# Patient Record
Sex: Male | Born: 1940 | Race: White | Hispanic: No | Smoking: Never smoker
Health system: Southern US, Community
[De-identification: ages and names within clinical notes are randomized; demographics above are authoritative.]

## PROBLEM LIST (undated history)

## (undated) DIAGNOSIS — R64 Cachexia: Secondary | ICD-10-CM

## (undated) DIAGNOSIS — I350 Nonrheumatic aortic (valve) stenosis: Secondary | ICD-10-CM

## (undated) DIAGNOSIS — R339 Retention of urine, unspecified: Secondary | ICD-10-CM

## (undated) DIAGNOSIS — E860 Dehydration: Secondary | ICD-10-CM

---

## 2000-07-12 ENCOUNTER — Emergency Department (HOSPITAL_COMMUNITY): Admission: EM | Admit: 2000-07-12 | Discharge: 2000-07-12 | Payer: Self-pay | Admitting: Emergency Medicine

## 2020-12-23 ENCOUNTER — Emergency Department (HOSPITAL_COMMUNITY)
Admission: EM | Admit: 2020-12-23 | Discharge: 2020-12-23 | Disposition: A | Discharge status: Home / Self Care | Payer: Self-pay | Attending: Emergency Medicine | Admitting: Emergency Medicine

## 2020-12-23 DIAGNOSIS — R404 Transient alteration of awareness: Secondary | ICD-10-CM | POA: Insufficient documentation

## 2020-12-23 DIAGNOSIS — R410 Disorientation, unspecified: Secondary | ICD-10-CM | POA: Insufficient documentation

## 2020-12-23 NOTE — ED Provider Notes (Signed)
[facility] EMERGENCY DEPARTMENT Provider Note   CSN: [id number] Arrival date & time: 12/23/20  1103     History Chief Complaint  Patient presents with   Altered Mental Status    Dominic Smith is a 80 y.o. male.  HPI Patient was brought here by EMS after he was found in the parking lot at a local store, confused, and looking for his car keys.  He states he lives alone, and drove to the [place] today.  He is not sure why he went there.  He denies headache, chest pain, shortness of breath, nausea, vomiting, weakness or dizziness.  There are no other known active modifying factors.    No past medical history on file.  There are no problems to display for this patient.     No family history on file.     Home Medications Prior to Admission medications   Not on File    Allergies    Patient has no allergy information on record.  Review of Systems   Review of Systems  All other systems reviewed and are negative.  Physical Exam Updated Vital Signs BP (!) 157/89   Pulse 89   Temp 98 F (36.7 C) (Oral)   Resp 11   SpO2 100%   Physical Exam Vitals and nursing note reviewed.  Constitutional:      General: He is not in acute distress.    Appearance: He is well-developed. He is not ill-appearing or diaphoretic.  HENT:     Head: Normocephalic and atraumatic.     Right Ear: External ear normal.     Left Ear: External ear normal.  Eyes:     Conjunctiva/sclera: Conjunctivae normal.     Pupils: Pupils are equal, round, and reactive to light.  Neck:     Trachea: Phonation normal.  Cardiovascular:     Rate and Rhythm: Normal rate and regular rhythm.     Heart sounds: Normal heart sounds.  Pulmonary:     Effort: Pulmonary effort is normal.     Breath sounds: Normal breath sounds.  Abdominal:     General: There is no distension.     Palpations: Abdomen is soft.     Tenderness: There is no abdominal tenderness.  Musculoskeletal:        General:  Normal range of motion.     Cervical back: Normal range of motion and neck supple.  Skin:    General: Skin is warm and dry.  Neurological:     Mental Status: He is alert and oriented to person, place, and time.     Cranial Nerves: No cranial nerve deficit.     Sensory: No sensory deficit.     Motor: No abnormal muscle tone.     Coordination: Coordination normal.     Comments: Alert, lucid, no dysarthria or aphasia.  Somewhat poor historian.  He could not recall the name of the store where he was when he was found by EMS.  With prompting he was able to remember the name.  Psychiatric:        Mood and Affect: Mood normal.        Behavior: Behavior normal.    ED Results / Procedures / Treatments   Labs (all labs ordered are listed, but only abnormal results are displayed) Labs Reviewed - No data to display  EKG EKG Interpretation  Date/Time:  Wednesday December 23 2020 11:15:53 EDT Ventricular Rate:  102 PR Interval:  207 QRS Duration: 92 QT  Interval:  350 QTC Calculation: 456 R Axis:   51 Text Interpretation: Sinus tachycardia Borderline prolonged PR interval No previous ECGs available Confirmed by Mancel Bale [phone]) on 12/23/2020 11:20:17 AM  Radiology No results found.  Procedures Procedures   Medications Ordered in ED Medications - No data to display  ED Course  I have reviewed the triage vital signs and the nursing notes.  Pertinent labs & imaging results that were available during my care of the patient were reviewed by me and considered in my medical decision making (see chart for details).    MDM Rules/Calculators/A&P                            Patient Vitals for the past 24 hrs:  BP Temp Temp src Pulse Resp SpO2  12/23/20 1345 (!) 157/89 98 F (36.7 C) Oral 89 11 100 %  12/23/20 1330 (!) 158/86 -- -- 88 13 100 %  12/23/20 1300 (!) 162/98 -- -- 94 19 100 %  12/23/20 1230 (!) 143/60 -- -- 91 16 100 %  12/23/20 1227 138/71 -- -- 93 20 100 %  12/23/20  1132 -- -- -- -- -- 100 %  12/23/20 1112 (!) 153/89 98 F (36.7 C) Oral (!) 101 19 100 %    1:54 PM Reevaluation with update and discussion. After initial assessment and treatment, an updated evaluation reveals status here now states the patient is at baseline.  Son is here now and states the patient is at his baseline.  Apparently, the patient locked his keys in the car which is why he could not find them.  Patient still does not have other concerns.  We discussed the findings and the plan. Mancel Bale   Medical Decision Making:  This patient is presenting for evaluation of a period of confusion, which does require a range of treatment options, and is a complaint that involves a moderate risk of morbidity and mortality. The differential diagnoses include early dementia, nonspecific confusional state, acute. I decided to review old records, and in summary elderly male, who does not have a PCP presents for confusion which is improving and mild in nature.  He did not require intervention by EMS, or on arrival to the ED.  Patient's son arrived and states he previously saw the patient get like this when he was outside in the heat.  Patient does have chronic ongoing medical problems or take medications.  I obtained additional historical information from son at the bedside.    Critical Interventions-clinical evaluation, nutrition offered and he was able to eat.  Discussion with son who arrived to take the patient home  After These Interventions, the Patient was reevaluated and was found stable, with transient confusion that resolved spontaneously.  Possible early dementia versus mild aging related confusion.  No indication for hospitalization at this time.  No evidence for unstable hemodynamic status or acute illness.  CRITICAL CARE-no Performed by: Mancel Bale  Nursing Notes Reviewed/ Care Coordinated Applicable Imaging Reviewed Interpretation of Laboratory Data incorporated into ED  treatment  The patient appears reasonably screened and/or stabilized for discharge and I doubt any other medical condition or other [facility] requiring further screening, evaluation, or treatment in the ED at this time prior to discharge.  Plan: Home Medications-OTC as needed; Home Treatments-regular diet, fluids and activity; return here if the recommended treatment, does not improve the symptoms; Recommended follow up-PCP, PR     Final Clinical Impression(s) /  ED Diagnoses Final diagnoses:  Transient alteration of awareness    Rx / DC Orders ED Discharge Orders     None        Mancel Bale, MD 12/23/20 1357

## 2020-12-23 NOTE — Discharge Instructions (Addendum)
There is not appear to be any sign of serious illness at this time.  Sometimes episodes like this can indicate the beginnings of cognitive decline.  Consider following up with a primary care doctor, for checkup and monitoring.  Otherwise try to eat regularly and drink plenty of fluids, and avoid being outside when it is hot.

## 2020-12-23 NOTE — ED Triage Notes (Signed)
Pt BIB EMS due to AMS. Pt was at [employer]  and was looking under his car for his keys. Bystander called 911. EMS called son and son stated that this is not his baseline.

## 2020-12-23 NOTE — ED Notes (Signed)
Son at bedside and has been updated.

## 2021-05-13 ENCOUNTER — Inpatient Hospital Stay (HOSPITAL_COMMUNITY)
Admission: EM | Admit: 2021-05-13 | Discharge: 2021-05-18 | DRG: 640 | Disposition: A | Discharge status: Home / Self Care | Payer: Medicare Other | Attending: Internal Medicine | Admitting: Internal Medicine

## 2021-05-13 ENCOUNTER — Other Ambulatory Visit: Payer: Self-pay

## 2021-05-13 DIAGNOSIS — Y92481 Parking lot as the place of occurrence of the external cause: Secondary | ICD-10-CM

## 2021-05-13 DIAGNOSIS — N179 Acute kidney failure, unspecified: Secondary | ICD-10-CM | POA: Diagnosis present

## 2021-05-13 DIAGNOSIS — B9620 Unspecified Escherichia coli [E. coli] as the cause of diseases classified elsewhere: Secondary | ICD-10-CM | POA: Diagnosis present

## 2021-05-13 DIAGNOSIS — N390 Urinary tract infection, site not specified: Secondary | ICD-10-CM | POA: Diagnosis present

## 2021-05-13 DIAGNOSIS — N3001 Acute cystitis with hematuria: Principal | ICD-10-CM

## 2021-05-13 DIAGNOSIS — S60512A Abrasion of left hand, initial encounter: Secondary | ICD-10-CM | POA: Diagnosis present

## 2021-05-13 DIAGNOSIS — R011 Cardiac murmur, unspecified: Secondary | ICD-10-CM | POA: Diagnosis present

## 2021-05-13 DIAGNOSIS — I350 Nonrheumatic aortic (valve) stenosis: Secondary | ICD-10-CM

## 2021-05-13 DIAGNOSIS — Z5902 Unsheltered homelessness: Secondary | ICD-10-CM

## 2021-05-13 DIAGNOSIS — N1832 Chronic kidney disease, stage 3b: Secondary | ICD-10-CM | POA: Diagnosis present

## 2021-05-13 DIAGNOSIS — R030 Elevated blood-pressure reading, without diagnosis of hypertension: Secondary | ICD-10-CM | POA: Diagnosis present

## 2021-05-13 DIAGNOSIS — R404 Transient alteration of awareness: Secondary | ICD-10-CM | POA: Diagnosis not present

## 2021-05-13 DIAGNOSIS — S0081XA Abrasion of other part of head, initial encounter: Secondary | ICD-10-CM | POA: Diagnosis present

## 2021-05-13 DIAGNOSIS — W1830XA Fall on same level, unspecified, initial encounter: Secondary | ICD-10-CM | POA: Diagnosis present

## 2021-05-13 DIAGNOSIS — E860 Dehydration: Secondary | ICD-10-CM | POA: Diagnosis present

## 2021-05-13 DIAGNOSIS — R52 Pain, unspecified: Secondary | ICD-10-CM

## 2021-05-13 DIAGNOSIS — G9341 Metabolic encephalopathy: Secondary | ICD-10-CM | POA: Diagnosis present

## 2021-05-13 DIAGNOSIS — Z20822 Contact with and (suspected) exposure to covid-19: Secondary | ICD-10-CM | POA: Diagnosis present

## 2021-05-13 DIAGNOSIS — Z79899 Other long term (current) drug therapy: Secondary | ICD-10-CM

## 2021-05-13 DIAGNOSIS — G9340 Encephalopathy, unspecified: Secondary | ICD-10-CM | POA: Diagnosis present

## 2021-05-13 NOTE — ED Triage Notes (Signed)
Pt BIB EMS c/o AMS. Per EMS pt was found in the floor at [employer] parking lot. Pt a/xo3.    BP 131 80   HR 70 RR 20 O2 99% on RA CBG 98

## 2021-05-13 NOTE — ED Notes (Signed)
Patient refused vital signs 

## 2021-05-13 NOTE — ED Notes (Signed)
Patient stated "I don't want to be here. That Officer and EMS just brought me here without reason, they take away my rights."

## 2021-05-13 NOTE — ED Provider Notes (Signed)
[facility] DEPT Provider Note   CSN: [id number] Arrival date & time: 05/13/21  2155     History  No chief complaint on file.   Dominic Smith is a 81 y.o. male.  HPI Patient is an 81 year old male who presents to the emergency department via EMS due to possible altered mental status.  Patient was found lying down in a [employer] parking lot.  There was concern that he was disoriented so they brought him to the emergency department.  He denies any physical complaints.  Denies any drug or alcohol use.  Denies any chest pain or shortness of breath.  No URI symptoms.  Ambulatory and requesting something to drink.  States that he is unsure why he was brought here and requests to go home.    Home Medications Prior to Admission medications   Not on File      Allergies    Patient has no allergy information on record.    Review of Systems   Review of Systems  All other systems reviewed and are negative. Ten systems reviewed and are negative for acute change, except as noted in the HPI.   Physical Exam Updated Vital Signs BP 138/76 (BP Location: Left Arm)    Pulse 93    Temp 97.6 F (36.4 C) (Oral)    Resp 16    Ht 6' (1.829 m)    Wt 72.6 kg    SpO2 98%    BMI 21.70 kg/m  Physical Exam Vitals and nursing note reviewed.  Constitutional:      General: He is not in acute distress.    Appearance: Normal appearance. He is not ill-appearing, toxic-appearing or diaphoretic.  HENT:     Head: Normocephalic.     Comments: Abrasion to the forehead.    Right Ear: External ear normal.     Left Ear: External ear normal.     Nose: Nose normal.     Mouth/Throat:     Mouth: Mucous membranes are moist.     Pharynx: Oropharynx is clear. No oropharyngeal exudate or posterior oropharyngeal erythema.  Eyes:     General: No scleral icterus.       Right eye: No discharge.        Left eye: No discharge.     Extraocular Movements: Extraocular movements intact.      Conjunctiva/sclera: Conjunctivae normal.  Cardiovascular:     Rate and Rhythm: Normal rate and regular rhythm.     Pulses: Normal pulses.     Heart sounds: Normal heart sounds. No murmur heard.   No friction rub. No gallop.     Comments: RRR without M/R/G. Pulmonary:     Effort: Pulmonary effort is normal. No respiratory distress.     Breath sounds: Normal breath sounds. No stridor. No wheezing, rhonchi or rales.     Comments: LCTAB. Abdominal:     General: Abdomen is flat.     Palpations: Abdomen is soft.     Tenderness: There is no abdominal tenderness.  Musculoskeletal:        General: Normal range of motion.     Cervical back: Normal range of motion and neck supple. No tenderness.  Skin:    General: Skin is warm and dry.  Neurological:     General: No focal deficit present.     Mental Status: He is alert.     Comments: A&O x1.  Unsure of location or the year.  Moving all 4 extremities.  No  gross deficits.  Ambulatory with a steady gait.  Psychiatric:        Mood and Affect: Mood normal.        Behavior: Behavior normal.   ED Results / Procedures / Treatments   Labs (all labs ordered are listed, but only abnormal results are displayed) Labs Reviewed  URINALYSIS, ROUTINE W REFLEX MICROSCOPIC - Abnormal; Notable for the following components:      Result Value   APPearance HAZY (*)    Hgb urine dipstick SMALL (*)    Ketones, ur 5 (*)    Nitrite POSITIVE (*)    Leukocytes,Ua LARGE (*)    WBC, UA >50 (*)    Bacteria, UA MANY (*)    All other components within normal limits  COMPREHENSIVE METABOLIC PANEL - Abnormal; Notable for the following components:   CO2 21 (*)    Glucose, Bld 103 (*)    BUN 51 (*)    Creatinine, Ser 1.47 (*)    AST 59 (*)    Total Bilirubin 1.3 (*)    GFR, Estimated 48 (*)    All other components within normal limits  RESP PANEL BY RT-PCR (FLU A&B, COVID) ARPGX2  URINE CULTURE  CBC WITH DIFFERENTIAL/PLATELET  RAPID URINE DRUG SCREEN, HOSP  PERFORMED   EKG None  Radiology CT HEAD WO CONTRAST (5MM)  Result Date: 05/14/2021 CLINICAL DATA:  Found on the ground at [employer] parking lot. EXAM: CT HEAD WITHOUT CONTRAST TECHNIQUE: Contiguous axial images were obtained from the base of the skull through the vertex without intravenous contrast. RADIATION DOSE REDUCTION: This exam was performed according to the departmental dose-optimization program which includes automated exposure control, adjustment of the mA and/or kV according to patient size and/or use of iterative reconstruction technique. COMPARISON:  None. FINDINGS: Brain: There is mild cerebral atrophy with widening of the extra-axial spaces and ventricular dilatation. There are areas of decreased attenuation within the white matter tracts of the supratentorial brain, consistent with microvascular disease changes. Vascular: No hyperdense vessel or unexpected calcification. Skull: Normal. Negative for fracture or focal lesion. Sinuses/Orbits: Chronic cortical thickening of the anterior, lateral and posterior walls of the left maxillary sinus are seen. Similar appearing changes are noted within the region of the frontal sinus on the left. Other: None. IMPRESSION: 1. No acute intracranial pathology. 2. 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 Electronically Signed   By: Virgina Norfolk M.D.   On: 05/14/2021 02:11   CT Cervical Spine Wo Contrast  Result Date: 05/14/2021 CLINICAL DATA:  Found on the ground at [employer] parking lot. EXAM: CT CERVICAL SPINE WITHOUT CONTRAST TECHNIQUE: Multidetector CT imaging of the cervical spine was performed without intravenous contrast. Multiplanar CT image reconstructions were also generated. RADIATION DOSE REDUCTION: This exam was performed according to the departmental dose-optimization program which includes automated exposure control, adjustment of the mA and/or kV according to patient size and/or use of iterative reconstruction  technique. COMPARISON:  None. FINDINGS: Alignment: There is approximately 1 mm retrolisthesis of the C3 vertebral body on C4. Skull base and vertebrae: No acute fracture. Chronic and degenerative changes are seen along the tip of the dens and dorsal aspect of the adjacent anterior arch of C1. No primary bone lesion or focal pathologic process. Soft tissues and spinal canal: No prevertebral fluid or swelling. No visible canal hematoma. Disc levels: Marked severity endplate sclerosis and mild to moderate severity anterior osteophyte formation are seen at the levels of C3-C4, C5-C6 and C6-C7. There  is marked severity narrowing of the anterior atlantoaxial articulation. Marked severity intervertebral disc space narrowing is seen at the levels of C3-C4, C5-C6 and C6-C7. Mild to moderate severity intervertebral disc space narrowing is noted at C2-C3. Bilateral marked severity multilevel facet joint hypertrophy is noted. Upper chest: Biapical scarring and/or atelectasis is seen, right greater than left. Other: None. IMPRESSION: 1. No acute fracture of the cervical spine. 2. Marked severity multilevel degenerative changes, as described above. Electronically Signed   By: Aram Candela M.D.   On: 05/14/2021 02:14   DG Chest Portable 1 View  Result Date: 05/14/2021 CLINICAL DATA:  Recent fall, initial encounter EXAM: PORTABLE CHEST 1 VIEW COMPARISON:  None. FINDINGS: The heart size and mediastinal contours are within normal limits. Both lungs are clear. The visualized skeletal structures are unremarkable. IMPRESSION: No active disease. Electronically Signed   By: Alcide Clever M.D.   On: 05/14/2021 01:39    Procedures Procedures   Medications Ordered in ED Medications  cefTRIAXone (ROCEPHIN) 1 g in sodium chloride 0.9 % 100 mL IVPB (has no administration in time range)    ED Course/ Medical Decision Making/ A&P Clinical Course as of 05/14/21 0504  Fri May 14, 2021  0123 Patient's son is now at bedside.  He  states that he last spoke to the patient about 2 days ago and he was not behaving this way.  He states that he is having difficulty obtaining a history from him which is very abnormal.  He states that he recently lost his home and has been living out of his [employer].  He states that the patient has his [employer] in the shop and due to this has been sleeping outside where he typically parks his [employer].  He states that the patient has no history of drug use or alcohol use. [LJ]    Clinical Course User Index [LJ] Placido Sou, PA-C                           Medical Decision Making Amount and/or Complexity of Data Reviewed Labs: ordered. Radiology: ordered.    Pt is a 81 y.o. male who presents to the emergency department due to altered mental status.  Patient's son provided most of the history.  Patient is currently A&O x1.  His son states that he recently lost his home and was living in his [employer].  He states that he was behaving normally 2 days ago when he spoke to him.  He did not realize that his father had also lost his [employer]iece and was sleeping in a parking lot where he typically parked it.  He states that his father has not typically this confused at baseline.  Labs: CBC without abnormalities. CMP with a CO2 21, glucose 103, BUN of 51, creatinine 1.47, AST of 59, total bilirubin 1.3, GFR 48. UA with small hemoglobin, 5 ketones, positive nitrites, large leukocytes, greater than 50 white blood cells, 6-10 red blood cells, many bacteria. Urine culture sent. Respiratory panel is negative. UDS is pending.  Imaging: Chest x-ray shows no active disease. CT scan of the cervical spine shows no acute fracture of the cervical spine.  Marked severity multilevel degenerative changes as described above. CT scan of the head without contrast shows no acute intracranial pathology.  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  I, Placido Sou, PA-C, personally reviewed and evaluated these images and lab  results as part of my medical decision-making.  Patient  has a abrasion to the left side of the forehead.  His son is unsure how this occurred.  Likely a fall.  Given his AMS CT scans were obtained of the head and cervical spine which are generally reassuring.  Chest x-ray is negative.  CBC without leukocytosis.  UA appears infectious.  Patient given a dose of IV Rocephin.  Urine culture obtained.  Given patient's age, altered mental status, as well as apparent UTI, feel that admission is warranted for IV antibiotics and further management.  We will discuss with the medicine team at this time.  Note: Portions of this report may have been transcribed using voice recognition software. Every effort was made to ensure accuracy; however, inadvertent computerized transcription errors may be present.   Final Clinical Impression(s) / ED Diagnoses Final diagnoses:  Acute cystitis with hematuria  Transient alteration of awareness    Rx / DC Orders ED Discharge Orders     None         Rayna Sexton, PA-C 05/14/21 0507    Palumbo, April, MD 05/14/21 [id number]    Randal Buba, April, MD 05/14/21 [id number]

## 2021-05-13 NOTE — ED Notes (Signed)
Patient refused VS

## 2021-05-14 ENCOUNTER — Emergency Department (HOSPITAL_COMMUNITY): Payer: Medicare Other

## 2021-05-14 ENCOUNTER — Encounter (HOSPITAL_COMMUNITY): Payer: Self-pay | Admitting: Internal Medicine

## 2021-05-14 ENCOUNTER — Observation Stay (HOSPITAL_COMMUNITY): Payer: Medicare Other

## 2021-05-14 DIAGNOSIS — G9340 Encephalopathy, unspecified: Secondary | ICD-10-CM

## 2021-05-14 DIAGNOSIS — G9341 Metabolic encephalopathy: Secondary | ICD-10-CM | POA: Diagnosis present

## 2021-05-14 LAB — CBC WITH DIFFERENTIAL/PLATELET
Abs Immature Granulocytes: 0.03 10*3/uL (ref 0.00–0.07)
Basophils Absolute: 0 10*3/uL (ref 0.0–0.1)
Basophils Relative: 0 %
Eosinophils Absolute: 0.1 10*3/uL (ref 0.0–0.5)
Eosinophils Relative: 2 %
HCT: 39.3 % (ref 39.0–52.0)
Hemoglobin: 13 g/dL (ref 13.0–17.0)
Immature Granulocytes: 0 %
Lymphocytes Relative: 15 %
Lymphs Abs: 1.4 10*3/uL (ref 0.7–4.0)
MCH: 29.5 pg (ref 26.0–34.0)
MCHC: 33.1 g/dL (ref 30.0–36.0)
MCV: 89.3 fL (ref 80.0–100.0)
Monocytes Absolute: 1 10*3/uL (ref 0.1–1.0)
Monocytes Relative: 11 %
Neutro Abs: 6.6 10*3/uL (ref 1.7–7.7)
Neutrophils Relative %: 72 %
Platelets: 230 10*3/uL (ref 150–400)
RBC: 4.4 MIL/uL (ref 4.22–5.81)
RDW: 13.6 % (ref 11.5–15.5)
WBC: 9.2 10*3/uL (ref 4.0–10.5)
nRBC: 0 % (ref 0.0–0.2)

## 2021-05-14 LAB — COMPREHENSIVE METABOLIC PANEL
ALT: 27 U/L (ref 0–44)
AST: 59 U/L — ABNORMAL HIGH (ref 15–41)
Albumin: 4.1 g/dL (ref 3.5–5.0)
Alkaline Phosphatase: 61 U/L (ref 38–126)
Anion gap: 9 (ref 5–15)
BUN: 51 mg/dL — ABNORMAL HIGH (ref 8–23)
CO2: 21 mmol/L — ABNORMAL LOW (ref 22–32)
Calcium: 9.2 mg/dL (ref 8.9–10.3)
Chloride: 109 mmol/L (ref 98–111)
Creatinine, Ser: 1.47 mg/dL — ABNORMAL HIGH (ref 0.61–1.24)
GFR, Estimated: 48 mL/min — ABNORMAL LOW (ref >60)
Glucose, Bld: 103 mg/dL — ABNORMAL HIGH (ref 70–99)
Potassium: 3.5 mmol/L (ref 3.5–5.1)
Sodium: 139 mmol/L (ref 135–145)
Total Bilirubin: 1.3 mg/dL — ABNORMAL HIGH (ref 0.3–1.2)
Total Protein: 7.1 g/dL (ref 6.5–8.1)

## 2021-05-14 LAB — URINALYSIS, ROUTINE W REFLEX MICROSCOPIC
Bilirubin Urine: NEGATIVE
Glucose, UA: NEGATIVE mg/dL
Ketones, ur: 5 mg/dL — AB
Nitrite: POSITIVE — AB
Protein, ur: NEGATIVE mg/dL
Specific Gravity, Urine: 1.02 (ref 1.005–1.030)
WBC, UA: >50 WBC/hpf — ABNORMAL HIGH (ref 0–5)
pH: 5 (ref 5.0–8.0)

## 2021-05-14 LAB — AMMONIA: Ammonia: 17 umol/L (ref 9–35)

## 2021-05-14 LAB — RAPID URINE DRUG SCREEN, HOSP PERFORMED
Amphetamines: NOT DETECTED
Barbiturates: NOT DETECTED
Benzodiazepines: NOT DETECTED
Cocaine: NOT DETECTED
Opiates: NOT DETECTED
Tetrahydrocannabinol: NOT DETECTED

## 2021-05-14 LAB — RESP PANEL BY RT-PCR (FLU A&B, COVID) ARPGX2
Influenza A by PCR: NEGATIVE
Influenza B by PCR: NEGATIVE
SARS Coronavirus 2 by RT PCR: NEGATIVE

## 2021-05-14 MED ORDER — SODIUM CHLORIDE 0.9 % IV SOLN
1.0000 g | Freq: Once | INTRAVENOUS | Status: AC
  Administered 2021-05-14: 1 g via INTRAVENOUS
  Filled 2021-05-14: qty 10

## 2021-05-14 MED ORDER — ONDANSETRON HCL 4 MG/2ML IJ SOLN
4.0000 mg | Freq: Four times a day (QID) | INTRAMUSCULAR | Status: DC | PRN
Start: 2021-05-14 — End: 2021-05-18

## 2021-05-14 MED ORDER — ADULT MULTIVITAMIN W/MINERALS CH
1.0000 | ORAL_TABLET | Freq: Every day | ORAL | Status: DC
  Administered 2021-05-14 – 2021-05-18 (×5): 1 via ORAL
  Filled 2021-05-14 (×5): qty 1

## 2021-05-14 MED ORDER — ACETAMINOPHEN 650 MG RE SUPP: 650.0000 mg | Freq: Four times a day (QID) | RECTAL | Status: DC | PRN

## 2021-05-14 MED ORDER — HYDROCODONE-ACETAMINOPHEN 5-325 MG PO TABS: 1.0000 | ORAL_TABLET | ORAL | Status: DC | PRN

## 2021-05-14 MED ORDER — SODIUM CHLORIDE 0.9 % IV SOLN
1.0000 g | INTRAVENOUS | Status: DC
  Administered 2021-05-15: 1 g via INTRAVENOUS
  Filled 2021-05-14: qty 10

## 2021-05-14 MED ORDER — ACETAMINOPHEN 325 MG PO TABS
650.0000 mg | ORAL_TABLET | Freq: Four times a day (QID) | ORAL | Status: DC | PRN
  Administered 2021-05-17: 650 mg via ORAL
  Filled 2021-05-14: qty 2

## 2021-05-14 MED ORDER — SODIUM CHLORIDE 0.9 % IV SOLN: Freq: Once | INTRAVENOUS | Status: AC

## 2021-05-14 MED ORDER — ONDANSETRON HCL 4 MG PO TABS: 4.0000 mg | ORAL_TABLET | Freq: Four times a day (QID) | ORAL | Status: DC | PRN

## 2021-05-14 NOTE — ED Notes (Signed)
Dinner tray provided
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
## 2021-05-14 NOTE — ED Notes (Signed)
Lunch provided.

## 2021-05-14 NOTE — ED Notes (Signed)
Pt out of bed using urinal. Pt had pulled off his cardiac monitoring leads and IV removed. Pt reoriented to bed and placed on bed alarm.

## 2021-05-14 NOTE — ED Notes (Signed)
[name] (son) - [phone]

## 2021-05-15 ENCOUNTER — Inpatient Hospital Stay (HOSPITAL_COMMUNITY): Payer: Medicare Other

## 2021-05-15 DIAGNOSIS — N179 Acute kidney failure, unspecified: Secondary | ICD-10-CM | POA: Diagnosis present

## 2021-05-15 DIAGNOSIS — S60512A Abrasion of left hand, initial encounter: Secondary | ICD-10-CM | POA: Diagnosis present

## 2021-05-15 DIAGNOSIS — Y92481 Parking lot as the place of occurrence of the external cause: Secondary | ICD-10-CM | POA: Diagnosis not present

## 2021-05-15 DIAGNOSIS — E860 Dehydration: Secondary | ICD-10-CM | POA: Diagnosis present

## 2021-05-15 DIAGNOSIS — G9341 Metabolic encephalopathy: Secondary | ICD-10-CM

## 2021-05-15 DIAGNOSIS — I350 Nonrheumatic aortic (valve) stenosis: Secondary | ICD-10-CM | POA: Diagnosis present

## 2021-05-15 DIAGNOSIS — N390 Urinary tract infection, site not specified: Secondary | ICD-10-CM | POA: Diagnosis present

## 2021-05-15 DIAGNOSIS — R030 Elevated blood-pressure reading, without diagnosis of hypertension: Secondary | ICD-10-CM | POA: Diagnosis present

## 2021-05-15 DIAGNOSIS — R011 Cardiac murmur, unspecified: Secondary | ICD-10-CM | POA: Diagnosis present

## 2021-05-15 DIAGNOSIS — N3001 Acute cystitis with hematuria: Secondary | ICD-10-CM | POA: Diagnosis present

## 2021-05-15 DIAGNOSIS — Z79899 Other long term (current) drug therapy: Secondary | ICD-10-CM | POA: Diagnosis not present

## 2021-05-15 DIAGNOSIS — R404 Transient alteration of awareness: Secondary | ICD-10-CM | POA: Diagnosis present

## 2021-05-15 DIAGNOSIS — Z20822 Contact with and (suspected) exposure to covid-19: Secondary | ICD-10-CM | POA: Diagnosis present

## 2021-05-15 DIAGNOSIS — B9620 Unspecified Escherichia coli [E. coli] as the cause of diseases classified elsewhere: Secondary | ICD-10-CM | POA: Diagnosis present

## 2021-05-15 DIAGNOSIS — Z5902 Unsheltered homelessness: Secondary | ICD-10-CM | POA: Diagnosis not present

## 2021-05-15 DIAGNOSIS — W1830XA Fall on same level, unspecified, initial encounter: Secondary | ICD-10-CM | POA: Diagnosis present

## 2021-05-15 DIAGNOSIS — S0081XA Abrasion of other part of head, initial encounter: Secondary | ICD-10-CM | POA: Diagnosis present

## 2021-05-15 LAB — ECHOCARDIOGRAM COMPLETE
AR max vel: 1.32 cm2
AV Area VTI: 1.26 cm2
AV Area mean vel: 1.35 cm2
AV Mean grad: 11 mmHg
AV Peak grad: 19.4 mmHg
Ao pk vel: 2.2 m/s
Area-P 1/2: 2.67 cm2
Height: 72 in
S' Lateral: 2.4 cm
Weight: 2560 oz

## 2021-05-15 LAB — COMPREHENSIVE METABOLIC PANEL
ALT: 21 U/L (ref 0–44)
AST: 32 U/L (ref 15–41)
Albumin: 3.3 g/dL — ABNORMAL LOW (ref 3.5–5.0)
Alkaline Phosphatase: 57 U/L (ref 38–126)
Anion gap: 7 (ref 5–15)
BUN: 34 mg/dL — ABNORMAL HIGH (ref 8–23)
CO2: 24 mmol/L (ref 22–32)
Calcium: 8.8 mg/dL — ABNORMAL LOW (ref 8.9–10.3)
Chloride: 112 mmol/L — ABNORMAL HIGH (ref 98–111)
Creatinine, Ser: 1.01 mg/dL (ref 0.61–1.24)
GFR, Estimated: >60 mL/min (ref >60)
Glucose, Bld: 90 mg/dL (ref 70–99)
Potassium: 3.5 mmol/L (ref 3.5–5.1)
Sodium: 143 mmol/L (ref 135–145)
Total Bilirubin: 1.3 mg/dL — ABNORMAL HIGH (ref 0.3–1.2)
Total Protein: 5.8 g/dL — ABNORMAL LOW (ref 6.5–8.1)

## 2021-05-15 LAB — MAGNESIUM: Magnesium: 2.3 mg/dL (ref 1.7–2.4)

## 2021-05-15 LAB — CBC
HCT: 36.5 % — ABNORMAL LOW (ref 39.0–52.0)
Hemoglobin: 12.1 g/dL — ABNORMAL LOW (ref 13.0–17.0)
MCH: 30.3 pg (ref 26.0–34.0)
MCHC: 33.2 g/dL (ref 30.0–36.0)
MCV: 91.5 fL (ref 80.0–100.0)
Platelets: 188 10*3/uL (ref 150–400)
RBC: 3.99 MIL/uL — ABNORMAL LOW (ref 4.22–5.81)
RDW: 13.7 % (ref 11.5–15.5)
WBC: 6.3 10*3/uL (ref 4.0–10.5)
nRBC: 0 % (ref 0.0–0.2)

## 2021-05-15 MED ORDER — SODIUM CHLORIDE 0.9 % IV SOLN: INTRAVENOUS | Status: DC

## 2021-05-15 MED ORDER — SODIUM CHLORIDE 0.45 % IV SOLN: INTRAVENOUS | Status: DC

## 2021-05-15 MED ORDER — CARVEDILOL 3.125 MG PO TABS
3.1250 mg | ORAL_TABLET | Freq: Two times a day (BID) | ORAL | Status: DC
  Administered 2021-05-15 – 2021-05-18 (×6): 3.125 mg via ORAL
  Filled 2021-05-15 (×6): qty 1

## 2021-05-15 MED ORDER — SODIUM CHLORIDE 0.9 % IV SOLN
2.0000 g | INTRAVENOUS | Status: DC
  Administered 2021-05-16: 2 g via INTRAVENOUS
  Filled 2021-05-15: qty 20

## 2021-05-15 MED ORDER — ENOXAPARIN SODIUM 40 MG/0.4ML IJ SOSY
40.0000 mg | PREFILLED_SYRINGE | INTRAMUSCULAR | Status: DC
  Administered 2021-05-15 – 2021-05-17 (×3): 40 mg via SUBCUTANEOUS
  Filled 2021-05-15 (×3): qty 0.4

## 2021-05-15 MED ORDER — ENOXAPARIN SODIUM 30 MG/0.3ML IJ SOSY: 30.0000 mg | PREFILLED_SYRINGE | INTRAMUSCULAR | Status: DC

## 2021-05-15 MED ORDER — POTASSIUM CHLORIDE CRYS ER 10 MEQ PO TBCR
40.0000 meq | EXTENDED_RELEASE_TABLET | Freq: Once | ORAL | Status: AC
Start: 2021-05-15 — End: 2021-05-15
  Administered 2021-05-15: 40 meq via ORAL
  Filled 2021-05-15: qty 4

## 2021-05-15 MED ORDER — SENNOSIDES-DOCUSATE SODIUM 8.6-50 MG PO TABS
1.0000 | ORAL_TABLET | Freq: Two times a day (BID) | ORAL | Status: DC
  Administered 2021-05-15 – 2021-05-17 (×5): 1 via ORAL
  Filled 2021-05-15 (×6): qty 1

## 2021-05-15 NOTE — Plan of Care (Signed)

## 2021-05-15 NOTE — Progress Notes (Signed)
PROGRESS NOTE    Dominic Smith  TKW:[id number] DOB: 06-28-1940 DOA: 05/13/2021 PCP: Pcp, No    No chief complaint on file.   Brief Narrative:  Patient is 81 year old gentleman with no significant past medical history presented with altered mental status.  Per admitting physician patient unaware of events that led him here noted to apparently being found down in the [employer] parking lot where he had stated he was trying to do something with his car and fell down on a gravel hill noted to have abrasions on his hand and left forehead.  Patient seen in the ED noted to be in acute kidney injury, ammonia and UDS were negative.  Urinalysis concerning for UTI.  Patient placed empirically on IV Rocephin and admitted for further evaluation and management.   Assessment & Plan:   Principal Problem:   Acute metabolic encephalopathy Active Problems:   AKI (acute kidney injury) (HCC)   Acute lower UTI   Dehydration   Heart murmur   Mild aortic valve stenosis: Mild to moderate aortic valve stenosis per 2D echo 05/15/2021  #1 acute metabolic encephalopathy -Likely multifactorial secondary to dehydration, UTI. -Slowly improving clinically. -Chest x-ray with no acute abnormalities. -Urine cultures with > 100,000 colonies of E. coli with sensitivities pending. -Continue IV Rocephin, IV fluids, supportive care.  2.  E. coli UTI -Urinalysis consistent with UTI. -Urine cultures with >100,000 colonies of E. coli.  Sensitivities pending. -Continue IV Rocephin.  3.  Dehydration -IV fluids.  4.  Acute kidney injury -Likely secondary to prerenal azotemia in the setting of UTI. -Renal ultrasound negative for hydronephrosis with probable layered dependent debris within the urinary bladder. -Renal function improved with hydration. -IV fluids, supportive care. -Avoid nephrotoxins.  5.  Heart murmur/mild to moderate aortic valve stenosis -Patient noted to have a heart murmur on examination and 2D  echo ordered with normal EF, grade 1 diastolic dysfunction,NWMA, mild to moderate aortic valve stenosis. -Start low-dose beta-blocker. -Will likely need outpatient follow-up with cardiology.    DVT prophylaxis: Lovenox Code Status: Full Family Communication: Updated patient and son at bedside. Disposition:   Status is: Inpatient  Remains inpatient appropriate because: Severity of illness       Consultants:  None  Procedures:  CT head CT C-spine 05/14/2021 Chest x-ray 05/14/2021 Renal ultrasound 05/14/2021 2D echo 05/15/2021    Antimicrobials:  IV Rocephin 05/14/2021 >>>>>   Subjective: Patient sitting up in chair.  Alert to self, knows he is in [place] in [place].  Knows he is in the hospital but unsure of the name.  Knows who the president is, Biden.  Not sure of the year or the month.  Denies any chest pain.  No shortness of breath.  No abdominal pain.  Son at bedside who feels patient's mentation is improving slowly since admission  Objective: Vitals:   05/14/21 2202 05/15/21 0234 05/15/21 0607 05/15/21 1624  BP: (!) 157/90 (!) 144/76 (!) 136/93 (!) 142/78  Pulse: 79 76 74 73  Resp: 17 16 16 20   Temp: (!) 97.5 F (36.4 C) (!) 97.4 F (36.3 C) (!) 97.5 F (36.4 C) 98.7 F (37.1 C)  TempSrc: Oral Oral    SpO2: 100% 100% 100% 100%  Weight:      Height:        Intake/Output Summary (Last 24 hours) at 05/15/2021 1640 Last data filed at 05/14/2021 1707 Gross per 24 hour  Intake 600 ml  Output --  Net 600 ml   05/16/2021  05/13/21 2235  Weight: 72.6 kg    Examination:  General exam: NAD.  Dry mucous membranes. Respiratory system: Clear to auscultation bilaterally, no wheezes, no crackles, no rhonchi.Marland Kitchen Respiratory effort normal. Cardiovascular system: RRR. 3/6 SEM.  No JVD, murmurs, rubs, gallops or clicks. No pedal edema. Gastrointestinal system: Abdomen is nondistended, soft and nontender. No organomegaly or masses felt. Normal bowel sounds  heard. Central nervous system: Alert and oriented. No focal neurological deficits. Extremities: Symmetric 5 x 5 power. Skin: No rashes, lesions or ulcers Psychiatry: Judgement and insight appear fair. Mood & affect appropriate.     Data Reviewed: I have personally reviewed following labs and imaging studies  CBC: Recent Labs  Lab 05/14/21 0133 05/15/21 0517  WBC 9.2 6.3  NEUTROABS 6.6  --   HGB 13.0 12.1*  HCT 39.3 36.5*  MCV 89.3 91.5  PLT 230 188    Basic Metabolic Panel: Recent Labs  Lab 05/14/21 0133 05/15/21 0517  NA 139 143  K 3.5 3.5  CL 109 112*  CO2 21* 24  GLUCOSE 103* 90  BUN 51* 34*  CREATININE 1.47* 1.01  CALCIUM 9.2 8.8*  MG  --  2.3    GFR: Estimated Creatinine Clearance: 58.9 mL/min (by C-G formula based on SCr of 1.01 mg/dL).  Liver Function Tests: Recent Labs  Lab 05/14/21 0133 05/15/21 0517  AST 59* 32  ALT 27 21  ALKPHOS 61 57  BILITOT 1.3* 1.3*  PROT 7.1 5.8*  ALBUMIN 4.1 3.3*    CBG: No results for input(s): GLUCAP in the last 168 hours.   Recent Results (from the past 240 hour(s))  Resp Panel by RT-PCR (Flu A&B, Covid) Nasopharyngeal Swab     Status: None   Collection Time: 05/14/21  1:33 AM   Specimen: Nasopharyngeal Swab; Nasopharyngeal(NP) swabs in vial transport medium  Result Value Ref Range Status   SARS Coronavirus 2 by RT PCR NEGATIVE NEGATIVE Final    Comment: (NOTE) SARS-CoV-2 target nucleic acids are NOT DETECTED.  The SARS-CoV-2 RNA is generally detectable in upper respiratory specimens during the acute phase of infection. The lowest concentration of SARS-CoV-2 viral copies this assay can detect is 138 copies/mL. A negative result does not preclude SARS-Cov-2 infection and should not be used as the sole basis for treatment or other patient management decisions. A negative result may occur with  improper specimen collection/handling, submission of specimen other than nasopharyngeal swab, presence of viral  mutation(s) within the areas targeted by this assay, and inadequate number of viral copies(<138 copies/mL). A negative result must be combined with clinical observations, patient history, and epidemiological information. The expected result is Negative.  Fact Sheet for Patients:  BloggerCourse.com  Fact Sheet for Healthcare Providers:  SeriousBroker.it  This test is no t yet approved or cleared by the [place] FDA and  has been authorized for detection and/or diagnosis of SARS-CoV-2 by FDA under an Emergency Use Authorization (EUA). This EUA will remain  in effect (meaning this test can be used) for the duration of the COVID-19 declaration under Section 564(b)(1) of the Act, 21 U.S.C.section 360bbb-3(b)(1), unless the authorization is terminated  or revoked sooner.       Influenza A by PCR NEGATIVE NEGATIVE Final   Influenza B by PCR NEGATIVE NEGATIVE Final    Comment: (NOTE) The Xpert Xpress SARS-CoV-2/FLU/RSV plus assay is intended as an aid in the diagnosis of influenza from Nasopharyngeal swab specimens and should not be used as a sole basis for treatment. Nasal washings  and aspirates are unacceptable for Xpert Xpress SARS-CoV-2/FLU/RSV testing.  Fact Sheet for Patients: BloggerCourse.com  Fact Sheet for Healthcare Providers: SeriousBroker.it  This test is not yet approved or cleared by the [place] FDA and has been authorized for detection and/or diagnosis of SARS-CoV-2 by FDA under an Emergency Use Authorization (EUA). This EUA will remain in effect (meaning this test can be used) for the duration of the COVID-19 declaration under Section 564(b)(1) of the Act, 21 U.S.C. section 360bbb-3(b)(1), unless the authorization is terminated or revoked.  Performed at [facility], [address], [place], [place] [address]   Urine Culture      Status: Abnormal (Preliminary result)   Collection Time: 05/14/21  4:32 AM   Specimen: Urine, Clean Catch  Result Value Ref Range Status   Specimen Description   Final    URINE, CLEAN CATCH Performed at [facility], [address] [place], [place] [address]    Special Requests   Final    NONE Performed at [facility], [address], [place], [place] [address]    Culture (A)  Final    >=100,000 COLONIES/mL ESCHERICHIA COLI SUSCEPTIBILITIES TO FOLLOW Performed at [facility], [address], [place], [place] [address]    Report Status PENDING  Incomplete         Radiology Studies: CT HEAD WO CONTRAST ( )  Result Date: 05/14/2021 CLINICAL DATA:  Found on the ground at [employer] parking lot. EXAM: CT HEAD WITHOUT CONTRAST TECHNIQUE: Contiguous axial images were obtained from the base of the skull through the vertex without intravenous contrast. RADIATION DOSE REDUCTION: This exam was performed according to the departmental dose-optimization program which includes automated exposure control, adjustment of the mA and/or kV according to patient size and/or use of iterative reconstruction technique. COMPARISON:  None. FINDINGS: Brain: There is mild cerebral atrophy with widening of the extra-axial spaces and ventricular dilatation. There are areas of decreased attenuation within the white matter tracts of the supratentorial brain, consistent with microvascular disease changes. Vascular: No hyperdense vessel or unexpected calcification. Skull: Normal. Negative for fracture or focal lesion. Sinuses/Orbits: Chronic cortical thickening of the anterior, lateral and posterior walls of the left maxillary sinus are seen. Similar appearing changes are noted within the region of the frontal sinus on the left. Other: None. IMPRESSION: 1. No acute intracranial pathology. 2. 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 Electronically Signed   By:  Aram Candela M.D.   On: 05/14/2021 02:11   CT Cervical Spine Wo Contrast  Result Date: 05/14/2021 CLINICAL DATA:  Found on the ground at [employer] parking lot. EXAM: CT CERVICAL SPINE WITHOUT CONTRAST TECHNIQUE: Multidetector CT imaging of the cervical spine was performed without intravenous contrast. Multiplanar CT image reconstructions were also generated. RADIATION DOSE REDUCTION: This exam was performed according to the departmental dose-optimization program which includes automated exposure control, adjustment of the mA and/or kV according to patient size and/or use of iterative reconstruction technique. COMPARISON:  None. FINDINGS: Alignment: There is approximately 1 mm retrolisthesis of the C3 vertebral body on C4. Skull base and vertebrae: No acute fracture. Chronic and degenerative changes are seen along the tip of the dens and dorsal aspect of the adjacent anterior arch of C1. No primary bone lesion or focal pathologic process. Soft tissues and spinal canal: No prevertebral fluid or swelling. No visible canal hematoma. Disc levels: Marked severity endplate sclerosis and mild to moderate severity anterior osteophyte formation are  seen at the levels of C3-C4, C5-C6 and C6-C7. There is marked severity narrowing of the anterior atlantoaxial articulation. Marked severity intervertebral disc space narrowing is seen at the levels of C3-C4, C5-C6 and C6-C7. Mild to moderate severity intervertebral disc space narrowing is noted at C2-C3. Bilateral marked severity multilevel facet joint hypertrophy is noted. Upper chest: Biapical scarring and/or atelectasis is seen, right greater than left. Other: None. IMPRESSION: 1. No acute fracture of the cervical spine. 2. Marked severity multilevel degenerative changes, as described above. Electronically Signed   By: Aram Candelahaddeus  Houston M.D.   On: 05/14/2021 02:14   US RENAL  Result Date: 05/14/2021 CLINICAL DATA:  Acute kidney injury EXAM: RENAL / URINARY TRACT  ULTRASOUND COMPLETE COMPARISON:  None FINDINGS: Right Kidney: Renal measurements: 10.0 x 4.6 x 4.8 cm = volume: 116 mL. Normal cortical thickness with upper normal cortical echogenicity. No mass, hydronephrosis, or shadowing calcification. Left Kidney: Renal measurements: 10.4 x 4.6 x 5.3 cm = volume: 132 mL. Normal cortical thickness and echogenicity. No mass, hydronephrosis, or shadowing calcification. Bladder: Dependent echogenic material within the bladder, likely layered debris. No definite mass or wall thickening. Other: N/A IMPRESSION: Probable layered dependent debris within urinary bladder. Otherwise negative exam. Electronically Signed   By: Ulyses SouthwardMark  Boles M.D.   On: 05/14/2021 09:01   DG Chest Portable 1 View  Result Date: 05/14/2021 CLINICAL DATA:  Recent fall, initial encounter EXAM: PORTABLE CHEST 1 VIEW COMPARISON:  None. FINDINGS: The heart size and mediastinal contours are within normal limits. Both lungs are clear. The visualized skeletal structures are unremarkable. IMPRESSION: No active disease. Electronically Signed   By: Alcide CleverMark  Lukens M.D.   On: 05/14/2021 01:39   ECHOCARDIOGRAM COMPLETE  Result Date: 05/15/2021    ECHOCARDIOGRAM REPORT   Patient Name:   Leonia ReaderRICHARD Schoppe Date of Exam: 05/15/2021 Medical Rec #:  [id number]       Height:       72.0 in Accession #:    [id number]      Weight:       160.0 lb Date of Birth:  10-23-1940       BSA:          1.938 m Patient Age:    80 years        BP:           136/93 mmHg Patient Gender: M               HR:           83 bpm. Exam Location:  Inpatient Procedure: 2D Echo, Cardiac Doppler and Color Doppler Indications:    Murmur R01.1  History:        Patient has no prior history of Echocardiogram examinations.  Sonographer:    Roosvelt Maserachel Lane RDCS Referring Phys: [id number] Rivaan Kendall V Zaina Jenkin IMPRESSIONS  1. Left ventricular ejection fraction, by estimation, is 60 to 65%. The left ventricle has normal function. The left ventricle has no regional wall motion  abnormalities. There is mild left ventricular hypertrophy. Left ventricular diastolic parameters are consistent with Grade I diastolic dysfunction (impaired relaxation).  2. Right ventricular systolic function is normal. The right ventricular size is normal. There is normal pulmonary artery systolic pressure.  3. Left atrial size was moderately dilated.  4. The mitral valve is degenerative. Trivial mitral valve regurgitation. No evidence of mitral stenosis.  5. The aortic valve is calcified. There is moderate calcification of the aortic valve. There is moderate thickening of the aortic valve. Aortic  valve regurgitation is not visualized. Mild to moderate aortic valve stenosis.  6. The inferior vena cava is normal in size with greater than 50% respiratory variability, suggesting right atrial pressure of 3 mmHg. FINDINGS  Left Ventricle: Left ventricular ejection fraction, by estimation, is 60 to 65%. The left ventricle has normal function. The left ventricle has no regional wall motion abnormalities. The left ventricular internal cavity size was normal in size. There is  mild left ventricular hypertrophy. Left ventricular diastolic parameters are consistent with Grade I diastolic dysfunction (impaired relaxation). Right Ventricle: The right ventricular size is normal. No increase in right ventricular wall thickness. Right ventricular systolic function is normal. There is normal pulmonary artery systolic pressure. The tricuspid regurgitant velocity is 2.41 m/s, and  with an assumed right atrial pressure of 3 mmHg, the estimated right ventricular systolic pressure is 26.2 mmHg. Left Atrium: Left atrial size was moderately dilated. Right Atrium: Right atrial size was normal in size. Pericardium: There is no evidence of pericardial effusion. Mitral Valve: The mitral valve is degenerative in appearance. There is mild thickening of the mitral valve leaflet(s). There is mild calcification of the mitral valve leaflet(s).  Mild mitral annular calcification. Trivial mitral valve regurgitation. No evidence of mitral valve stenosis. Tricuspid Valve: The tricuspid valve is normal in structure. Tricuspid valve regurgitation is trivial. No evidence of tricuspid stenosis. Aortic Valve: The aortic valve is calcified. There is moderate calcification of the aortic valve. There is moderate thickening of the aortic valve. Aortic valve regurgitation is not visualized. Mild to moderate aortic stenosis is present. Aortic valve mean gradient measures 11.0 mmHg. Aortic valve peak gradient measures 19.4 mmHg. Aortic valve area, by VTI measures 1.26 cm. Pulmonic Valve: The pulmonic valve was normal in structure. Pulmonic valve regurgitation is mild. No evidence of pulmonic stenosis. Aorta: The aortic root is normal in size and structure. Venous: The inferior vena cava is normal in size with greater than 50% respiratory variability, suggesting right atrial pressure of 3 mmHg. IAS/Shunts: No atrial level shunt detected by color flow Doppler.  LEFT VENTRICLE PLAX 2D LVIDd:         3.60 cm   Diastology LVIDs:         2.40 cm   LV e' medial:    8.59 cm/s LV PW:         1.50 cm   LV E/e' medial:  11.4 LV IVS:        1.20 cm   LV e' lateral:   10.40 cm/s LVOT diam:     2.00 cm   LV E/e' lateral: 9.4 LV SV:         62 LV SV Index:   32 LVOT Area:     3.14 cm  RIGHT VENTRICLE          IVC RV Basal diam:  3.70 cm  IVC diam: 2.20 cm LEFT ATRIUM             Index        RIGHT ATRIUM           Index LA diam:        4.60 cm 2.37 cm/m   RA Area:     18.00 cm LA Vol (A2C):   73.9 ml 38.14 ml/m  RA Volume:   51.50 ml  26.58 ml/m LA Vol (A4C):   58.2 ml 30.03 ml/m LA Biplane Vol: 66.3 ml 34.21 ml/m  AORTIC VALVE AV Area (Vmax):    1.32 cm AV  Area (Vmean):   1.35 cm AV Area (VTI):     1.26 cm AV Vmax:           220.00 cm/s AV Vmean:          154.000 cm/s AV VTI:            0.494 m AV Peak Grad:      19.4 mmHg AV Mean Grad:      11.0 mmHg LVOT Vmax:          92.70 cm/s LVOT Vmean:        66.300 cm/s LVOT VTI:          0.198 m LVOT/AV VTI ratio: 0.40  AORTA Ao Root diam: 3.50 cm MITRAL VALVE                TRICUSPID VALVE MV Area (PHT): 2.67 cm     TR Peak grad:   23.2 mmHg MV Decel Time: 284 msec     TR Vmax:        241.00 cm/s MV E velocity: 98.10 cm/s MV A velocity: 118.00 cm/s  SHUNTS MV E/A ratio:  0.83         Systemic VTI:  0.20 m                             Systemic Diam: 2.00 cm Charlton Haws MD Electronically signed by Charlton Haws MD Signature Date/Time: 05/15/2021/1:49:30 PM    Final         Scheduled Meds:  enoxaparin (LOVENOX) injection  30 mg Subcutaneous Q24H   multivitamin with minerals  1 tablet Oral Daily   senna-docusate  1 tablet Oral BID   Continuous Infusions:  sodium chloride 75 mL/hr at 05/15/21 1050   cefTRIAXone (ROCEPHIN)  IV 1 g (05/15/21 1053)     LOS: 0 days    Time spent: 40 minutes    Ramiro Harvest, MD [facility]   To contact the attending provider between 7A-7P or the covering provider during after hours 7P-7A, please log into the web site [email or web address] and access using universal [place] password for that web site. If you do not have the password, please call the hospital operator.  05/15/2021, 4:40 PM

## 2021-05-15 NOTE — Evaluation (Signed)
Occupational Therapy Evaluation Patient Details Name: Dominic Smith MRN: [id number] DOB: [birth year] Today's Date: 05/15/2021   History of Present Illness Patient is a 81 year old male who presented to the hospital after being found on ground in parkinglot. patient was admitted to the hosptial with UTI. no PMH listed in chart.   Clinical Impression   Patient evaluated by Occupational Therapy with no further acute OT needs identified. All education has been completed and the patient has no further questions. Patient was supervision for ADL tasks in room on this date with patient able to complete bathing tasks in standing with HR increasing to 113 bpm. With patients orientation level would recommend more assist with medication management in next level of care to ensure carryover of hospital recommendations.Patient endorses being at baseline at this time.  See below for any follow-up Occupational Therapy or equipment needs. OT is signing off. Thank you for this referral.       Recommendations for follow up therapy are one component of a multi-disciplinary discharge planning process, led by the attending physician.  Recommendations may be updated based on patient status, additional functional criteria and insurance authorization.   Follow Up Recommendations  No OT follow up    Assistance Recommended at Discharge Intermittent Supervision/Assistance  Patient can return home with the following Assistance with cooking/housework;Direct supervision/assist for financial management;Direct supervision/assist for medications management;Assist for transportation    Functional Status Assessment  Patient has had a recent decline in their functional status and demonstrates the ability to make significant improvements in function in a reasonable and predictable amount of time.  Equipment Recommendations  None recommended by OT    Recommendations for Other Services       Precautions / Restrictions  Precautions Precautions: Fall Restrictions Weight Bearing Restrictions: No      Mobility Bed Mobility               General bed mobility comments: in recliner       Balance Overall balance assessment: No apparent balance deficits (not formally assessed)               ADL either performed or assessed with clinical judgement   ADL Overall ADL's : At baseline      patient was able to complete ADLs standing at sink in bathroom with no LOB. patient was able to complete ADLs with no AD and fucntional mobiltiy in room. patient was noted to have one moment of unsteadiness when moving through doorway with IV pole infront but able to self correct. patient and son present at time of eval. patient endorses that he is at baseline at this time. patient and son were educated on having some SUP at time of d/c to ensure carryover from hospital. patient and son verbalized                Vision Patient Visual Report: No change from baseline       Perception     Praxis      Pertinent Vitals/Pain Pain Assessment Pain Assessment: No/denies pain     Hand Dominance Right   Extremity/Trunk Assessment Upper Extremity Assessment Upper Extremity Assessment: Overall WFL for tasks assessed (patient noted to have eschar on posterior L elbow but no pain noted with full ROM.)   Lower Extremity Assessment Lower Extremity Assessment: Defer to PT evaluation   Cervical / Trunk Assessment Cervical / Trunk Assessment: Normal   Communication Communication Communication: No difficulties   Cognition Arousal/Alertness: Awake/alert Behavior During Therapy:  WFL for tasks assessed/performed Overall Cognitive Status: Within Functional Limits for tasks assessed                                 General Comments: oriented to self and situation. appropriate with other tasks     General Comments       Exercises     Shoulder Instructions      Home Living Family/patient  expects to be discharged to:: Private residence Living Arrangements: Alone                           Home Equipment: None   Additional Comments: patients admission record stated patient was living in car. patients son who was present during evaluation reported patient would be going with him at time of d/c on fishing trip.      Prior Functioning/Environment Prior Level of Function : Independent/Modified Independent                        OT Problem List: Decreased activity tolerance      OT Treatment/Interventions:      OT Goals(Current goals can be found in the care plan section) Acute Rehab OT Goals OT Goal Formulation: All assessment and education complete, DC therapy  OT Frequency:      Co-evaluation              AM-PAC OT "6 Clicks" Daily Activity     Outcome Measure Help from another person eating meals?: None Help from another person taking care of personal grooming?: None Help from another person toileting, which includes using toliet, bedpan, or urinal?: A Little Help from another person bathing (including washing, rinsing, drying)?: A Little (SUP) Help from another person to put on and taking off regular upper body clothing?: A Little Help from another person to put on and taking off regular lower body clothing?: A Little 6 Click Score: 20   End of Session Equipment Utilized During Treatment: Gait belt Nurse Communication: Mobility status  Activity Tolerance: Patient tolerated treatment well Patient left: in chair;with call bell/phone within reach;with family/visitor present  OT Visit Diagnosis: Unsteadiness on feet (R26.81)                Time: 6237-6283 OT Time Calculation (min): 26 min Charges:  OT General Charges $OT Visit: 1 Visit OT Evaluation $OT Eval Low Complexity: 1 Low OT Treatments $Self Care/Home Management : 8-22 mins  Sharyn Blitz OTR/L, MS Acute Rehabilitation Department Office# [phone] Pager#  [phone]   Ardyth Harps 05/15/2021, 12:47 PM

## 2021-05-15 NOTE — Progress Notes (Signed)
°  Echocardiogram 2D Echocardiogram has been performed.  Roosvelt Maser F 05/15/2021, 1:25 PM

## 2021-05-15 NOTE — Progress Notes (Addendum)
Attempted to call son again, unable to reach. Voicemail left stating pt's room # per pt's permission.

## 2021-05-16 LAB — BASIC METABOLIC PANEL
Anion gap: 5 (ref 5–15)
BUN: 19 mg/dL (ref 8–23)
CO2: 24 mmol/L (ref 22–32)
Calcium: 8.7 mg/dL — ABNORMAL LOW (ref 8.9–10.3)
Chloride: 110 mmol/L (ref 98–111)
Creatinine, Ser: 0.84 mg/dL (ref 0.61–1.24)
GFR, Estimated: >60 mL/min (ref >60)
Glucose, Bld: 91 mg/dL (ref 70–99)
Potassium: 3.8 mmol/L (ref 3.5–5.1)
Sodium: 139 mmol/L (ref 135–145)

## 2021-05-16 LAB — URINE CULTURE: Culture: >=100000 — AB

## 2021-05-16 LAB — CBC
HCT: 37.4 % — ABNORMAL LOW (ref 39.0–52.0)
Hemoglobin: 12.4 g/dL — ABNORMAL LOW (ref 13.0–17.0)
MCH: 30.2 pg (ref 26.0–34.0)
MCHC: 33.2 g/dL (ref 30.0–36.0)
MCV: 91 fL (ref 80.0–100.0)
Platelets: 202 10*3/uL (ref 150–400)
RBC: 4.11 MIL/uL — ABNORMAL LOW (ref 4.22–5.81)
RDW: 13.5 % (ref 11.5–15.5)
WBC: 7.5 10*3/uL (ref 4.0–10.5)
nRBC: 0 % (ref 0.0–0.2)

## 2021-05-16 LAB — MAGNESIUM: Magnesium: 2.1 mg/dL (ref 1.7–2.4)

## 2021-05-16 MED ORDER — ENSURE ENLIVE PO LIQD
237.0000 mL | Freq: Two times a day (BID) | ORAL | Status: DC
  Administered 2021-05-16 – 2021-05-18 (×4): 237 mL via ORAL

## 2021-05-16 MED ORDER — CEFDINIR 300 MG PO CAPS
300.0000 mg | ORAL_CAPSULE | Freq: Two times a day (BID) | ORAL | Status: DC
  Administered 2021-05-17: 300 mg via ORAL
  Filled 2021-05-16 (×2): qty 1

## 2021-05-16 NOTE — Progress Notes (Signed)
PROGRESS NOTE    Dominic ReaderRichard Smith  VZD:[id number] DOB: [birth year] DOA: 05/13/2021 PCP: Pcp, No    No chief complaint on file.   Brief Narrative:  Patient is 81 year old gentleman with no significant past medical history presented with altered mental status.  Per admitting physician patient unaware of events that led him here noted to apparently being found down in the [employer] parking lot where he had stated he was trying to do something with his car and fell down on a gravel hill noted to have abrasions on his hand and left forehead.  Patient seen in the ED noted to be in acute kidney injury, ammonia and UDS were negative.  Urinalysis concerning for UTI.  Patient placed empirically on IV Rocephin and admitted for further evaluation and management.   Assessment & Plan:   Principal Problem:   Acute metabolic encephalopathy Active Problems:   AKI (acute kidney injury) (HCC)   Acute lower UTI   Dehydration   Heart murmur   Mild aortic valve stenosis: Mild to moderate aortic valve stenosis per 2D echo 05/15/2021  #1 acute metabolic encephalopathy -Likely multifactorial secondary to dehydration, UTI. -Clinical improvement. -Chest x-ray with no acute abnormalities. -Urine cultures with > 100,000 colonies of E. coli and pansensitive.   -Transition from IV Rocephin to oral antibiotics.   -IV fluids, supportive care.   2.  E. coli UTI -Urinalysis consistent with UTI. -Urine cultures with >100,000 colonies of E. coli.   -Pansensitive.   -Transition from IV Rocephin to oral Omnicef to complete course of antibiotic treatment.  3.  Dehydration -IV fluids for another 24 hours.  4.  Acute kidney injury -Likely secondary to prerenal azotemia in the setting of UTI. -Renal ultrasound negative for hydronephrosis with probable layered dependent debris within the urinary bladder. -Renal function improved with hydration. -IV fluids, supportive care. -Avoid nephrotoxins.  5.  Heart  murmur/mild to moderate aortic valve stenosis -Patient noted to have a heart murmur on examination and 2D echo ordered with normal EF, grade 1 diastolic dysfunction,NWMA, mild to moderate aortic valve stenosis. -Continue low-dose beta-blocker.   -Outpatient follow-up with PCP/cardiology.      DVT prophylaxis: Lovenox Code Status: Full Family Communication: Updated patient and son at bedside. Disposition:   Status is: Inpatient  Remains inpatient appropriate because: Severity of illness/unsafe disposition.       Consultants:  None  Procedures:  CT head CT C-spine 05/14/2021 Chest x-ray 05/14/2021 Renal ultrasound 05/14/2021 2D echo 05/15/2021    Antimicrobials:  IV Rocephin 05/14/2021 >>>>> 05/16/2021 Omnicef 05/17/2021>>>>>   Subjective: Sitting in chair.  No chest pain.  No shortness of breath.  No abdominal pain.  Overall feeling better.  Alert and oriented to self place.  Unsure of the year or month.  Knows who the president is.  Son at bedside who feels patient has improved clinically since admission.    Objective: Vitals:   05/15/21 0607 05/15/21 1624 05/15/21 1952 05/16/21 0412  BP: (!) 136/93 (!) 142/78 (!) 149/79 (!) 148/65  Pulse: 74 73 77 67  Resp: 16 20 18 17   Temp: (!) 97.5 F (36.4 C) 98.7 F (37.1 C) 98.4 F (36.9 C) 98.2 F (36.8 C)  TempSrc:   Oral Oral  SpO2: 100% 100% 98% 98%  Weight:      Height:        Intake/Output Summary (Last 24 hours) at 05/16/2021 1229 Last data filed at 05/16/2021 0300 Gross per 24 hour  Intake 710.78 ml  Output --  Net 710.78 ml    Filed Weights   05/13/21 2235  Weight: 72.6 kg    Examination:  General exam: NAD. Respiratory system: CTA B.  No wheezes, no crackles, no rhonchi.  Normal Rattray effort.  Speaking in full sentences.   Cardiovascular system: RRR 3/6 systolic ejection murmur.  No JVD, no murmurs rubs or gallops.  No lower extremity edema. Gastrointestinal system: Abdomen is soft, nontender,  nondistended, positive bowel sounds.  No rebound.  No guarding.  Central nervous system: Alert and oriented. No focal neurological deficits. Extremities: Symmetric 5 x 5 power. Skin: No rashes, lesions or ulcers Psychiatry: Judgement and insight appear fair. Mood & affect appropriate.     Data Reviewed: I have personally reviewed following labs and imaging studies  CBC: Recent Labs  Lab 05/14/21 0133 05/15/21 0517 05/16/21 0511  WBC 9.2 6.3 7.5  NEUTROABS 6.6  --   --   HGB 13.0 12.1* 12.4*  HCT 39.3 36.5* 37.4*  MCV 89.3 91.5 91.0  PLT 230 188 202     Basic Metabolic Panel: Recent Labs  Lab 05/14/21 0133 05/15/21 0517 05/16/21 0511  NA 139 143 139  K 3.5 3.5 3.8  CL 109 112* 110  CO2 21* 24 24  GLUCOSE 103* 90 91  BUN 51* 34* 19  CREATININE 1.47* 1.01 0.84  CALCIUM 9.2 8.8* 8.7*  MG  --  2.3 2.1     GFR: Estimated Creatinine Clearance: 70.8 mL/min (by C-G formula based on SCr of 0.84 mg/dL).  Liver Function Tests: Recent Labs  Lab 05/14/21 0133 05/15/21 0517  AST 59* 32  ALT 27 21  ALKPHOS 61 57  BILITOT 1.3* 1.3*  PROT 7.1 5.8*  ALBUMIN 4.1 3.3*     CBG: No results for input(s): GLUCAP in the last 168 hours.   Recent Results (from the past 240 hour(s))  Resp Panel by RT-PCR (Flu A&B, Covid) Nasopharyngeal Swab     Status: None   Collection Time: 05/14/21  1:33 AM   Specimen: Nasopharyngeal Swab; Nasopharyngeal(NP) swabs in vial transport medium  Result Value Ref Range Status   SARS Coronavirus 2 by RT PCR NEGATIVE NEGATIVE Final    Comment: (NOTE) SARS-CoV-2 target nucleic acids are NOT DETECTED.  The SARS-CoV-2 RNA is generally detectable in upper respiratory specimens during the acute phase of infection. The lowest concentration of SARS-CoV-2 viral copies this assay can detect is 138 copies/mL. A negative result does not preclude SARS-Cov-2 infection and should not be used as the sole basis for treatment or other patient management  decisions. A negative result may occur with  improper specimen collection/handling, submission of specimen other than nasopharyngeal swab, presence of viral mutation(s) within the areas targeted by this assay, and inadequate number of viral copies(<138 copies/mL). A negative result must be combined with clinical observations, patient history, and epidemiological information. The expected result is Negative.  Fact Sheet for Patients:  BloggerCourse.com  Fact Sheet for Healthcare Providers:  SeriousBroker.it  This test is no t yet approved or cleared by the [place] FDA and  has been authorized for detection and/or diagnosis of SARS-CoV-2 by FDA under an Emergency Use Authorization (EUA). This EUA will remain  in effect (meaning this test can be used) for the duration of the COVID-19 declaration under Section 564(b)(1) of the Act, 21 U.S.C.section 360bbb-3(b)(1), unless the authorization is terminated  or revoked sooner.       Influenza A by PCR NEGATIVE NEGATIVE Final   Influenza B by PCR  NEGATIVE NEGATIVE Final    Comment: (NOTE) The Xpert Xpress SARS-CoV-2/FLU/RSV plus assay is intended as an aid in the diagnosis of influenza from Nasopharyngeal swab specimens and should not be used as a sole basis for treatment. Nasal washings and aspirates are unacceptable for Xpert Xpress SARS-CoV-2/FLU/RSV testing.  Fact Sheet for Patients: BloggerCourse.com  Fact Sheet for Healthcare Providers: SeriousBroker.it  This test is not yet approved or cleared by the [place] FDA and has been authorized for detection and/or diagnosis of SARS-CoV-2 by FDA under an Emergency Use Authorization (EUA). This EUA will remain in effect (meaning this test can be used) for the duration of the COVID-19 declaration under Section 564(b)(1) of the Act, 21 U.S.C. section 360bbb-3(b)(1), unless the  authorization is terminated or revoked.  Performed at [facility], [address] [place], [place] [address]   Urine Culture     Status: Abnormal   Collection Time: 05/14/21  4:32 AM   Specimen: Urine, Clean Catch  Result Value Ref Range Status   Specimen Description   Final    URINE, CLEAN CATCH Performed at [facility], [address], [place], [place] [address]    Special Requests   Final    NONE Performed at [facility], [address] [place], [place] [address]    Culture >=100,000 COLONIES/mL ESCHERICHIA COLI (A)  Final   Report Status 05/16/2021 FINAL  Final   Organism ID, Bacteria ESCHERICHIA COLI (A)  Final      Susceptibility   Escherichia coli - MIC*    AMPICILLIN <=2 SENSITIVE Sensitive     CEFAZOLIN <=4 SENSITIVE Sensitive     CEFEPIME <=0.12 SENSITIVE Sensitive     CEFTRIAXONE <=0.25 SENSITIVE Sensitive     CIPROFLOXACIN <=0.25 SENSITIVE Sensitive     GENTAMICIN <=1 SENSITIVE Sensitive     IMIPENEM <=0.25 SENSITIVE Sensitive     NITROFURANTOIN <=16 SENSITIVE Sensitive     TRIMETH/SULFA <=20 SENSITIVE Sensitive     AMPICILLIN/SULBACTAM <=2 SENSITIVE Sensitive     * >=100,000 COLONIES/mL ESCHERICHIA COLI          Radiology Studies: ECHOCARDIOGRAM COMPLETE  Result Date: 05/15/2021    ECHOCARDIOGRAM REPORT   Patient Name:   Dominic Smith Date of Exam: 05/15/2021 Medical Rec #:  [id number]       Height:       72.0 in Accession #:    [id number]      Weight:       160.0 lb Date of Birth:  [birth year]       BSA:          1.938 m Patient Age:    80 years        BP:           136/93 mmHg Patient Gender: M               HR:           83 bpm. Exam Location:  Inpatient Procedure: 2D Echo, Cardiac Doppler and Color Doppler Indications:    Murmur R01.1  History:        Patient has no prior history of Echocardiogram examinations.  Sonographer:    Roosvelt Maser RDCS Referring Phys: [id number] V Amra Shukla IMPRESSIONS   1. Left ventricular ejection fraction, by estimation, is 60 to 65%. The left ventricle has normal function. The left ventricle has no regional wall motion abnormalities. There is mild left ventricular hypertrophy. Left ventricular diastolic parameters are consistent  with Grade I diastolic dysfunction (impaired relaxation).  2. Right ventricular systolic function is normal. The right ventricular size is normal. There is normal pulmonary artery systolic pressure.  3. Left atrial size was moderately dilated.  4. The mitral valve is degenerative. Trivial mitral valve regurgitation. No evidence of mitral stenosis.  5. The aortic valve is calcified. There is moderate calcification of the aortic valve. There is moderate thickening of the aortic valve. Aortic valve regurgitation is not visualized. Mild to moderate aortic valve stenosis.  6. The inferior vena cava is normal in size with greater than 50% respiratory variability, suggesting right atrial pressure of 3 mmHg. FINDINGS  Left Ventricle: Left ventricular ejection fraction, by estimation, is 60 to 65%. The left ventricle has normal function. The left ventricle has no regional wall motion abnormalities. The left ventricular internal cavity size was normal in size. There is  mild left ventricular hypertrophy. Left ventricular diastolic parameters are consistent with Grade I diastolic dysfunction (impaired relaxation). Right Ventricle: The right ventricular size is normal. No increase in right ventricular wall thickness. Right ventricular systolic function is normal. There is normal pulmonary artery systolic pressure. The tricuspid regurgitant velocity is 2.41 m/s, and  with an assumed right atrial pressure of 3 mmHg, the estimated right ventricular systolic pressure is 26.2 mmHg. Left Atrium: Left atrial size was moderately dilated. Right Atrium: Right atrial size was normal in size. Pericardium: There is no evidence of pericardial effusion. Mitral Valve: The mitral  valve is degenerative in appearance. There is mild thickening of the mitral valve leaflet(s). There is mild calcification of the mitral valve leaflet(s). Mild mitral annular calcification. Trivial mitral valve regurgitation. No evidence of mitral valve stenosis. Tricuspid Valve: The tricuspid valve is normal in structure. Tricuspid valve regurgitation is trivial. No evidence of tricuspid stenosis. Aortic Valve: The aortic valve is calcified. There is moderate calcification of the aortic valve. There is moderate thickening of the aortic valve. Aortic valve regurgitation is not visualized. Mild to moderate aortic stenosis is present. Aortic valve mean gradient measures 11.0 mmHg. Aortic valve peak gradient measures 19.4 mmHg. Aortic valve area, by VTI measures 1.26 cm. Pulmonic Valve: The pulmonic valve was normal in structure. Pulmonic valve regurgitation is mild. No evidence of pulmonic stenosis. Aorta: The aortic root is normal in size and structure. Venous: The inferior vena cava is normal in size with greater than 50% respiratory variability, suggesting right atrial pressure of 3 mmHg. IAS/Shunts: No atrial level shunt detected by color flow Doppler.  LEFT VENTRICLE PLAX 2D LVIDd:         3.60 cm   Diastology LVIDs:         2.40 cm   LV e' medial:    8.59 cm/s LV PW:         1.50 cm   LV E/e' medial:  11.4 LV IVS:        1.20 cm   LV e' lateral:   10.40 cm/s LVOT diam:     2.00 cm   LV E/e' lateral: 9.4 LV SV:         62 LV SV Index:   32 LVOT Area:     3.14 cm  RIGHT VENTRICLE          IVC RV Basal diam:  3.70 cm  IVC diam: 2.20 cm LEFT ATRIUM             Index        RIGHT ATRIUM  Index LA diam:        4.60 cm 2.37 cm/m   RA Area:     18.00 cm LA Vol (A2C):   73.9 ml 38.14 ml/m  RA Volume:   51.50 ml  26.58 ml/m LA Vol (A4C):   58.2 ml 30.03 ml/m LA Biplane Vol: 66.3 ml 34.21 ml/m  AORTIC VALVE AV Area (Vmax):    1.32 cm AV Area (Vmean):   1.35 cm AV Area (VTI):     1.26 cm AV Vmax:            220.00 cm/s AV Vmean:          154.000 cm/s AV VTI:            0.494 m AV Peak Grad:      19.4 mmHg AV Mean Grad:      11.0 mmHg LVOT Vmax:         92.70 cm/s LVOT Vmean:        66.300 cm/s LVOT VTI:          0.198 m LVOT/AV VTI ratio: 0.40  AORTA Ao Root diam: 3.50 cm MITRAL VALVE                TRICUSPID VALVE MV Area (PHT): 2.67 cm     TR Peak grad:   23.2 mmHg MV Decel Time: 284 msec     TR Vmax:        241.00 cm/s MV E velocity: 98.10 cm/s MV A velocity: 118.00 cm/s  SHUNTS MV E/A ratio:  0.83         Systemic VTI:  0.20 m                             Systemic Diam: 2.00 cm Charlton Haws MD Electronically signed by Charlton Haws MD Signature Date/Time: 05/15/2021/1:49:30 PM    Final         Scheduled Meds:  carvedilol  3.125 mg Oral BID WC   enoxaparin (LOVENOX) injection  40 mg Subcutaneous Q24H   feeding supplement  237 mL Oral BID BM   multivitamin with minerals  1 tablet Oral Daily   senna-docusate  1 tablet Oral BID   Continuous Infusions:  sodium chloride 75 mL/hr at 05/15/21 1800   cefTRIAXone (ROCEPHIN)  IV 2 g (05/16/21 1036)     LOS: 1 day    Time spent: 40 minutes    Ramiro Harvest, MD [facility]   To contact the attending provider between 7A-7P or the covering provider during after hours 7P-7A, please log into the web site [email or web address] and access using universal [place] password for that web site. If you do not have the password, please call the hospital operator.  05/16/2021, 12:29 PM
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
## 2021-05-16 NOTE — Progress Notes (Signed)
OT Cancellation Note  Patient Details Name: Dominic Smith MRN: [id number] DOB: [birth year]   Cancelled Treatment:    Reason Eval/Treat Not Completed: OT screened, no needs identified, will sign off Patient was evaluated by OT on 05/15/21. Please see that evaluation for recommendations for d/c. OT signing off at this time.thank you. Sharyn Blitz OTR/L, MS Acute Rehabilitation Department Office# [phone] Pager# [phone]   05/16/2021, 9:38 AM

## 2021-05-16 NOTE — Plan of Care (Signed)
No acute events this shift. °Problem: Education: °Goal: Knowledge of General Education information will improve °Description: Including pain rating scale, medication(s)/side effects and non-pharmacologic comfort measures °Outcome: Progressing °  °Problem: Health Behavior/Discharge Planning: °Goal: Ability to manage health-related needs will improve °Outcome: Progressing °  °Problem: Clinical Measurements: °Goal: Ability to maintain clinical measurements within normal limits will improve °Outcome: Progressing °Goal: Will remain free from infection °Outcome: Progressing °Goal: Diagnostic test results will improve °Outcome: Progressing °Goal: Respiratory complications will improve °Outcome: Progressing °Goal: Cardiovascular complication will be avoided °Outcome: Progressing °  °Problem: Activity: °Goal: Risk for activity intolerance will decrease °Outcome: Progressing °  °Problem: Nutrition: °Goal: Adequate nutrition will be maintained °Outcome: Progressing °  °Problem: Coping: °Goal: Level of anxiety will decrease °Outcome: Progressing °  °Problem: Elimination: °Goal: Will not experience complications related to bowel motility °Outcome: Progressing °Goal: Will not experience complications related to urinary retention °Outcome: Progressing °  °Problem: Pain Managment: °Goal: General experience of comfort will improve °Outcome: Progressing °  °Problem: Safety: °Goal: Ability to remain free from injury will improve °Outcome: Progressing °  °Problem: Skin Integrity: °Goal: Risk for impaired skin integrity will decrease °Outcome: Progressing °  °
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
## 2021-05-17 ENCOUNTER — Inpatient Hospital Stay (HOSPITAL_COMMUNITY): Payer: Medicare Other

## 2021-05-17 DIAGNOSIS — R030 Elevated blood-pressure reading, without diagnosis of hypertension: Secondary | ICD-10-CM

## 2021-05-17 LAB — BASIC METABOLIC PANEL
Anion gap: 7 (ref 5–15)
BUN: 18 mg/dL (ref 8–23)
CO2: 23 mmol/L (ref 22–32)
Calcium: 8.8 mg/dL — ABNORMAL LOW (ref 8.9–10.3)
Chloride: 108 mmol/L (ref 98–111)
Creatinine, Ser: 1.03 mg/dL (ref 0.61–1.24)
GFR, Estimated: >60 mL/min (ref >60)
Glucose, Bld: 103 mg/dL — ABNORMAL HIGH (ref 70–99)
Potassium: 3.7 mmol/L (ref 3.5–5.1)
Sodium: 138 mmol/L (ref 135–145)

## 2021-05-17 MED ORDER — CEFADROXIL 500 MG PO CAPS
1000.0000 mg | ORAL_CAPSULE | Freq: Two times a day (BID) | ORAL | Status: DC
  Administered 2021-05-17 – 2021-05-18 (×2): 1000 mg via ORAL
  Filled 2021-05-17 (×2): qty 2

## 2021-05-17 MED ORDER — IBUPROFEN 200 MG PO TABS
400.0000 mg | ORAL_TABLET | Freq: Three times a day (TID) | ORAL | Status: DC
  Administered 2021-05-17 – 2021-05-18 (×4): 400 mg via ORAL
  Filled 2021-05-17 (×4): qty 2

## 2021-05-17 MED ORDER — PANTOPRAZOLE SODIUM 40 MG PO TBEC
40.0000 mg | DELAYED_RELEASE_TABLET | Freq: Every day | ORAL | Status: DC
  Administered 2021-05-17 – 2021-05-18 (×2): 40 mg via ORAL
  Filled 2021-05-17 (×2): qty 1

## 2021-05-17 NOTE — TOC Initial Note (Addendum)
Transition of Care [facility]) - Initial/Assessment Note    Patient Details  Name: Dominic Smith MRN: [id number] Date of Birth: [birth year]  [facility] CM/SW Contact:    Darleene Cleaver, LCSW Phone Number: 05/17/2021, 4:35 PM  Clinical Narrative:                  Patient is an 81 year old male who lives in a [place].  Patient states he has been living in the [place] for several months.  Patient's ex-wife was in the room as well while CSW was speaking to him.  Per patient he plans to return back to his [place], and uses a car that he gets around in.  Patient states he has social security coming in and lives off of it and does jobs now and then.  Patient's ex-wife was asking if patient can go to a facility, CSW explained to her that patient only has Medicare part A and also does not have any skillable needs which would justify him going to SNF.  CSW also informed her that patient walked 240 feet with therapy, and he does not have enough needs to go to a skilled facility.  CSW asked patient why he does not have Medicare Part B or D.  Patient stated he did not want to pay for it.  CSW explained the benefits of having the Medicare part A, B, and D or having a Medicare Advantage plan.  Per patient he has never taken any medications, and usually does not need any.  CSW recommended that he look into getting Medicare Part B or D, CSW provided contact information for Medicare for him to call and see if he can get enrolled.    Patient's ex-wife asked if there is any assistance available from Medicaid, and CSW informed her that patient would have to apply for Medicaid and it can take up to 45 days for patient to be approved. CSW provided his ex-wife with the contact number for [facility] Department of social services so patient can reach out to them to see what assistance he may qualify for if he is interested.  Patient states he plans to return back to his [place] where he has been living.  CSW explained that CSW  can not assist with housing, and to speak with [facility] Department of Social Services.  CSW suggested patient stay in a hotel or if he can talk his family in let him stay with them.  CSW also stated that a list of homeless shelters can be given to patient before he discharges.  Expected Discharge Plan: Homeless Shelter Barriers to Discharge: Inadequate or no insurance, Continued Medical Work up, Family Issues   Patient Goals and CMS Choice Patient states their goals for this hospitalization and ongoing recovery are:: Patient plans to return to his [place] [facility] where he has been living. CMS Medicare.gov Compare Post Acute Care list provided to:: Patient Choice offered to / list presented to : Patient  Expected Discharge Plan and Services Expected Discharge Plan: Homeless Shelter In-house Referral: Clinical Social Work     Living arrangements for the past 2 months: No permanent address                                      Prior Living Arrangements/Services Living arrangements for the past 2 months: No permanent address Lives with:: Self Patient language and need for interpreter  reviewed:: Yes Do you feel safe going back to the place where you live?: Yes      Need for Family Participation in Patient Care: No (Comment) Care giver support system in place?: No (comment)   Criminal Activity/Legal Involvement Pertinent to Current Situation/Hospitalization: No - Comment as needed  Activities of Daily Living Home Assistive Devices/Equipment: None ADL Screening (condition at time of admission) Patient's cognitive ability adequate to safely complete daily activities?: No Is the patient deaf or have difficulty hearing?: No Does the patient have difficulty seeing, even when wearing glasses/contacts?: No Does the patient have difficulty concentrating, remembering, or making decisions?: Yes Patient able to express need for assistance with ADLs?: Yes Does the patient have difficulty  dressing or bathing?: No Independently performs ADLs?: No Communication: Independent Dressing (OT): Independent Grooming: Independent Feeding: Independent Bathing: Independent Toileting: Needs assistance Is this a change from baseline?: Pre-admission baseline In/Out Bed: Needs assistance Is this a change from baseline?: Pre-admission baseline Walks in Home: Needs assistance Is this a change from baseline?: Pre-admission baseline Does the patient have difficulty walking or climbing stairs?: No Weakness of Legs: None Weakness of Arms/Hands: None  Permission Sought/Granted                  Emotional Assessment Appearance:: Appears younger than stated age Attitude/Demeanor/Rapport: Engaged Affect (typically observed): Accepting, Appropriate, Calm, Hopeful, Stable Orientation: : Oriented to Self, Oriented to Place, Oriented to  Time, Oriented to Situation Alcohol / Substance Use: Not Applicable Psych Involvement: No (comment)  Admission diagnosis:  Transient alteration of awareness [R40.4] Acute cystitis with hematuria [N30.01] Acute encephalopathy [G93.40] AKI (acute kidney injury) ([place]) [N17.9] Patient Active Problem List   Diagnosis Date Noted   AKI (acute kidney injury) ([place]) 05/15/2021   Acute lower UTI 05/15/2021   Dehydration 05/15/2021   Heart murmur 05/15/2021   Mild aortic valve stenosis: Mild to moderate aortic valve stenosis per 2D echo 05/15/2021 [id number]   Acute metabolic encephalopathy [id number]   PCP:  Pcp, No Pharmacy:   [facility] [address] [place] [address] Phone: [phone] Fax: [phone]     Social Determinants of Health (SDOH) Interventions    Readmission Risk Interventions No flowsheet data found.

## 2021-05-17 NOTE — Progress Notes (Addendum)
PROGRESS NOTE    Dominic Smith  MIW:[id number] DOB: Oct 31, 1940 DOA: 05/13/2021 PCP: Pcp, No    No chief complaint on file.   Brief Narrative:  Patient is 81 year old gentleman with no significant past medical history presented with altered mental status.  Per admitting physician patient unaware of events that led him here noted to apparently being found down in the [employer] parking lot where he had stated he was trying to do something with his car and fell down on a gravel hill noted to have abrasions on his hand and left forehead.  Patient seen in the ED noted to be in acute kidney injury, ammonia and UDS were negative.  Urinalysis concerning for UTI.  Patient placed empirically on IV Rocephin and admitted for further evaluation and management.   Assessment & Plan:   Principal Problem:   Acute metabolic encephalopathy Active Problems:   AKI (acute kidney injury) (HCC)   Acute lower UTI   Dehydration   Heart murmur   Mild aortic valve stenosis: Mild to moderate aortic valve stenosis per 2D echo 05/15/2021  #1 acute metabolic encephalopathy -Likely multifactorial secondary to dehydration, UTI. -Improving clinically -Chest x-ray with no acute abnormalities. -Urine cultures with > 100,000 colonies of E. coli and pansensitive.   -Was on IV Rocephin, transition to [facility], and will be transition to cefadroxil today to complete course of antibiotic treatment.   -Saline lock IV fluids.   -Supportive care.    2.  E. coli UTI -Urinalysis consistent with UTI. -Urine cultures with >100,000 colonies of E. coli.   -Pansensitive.   -Was on IV Rocephin and transition to oral Omnicef and antibiotic to be narrowed to cefadroxil to complete course of antibiotic treatment.   3.  Dehydration -Saline lock IV fluids.  4.  Acute kidney injury -Likely secondary to prerenal azotemia in the setting of UTI. -Renal ultrasound negative for hydronephrosis with probable layered dependent debris  within the urinary bladder. -Renal function improved with hydration. -Saline lock IV fluids.  5.  Heart murmur/mild to moderate aortic valve stenosis -Patient noted to have a heart murmur on examination and 2D echo ordered with normal EF, grade 1 diastolic dysfunction,NWMA, mild to moderate aortic valve stenosis. -Continue low-dose beta-blocker.   -Outpatient follow-up with PCP/cardiology.  6.  Left chest wall pain/left shoulder pain -Patient with complaints of left chest wall pain which is tender to palpation, left shoulder pain stating difficulty raising his arm above his head. -Likely secondary to fall as patient noted to a fall in the parking lot at [employer]. -Place on scheduled ibuprofen. -Plain films of the left shoulder.  7.  Elevated blood pressure reading -Patient with elevated blood pressure reading this morning. -Patient also on assessment noted to have some left chest wall pain and left shoulder pain. -Patient with no prior history of hypertension or not on any antihypertensive medications. -Saline lock IV fluids. -Continue Coreg. -Repeat blood pressure manually.    DVT prophylaxis: Lovenox Code Status: Full Family Communication: Updated patient and son at bedside. Disposition:   Status is: Inpatient  Remains inpatient appropriate because: Severity of illness/unsafe disposition.       Consultants:  None  Procedures:  CT head CT C-spine 05/14/2021 Chest x-ray 05/14/2021 Renal ultrasound 05/14/2021 2D echo 05/15/2021 Plain films of left shoulder pending 05/17/2021    Antimicrobials:  IV Rocephin 05/14/2021 >>>>> 05/16/2021 Omnicef 05/17/2021>>>>> 05/17/2021 Cefadroxil 05/17/2021>>>>   Subjective: Patient sitting up in chair.  Complaining of inability to raise his left shoulder  and left chest wall pain.  No shortness of breath.  No abdominal pain.  Tolerating current diet.  Improving clinically.   Objective: Vitals:   05/16/21 1530 05/16/21 2104 05/17/21  0540 05/17/21 0745  BP: (!) 141/86 (!) 150/78 (!) 189/101 (!) 172/90  Pulse: 79 77 98 76  Resp: 19 20 20    Temp: 98.8 F (37.1 C) 97.8 F (36.6 C) 98.3 F (36.8 C)   TempSrc: Oral Oral Oral   SpO2: 99% 97% 97%   Weight:      Height:        Intake/Output Summary (Last 24 hours) at 05/17/2021 1109 Last data filed at 05/17/2021 0900 Gross per 24 hour  Intake 1915.03 ml  Output 1 ml  Net 1914.03 ml    Filed Weights   05/13/21 2235  Weight: 72.6 kg    Examination:  General exam: NAD. Respiratory system: Lungs clear to auscultation bilaterally.  No wheezes, no crackles, no rhonchi.  Normal respiratory effort.  Speaking in full sentences.  Cardiovascular system: RRR with 3/6 SEM.  No JVD, no murmurs rubs or gallops.  No lower extremity edema.  Left chest wall tender to palpation.  Gastrointestinal system: Abdomen is soft, nontender, nondistended, positive bowel sounds.  No rebound.  No guarding.   Central nervous system: Alert and oriented. No focal neurological deficits. Extremities: Symmetric 5 x 5 power.  Left shoulder pain with left arm raise. Skin: No rashes, lesions or ulcers Psychiatry: Judgement and insight appear fair. Mood & affect appropriate.     Data Reviewed: I have personally reviewed following labs and imaging studies  CBC: Recent Labs  Lab 05/14/21 0133 05/15/21 0517 05/16/21 0511  WBC 9.2 6.3 7.5  NEUTROABS 6.6  --   --   HGB 13.0 12.1* 12.4*  HCT 39.3 36.5* 37.4*  MCV 89.3 91.5 91.0  PLT 230 188 202     Basic Metabolic Panel: Recent Labs  Lab 05/14/21 0133 05/15/21 0517 05/16/21 0511 05/17/21 0503  NA 139 143 139 138  K 3.5 3.5 3.8 3.7  CL 109 112* 110 108  CO2 21* 24 24 23   GLUCOSE 103* 90 91 103*  BUN 51* 34* 19 18  CREATININE 1.47* 1.01 0.84 1.03  CALCIUM 9.2 8.8* 8.7* 8.8*  MG  --  2.3 2.1  --      GFR: Estimated Creatinine Clearance: 57.8 mL/min (by C-G formula based on SCr of 1.03 mg/dL).  Liver Function Tests: Recent  Labs  Lab 05/14/21 0133 05/15/21 0517  AST 59* 32  ALT 27 21  ALKPHOS 61 57  BILITOT 1.3* 1.3*  PROT 7.1 5.8*  ALBUMIN 4.1 3.3*     CBG: No results for input(s): GLUCAP in the last 168 hours.   Recent Results (from the past 240 hour(s))  Resp Panel by RT-PCR (Flu A&B, Covid) Nasopharyngeal Swab     Status: None   Collection Time: 05/14/21  1:33 AM   Specimen: Nasopharyngeal Swab; Nasopharyngeal(NP) swabs in vial transport medium  Result Value Ref Range Status   SARS Coronavirus 2 by RT PCR NEGATIVE NEGATIVE Final    Comment: (NOTE) SARS-CoV-2 target nucleic acids are NOT DETECTED.  The SARS-CoV-2 RNA is generally detectable in upper respiratory specimens during the acute phase of infection. The lowest concentration of SARS-CoV-2 viral copies this assay can detect is 138 copies/mL. A negative result does not preclude SARS-Cov-2 infection and should not be used as the sole basis for treatment or other patient management decisions. A negative result  may occur with  improper specimen collection/handling, submission of specimen other than nasopharyngeal swab, presence of viral mutation(s) within the areas targeted by this assay, and inadequate number of viral copies(<138 copies/mL). A negative result must be combined with clinical observations, patient history, and epidemiological information. The expected result is Negative.  Fact Sheet for Patients:  BloggerCourse.comhttps://www.fda.gov/media/152166/download  Fact Sheet for Healthcare Providers:  SeriousBroker.ithttps://www.fda.gov/media/152162/download  This test is no t yet approved or cleared by the [place] FDA and  has been authorized for detection and/or diagnosis of SARS-CoV-2 by FDA under an Emergency Use Authorization (EUA). This EUA will remain  in effect (meaning this test can be used) for the duration of the COVID-19 declaration under Section 564(b)(1) of the Act, 21 U.S.C.section 360bbb-3(b)(1), unless the authorization is terminated   or revoked sooner.       Influenza A by PCR NEGATIVE NEGATIVE Final   Influenza B by PCR NEGATIVE NEGATIVE Final    Comment: (NOTE) The Xpert Xpress SARS-CoV-2/FLU/RSV plus assay is intended as an aid in the diagnosis of influenza from Nasopharyngeal swab specimens and should not be used as a sole basis for treatment. Nasal washings and aspirates are unacceptable for Xpert Xpress SARS-CoV-2/FLU/RSV testing.  Fact Sheet for Patients: BloggerCourse.comhttps://www.fda.gov/media/152166/download  Fact Sheet for Healthcare Providers: SeriousBroker.ithttps://www.fda.gov/media/152162/download  This test is not yet approved or cleared by the [place] FDA and has been authorized for detection and/or diagnosis of SARS-CoV-2 by FDA under an Emergency Use Authorization (EUA). This EUA will remain in effect (meaning this test can be used) for the duration of the COVID-19 declaration under Section 564(b)(1) of the Act, 21 U.S.C. section 360bbb-3(b)(1), unless the authorization is terminated or revoked.  Performed at [facility], [address] [place], [place] [address]   Urine Culture     Status: Abnormal   Collection Time: 05/14/21  4:32 AM   Specimen: Urine, Clean Catch  Result Value Ref Range Status   Specimen Description   Final    URINE, CLEAN CATCH Performed at [facility], [address] [place], [place] [address]    Special Requests   Final    NONE Performed at [facility], [address] [place], [place] [address]    Culture >=100,000 COLONIES/mL ESCHERICHIA COLI (A)  Final   Report Status 05/16/2021 FINAL  Final   Organism ID, Bacteria ESCHERICHIA COLI (A)  Final      Susceptibility   Escherichia coli - MIC*    AMPICILLIN <=2 SENSITIVE Sensitive     CEFAZOLIN <=4 SENSITIVE Sensitive     CEFEPIME <=0.12 SENSITIVE Sensitive     CEFTRIAXONE <=0.25 SENSITIVE Sensitive     CIPROFLOXACIN <=0.25 SENSITIVE Sensitive     GENTAMICIN <=1  SENSITIVE Sensitive     IMIPENEM <=0.25 SENSITIVE Sensitive     NITROFURANTOIN <=16 SENSITIVE Sensitive     TRIMETH/SULFA <=20 SENSITIVE Sensitive     AMPICILLIN/SULBACTAM <=2 SENSITIVE Sensitive     * >=100,000 COLONIES/mL ESCHERICHIA COLI          Radiology Studies: ECHOCARDIOGRAM COMPLETE  Result Date: 05/15/2021    ECHOCARDIOGRAM REPORT   Patient Name:   Leonia ReaderRICHARD Bugbee Date of Exam: 05/15/2021 Medical Rec #:  [id number]       Height:       72.0 in Accession #:    [id number]      Weight:       160.0 lb Date of Birth:  1940-06-14       BSA:  1.938 m Patient Age:    80 years        BP:           136/93 mmHg Patient Gender: M               HR:           83 bpm. Exam Location:  Inpatient Procedure: 2D Echo, Cardiac Doppler and Color Doppler Indications:    Murmur R01.1  History:        Patient has no prior history of Echocardiogram examinations.  Sonographer:    Roosvelt Maser RDCS Referring Phys: [id number] Nikkita Adeyemi V Khushbu Pippen IMPRESSIONS  1. Left ventricular ejection fraction, by estimation, is 60 to 65%. The left ventricle has normal function. The left ventricle has no regional wall motion abnormalities. There is mild left ventricular hypertrophy. Left ventricular diastolic parameters are consistent with Grade I diastolic dysfunction (impaired relaxation).  2. Right ventricular systolic function is normal. The right ventricular size is normal. There is normal pulmonary artery systolic pressure.  3. Left atrial size was moderately dilated.  4. The mitral valve is degenerative. Trivial mitral valve regurgitation. No evidence of mitral stenosis.  5. The aortic valve is calcified. There is moderate calcification of the aortic valve. There is moderate thickening of the aortic valve. Aortic valve regurgitation is not visualized. Mild to moderate aortic valve stenosis.  6. The inferior vena cava is normal in size with greater than 50% respiratory variability, suggesting right atrial pressure of 3 mmHg.  FINDINGS  Left Ventricle: Left ventricular ejection fraction, by estimation, is 60 to 65%. The left ventricle has normal function. The left ventricle has no regional wall motion abnormalities. The left ventricular internal cavity size was normal in size. There is  mild left ventricular hypertrophy. Left ventricular diastolic parameters are consistent with Grade I diastolic dysfunction (impaired relaxation). Right Ventricle: The right ventricular size is normal. No increase in right ventricular wall thickness. Right ventricular systolic function is normal. There is normal pulmonary artery systolic pressure. The tricuspid regurgitant velocity is 2.41 m/s, and  with an assumed right atrial pressure of 3 mmHg, the estimated right ventricular systolic pressure is 26.2 mmHg. Left Atrium: Left atrial size was moderately dilated. Right Atrium: Right atrial size was normal in size. Pericardium: There is no evidence of pericardial effusion. Mitral Valve: The mitral valve is degenerative in appearance. There is mild thickening of the mitral valve leaflet(s). There is mild calcification of the mitral valve leaflet(s). Mild mitral annular calcification. Trivial mitral valve regurgitation. No evidence of mitral valve stenosis. Tricuspid Valve: The tricuspid valve is normal in structure. Tricuspid valve regurgitation is trivial. No evidence of tricuspid stenosis. Aortic Valve: The aortic valve is calcified. There is moderate calcification of the aortic valve. There is moderate thickening of the aortic valve. Aortic valve regurgitation is not visualized. Mild to moderate aortic stenosis is present. Aortic valve mean gradient measures 11.0 mmHg. Aortic valve peak gradient measures 19.4 mmHg. Aortic valve area, by VTI measures 1.26 cm. Pulmonic Valve: The pulmonic valve was normal in structure. Pulmonic valve regurgitation is mild. No evidence of pulmonic stenosis. Aorta: The aortic root is normal in size and structure. Venous: The  inferior vena cava is normal in size with greater than 50% respiratory variability, suggesting right atrial pressure of 3 mmHg. IAS/Shunts: No atrial level shunt detected by color flow Doppler.  LEFT VENTRICLE PLAX 2D LVIDd:         3.60 cm   Diastology LVIDs:  2.40 cm   LV e' medial:    8.59 cm/s LV PW:         1.50 cm   LV E/e' medial:  11.4 LV IVS:        1.20 cm   LV e' lateral:   10.40 cm/s LVOT diam:     2.00 cm   LV E/e' lateral: 9.4 LV SV:         62 LV SV Index:   32 LVOT Area:     3.14 cm  RIGHT VENTRICLE          IVC RV Basal diam:  3.70 cm  IVC diam: 2.20 cm LEFT ATRIUM             Index        RIGHT ATRIUM           Index LA diam:        4.60 cm 2.37 cm/m   RA Area:     18.00 cm LA Vol (A2C):   73.9 ml 38.14 ml/m  RA Volume:   51.50 ml  26.58 ml/m LA Vol (A4C):   58.2 ml 30.03 ml/m LA Biplane Vol: 66.3 ml 34.21 ml/m  AORTIC VALVE AV Area (Vmax):    1.32 cm AV Area (Vmean):   1.35 cm AV Area (VTI):     1.26 cm AV Vmax:           220.00 cm/s AV Vmean:          154.000 cm/s AV VTI:            0.494 m AV Peak Grad:      19.4 mmHg AV Mean Grad:      11.0 mmHg LVOT Vmax:         92.70 cm/s LVOT Vmean:        66.300 cm/s LVOT VTI:          0.198 m LVOT/AV VTI ratio: 0.40  AORTA Ao Root diam: 3.50 cm MITRAL VALVE                TRICUSPID VALVE MV Area (PHT): 2.67 cm     TR Peak grad:   23.2 mmHg MV Decel Time: 284 msec     TR Vmax:        241.00 cm/s MV E velocity: 98.10 cm/s MV A velocity: 118.00 cm/s  SHUNTS MV E/A ratio:  0.83         Systemic VTI:  0.20 m                             Systemic Diam: 2.00 cm Charlton Haws MD Electronically signed by Charlton Haws MD Signature Date/Time: 05/15/2021/1:49:30 PM    Final         Scheduled Meds:  carvedilol  3.125 mg Oral BID WC   cefdinir  300 mg Oral Q12H   enoxaparin (LOVENOX) injection  40 mg Subcutaneous Q24H   feeding supplement  237 mL Oral BID BM   multivitamin with minerals  1 tablet Oral Daily   senna-docusate  1 tablet Oral BID    Continuous Infusions:     LOS: 2 days    Time spent: 40 minutes    Ramiro Harvest, MD [facility]   To contact the attending provider between 7A-7P or the covering provider during after hours 7P-7A, please log into the web site [email or web address] and access using universal [place] password for  that web site. If you do not have the password, please call the hospital operator.  05/17/2021, 11:09 AM

## 2021-05-18 ENCOUNTER — Other Ambulatory Visit (HOSPITAL_COMMUNITY): Payer: Self-pay

## 2021-05-18 DIAGNOSIS — N3001 Acute cystitis with hematuria: Secondary | ICD-10-CM

## 2021-05-18 MED ORDER — CEFADROXIL 500 MG PO CAPS
1000.0000 mg | ORAL_CAPSULE | Freq: Two times a day (BID) | ORAL | 0 refills | Status: AC
  Filled 2021-05-18: qty 10, 3d supply, fill #0

## 2021-05-18 MED ORDER — ACETAMINOPHEN 325 MG PO TABS: 650.0000 mg | ORAL_TABLET | Freq: Four times a day (QID) | ORAL | Status: DC | PRN

## 2021-05-18 MED ORDER — IBUPROFEN 400 MG PO TABS: 400.0000 mg | ORAL_TABLET | Freq: Three times a day (TID) | ORAL | 0 refills | Status: AC

## 2021-05-18 MED ORDER — CARVEDILOL 3.125 MG PO TABS
3.1250 mg | ORAL_TABLET | Freq: Two times a day (BID) | ORAL | 1 refills | Status: DC
  Filled 2021-05-18 (×2): qty 60, 30d supply, fill #0

## 2021-05-18 MED ORDER — ENSURE ENLIVE PO LIQD: 237.0000 mL | Freq: Two times a day (BID) | ORAL | 12 refills | Status: DC

## 2021-05-18 NOTE — Progress Notes (Signed)
Pt discharged. PIV removed. AVS printed and educational teaching completed with teach back method. Pt family member at bedside during educational teaching. Rx meds delivered at bedside to patient and patient family member. Homeless shelter list provided in AVS. Pt has all belongings. No further questions at this time.
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
## 2021-05-18 NOTE — Care Management Important Message (Signed)
Important Message  Patient Details IM Letter placed in Patients room. Name: Dominic Smith MRN: [id number] Date of Birth: 19-Sep-1940   Medicare Important Message Given:  Yes     Kerin Salen 05/18/2021, 1:44 PM

## 2021-05-18 NOTE — TOC Progression Note (Addendum)
Transition of Care [facility]) - Progression Note    Patient Details  Name: Dominic Smith MRN: [id number] Date of Birth: 26-Aug-1940  [facility] CM/SW Contact  Darleene Cleaver, [place] Phone Number: 05/18/2021, 11:41 AM  Clinical Narrative:     CSW spoke to patient, to find out what his plan is after discharge.  Per patient he plans to go back to his [place] and get it fixed.  CSW asked patient how is he going to get there, per patient his son will transport him.  CSW asked how long he has been living in his [place] and he said at least a couple of years.  Patient states he prefers to live in a [place], and he stays at the secure parking lot at night in [place].  CSW asked patient if he has ever thought about getting an apartment or house, and patient states he does not want to and prefers to be able to just drive wherever he wants to.  Patient states he plans to continue travelling.  CSW asked patient if he has a PCP, and patient states no he does not because he does not ever go to the doctor.  CSW asked if it is okay to get him an appointment for the [facility] and [facility] for a hospital follow up patient states that is fine.  CSW called [facility], patient has an appointment for April 17 at 8:50am.  CSW confirmed with patient his plan is to return to his [place] and get it fixed.  Patient has a list of homeless shelters added to his AVS.   Expected Discharge Plan: Homeless Shelter Barriers to Discharge: Inadequate or no insurance, Continued Medical Work up, Family Issues  Expected Discharge Plan and Services Expected Discharge Plan: Homeless Shelter In-house Referral: Clinical Social Work     Living arrangements for the past 2 months: No permanent address                                       Social Determinants of Health (SDOH) Interventions    Readmission Risk Interventions No flowsheet data found.

## 2021-05-18 NOTE — Discharge Instructions (Signed)
Facility Address Phone Ext. Comments  [facility] [address]  [place] [phone]  DV Shelter   [facility] [address]  [place] [phone]  DV Shelter  [facility] [phone] [address] [place] [phone]  Families' w/Children   [facility] [address] [phone]  Men/Women/Families   [facility] [address] [place].  [place] [phone] Men and Women  [facility] [place] [phone]    pregnant/parenting girls and women  [facility]  [place] [phone]  ages [address] [place] [phone]  Youth ages 11-17  [facility] [address] [place] [phone]  Men  [facility] [phone] [address]  [place] [phone]  Women  [facility] [address]  [place] [phone]  Single Women and Women w/Children  [employer] [address]  [place] [phone] Men/Women/Families   [facility] [address]  [place] [phone]  DV Shelter  [facility] [address]  [place] [phone] Men and Women  [facility] [address] [place] [phone] Men  [facility] [address]  [place] [phone] Men  [facility] [address]  [place] [phone]  Single Women and Families  Room at the [facility]. [place] [phone] or  [phone]  Pregnant Women  [facility] [address]  [place]  [phone]  Men/Women and Families

## 2021-08-09 ENCOUNTER — Ambulatory Visit: Payer: Self-pay | Admitting: Family Medicine

## 2022-01-10 ENCOUNTER — Inpatient Hospital Stay (HOSPITAL_COMMUNITY): Payer: Medicare Other

## 2022-01-10 ENCOUNTER — Inpatient Hospital Stay (HOSPITAL_COMMUNITY)
Admission: EM | Admit: 2022-01-10 | Discharge: 2022-01-19 | DRG: 963 | Disposition: A | Discharge status: Rehabilitation Facility | Payer: Medicare Other | Attending: Surgery | Admitting: Surgery

## 2022-01-10 ENCOUNTER — Emergency Department (HOSPITAL_COMMUNITY): Payer: Medicare Other

## 2022-01-10 DIAGNOSIS — D62 Acute posthemorrhagic anemia: Secondary | ICD-10-CM | POA: Diagnosis not present

## 2022-01-10 DIAGNOSIS — R339 Retention of urine, unspecified: Secondary | ICD-10-CM | POA: Diagnosis present

## 2022-01-10 DIAGNOSIS — S15101A Unspecified injury of right vertebral artery, initial encounter: Secondary | ICD-10-CM | POA: Diagnosis present

## 2022-01-10 DIAGNOSIS — D696 Thrombocytopenia, unspecified: Secondary | ICD-10-CM | POA: Diagnosis not present

## 2022-01-10 DIAGNOSIS — Z20822 Contact with and (suspected) exposure to covid-19: Secondary | ICD-10-CM | POA: Diagnosis present

## 2022-01-10 DIAGNOSIS — S0101XA Laceration without foreign body of scalp, initial encounter: Secondary | ICD-10-CM | POA: Diagnosis present

## 2022-01-10 DIAGNOSIS — I471 Supraventricular tachycardia: Secondary | ICD-10-CM | POA: Diagnosis not present

## 2022-01-10 DIAGNOSIS — B965 Pseudomonas (aeruginosa) (mallei) (pseudomallei) as the cause of diseases classified elsewhere: Secondary | ICD-10-CM | POA: Diagnosis not present

## 2022-01-10 DIAGNOSIS — E43 Unspecified severe protein-calorie malnutrition: Secondary | ICD-10-CM | POA: Insufficient documentation

## 2022-01-10 DIAGNOSIS — I350 Nonrheumatic aortic (valve) stenosis: Secondary | ICD-10-CM | POA: Diagnosis present

## 2022-01-10 DIAGNOSIS — S50811A Abrasion of right forearm, initial encounter: Secondary | ICD-10-CM | POA: Diagnosis present

## 2022-01-10 DIAGNOSIS — R7401 Elevation of levels of liver transaminase levels: Secondary | ICD-10-CM | POA: Diagnosis present

## 2022-01-10 DIAGNOSIS — S27892A Contusion of other specified intrathoracic organs, initial encounter: Secondary | ICD-10-CM | POA: Diagnosis present

## 2022-01-10 DIAGNOSIS — S069XAA Unspecified intracranial injury with loss of consciousness status unknown, initial encounter: Secondary | ICD-10-CM

## 2022-01-10 DIAGNOSIS — R5383 Other fatigue: Secondary | ICD-10-CM | POA: Diagnosis present

## 2022-01-10 DIAGNOSIS — S069X9A Unspecified intracranial injury with loss of consciousness of unspecified duration, initial encounter: Secondary | ICD-10-CM | POA: Diagnosis present

## 2022-01-10 DIAGNOSIS — S42301A Unspecified fracture of shaft of humerus, right arm, initial encounter for closed fracture: Secondary | ICD-10-CM | POA: Diagnosis present

## 2022-01-10 DIAGNOSIS — S2231XA Fracture of one rib, right side, initial encounter for closed fracture: Secondary | ICD-10-CM | POA: Diagnosis present

## 2022-01-10 DIAGNOSIS — S270XXA Traumatic pneumothorax, initial encounter: Secondary | ICD-10-CM | POA: Diagnosis present

## 2022-01-10 DIAGNOSIS — S15001A Unspecified injury of right carotid artery, initial encounter: Secondary | ICD-10-CM | POA: Diagnosis present

## 2022-01-10 DIAGNOSIS — I440 Atrioventricular block, first degree: Secondary | ICD-10-CM | POA: Diagnosis not present

## 2022-01-10 DIAGNOSIS — S12400A Unspecified displaced fracture of fifth cervical vertebra, initial encounter for closed fracture: Secondary | ICD-10-CM | POA: Diagnosis present

## 2022-01-10 DIAGNOSIS — S12500A Unspecified displaced fracture of sixth cervical vertebra, initial encounter for closed fracture: Secondary | ICD-10-CM | POA: Diagnosis present

## 2022-01-10 DIAGNOSIS — Y95 Nosocomial condition: Secondary | ICD-10-CM | POA: Diagnosis not present

## 2022-01-10 DIAGNOSIS — S0636AA Traumatic hemorrhage of cerebrum, unspecified, with loss of consciousness status unknown, initial encounter: Secondary | ICD-10-CM | POA: Diagnosis not present

## 2022-01-10 DIAGNOSIS — Z79899 Other long term (current) drug therapy: Secondary | ICD-10-CM

## 2022-01-10 DIAGNOSIS — S129XXA Fracture of neck, unspecified, initial encounter: Secondary | ICD-10-CM

## 2022-01-10 DIAGNOSIS — E8770 Fluid overload, unspecified: Secondary | ICD-10-CM | POA: Diagnosis not present

## 2022-01-10 DIAGNOSIS — J15212 Pneumonia due to Methicillin resistant Staphylococcus aureus: Secondary | ICD-10-CM | POA: Diagnosis not present

## 2022-01-10 DIAGNOSIS — N179 Acute kidney failure, unspecified: Secondary | ICD-10-CM | POA: Diagnosis present

## 2022-01-10 DIAGNOSIS — R58 Hemorrhage, not elsewhere classified: Secondary | ICD-10-CM | POA: Diagnosis present

## 2022-01-10 DIAGNOSIS — S0081XA Abrasion of other part of head, initial encounter: Secondary | ICD-10-CM | POA: Diagnosis present

## 2022-01-10 DIAGNOSIS — Y92410 Unspecified street and highway as the place of occurrence of the external cause: Secondary | ICD-10-CM

## 2022-01-10 DIAGNOSIS — S2239XA Fracture of one rib, unspecified side, initial encounter for closed fracture: Secondary | ICD-10-CM | POA: Diagnosis present

## 2022-01-10 DIAGNOSIS — R011 Cardiac murmur, unspecified: Secondary | ICD-10-CM | POA: Diagnosis present

## 2022-01-10 DIAGNOSIS — Z23 Encounter for immunization: Secondary | ICD-10-CM | POA: Diagnosis not present

## 2022-01-10 DIAGNOSIS — S066XAA Traumatic subarachnoid hemorrhage with loss of consciousness status unknown, initial encounter: Secondary | ICD-10-CM | POA: Diagnosis not present

## 2022-01-10 DIAGNOSIS — R600 Localized edema: Secondary | ICD-10-CM | POA: Diagnosis present

## 2022-01-10 DIAGNOSIS — I10 Essential (primary) hypertension: Secondary | ICD-10-CM | POA: Diagnosis present

## 2022-01-10 DIAGNOSIS — S12600A Unspecified displaced fracture of seventh cervical vertebra, initial encounter for closed fracture: Secondary | ICD-10-CM | POA: Diagnosis present

## 2022-01-10 DIAGNOSIS — S42031A Displaced fracture of lateral end of right clavicle, initial encounter for closed fracture: Secondary | ICD-10-CM | POA: Diagnosis present

## 2022-01-10 DIAGNOSIS — R410 Disorientation, unspecified: Secondary | ICD-10-CM | POA: Diagnosis present

## 2022-01-10 HISTORY — DX: Fracture of neck, unspecified, initial encounter: S12.9XXA

## 2022-01-10 HISTORY — DX: Unspecified intracranial injury with loss of consciousness status unknown, initial encounter: S06.9XAA

## 2022-01-10 LAB — LACTIC ACID, PLASMA: Lactic Acid, Venous: 3.4 mmol/L (ref 0.5–1.9)

## 2022-01-10 LAB — CBC
HCT: 38.9 % — ABNORMAL LOW (ref 39.0–52.0)
Hemoglobin: 12.1 g/dL — ABNORMAL LOW (ref 13.0–17.0)
MCH: 30.2 pg (ref 26.0–34.0)
MCHC: 31.1 g/dL (ref 30.0–36.0)
MCV: 97 fL (ref 80.0–100.0)
Platelets: 212 10*3/uL (ref 150–400)
RBC: 4.01 MIL/uL — ABNORMAL LOW (ref 4.22–5.81)
RDW: 14.5 % (ref 11.5–15.5)
WBC: 7.5 10*3/uL (ref 4.0–10.5)
nRBC: 0 % (ref 0.0–0.2)

## 2022-01-10 LAB — RESP PANEL BY RT-PCR (FLU A&B, COVID) ARPGX2
Influenza A by PCR: NEGATIVE
Influenza B by PCR: NEGATIVE
SARS Coronavirus 2 by RT PCR: NEGATIVE

## 2022-01-10 LAB — I-STAT CHEM 8, ED
BUN: 19 mg/dL (ref 8–23)
Calcium, Ion: 1.1 mmol/L — ABNORMAL LOW (ref 1.15–1.40)
Chloride: 107 mmol/L (ref 98–111)
Creatinine, Ser: 1.7 mg/dL — ABNORMAL HIGH (ref 0.61–1.24)
Glucose, Bld: 102 mg/dL — ABNORMAL HIGH (ref 70–99)
HCT: 36 % — ABNORMAL LOW (ref 39.0–52.0)
Hemoglobin: 12.2 g/dL — ABNORMAL LOW (ref 13.0–17.0)
Potassium: 3.4 mmol/L — ABNORMAL LOW (ref 3.5–5.1)
Sodium: 143 mmol/L (ref 135–145)
TCO2: 25 mmol/L (ref 22–32)

## 2022-01-10 LAB — COMPREHENSIVE METABOLIC PANEL
ALT: 127 U/L — ABNORMAL HIGH (ref 0–44)
AST: 244 U/L — ABNORMAL HIGH (ref 15–41)
Albumin: 3.4 g/dL — ABNORMAL LOW (ref 3.5–5.0)
Alkaline Phosphatase: 75 U/L (ref 38–126)
Anion gap: 13 (ref 5–15)
BUN: 19 mg/dL (ref 8–23)
CO2: 23 mmol/L (ref 22–32)
Calcium: 9 mg/dL (ref 8.9–10.3)
Chloride: 108 mmol/L (ref 98–111)
Creatinine, Ser: 1.8 mg/dL — ABNORMAL HIGH (ref 0.61–1.24)
GFR, Estimated: 37 mL/min — ABNORMAL LOW (ref >60)
Glucose, Bld: 109 mg/dL — ABNORMAL HIGH (ref 70–99)
Potassium: 3.4 mmol/L — ABNORMAL LOW (ref 3.5–5.1)
Sodium: 144 mmol/L (ref 135–145)
Total Bilirubin: 1 mg/dL (ref 0.3–1.2)
Total Protein: 6 g/dL — ABNORMAL LOW (ref 6.5–8.1)

## 2022-01-10 LAB — SAMPLE TO BLOOD BANK

## 2022-01-10 LAB — PROTIME-INR
INR: 1.2 (ref 0.8–1.2)
Prothrombin Time: 15.1 seconds (ref 11.4–15.2)

## 2022-01-10 LAB — ETHANOL: Alcohol, Ethyl (B): <10 mg/dL (ref <10)

## 2022-01-10 MED ORDER — LIDOCAINE HCL (PF) 1 % IJ SOLN
INTRAMUSCULAR | Status: AC
  Filled 2022-01-10: qty 30

## 2022-01-10 MED ORDER — ACETAMINOPHEN 325 MG PO TABS: 650.0000 mg | ORAL_TABLET | Freq: Four times a day (QID) | ORAL | Status: DC

## 2022-01-10 MED ORDER — TETANUS-DIPHTH-ACELL PERTUSSIS 5-2.5-18.5 LF-MCG/0.5 IM SUSY
0.5000 mL | PREFILLED_SYRINGE | Freq: Once | INTRAMUSCULAR | Status: AC
  Administered 2022-01-10: 0.5 mL via INTRAMUSCULAR
  Filled 2022-01-10: qty 0.5

## 2022-01-10 MED ORDER — FENTANYL CITRATE PF 50 MCG/ML IJ SOSY
PREFILLED_SYRINGE | INTRAMUSCULAR | Status: AC
  Administered 2022-01-10: 50 ug via INTRAVENOUS
  Filled 2022-01-10: qty 1

## 2022-01-10 MED ORDER — MORPHINE SULFATE (PF) 2 MG/ML IV SOLN
4.0000 mg | INTRAVENOUS | Status: DC | PRN
  Administered 2022-01-10 – 2022-01-11 (×3): 4 mg via INTRAVENOUS
  Filled 2022-01-10 (×3): qty 2

## 2022-01-10 MED ORDER — ONDANSETRON HCL 4 MG/2ML IJ SOLN
4.0000 mg | Freq: Four times a day (QID) | INTRAMUSCULAR | Status: DC | PRN
  Administered 2022-01-10: 4 mg via INTRAVENOUS
  Filled 2022-01-10: qty 2

## 2022-01-10 MED ORDER — METHOCARBAMOL 500 MG PO TABS: 500.0000 mg | ORAL_TABLET | Freq: Three times a day (TID) | ORAL | Status: DC

## 2022-01-10 MED ORDER — HYDRALAZINE HCL 20 MG/ML IJ SOLN
10.0000 mg | INTRAMUSCULAR | Status: DC | PRN
  Administered 2022-01-17: 10 mg via INTRAVENOUS
  Filled 2022-01-10: qty 1

## 2022-01-10 MED ORDER — HALOPERIDOL LACTATE 5 MG/ML IJ SOLN
2.0000 mg | Freq: Four times a day (QID) | INTRAMUSCULAR | Status: DC | PRN
  Administered 2022-01-10 – 2022-01-17 (×5): 2 mg via INTRAVENOUS
  Filled 2022-01-10 (×6): qty 1

## 2022-01-10 MED ORDER — CEFAZOLIN SODIUM-DEXTROSE 2-4 GM/100ML-% IV SOLN
2.0000 g | Freq: Once | INTRAVENOUS | Status: AC
  Administered 2022-01-10: 2 g via INTRAVENOUS
  Filled 2022-01-10: qty 100

## 2022-01-10 MED ORDER — LACTATED RINGERS IV BOLUS
1000.0000 mL | Freq: Once | INTRAVENOUS | Status: AC
  Administered 2022-01-10: 1000 mL via INTRAVENOUS

## 2022-01-10 MED ORDER — IOHEXOL 350 MG/ML SOLN
100.0000 mL | Freq: Once | INTRAVENOUS | Status: AC | PRN
  Administered 2022-01-10: 75 mL via INTRAVENOUS

## 2022-01-10 MED ORDER — LORAZEPAM 2 MG/ML IJ SOLN
1.0000 mg | Freq: Once | INTRAMUSCULAR | Status: AC
  Administered 2022-01-10: 1 mg via INTRAVENOUS
  Filled 2022-01-10: qty 1

## 2022-01-10 MED ORDER — ENOXAPARIN SODIUM 30 MG/0.3ML IJ SOSY: 30.0000 mg | PREFILLED_SYRINGE | INTRAMUSCULAR | Status: DC

## 2022-01-10 MED ORDER — FENTANYL CITRATE PF 50 MCG/ML IJ SOSY: 50.0000 ug | PREFILLED_SYRINGE | Freq: Once | INTRAMUSCULAR | Status: AC

## 2022-01-10 MED ORDER — IOHEXOL 350 MG/ML SOLN
50.0000 mL | Freq: Once | INTRAVENOUS | Status: AC | PRN
  Administered 2022-01-10: 50 mL via INTRAVENOUS

## 2022-01-10 MED ORDER — DOCUSATE SODIUM 100 MG PO CAPS: 100.0000 mg | ORAL_CAPSULE | Freq: Two times a day (BID) | ORAL | Status: DC

## 2022-01-10 MED ORDER — SODIUM CHLORIDE 0.9 % IV SOLN: INTRAVENOUS | Status: DC

## 2022-01-10 MED ORDER — ASPIRIN 81 MG PO TBEC
81.0000 mg | DELAYED_RELEASE_TABLET | Freq: Every day | ORAL | Status: DC
  Administered 2022-01-12: 81 mg via ORAL
  Filled 2022-01-10: qty 1

## 2022-01-10 MED ORDER — ONDANSETRON 4 MG PO TBDP: 4.0000 mg | ORAL_TABLET | Freq: Four times a day (QID) | ORAL | Status: DC | PRN

## 2022-01-10 MED ORDER — LIDOCAINE-EPINEPHRINE (PF) 2 %-1:200000 IJ SOLN
INTRAMUSCULAR | Status: AC
  Filled 2022-01-10: qty 20

## 2022-01-10 MED ORDER — TRAMADOL HCL 50 MG PO TABS: 50.0000 mg | ORAL_TABLET | Freq: Four times a day (QID) | ORAL | Status: DC | PRN

## 2022-01-10 MED ORDER — MORPHINE SULFATE (PF) 4 MG/ML IV SOLN
4.0000 mg | INTRAVENOUS | Status: DC | PRN
  Administered 2022-01-10: 4 mg via INTRAVENOUS
  Filled 2022-01-10: qty 1

## 2022-01-10 NOTE — Progress Notes (Signed)
Orthopedic Tech Progress Note Patient Details:  Dominic Smith 12-12-1940 [id number]  Patient ID: Dominic Smith, male   DOB: [birth year], 81 y.o.   MRN: [id number] I attended trauma page Dominic Smith 01/10/2022, 6:59 AM

## 2022-01-10 NOTE — ED Notes (Signed)
Pt has a condom cath in place with no urine output since this AM. Bladder scan result is 346ml. Pt continues to be confused. Currently sleepy but is arousable to voice. Will continue to monitor.

## 2022-01-10 NOTE — Progress Notes (Signed)
Pt arrived to 4e. Vitals obtained. Telemetry box applied. Bed alarm on.
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
## 2022-01-10 NOTE — ED Notes (Signed)
Pt became agitated at this time and started removing c-collar. Pt states, "I need to get out of this. I need to go." Pt is hart to redirect at this time due to confusion. Left wrist restraint applied. Ativan IVP given. Trauma MD notified of events. Will continue to monitor.
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
## 2022-01-10 NOTE — ED Notes (Signed)
MD made aware of pt bp. IVF maintenance started. Pt continues to be agitated when awake. Restraints to left wrist continued. Will continue to monitor.
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
## 2022-01-10 NOTE — ED Notes (Signed)
Dr Grandville Silos spoke with Trinda Pascal, NP with neurosurgery.  Awaiting plan for Cervical fractures.  C-collar for now.  Replaced EMS c-collar with [facility] after CT scans were done.
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
## 2022-01-10 NOTE — ED Notes (Signed)
PT transported to CT by primary RN and TRN.
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
## 2022-01-10 NOTE — Consult Note (Signed)
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 tears, shoulder mod TTP, no instability, no blocks to motion  Sens  Ax/R/M/U intact  Mot   Ax/ R/ PIN/ M/ U intact, ?AIN impairment  Rad 2+  Skin:    General: Skin is warm and dry.  Neurological:     Mental Status: He is alert.  Psychiatric:        Mood and Affect: Mood normal.        Behavior: Behavior normal.     Assessment/Plan: Right distal clavicle fx -- Should do well with non-operative management in a sling. Right humerus fx -- Plan non-operative management in a sling. F/u with Dr. Marcelino Scot in 2 weeks.    Lisette [name], PA-C Orthopedic Surgery [phone] 01/10/2022, 8:58 AM

## 2022-01-10 NOTE — Progress Notes (Signed)
Orthopedic Tech Progress Note Patient Details:  Dominic Smith 08/02/1940 [id number]  Ortho Devices Type of Ortho Device: Sling immobilizer Ortho Device/Splint Location: RUE Ortho Device/Splint Interventions: Ordered, Application, Adjustment   Post Interventions Patient Tolerated: Well Instructions Provided: Care of [place] 01/10/2022, 9:17 AM
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
## 2022-01-11 ENCOUNTER — Inpatient Hospital Stay (HOSPITAL_COMMUNITY): Payer: Medicare Other

## 2022-01-11 LAB — BASIC METABOLIC PANEL
Anion gap: 4 — ABNORMAL LOW (ref 5–15)
BUN: 18 mg/dL (ref 8–23)
CO2: 26 mmol/L (ref 22–32)
Calcium: 8.3 mg/dL — ABNORMAL LOW (ref 8.9–10.3)
Chloride: 112 mmol/L — ABNORMAL HIGH (ref 98–111)
Creatinine, Ser: 1.52 mg/dL — ABNORMAL HIGH (ref 0.61–1.24)
GFR, Estimated: 45 mL/min — ABNORMAL LOW (ref >60)
Glucose, Bld: 141 mg/dL — ABNORMAL HIGH (ref 70–99)
Potassium: 4.2 mmol/L (ref 3.5–5.1)
Sodium: 142 mmol/L (ref 135–145)

## 2022-01-11 LAB — HEPATIC FUNCTION PANEL
ALT: 83 U/L — ABNORMAL HIGH (ref 0–44)
AST: 123 U/L — ABNORMAL HIGH (ref 15–41)
Albumin: 2.9 g/dL — ABNORMAL LOW (ref 3.5–5.0)
Alkaline Phosphatase: 59 U/L (ref 38–126)
Bilirubin, Direct: 0.3 mg/dL — ABNORMAL HIGH (ref 0.0–0.2)
Indirect Bilirubin: 1 mg/dL — ABNORMAL HIGH (ref 0.3–0.9)
Total Bilirubin: 1.3 mg/dL — ABNORMAL HIGH (ref 0.3–1.2)
Total Protein: 5.6 g/dL — ABNORMAL LOW (ref 6.5–8.1)

## 2022-01-11 LAB — CBC
HCT: 33.2 % — ABNORMAL LOW (ref 39.0–52.0)
Hemoglobin: 10.8 g/dL — ABNORMAL LOW (ref 13.0–17.0)
MCH: 30.5 pg (ref 26.0–34.0)
MCHC: 32.5 g/dL (ref 30.0–36.0)
MCV: 93.8 fL (ref 80.0–100.0)
Platelets: 157 10*3/uL (ref 150–400)
RBC: 3.54 MIL/uL — ABNORMAL LOW (ref 4.22–5.81)
RDW: 14.6 % (ref 11.5–15.5)
WBC: 10.1 10*3/uL (ref 4.0–10.5)
nRBC: 0 % (ref 0.0–0.2)

## 2022-01-11 LAB — GLUCOSE, CAPILLARY
Glucose-Capillary: 117 mg/dL — ABNORMAL HIGH (ref 70–99)
Glucose-Capillary: 133 mg/dL — ABNORMAL HIGH (ref 70–99)

## 2022-01-11 MED ORDER — OSMOLITE 1.2 CAL PO LIQD: 1000.0000 mL | ORAL | Status: DC

## 2022-01-11 MED ORDER — PROSOURCE TF20 ENFIT COMPATIBL EN LIQD
60.0000 mL | Freq: Every day | ENTERAL | Status: DC
  Administered 2022-01-11 – 2022-01-19 (×9): 60 mL
  Filled 2022-01-11 (×9): qty 60

## 2022-01-11 MED ORDER — TAMSULOSIN HCL 0.4 MG PO CAPS
0.4000 mg | ORAL_CAPSULE | Freq: Every day | ORAL | Status: DC
  Administered 2022-01-12 – 2022-01-16 (×5): 0.4 mg via ORAL
  Filled 2022-01-11 (×6): qty 1

## 2022-01-11 MED ORDER — FENTANYL CITRATE PF 50 MCG/ML IJ SOSY: 12.5000 ug | PREFILLED_SYRINGE | INTRAMUSCULAR | Status: DC | PRN

## 2022-01-11 MED ORDER — BACITRACIN ZINC 500 UNIT/GM EX OINT
TOPICAL_OINTMENT | Freq: Every day | CUTANEOUS | Status: DC
  Filled 2022-01-11 (×2): qty 28.4

## 2022-01-11 MED ORDER — METHOCARBAMOL 1000 MG/10ML IJ SOLN
500.0000 mg | Freq: Four times a day (QID) | INTRAVENOUS | Status: DC | PRN
  Administered 2022-01-11: 500 mg via INTRAVENOUS
  Filled 2022-01-11: qty 500
  Filled 2022-01-11: qty 5

## 2022-01-11 MED ORDER — ACETAMINOPHEN 160 MG/5ML PO SOLN
650.0000 mg | Freq: Four times a day (QID) | ORAL | Status: DC
  Administered 2022-01-11 – 2022-01-14 (×12): 650 mg via ORAL
  Filled 2022-01-11 (×12): qty 20.3

## 2022-01-11 MED ORDER — FENTANYL CITRATE PF 50 MCG/ML IJ SOSY
12.5000 ug | PREFILLED_SYRINGE | INTRAMUSCULAR | Status: DC | PRN
  Filled 2022-01-11: qty 1

## 2022-01-11 MED ORDER — DOCUSATE SODIUM 50 MG/5ML PO LIQD
100.0000 mg | Freq: Two times a day (BID) | ORAL | Status: DC
  Administered 2022-01-11 – 2022-01-19 (×16): 100 mg
  Filled 2022-01-11 (×17): qty 10

## 2022-01-11 MED ORDER — ACETAMINOPHEN 10 MG/ML IV SOLN
1000.0000 mg | Freq: Four times a day (QID) | INTRAVENOUS | Status: DC
  Administered 2022-01-11: 1000 mg via INTRAVENOUS
  Filled 2022-01-11 (×2): qty 100

## 2022-01-11 MED ORDER — OSMOLITE 1.5 CAL PO LIQD
1000.0000 mL | ORAL | Status: DC
  Administered 2022-01-11 – 2022-01-18 (×7): 1000 mL
  Filled 2022-01-11 (×4): qty 1000

## 2022-01-11 MED ORDER — LEVETIRACETAM IN NACL 500 MG/100ML IV SOLN
500.0000 mg | Freq: Two times a day (BID) | INTRAVENOUS | Status: DC
  Administered 2022-01-11 – 2022-01-16 (×12): 500 mg via INTRAVENOUS
  Filled 2022-01-11 (×12): qty 100

## 2022-01-11 MED ORDER — OXYCODONE HCL 5 MG/5ML PO SOLN
2.5000 mg | ORAL | Status: DC | PRN
  Administered 2022-01-11 – 2022-01-18 (×25): 5 mg
  Filled 2022-01-11 (×25): qty 5
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
## 2022-01-11 NOTE — Progress Notes (Signed)
OT Cancellation Note  Patient Details Name: Dominic Smith MRN: [id number] DOB: 1940/10/13   Cancelled Treatment:    Reason Eval/Treat Not Completed: Patient at procedure or test/ unavailable (pt off unit at CT, will reattempt for OT eval as schedule permits)  Lynnda Child, OTD, OTR/L Acute Rehab [phone]   Kaylyn Lim 01/11/2022, 9:11 AM
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
## 2022-01-11 NOTE — Procedures (Signed)
Cortrak  Tube Type:  Cortrak - 43 inches Tube Location:  Left nare Initial Placement:  Stomach Secured by: Bridle Technique Used to Measure Tube Placement:  Marking at nare/corner of mouth Cortrak Secured At:  67 cm   Cortrak Tube Team Note:  Consult received to place a Cortrak feeding tube.   X-ray is required, abdominal x-ray has been ordered by the Cortrak team. Please confirm tube placement before using the Cortrak tube.   If the tube becomes dislodged please keep the tube and contact the Cortrak team at [email or web address] ([email or web address]) for replacement.  If after hours and replacement cannot be delayed, place a NG tube and confirm placement with an abdominal x-ray.   Koleen Distance MS, RD, LDN Please refer to [facility] for RD and/or RD on-call/weekend/after hours pager

## 2022-01-11 NOTE — Progress Notes (Signed)
PT Cancellation Note  Patient Details Name: Daysean Tinkham MRN: [id number] DOB: 1940-05-06   Cancelled Treatment:    Reason Eval/Treat Not Completed: Patient at procedure or test/unavailable (for head CT). RN also notes pt with increased lethargy this AM, requesting PT/OT follow-up for evaluation this afternoon. Will follow-up as schedule permits.  Mabeline Caras, PT, DPT Acute Rehabilitation Services  Personal: [facility] Office: [place] 01/11/2022, 8:57 AM
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
## 2022-01-12 LAB — CBC
HCT: 28.9 % — ABNORMAL LOW (ref 39.0–52.0)
Hemoglobin: 9.2 g/dL — ABNORMAL LOW (ref 13.0–17.0)
MCH: 30.1 pg (ref 26.0–34.0)
MCHC: 31.8 g/dL (ref 30.0–36.0)
MCV: 94.4 fL (ref 80.0–100.0)
Platelets: 112 10*3/uL — ABNORMAL LOW (ref 150–400)
RBC: 3.06 MIL/uL — ABNORMAL LOW (ref 4.22–5.81)
RDW: 14.6 % (ref 11.5–15.5)
WBC: 7 10*3/uL (ref 4.0–10.5)
nRBC: 0 % (ref 0.0–0.2)

## 2022-01-12 LAB — URINALYSIS, ROUTINE W REFLEX MICROSCOPIC
Bilirubin Urine: NEGATIVE
Glucose, UA: NEGATIVE mg/dL
Ketones, ur: NEGATIVE mg/dL
Nitrite: NEGATIVE
Protein, ur: NEGATIVE mg/dL
Specific Gravity, Urine: 1.026 (ref 1.005–1.030)
pH: 5 (ref 5.0–8.0)

## 2022-01-12 LAB — BASIC METABOLIC PANEL
Anion gap: 6 (ref 5–15)
BUN: 21 mg/dL (ref 8–23)
CO2: 20 mmol/L — ABNORMAL LOW (ref 22–32)
Calcium: 7.2 mg/dL — ABNORMAL LOW (ref 8.9–10.3)
Chloride: 119 mmol/L — ABNORMAL HIGH (ref 98–111)
Creatinine, Ser: 1.04 mg/dL (ref 0.61–1.24)
GFR, Estimated: >60 mL/min (ref >60)
Glucose, Bld: 95 mg/dL (ref 70–99)
Potassium: 3.4 mmol/L — ABNORMAL LOW (ref 3.5–5.1)
Sodium: 145 mmol/L (ref 135–145)

## 2022-01-12 LAB — MAGNESIUM: Magnesium: 2.2 mg/dL (ref 1.7–2.4)

## 2022-01-12 LAB — GLUCOSE, CAPILLARY
Glucose-Capillary: 116 mg/dL — ABNORMAL HIGH (ref 70–99)
Glucose-Capillary: 116 mg/dL — ABNORMAL HIGH (ref 70–99)
Glucose-Capillary: 116 mg/dL — ABNORMAL HIGH (ref 70–99)
Glucose-Capillary: 124 mg/dL — ABNORMAL HIGH (ref 70–99)
Glucose-Capillary: 127 mg/dL — ABNORMAL HIGH (ref 70–99)

## 2022-01-12 MED ORDER — ASPIRIN 81 MG PO CHEW: 81.0000 mg | CHEWABLE_TABLET | Freq: Every day | ORAL | Status: DC

## 2022-01-12 MED ORDER — METHOCARBAMOL 500 MG PO TABS
500.0000 mg | ORAL_TABLET | Freq: Four times a day (QID) | ORAL | Status: DC | PRN
  Administered 2022-01-13 – 2022-01-14 (×2): 500 mg via ORAL
  Filled 2022-01-12 (×3): qty 1

## 2022-01-12 MED ORDER — ASPIRIN 81 MG PO CHEW
81.0000 mg | CHEWABLE_TABLET | Freq: Every day | ORAL | Status: DC
  Administered 2022-01-13 – 2022-01-19 (×7): 81 mg
  Filled 2022-01-12 (×7): qty 1

## 2022-01-12 MED ORDER — POTASSIUM CHLORIDE 20 MEQ PO PACK
40.0000 meq | PACK | Freq: Two times a day (BID) | ORAL | Status: AC
  Administered 2022-01-12 (×2): 40 meq
  Filled 2022-01-12 (×2): qty 2
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
## 2022-01-12 NOTE — Progress Notes (Signed)
Son "[name]" notified of transfer to [id number] by phone voice mail.
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
## 2022-01-12 NOTE — Progress Notes (Signed)
Inpatient Rehab Admissions Coordinator:   Per therapy recommendations, patient was screened for CIR candidacy by Clemens Catholic, MS, CCC-SLP. At this time, Pt. is not yet tolerating OOB and not yet at a level to tolerate intensity of CIR; however,   Pt. may have potential to progress to becoming a potential CIR candidate, so CIR admissions team will follow and monitor for progress and participation with therapies and place consult order if Pt. appears to be an appropriate candidate. Please contact me with any questions.   Clemens Catholic, [place], [place] Admissions Coordinator  [phone] (Louisa) [phone] (office)
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
## 2022-01-13 LAB — CBC
HCT: 28.1 % — ABNORMAL LOW (ref 39.0–52.0)
Hemoglobin: 9.1 g/dL — ABNORMAL LOW (ref 13.0–17.0)
MCH: 30.2 pg (ref 26.0–34.0)
MCHC: 32.4 g/dL (ref 30.0–36.0)
MCV: 93.4 fL (ref 80.0–100.0)
Platelets: 109 10*3/uL — ABNORMAL LOW (ref 150–400)
RBC: 3.01 MIL/uL — ABNORMAL LOW (ref 4.22–5.81)
RDW: 14.5 % (ref 11.5–15.5)
WBC: 4.9 10*3/uL (ref 4.0–10.5)
nRBC: 0 % (ref 0.0–0.2)

## 2022-01-13 LAB — BASIC METABOLIC PANEL
Anion gap: 4 — ABNORMAL LOW (ref 5–15)
BUN: 28 mg/dL — ABNORMAL HIGH (ref 8–23)
CO2: 22 mmol/L (ref 22–32)
Calcium: 8.4 mg/dL — ABNORMAL LOW (ref 8.9–10.3)
Chloride: 116 mmol/L — ABNORMAL HIGH (ref 98–111)
Creatinine, Ser: 1.16 mg/dL (ref 0.61–1.24)
GFR, Estimated: >60 mL/min (ref >60)
Glucose, Bld: 128 mg/dL — ABNORMAL HIGH (ref 70–99)
Potassium: 4.3 mmol/L (ref 3.5–5.1)
Sodium: 142 mmol/L (ref 135–145)

## 2022-01-13 LAB — GLUCOSE, CAPILLARY
Glucose-Capillary: 112 mg/dL — ABNORMAL HIGH (ref 70–99)
Glucose-Capillary: 113 mg/dL — ABNORMAL HIGH (ref 70–99)
Glucose-Capillary: 125 mg/dL — ABNORMAL HIGH (ref 70–99)
Glucose-Capillary: 93 mg/dL (ref 70–99)

## 2022-01-13 MED ORDER — GUAIFENESIN 100 MG/5ML PO LIQD
10.0000 mL | ORAL | Status: DC
  Administered 2022-01-13 – 2022-01-19 (×35): 10 mL
  Filled 2022-01-13 (×34): qty 10

## 2022-01-13 MED ORDER — FREE WATER
100.0000 mL | Freq: Four times a day (QID) | Status: DC
  Administered 2022-01-13 – 2022-01-19 (×25): 100 mL

## 2022-01-13 MED ORDER — CHLORHEXIDINE GLUCONATE CLOTH 2 % EX PADS
6.0000 | MEDICATED_PAD | Freq: Every day | CUTANEOUS | Status: DC
  Administered 2022-01-13 – 2022-01-19 (×7): 6 via TOPICAL

## 2022-01-13 MED ORDER — POLYETHYLENE GLYCOL 3350 17 G PO PACK
17.0000 g | PACK | Freq: Every day | ORAL | Status: DC
  Administered 2022-01-13 – 2022-01-18 (×4): 17 g
  Filled 2022-01-13 (×6): qty 1
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
## 2022-01-13 NOTE — Plan of Care (Signed)
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
## 2022-01-13 NOTE — Care Management Important Message (Signed)
Important Message  Patient Details  Name: Dominic Smith MRN: [id number] Date of Birth: [birth year]   Medicare Important Message Given:  Yes     Hannah Beat 01/13/2022, 11:22 AM

## 2022-01-13 NOTE — Progress Notes (Signed)
? ?  Inpatient Rehab Admissions Coordinator : ? ?Per therapy recommendations, patient was screened for CIR candidacy by Dominique Calvey RN MSN.  At this time patient appears to be a potential candidate for CIR. I will place a rehab consult per protocol for full assessment. Please call me with any questions. ? ?Dariyon Urquilla RN MSN ?Admissions Coordinator ?[phone] ?  ?
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
## 2022-01-14 ENCOUNTER — Inpatient Hospital Stay (HOSPITAL_COMMUNITY): Payer: Medicare Other

## 2022-01-14 DIAGNOSIS — I472 Ventricular tachycardia, unspecified: Secondary | ICD-10-CM

## 2022-01-14 LAB — GLUCOSE, CAPILLARY
Glucose-Capillary: 93 mg/dL (ref 70–99)
Glucose-Capillary: 117 mg/dL — ABNORMAL HIGH (ref 70–99)
Glucose-Capillary: 114 mg/dL — ABNORMAL HIGH (ref 70–99)
Glucose-Capillary: 148 mg/dL — ABNORMAL HIGH (ref 70–99)
Glucose-Capillary: 128 mg/dL — ABNORMAL HIGH (ref 70–99)
Glucose-Capillary: 123 mg/dL — ABNORMAL HIGH (ref 70–99)
Glucose-Capillary: 118 mg/dL — ABNORMAL HIGH (ref 70–99)

## 2022-01-14 LAB — BASIC METABOLIC PANEL
Anion gap: 6 (ref 5–15)
BUN: 29 mg/dL — ABNORMAL HIGH (ref 8–23)
CO2: 22 mmol/L (ref 22–32)
Calcium: 8.3 mg/dL — ABNORMAL LOW (ref 8.9–10.3)
Chloride: 114 mmol/L — ABNORMAL HIGH (ref 98–111)
Creatinine, Ser: 1.25 mg/dL — ABNORMAL HIGH (ref 0.61–1.24)
GFR, Estimated: 57 mL/min — ABNORMAL LOW (ref >60)
Glucose, Bld: 131 mg/dL — ABNORMAL HIGH (ref 70–99)
Potassium: 3.9 mmol/L (ref 3.5–5.1)
Sodium: 142 mmol/L (ref 135–145)

## 2022-01-14 LAB — URINE CULTURE: Culture: NO GROWTH

## 2022-01-14 LAB — CBC
HCT: 32.2 % — ABNORMAL LOW (ref 39.0–52.0)
Hemoglobin: 10.4 g/dL — ABNORMAL LOW (ref 13.0–17.0)
MCH: 30.2 pg (ref 26.0–34.0)
MCHC: 32.3 g/dL (ref 30.0–36.0)
MCV: 93.6 fL (ref 80.0–100.0)
Platelets: 136 10*3/uL — ABNORMAL LOW (ref 150–400)
RBC: 3.44 MIL/uL — ABNORMAL LOW (ref 4.22–5.81)
RDW: 14.6 % (ref 11.5–15.5)
WBC: 5.3 10*3/uL (ref 4.0–10.5)
nRBC: 0 % (ref 0.0–0.2)

## 2022-01-14 LAB — ECHOCARDIOGRAM COMPLETE
AR max vel: 1.68 cm2
AV Area VTI: 1.58 cm2
AV Area mean vel: 1.63 cm2
AV Mean grad: 8 mmHg
AV Peak grad: 13.8 mmHg
Ao pk vel: 1.86 m/s
Height: 71 in
S' Lateral: 3 cm
Weight: 2472.68 oz

## 2022-01-14 MED ORDER — BISACODYL 10 MG RE SUPP
10.0000 mg | Freq: Once | RECTAL | Status: AC
  Administered 2022-01-14: 10 mg via RECTAL
  Filled 2022-01-14: qty 1

## 2022-01-14 MED ORDER — METOPROLOL TARTRATE 5 MG/5ML IV SOLN
2.5000 mg | Freq: Four times a day (QID) | INTRAVENOUS | Status: DC | PRN
  Administered 2022-01-14 – 2022-01-16 (×3): 2.5 mg via INTRAVENOUS
  Filled 2022-01-14 (×3): qty 5

## 2022-01-14 MED ORDER — METOPROLOL TARTRATE 5 MG/5ML IV SOLN
2.5000 mg | Freq: Once | INTRAVENOUS | Status: AC
  Administered 2022-01-14: 2.5 mg via INTRAVENOUS
  Filled 2022-01-14: qty 5

## 2022-01-14 MED ORDER — ACETAMINOPHEN 160 MG/5ML PO SOLN
650.0000 mg | Freq: Four times a day (QID) | ORAL | Status: DC
  Administered 2022-01-15 – 2022-01-19 (×19): 650 mg
  Filled 2022-01-14 (×19): qty 20.3

## 2022-01-14 MED ORDER — METHOCARBAMOL 500 MG PO TABS
500.0000 mg | ORAL_TABLET | Freq: Four times a day (QID) | ORAL | Status: DC | PRN
  Administered 2022-01-14 – 2022-01-17 (×4): 500 mg
  Filled 2022-01-14 (×3): qty 1
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
## 2022-01-14 NOTE — Progress Notes (Signed)
Inpatient Rehab Admissions Coordinator:   I met with pt at bedside to discuss CIR.  He is somnolent, note tacchy and febrile this morning.  I called son to discuss 3 hrs/day of therapy, goals of supervision, and ELOS 2 weeks.  Son reports he is able to take time off of work to provide 24/7 at discharge.  We discussed Medicare A benefits, and he is not aware of any supplemental/secondary insurance though they have been working on getting VA coverage as well.  We will continue to follow for potential admit pending bed availability and medical clearance.   Shann Medal, PT, DPT Admissions Coordinator [phone] 01/14/22  1:52 PM
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
## 2022-01-14 NOTE — Plan of Care (Signed)
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
## 2022-01-14 NOTE — Progress Notes (Signed)
Patient NTS at this time. No bleeding noted. Patient tolerated fairly well. Sputum sample obtained and sent to lab at this time.

## 2022-01-14 NOTE — Progress Notes (Signed)
Trauma Event Note   Patient with tachycardia ST on monitor throughout the shift, unchanged with PRNS for pain and agitation. HR up to 120s-130s when agitated. Notified Dr. Grandville Silos for yellow MEWS score.  Received Lopressor 2.5 MG ONCE VORB per Dr. Grandville Silos    01/14/22 0313  Vitals  Temp 98.5 F (36.9 C)  Temp Source Axillary  BP 132/66  BP Location Left Arm  BP Method Automatic  Patient Position (if appropriate) Lying  Pulse Rate (!) 112  Pulse Rate Source Monitor  ECG Heart Rate (!) 113  Resp 20  MEWS COLOR  MEWS Score Color Yellow  Oxygen Therapy  SpO2 98 %  O2 Device Room Air
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
## 2022-01-14 NOTE — Progress Notes (Signed)
   01/14/22 0313  Assess: MEWS Score  Temp 98.5 F (36.9 C)  BP 132/66  Pulse Rate (!) 112  ECG Heart Rate (!) 113  Resp 20  SpO2 98 %  O2 Device Room Air  Assess: MEWS Score  MEWS Temp 0  MEWS Systolic 0  MEWS Pulse 2  MEWS RR 0  MEWS LOC 1  MEWS Score 3  MEWS Score Color Yellow  Assess: if the MEWS score is Yellow or Red  Were vital signs taken at a resting state? Yes  Focused Assessment No change from prior assessment  Does the patient meet 2 or more of the SIRS criteria? Yes  Does the patient have a confirmed or suspected source of infection? No  MEWS guidelines implemented *See Row Information* No, previously yellow, continue vital signs every 4 hours  Treat  MEWS Interventions Escalated (See documentation below)  Escalate  MEWS: Escalate Yellow: discuss with charge nurse/RN and consider discussing with provider and RRT  Notify: Charge Nurse/RN  Name of Charge Nurse/RN Notified Margeret RN  Date Charge Nurse/RN Notified 01/14/22  Time Charge Nurse/RN Notified 0320  Notify: Provider  Provider Name/Title Trauma RN  Date Provider Notified 01/14/22  Time Provider Notified [phone]  Method of Notification Call  Notification Reason Other (Comment) (High heart rate)  Provider response Other (Comment);See new orders  Date of Provider Response 01/14/22  Time of Provider Response [phone]  Document  Patient Outcome Stabilized after interventions  Assess: SIRS CRITERIA  SIRS Temperature  0  SIRS Pulse 1  SIRS Respirations  0  SIRS WBC 1  SIRS Score Sum  2
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
## 2022-01-14 NOTE — Progress Notes (Signed)
Called to patients room to nasal tracheal sx retrieved small amount of white sputum.  Deep oral sx performed as well.

## 2022-01-15 ENCOUNTER — Other Ambulatory Visit: Payer: Self-pay

## 2022-01-15 ENCOUNTER — Encounter (HOSPITAL_COMMUNITY): Payer: Self-pay | Admitting: General Surgery

## 2022-01-15 LAB — CBC
HCT: 26.9 % — ABNORMAL LOW (ref 39.0–52.0)
Hemoglobin: 8.8 g/dL — ABNORMAL LOW (ref 13.0–17.0)
MCH: 29.9 pg (ref 26.0–34.0)
MCHC: 32.7 g/dL (ref 30.0–36.0)
MCV: 91.5 fL (ref 80.0–100.0)
Platelets: 145 10*3/uL — ABNORMAL LOW (ref 150–400)
RBC: 2.94 MIL/uL — ABNORMAL LOW (ref 4.22–5.81)
RDW: 14.7 % (ref 11.5–15.5)
WBC: 4.5 10*3/uL (ref 4.0–10.5)
nRBC: 0 % (ref 0.0–0.2)

## 2022-01-15 LAB — BASIC METABOLIC PANEL
Anion gap: 8 (ref 5–15)
BUN: 30 mg/dL — ABNORMAL HIGH (ref 8–23)
CO2: 21 mmol/L — ABNORMAL LOW (ref 22–32)
Calcium: 8.5 mg/dL — ABNORMAL LOW (ref 8.9–10.3)
Chloride: 114 mmol/L — ABNORMAL HIGH (ref 98–111)
Creatinine, Ser: 1.11 mg/dL (ref 0.61–1.24)
GFR, Estimated: >60 mL/min (ref >60)
Glucose, Bld: 130 mg/dL — ABNORMAL HIGH (ref 70–99)
Potassium: 3.6 mmol/L (ref 3.5–5.1)
Sodium: 143 mmol/L (ref 135–145)

## 2022-01-15 LAB — GLUCOSE, CAPILLARY
Glucose-Capillary: 118 mg/dL — ABNORMAL HIGH (ref 70–99)
Glucose-Capillary: 102 mg/dL — ABNORMAL HIGH (ref 70–99)
Glucose-Capillary: 130 mg/dL — ABNORMAL HIGH (ref 70–99)
Glucose-Capillary: 121 mg/dL — ABNORMAL HIGH (ref 70–99)
Glucose-Capillary: 114 mg/dL — ABNORMAL HIGH (ref 70–99)
Glucose-Capillary: 122 mg/dL — ABNORMAL HIGH (ref 70–99)
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
## 2022-01-15 NOTE — Progress Notes (Signed)
   01/15/22 0336  Assess: MEWS Score  Temp 97.8 F (36.6 C)  BP (!) 149/82  MAP (mmHg) 102  Pulse Rate (!) 117  ECG Heart Rate (!) 119  Resp (!) 23  Level of Consciousness Alert  SpO2 94 %  O2 Device Nasal Cannula  O2 Flow Rate (L/min) 2 L/min  Assess: MEWS Score  MEWS Temp 0  MEWS Systolic 0  MEWS Pulse 2  MEWS RR 1  MEWS LOC 0  MEWS Score 3  MEWS Score Color Yellow  Assess: if the MEWS score is Yellow or Red  Were vital signs taken at a resting state? Yes  Focused Assessment No change from prior assessment  Does the patient meet 2 or more of the SIRS criteria? Yes  Does the patient have a confirmed or suspected source of infection? No  MEWS guidelines implemented *See Row Information* Yes  Treat  MEWS Interventions Administered scheduled meds/treatments  Take Vital Signs  Increase Vital Sign Frequency  Yellow: Q 2hr X 2 then Q 4hr X 2, if remains yellow, continue Q 4hrs  Escalate  MEWS: Escalate Yellow: discuss with charge nurse/RN and consider discussing with provider and RRT  Notify: Charge Nurse/RN  Name of Charge Nurse/RN Notified Gladys,RN  Date Charge Nurse/RN Notified 01/15/22  Time Charge Nurse/RN Notified 0357  Document  Patient Outcome Stabilized after interventions  Progress note created (see row info) Yes  Assess: SIRS CRITERIA  SIRS Temperature  0  SIRS Pulse 1  SIRS Respirations  1  SIRS WBC 1  SIRS Score Sum  3

## 2022-01-15 NOTE — Plan of Care (Signed)

## 2022-01-16 LAB — BASIC METABOLIC PANEL
Anion gap: 9 (ref 5–15)
BUN: 31 mg/dL — ABNORMAL HIGH (ref 8–23)
CO2: 18 mmol/L — ABNORMAL LOW (ref 22–32)
Calcium: 8.1 mg/dL — ABNORMAL LOW (ref 8.9–10.3)
Chloride: 118 mmol/L — ABNORMAL HIGH (ref 98–111)
Creatinine, Ser: 1.07 mg/dL (ref 0.61–1.24)
GFR, Estimated: >60 mL/min (ref >60)
Glucose, Bld: 149 mg/dL — ABNORMAL HIGH (ref 70–99)
Potassium: 3.5 mmol/L (ref 3.5–5.1)
Sodium: 145 mmol/L (ref 135–145)

## 2022-01-16 LAB — GLUCOSE, CAPILLARY
Glucose-Capillary: 106 mg/dL — ABNORMAL HIGH (ref 70–99)
Glucose-Capillary: 113 mg/dL — ABNORMAL HIGH (ref 70–99)
Glucose-Capillary: 124 mg/dL — ABNORMAL HIGH (ref 70–99)
Glucose-Capillary: 131 mg/dL — ABNORMAL HIGH (ref 70–99)
Glucose-Capillary: 142 mg/dL — ABNORMAL HIGH (ref 70–99)
Glucose-Capillary: 98 mg/dL (ref 70–99)

## 2022-01-16 LAB — CBC
HCT: 26.9 % — ABNORMAL LOW (ref 39.0–52.0)
Hemoglobin: 8.8 g/dL — ABNORMAL LOW (ref 13.0–17.0)
MCH: 30.1 pg (ref 26.0–34.0)
MCHC: 32.7 g/dL (ref 30.0–36.0)
MCV: 92.1 fL (ref 80.0–100.0)
Platelets: 214 10*3/uL (ref 150–400)
RBC: 2.92 MIL/uL — ABNORMAL LOW (ref 4.22–5.81)
RDW: 14.6 % (ref 11.5–15.5)
WBC: 5.4 10*3/uL (ref 4.0–10.5)
nRBC: 0 % (ref 0.0–0.2)

## 2022-01-16 MED ORDER — PIPERACILLIN-TAZOBACTAM 3.375 G IVPB
3.3750 g | Freq: Three times a day (TID) | INTRAVENOUS | Status: DC
  Administered 2022-01-16: 3.375 g via INTRAVENOUS
  Filled 2022-01-16 (×2): qty 50
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
## 2022-01-17 LAB — GLUCOSE, CAPILLARY
Glucose-Capillary: 119 mg/dL — ABNORMAL HIGH (ref 70–99)
Glucose-Capillary: 125 mg/dL — ABNORMAL HIGH (ref 70–99)
Glucose-Capillary: 130 mg/dL — ABNORMAL HIGH (ref 70–99)
Glucose-Capillary: 137 mg/dL — ABNORMAL HIGH (ref 70–99)
Glucose-Capillary: 117 mg/dL — ABNORMAL HIGH (ref 70–99)

## 2022-01-17 LAB — CULTURE, RESPIRATORY W GRAM STAIN

## 2022-01-17 LAB — MRSA NEXT GEN BY PCR, NASAL: MRSA by PCR Next Gen: DETECTED — AB

## 2022-01-17 MED ORDER — ENOXAPARIN SODIUM 30 MG/0.3ML IJ SOSY: 30.0000 mg | PREFILLED_SYRINGE | INTRAMUSCULAR | Status: DC

## 2022-01-17 MED ORDER — MUPIROCIN 2 % EX OINT
1.0000 | TOPICAL_OINTMENT | Freq: Two times a day (BID) | CUTANEOUS | Status: DC
  Administered 2022-01-17 – 2022-01-19 (×6): 1 via NASAL
  Filled 2022-01-17 (×3): qty 22

## 2022-01-17 MED ORDER — ENOXAPARIN SODIUM 30 MG/0.3ML IJ SOSY
30.0000 mg | PREFILLED_SYRINGE | Freq: Two times a day (BID) | INTRAMUSCULAR | Status: DC
  Administered 2022-01-17 – 2022-01-19 (×4): 30 mg via SUBCUTANEOUS
  Filled 2022-01-17 (×4): qty 0.3

## 2022-01-17 MED ORDER — VANCOMYCIN HCL 1250 MG/250ML IV SOLN
1250.0000 mg | INTRAVENOUS | Status: DC
  Administered 2022-01-18 – 2022-01-19 (×2): 1250 mg via INTRAVENOUS
  Filled 2022-01-17 (×2): qty 250

## 2022-01-17 MED ORDER — DOXAZOSIN MESYLATE 2 MG PO TABS
2.0000 mg | ORAL_TABLET | Freq: Every day | ORAL | Status: DC
  Administered 2022-01-17 – 2022-01-19 (×3): 2 mg
  Filled 2022-01-17 (×3): qty 1

## 2022-01-17 MED ORDER — VANCOMYCIN HCL 1500 MG/300ML IV SOLN
1500.0000 mg | Freq: Once | INTRAVENOUS | Status: AC
  Administered 2022-01-17: 1500 mg via INTRAVENOUS
  Filled 2022-01-17: qty 300

## 2022-01-17 MED ORDER — LEVETIRACETAM 500 MG PO TABS
500.0000 mg | ORAL_TABLET | Freq: Two times a day (BID) | ORAL | Status: AC
  Administered 2022-01-17 (×2): 500 mg
  Filled 2022-01-17 (×2): qty 1

## 2022-01-17 MED ORDER — SODIUM CHLORIDE 0.9 % IV SOLN
2.0000 g | Freq: Three times a day (TID) | INTRAVENOUS | Status: DC
  Administered 2022-01-17 – 2022-01-19 (×8): 2 g via INTRAVENOUS
  Filled 2022-01-17 (×9): qty 2
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
## 2022-01-17 NOTE — Progress Notes (Signed)
Inpatient Rehab Admissions Coordinator:    Visited with patient this morning. We are continuing to follow for potential CIR admission when patient is medically ready and we have bed available.  Rehab Admissons Coordinator [place], [place], [place] [phone]
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
## 2022-01-18 LAB — GLUCOSE, CAPILLARY
Glucose-Capillary: 107 mg/dL — ABNORMAL HIGH (ref 70–99)
Glucose-Capillary: 110 mg/dL — ABNORMAL HIGH (ref 70–99)
Glucose-Capillary: 112 mg/dL — ABNORMAL HIGH (ref 70–99)
Glucose-Capillary: 113 mg/dL — ABNORMAL HIGH (ref 70–99)
Glucose-Capillary: 123 mg/dL — ABNORMAL HIGH (ref 70–99)
Glucose-Capillary: 131 mg/dL — ABNORMAL HIGH (ref 70–99)
Glucose-Capillary: 138 mg/dL — ABNORMAL HIGH (ref 70–99)

## 2022-01-18 LAB — BASIC METABOLIC PANEL
Anion gap: 5 (ref 5–15)
BUN: 31 mg/dL — ABNORMAL HIGH (ref 8–23)
CO2: 21 mmol/L — ABNORMAL LOW (ref 22–32)
Calcium: 7.9 mg/dL — ABNORMAL LOW (ref 8.9–10.3)
Chloride: 116 mmol/L — ABNORMAL HIGH (ref 98–111)
Creatinine, Ser: 0.93 mg/dL (ref 0.61–1.24)
GFR, Estimated: >60 mL/min (ref >60)
Glucose, Bld: 123 mg/dL — ABNORMAL HIGH (ref 70–99)
Potassium: 4.1 mmol/L (ref 3.5–5.1)
Sodium: 142 mmol/L (ref 135–145)

## 2022-01-18 MED ORDER — FUROSEMIDE 10 MG/ML IJ SOLN
40.0000 mg | Freq: Once | INTRAMUSCULAR | Status: AC
  Administered 2022-01-18: 40 mg via INTRAVENOUS
  Filled 2022-01-18: qty 4

## 2022-01-18 NOTE — Progress Notes (Signed)
Inpatient Rehab Coordinator Note:  Spoke with patient's son, [name] [phone]) via telephone to discuss CIR recommendations and goals/expectations of CIR stay.  We reviewed 3 hrs/day of therapy, physician follow up, and average length of stay 2 weeks (dependent upon progress) with goals of mod I. Son reports he and his mother will be able to provide assistance/supervision when patient is discharged, patient would be returning to his home where he lives with roommates.  Will continue to follow for medical readiness and bed availability.   Rehab Admissons Coordinator [place], [place], [place] [phone]
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
## 2022-01-19 ENCOUNTER — Inpatient Hospital Stay (HOSPITAL_COMMUNITY): Payer: Medicare Other

## 2022-01-19 ENCOUNTER — Encounter (HOSPITAL_COMMUNITY): Payer: Self-pay | Admitting: Physical Medicine & Rehabilitation

## 2022-01-19 ENCOUNTER — Inpatient Hospital Stay (HOSPITAL_COMMUNITY)
Admission: RE | Admit: 2022-01-19 | Discharge: 2022-03-08 | DRG: 945 | Disposition: A | Discharge status: Skilled Nursing Facility | Payer: Medicare Other | Source: Intra-hospital | Attending: Physical Medicine & Rehabilitation | Admitting: Physical Medicine & Rehabilitation

## 2022-01-19 DIAGNOSIS — I350 Nonrheumatic aortic (valve) stenosis: Secondary | ICD-10-CM | POA: Diagnosis present

## 2022-01-19 DIAGNOSIS — Z79899 Other long term (current) drug therapy: Secondary | ICD-10-CM

## 2022-01-19 DIAGNOSIS — S42031D Displaced fracture of lateral end of right clavicle, subsequent encounter for fracture with routine healing: Secondary | ICD-10-CM | POA: Diagnosis not present

## 2022-01-19 DIAGNOSIS — S0101XD Laceration without foreign body of scalp, subsequent encounter: Secondary | ICD-10-CM | POA: Diagnosis not present

## 2022-01-19 DIAGNOSIS — R339 Retention of urine, unspecified: Secondary | ICD-10-CM | POA: Diagnosis present

## 2022-01-19 DIAGNOSIS — R531 Weakness: Secondary | ICD-10-CM

## 2022-01-19 DIAGNOSIS — S069XAA Unspecified intracranial injury with loss of consciousness status unknown, initial encounter: Secondary | ICD-10-CM | POA: Diagnosis present

## 2022-01-19 DIAGNOSIS — K521 Toxic gastroenteritis and colitis: Secondary | ICD-10-CM | POA: Diagnosis present

## 2022-01-19 DIAGNOSIS — F0390 Unspecified dementia without behavioral disturbance: Secondary | ICD-10-CM | POA: Diagnosis present

## 2022-01-19 DIAGNOSIS — J90 Pleural effusion, not elsewhere classified: Secondary | ICD-10-CM | POA: Diagnosis not present

## 2022-01-19 DIAGNOSIS — J15212 Pneumonia due to Methicillin resistant Staphylococcus aureus: Secondary | ICD-10-CM | POA: Diagnosis present

## 2022-01-19 DIAGNOSIS — N179 Acute kidney failure, unspecified: Secondary | ICD-10-CM | POA: Diagnosis present

## 2022-01-19 DIAGNOSIS — Z781 Physical restraint status: Secondary | ICD-10-CM | POA: Diagnosis not present

## 2022-01-19 DIAGNOSIS — J189 Pneumonia, unspecified organism: Secondary | ICD-10-CM | POA: Diagnosis present

## 2022-01-19 DIAGNOSIS — E876 Hypokalemia: Secondary | ICD-10-CM | POA: Diagnosis present

## 2022-01-19 DIAGNOSIS — S42291A Other displaced fracture of upper end of right humerus, initial encounter for closed fracture: Secondary | ICD-10-CM

## 2022-01-19 DIAGNOSIS — E869 Volume depletion, unspecified: Secondary | ICD-10-CM | POA: Diagnosis not present

## 2022-01-19 DIAGNOSIS — D62 Acute posthemorrhagic anemia: Secondary | ICD-10-CM | POA: Diagnosis present

## 2022-01-19 DIAGNOSIS — I10 Essential (primary) hypertension: Secondary | ICD-10-CM | POA: Diagnosis present

## 2022-01-19 DIAGNOSIS — E162 Hypoglycemia, unspecified: Secondary | ICD-10-CM | POA: Diagnosis present

## 2022-01-19 DIAGNOSIS — S27892D Contusion of other specified intrathoracic organs, subsequent encounter: Secondary | ICD-10-CM

## 2022-01-19 DIAGNOSIS — Y95 Nosocomial condition: Secondary | ICD-10-CM | POA: Diagnosis present

## 2022-01-19 DIAGNOSIS — I471 Supraventricular tachycardia, unspecified: Secondary | ICD-10-CM | POA: Diagnosis present

## 2022-01-19 DIAGNOSIS — S42301D Unspecified fracture of shaft of humerus, right arm, subsequent encounter for fracture with routine healing: Secondary | ICD-10-CM

## 2022-01-19 DIAGNOSIS — S069X0A Unspecified intracranial injury without loss of consciousness, initial encounter: Secondary | ICD-10-CM

## 2022-01-19 DIAGNOSIS — E8770 Fluid overload, unspecified: Secondary | ICD-10-CM | POA: Diagnosis present

## 2022-01-19 DIAGNOSIS — E43 Unspecified severe protein-calorie malnutrition: Secondary | ICD-10-CM

## 2022-01-19 DIAGNOSIS — S12500D Unspecified displaced fracture of sixth cervical vertebra, subsequent encounter for fracture with routine healing: Secondary | ICD-10-CM

## 2022-01-19 DIAGNOSIS — L89150 Pressure ulcer of sacral region, unstageable: Secondary | ICD-10-CM | POA: Diagnosis present

## 2022-01-19 DIAGNOSIS — R7401 Elevation of levels of liver transaminase levels: Secondary | ICD-10-CM | POA: Diagnosis present

## 2022-01-19 DIAGNOSIS — R7989 Other specified abnormal findings of blood chemistry: Secondary | ICD-10-CM

## 2022-01-19 DIAGNOSIS — K5900 Constipation, unspecified: Secondary | ICD-10-CM | POA: Diagnosis not present

## 2022-01-19 DIAGNOSIS — Z515 Encounter for palliative care: Secondary | ICD-10-CM

## 2022-01-19 DIAGNOSIS — S066XAD Traumatic subarachnoid hemorrhage with loss of consciousness status unknown, subsequent encounter: Secondary | ICD-10-CM | POA: Diagnosis present

## 2022-01-19 DIAGNOSIS — T3695XA Adverse effect of unspecified systemic antibiotic, initial encounter: Secondary | ICD-10-CM | POA: Diagnosis present

## 2022-01-19 DIAGNOSIS — S069X0S Unspecified intracranial injury without loss of consciousness, sequela: Secondary | ICD-10-CM

## 2022-01-19 LAB — GLUCOSE, CAPILLARY
Glucose-Capillary: 113 mg/dL — ABNORMAL HIGH (ref 70–99)
Glucose-Capillary: 111 mg/dL — ABNORMAL HIGH (ref 70–99)
Glucose-Capillary: 108 mg/dL — ABNORMAL HIGH (ref 70–99)
Glucose-Capillary: 101 mg/dL — ABNORMAL HIGH (ref 70–99)
Glucose-Capillary: 100 mg/dL — ABNORMAL HIGH (ref 70–99)

## 2022-01-19 LAB — CBC
HCT: 27 % — ABNORMAL LOW (ref 39.0–52.0)
Hemoglobin: 9.1 g/dL — ABNORMAL LOW (ref 13.0–17.0)
MCH: 30.2 pg (ref 26.0–34.0)
MCHC: 33.7 g/dL (ref 30.0–36.0)
MCV: 89.7 fL (ref 80.0–100.0)
Platelets: 349 10*3/uL (ref 150–400)
RBC: 3.01 MIL/uL — ABNORMAL LOW (ref 4.22–5.81)
RDW: 14.9 % (ref 11.5–15.5)
WBC: 11 10*3/uL — ABNORMAL HIGH (ref 4.0–10.5)
nRBC: 0 % (ref 0.0–0.2)

## 2022-01-19 LAB — BASIC METABOLIC PANEL
Anion gap: 10 (ref 5–15)
BUN: 28 mg/dL — ABNORMAL HIGH (ref 8–23)
CO2: 23 mmol/L (ref 22–32)
Calcium: 8.2 mg/dL — ABNORMAL LOW (ref 8.9–10.3)
Chloride: 110 mmol/L (ref 98–111)
Creatinine, Ser: 0.95 mg/dL (ref 0.61–1.24)
GFR, Estimated: >60 mL/min (ref >60)
Glucose, Bld: 119 mg/dL — ABNORMAL HIGH (ref 70–99)
Potassium: 3.7 mmol/L (ref 3.5–5.1)
Sodium: 143 mmol/L (ref 135–145)

## 2022-01-19 MED ORDER — LOPERAMIDE HCL 1 MG/7.5ML PO SUSP
2.0000 mg | ORAL | Status: DC | PRN
  Administered 2022-01-19: 2 mg via ORAL
  Filled 2022-01-19: qty 15

## 2022-01-19 MED ORDER — DOXAZOSIN MESYLATE 2 MG PO TABS
2.0000 mg | ORAL_TABLET | Freq: Every day | ORAL | Status: DC
  Administered 2022-01-20 – 2022-01-25 (×6): 2 mg
  Filled 2022-01-19 (×7): qty 1

## 2022-01-19 MED ORDER — PROCHLORPERAZINE EDISYLATE 10 MG/2ML IJ SOLN: 5.0000 mg | Freq: Four times a day (QID) | INTRAMUSCULAR | Status: DC | PRN

## 2022-01-19 MED ORDER — PROCHLORPERAZINE 25 MG RE SUPP: 12.5000 mg | Freq: Four times a day (QID) | RECTAL | Status: DC | PRN

## 2022-01-19 MED ORDER — CHLORHEXIDINE GLUCONATE CLOTH 2 % EX PADS
6.0000 | MEDICATED_PAD | Freq: Every day | CUTANEOUS | Status: DC
  Administered 2022-01-20 – 2022-01-26 (×7): 6 via TOPICAL

## 2022-01-19 MED ORDER — MUPIROCIN 2 % EX OINT
1.0000 | TOPICAL_OINTMENT | Freq: Two times a day (BID) | CUTANEOUS | Status: AC
  Administered 2022-01-20 – 2022-01-21 (×3): 1 via NASAL
  Filled 2022-01-19: qty 22

## 2022-01-19 MED ORDER — POLYETHYLENE GLYCOL 3350 17 G PO PACK: 17.0000 g | PACK | Freq: Every day | ORAL | Status: DC

## 2022-01-19 MED ORDER — GERHARDT'S BUTT CREAM
TOPICAL_CREAM | Freq: Two times a day (BID) | CUTANEOUS | Status: DC
  Filled 2022-01-19 (×2): qty 1

## 2022-01-19 MED ORDER — OSMOLITE 1.5 CAL PO LIQD
1000.0000 mL | ORAL | Status: DC
  Administered 2022-01-20 – 2022-01-31 (×9): 1000 mL
  Filled 2022-01-19 (×11): qty 1000

## 2022-01-19 MED ORDER — ACETAMINOPHEN 325 MG PO TABS
325.0000 mg | ORAL_TABLET | ORAL | Status: DC | PRN
  Administered 2022-01-20: 650 mg via ORAL
  Filled 2022-01-19: qty 2

## 2022-01-19 MED ORDER — FREE WATER
100.0000 mL | Freq: Four times a day (QID) | Status: DC
  Administered 2022-01-19: 100 mL

## 2022-01-19 MED ORDER — SODIUM CHLORIDE 0.9 % IV SOLN
2.0000 g | Freq: Three times a day (TID) | INTRAVENOUS | Status: DC
  Administered 2022-01-19 – 2022-01-20 (×3): 2 g via INTRAVENOUS
  Filled 2022-01-19 (×4): qty 12.5

## 2022-01-19 MED ORDER — GERHARDT'S BUTT CREAM
TOPICAL_CREAM | Freq: Two times a day (BID) | CUTANEOUS | Status: DC
  Administered 2022-02-11 – 2022-03-02 (×4): 1 via TOPICAL
  Filled 2022-01-19 (×3): qty 1

## 2022-01-19 MED ORDER — BACITRACIN ZINC 500 UNIT/GM EX OINT
TOPICAL_OINTMENT | Freq: Every day | CUTANEOUS | Status: DC
  Filled 2022-01-19: qty 28.4

## 2022-01-19 MED ORDER — ENOXAPARIN SODIUM 30 MG/0.3ML IJ SOSY
30.0000 mg | PREFILLED_SYRINGE | Freq: Two times a day (BID) | INTRAMUSCULAR | Status: DC
  Administered 2022-01-19 – 2022-03-08 (×93): 30 mg via SUBCUTANEOUS
  Filled 2022-01-19 (×95): qty 0.3

## 2022-01-19 MED ORDER — ASPIRIN 81 MG PO CHEW
81.0000 mg | CHEWABLE_TABLET | Freq: Every day | ORAL | Status: DC
  Administered 2022-01-20 – 2022-02-10 (×22): 81 mg
  Filled 2022-01-19 (×23): qty 1

## 2022-01-19 MED ORDER — CALCIUM POLYCARBOPHIL 625 MG PO TABS
625.0000 mg | ORAL_TABLET | Freq: Two times a day (BID) | ORAL | Status: DC
  Administered 2022-01-19 – 2022-01-20 (×2): 625 mg via ORAL
  Filled 2022-01-19 (×2): qty 1

## 2022-01-19 MED ORDER — VANCOMYCIN HCL 1250 MG/250ML IV SOLN
1250.0000 mg | INTRAVENOUS | Status: AC
  Administered 2022-01-20 – 2022-01-27 (×8): 1250 mg via INTRAVENOUS
  Filled 2022-01-19 (×8): qty 250

## 2022-01-19 MED ORDER — FUROSEMIDE 10 MG/ML IJ SOLN
40.0000 mg | Freq: Once | INTRAMUSCULAR | Status: AC
  Administered 2022-01-19: 40 mg via INTRAVENOUS
  Filled 2022-01-19: qty 4

## 2022-01-19 MED ORDER — PROCHLORPERAZINE MALEATE 5 MG PO TABS
5.0000 mg | ORAL_TABLET | Freq: Four times a day (QID) | ORAL | Status: DC | PRN
  Administered 2022-01-31: 10 mg via ORAL
  Filled 2022-01-19: qty 2

## 2022-01-19 MED ORDER — SODIUM CHLORIDE 0.9 % IV SOLN
2.0000 g | Freq: Three times a day (TID) | INTRAVENOUS | Status: DC
  Filled 2022-01-19 (×2): qty 2

## 2022-01-19 MED ORDER — METHOCARBAMOL 500 MG PO TABS
500.0000 mg | ORAL_TABLET | Freq: Four times a day (QID) | ORAL | Status: DC | PRN
  Administered 2022-01-20 – 2022-01-31 (×3): 500 mg
  Filled 2022-01-19 (×3): qty 1

## 2022-01-19 MED ORDER — ALUM & MAG HYDROXIDE-SIMETH 200-200-20 MG/5ML PO SUSP
30.0000 mL | ORAL | Status: DC | PRN
  Filled 2022-01-19: qty 30

## 2022-01-19 MED ORDER — DIPHENOXYLATE-ATROPINE 2.5-0.025 MG PO TABS
1.0000 | ORAL_TABLET | Freq: Four times a day (QID) | ORAL | Status: DC | PRN
  Administered 2022-01-20 – 2022-01-25 (×5): 1
  Filled 2022-01-19 (×5): qty 1

## 2022-01-19 MED ORDER — OXYCODONE HCL 5 MG/5ML PO SOLN
2.5000 mg | ORAL | Status: DC | PRN
  Administered 2022-01-19: 5 mg
  Administered 2022-01-20: 2.5 mg
  Administered 2022-01-30 – 2022-02-08 (×3): 5 mg
  Filled 2022-01-19 (×5): qty 5

## 2022-01-19 MED ORDER — GUAIFENESIN 100 MG/5ML PO LIQD
10.0000 mL | ORAL | Status: DC
  Administered 2022-01-19 – 2022-02-07 (×108): 10 mL
  Filled 2022-01-19 (×103): qty 10

## 2022-01-19 MED ORDER — ENOXAPARIN SODIUM 30 MG/0.3ML IJ SOSY: 30.0000 mg | PREFILLED_SYRINGE | Freq: Two times a day (BID) | INTRAMUSCULAR | Status: DC

## 2022-01-19 MED ORDER — FREE WATER
150.0000 mL | Freq: Four times a day (QID) | Status: DC
  Administered 2022-01-20 – 2022-01-26 (×26): 150 mL

## 2022-01-19 MED ORDER — DOCUSATE SODIUM 50 MG/5ML PO LIQD: 100.0000 mg | Freq: Two times a day (BID) | ORAL | Status: DC

## 2022-01-19 MED ORDER — PROSOURCE TF20 ENFIT COMPATIBL EN LIQD
60.0000 mL | Freq: Every day | ENTERAL | Status: DC
  Administered 2022-01-20 – 2022-01-26 (×7): 60 mL
  Filled 2022-01-19 (×7): qty 60

## 2022-01-19 NOTE — Progress Notes (Signed)
Called report to rehab [place]. Pt to be transported via bed to 4W[facility]10.
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
## 2022-01-19 NOTE — Progress Notes (Signed)
SLP Cancellation Note  Patient Details Name: Dominic Smith MRN: [id number] DOB: 01/23/1941   Cancelled treatment:       Reason Eval/Treat Not Completed: Other (comment). Attempted a visit, pt getting a bath. Talked with PA that there was no MBS planned for this week. Given ongoing struggle to manage secretions, extra time with cortrak as pt transitions to CIR would be beneficial.    Deklynn Charlet, Katherene Ponto 01/19/2022, 12:31 PM

## 2022-01-19 NOTE — Progress Notes (Signed)
1741 pt bed alarm activated and pt immediately found on floor. Pt said he hit his head. Pt condition baseline. No new findings at this time. Vitals obtained and PA Olin Hauser at bedside. New orders received. Post fall protocol implemented. Son Conway notified. Questions answered appropriately.  Sheela Stack, LPN

## 2022-01-19 NOTE — Progress Notes (Incomplete)
Notified  Tele monitoring system regarding placing Tele-monitor in patient room,writer was informed that patient is being placed on a waiting list due to no availability of monitor at this time. Assigned nurse and NT informed , patient bed alarm on middle alarm, continue staff rounding.

## 2022-01-19 NOTE — Progress Notes (Signed)
Patient found on the ground next to his bed. C-collar in place. Mumbling incoherently at times. Able to state his name and place as hospital and language of confusion. Unable to elicit any reports of pain. Pain noted with attempts at repositioning of RUE -still in sling but due to humerus Fx. Moves BLE some stiffness right knee but no indications of pain  Lift used to place patient back in bed and note that he was lying in a large pool of liquid stool. He is on tube feeds and on colace-->will d/c and add fiber. Likely due to antibiotics. No odor doubt c diff at this time. Will continue to trend WBC, temp curve and changes in stool. Has been on [place] since 09/25.   Weak cough noted with lethargy and inability to keep eyes open. Wet, congested sounds but has had difficulty managing secretions per ST notes with likely ongoing aspiration. CXR  ordered for follow up.  Unable to follow commands and question if mentation worse or at baseline. Will consult Hospitalist also for input.

## 2022-01-19 NOTE — Progress Notes (Addendum)
Patient arrived to unit, following commands but states we are at [facility], month is July. Able to state name. Cervical collar is on and aligned, TF running. Patient resting comfortably, denies pain. Noted and dressed 6 abrasions + scattered smaller abrasions on top of head. Pressure injury to sacrum. BL feet bruised on the side

## 2022-01-19 NOTE — Progress Notes (Signed)
Bilateral hips, spine and paraspinal palpated---no deformity or pain noted. Collar in place and neck in alinement. Discussed with Dr. Merceda Elks. CT head and neck appropriate unless patient indicating any other pain/symptoms.
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
## 2022-01-19 NOTE — Progress Notes (Signed)
Inpatient Rehabilitation Admission Medication Review by a Pharmacist  A complete drug regimen review was completed for this patient to identify any potential clinically significant medication issues.  High Risk Drug Classes Is patient taking? Indication by Medication  Antipsychotic Yes Compazine- prn nausea  Anticoagulant Yes Lovenox- VTE prophylaxis  Antibiotic Yes, as an intravenous medication Ceftazidime and vancomycin- Pneumonia  Opioid Yes Oxycodone- prn pain  Antiplatelet Yes ASA- antiplatelet  Hypoglycemics/insulin No   Vasoactive Medication No   Chemotherapy No   Other No Cardura- urinary retention     Type of Medication Issue Identified Description of Issue Recommendation(s)  Drug Interaction(s) (clinically significant)     Duplicate Therapy     Allergy     No Medication Administration End Date     Incorrect Dose     Additional Drug Therapy Needed     Significant med changes from prior encounter (inform family/care partners about these prior to discharge).    Other       Clinically significant medication issues were identified that warrant physician communication and completion of prescribed/recommended actions by midnight of the next day:  No  Name of provider notified for urgent issues identified:   Provider Method of Notification:    Pharmacist comments:   Time spent performing this drug regimen review (minutes):  [place], [place] Clinical Pharmacist 01/19/2022 4:32 PM

## 2022-01-19 NOTE — Progress Notes (Signed)
Inpatient Rehabilitation Admissions Coordinator   I met with patient at bedside and then spoke with his son, Dominic Smith, by phone. We will admit to CIR today. Dominic Smith has an appointment for Friday with palliative at 10 am to discuss goals of care and further plans after CIR. Dominic Smith states patient was completely independent prior to this admit and a lot is overwhelming on next steps. I will let CIR SW know of his concerns. Acute team and TOC made aware and I will make the arrangements to admit today.  Danne Baxter, RN, MSN Rehab Admissions Coordinator [phone] 01/19/2022 11:22 AM

## 2022-01-19 NOTE — Progress Notes (Signed)
01/19/22 1741  What Happened  Was fall witnessed? No  Was patient injured? Unsure (pt verbalized he hit his head)  Patient found on floor  Found by Staff-comment (NT)  Stated prior activity other (comment) (1720 pain addressed and given immodium for frequent stools)  Follow Up  MD notified Rinaldo Cloud [facility]  Time MD notified [phone]  Family notified Yes - comment Judie Bonus)  Time family notified 1815  Additional tests Yes-comment (XR/CT)  Simple treatment Other (comment) (Pain meds administered)  Progress note created (see row info) Yes  Adult Fall Risk Assessment  Risk Factor Category (scoring not indicated) Fall has occurred during this admission (document High fall risk)  Age 81  Fall History: Fall within 6 months prior to admission 5  Elimination; Bowel and/or Urine Incontinence 2  Elimination; Bowel and/or Urine Urgency/Frequency 2  Medications: includes PCA/Opiates, Anti-convulsants, Anti-hypertensives, Diuretics, Hypnotics, Laxatives, Sedatives, and Psychotropics 5  Patient Care Equipment 2  Mobility-Assistance 2  Mobility-Gait 2  Mobility-Sensory Deficit 2  Altered awareness of immediate physical environment 1  Impulsiveness 2  Lack of understanding of one's physical/cognitive limitations 4  Total Score 32  Patient Fall Risk Level High fall risk  Adult Fall Risk Interventions  Required Bundle Interventions *See Row Information* High fall risk - low, moderate, and high requirements implemented  Additional Interventions Camera surveillance (with patient/family notification & education)  Screening for Fall Injury Risk (To be completed on HIGH fall risk patients) - Assessing Need for Floor Mats  Risk For Fall Injury- Criteria for Floor Mats Previous fall this admission  Will Implement Floor Mats Yes  Vitals  Temp 99.1 F (37.3 C)  Temp Source Axillary  BP (!) 130/59  MAP (mmHg) 78  BP Location Left Arm  BP Method Automatic  Patient Position (if appropriate) Lying  Pulse  Rate 100  Pulse Rate Source Monitor  Resp 19  Oxygen Therapy  SpO2 95 %  O2 Device Room Air  Pain Assessment  Pain Scale Faces  Faces Pain Scale 10  Pain Type Acute pain  Pain Location Generalized  Neurological  Neuro (WDL) X (No new findings)  Musculoskeletal  Musculoskeletal (WDL) X (no new findings)  Integumentary  Integumentary (WDL) X  Abrasion Location Head (Scant serosanguineous drainage noted)  Abrasion Location Orientation Upper     01/19/22 1741  What Happened  Was fall witnessed? No  Was patient injured? Unsure (pt verbalized he hit his head)  Patient found on floor  Found by Staff-comment (NT)  Stated prior activity other (comment) (1720 pain addressed and given immodium for frequent stools)  Follow Up  MD notified Rinaldo Cloud [facility]  Time MD notified [phone]  Family notified Yes - comment Judie Bonus)  Time family notified 1815  Additional tests Yes-comment (XR/CT)  Simple treatment Other (comment) (Pain meds administered)  Progress note created (see row info) Yes  Adult Fall Risk Assessment  Risk Factor Category (scoring not indicated) Fall has occurred during this admission (document High fall risk)  Age 81  Fall History: Fall within 6 months prior to admission 5  Elimination; Bowel and/or Urine Incontinence 2  Elimination; Bowel and/or Urine Urgency/Frequency 2  Medications: includes PCA/Opiates, Anti-convulsants, Anti-hypertensives, Diuretics, Hypnotics, Laxatives, Sedatives, and Psychotropics 5  Patient Care Equipment 2  Mobility-Assistance 2  Mobility-Gait 2  Mobility-Sensory Deficit 2  Altered awareness of immediate physical environment 1  Impulsiveness 2  Lack of understanding of one's physical/cognitive limitations 4  Total Score 32  Patient Fall Risk Level High fall risk  Adult  Fall Risk Interventions  Required Bundle Interventions *See Row Information* High fall risk - low, moderate, and high requirements implemented  Additional Interventions  Camera surveillance (with patient/family notification & education)  Screening for Fall Injury Risk (To be completed on HIGH fall risk patients) - Assessing Need for Floor Mats  Risk For Fall Injury- Criteria for Floor Mats Previous fall this admission  Will Implement Floor Mats Yes  Vitals  Temp 99.1 F (37.3 C)  Temp Source Axillary  BP (!) 130/59  MAP (mmHg) 78  BP Location Left Arm  BP Method Automatic  Patient Position (if appropriate) Lying  Pulse Rate 100  Pulse Rate Source Monitor  Resp 19  Oxygen Therapy  SpO2 95 %  O2 Device Room Air  Pain Assessment  Pain Scale Faces  Faces Pain Scale 10  Pain Type Acute pain  Pain Location Generalized  Neurological  Neuro (WDL) X (No new findings)  Musculoskeletal  Musculoskeletal (WDL) X (no new findings)  Integumentary  Integumentary (WDL) X  Abrasion Location Head (Scant serosanguineous drainage noted)  Abrasion Location Orientation Upper

## 2022-01-19 NOTE — TOC Transition Note (Signed)
Transition of Care [facility]) - CM/SW Discharge Note   Patient Details  Name: Dominic Smith MRN: [id number] Date of Birth: 04/11/1941  Transition of Care [facility] CM/SW Contact:  Ella Bodo, RN Phone Number: 01/19/2022, 1:56 PM   Clinical Narrative:    Patient medically stable for discharge to [facility], and bed available today.  Plan dc to CIR when bed ready.     Final next level of care: IP Rehab Facility Barriers to Discharge: Barriers Resolved   Patient Goals and CMS Choice   CMS Medicare.gov Compare Post Acute Care list provided to:: Patient Represenative (must comment) (son)                          Discharge Plan and Services   Discharge Planning Services: CM Consult                                 Social Determinants of Health (SDOH) Interventions Housing Interventions: Intervention Not Indicated   Readmission Risk Interventions     No data to display         Reinaldo Raddle, RN, BSN  Trauma/Neuro ICU Case Manager [phone]

## 2022-01-19 NOTE — Progress Notes (Signed)
Nursing reports 9 loose moderate volume stools today. I will increase free water to 150 cc every 6 hours and give Imodium. Consult dietician. Follow-up am labs are ordered.
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
## 2022-01-20 ENCOUNTER — Inpatient Hospital Stay (HOSPITAL_COMMUNITY): Payer: Medicare Other

## 2022-01-20 DIAGNOSIS — S42031S Displaced fracture of lateral end of right clavicle, sequela: Secondary | ICD-10-CM

## 2022-01-20 DIAGNOSIS — R410 Disorientation, unspecified: Secondary | ICD-10-CM

## 2022-01-20 DIAGNOSIS — N179 Acute kidney failure, unspecified: Secondary | ICD-10-CM

## 2022-01-20 DIAGNOSIS — S069X0S Unspecified intracranial injury without loss of consciousness, sequela: Secondary | ICD-10-CM

## 2022-01-20 DIAGNOSIS — Z515 Encounter for palliative care: Secondary | ICD-10-CM

## 2022-01-20 DIAGNOSIS — S42291S Other displaced fracture of upper end of right humerus, sequela: Secondary | ICD-10-CM

## 2022-01-20 DIAGNOSIS — R531 Weakness: Secondary | ICD-10-CM

## 2022-01-20 DIAGNOSIS — E8770 Fluid overload, unspecified: Secondary | ICD-10-CM

## 2022-01-20 LAB — COMPREHENSIVE METABOLIC PANEL
ALT: 34 U/L (ref 0–44)
AST: 33 U/L (ref 15–41)
Albumin: 1.8 g/dL — ABNORMAL LOW (ref 3.5–5.0)
Alkaline Phosphatase: 196 U/L — ABNORMAL HIGH (ref 38–126)
Anion gap: 11 (ref 5–15)
BUN: 26 mg/dL — ABNORMAL HIGH (ref 8–23)
CO2: 21 mmol/L — ABNORMAL LOW (ref 22–32)
Calcium: 7.8 mg/dL — ABNORMAL LOW (ref 8.9–10.3)
Chloride: 108 mmol/L (ref 98–111)
Creatinine, Ser: 0.96 mg/dL (ref 0.61–1.24)
GFR, Estimated: >60 mL/min (ref >60)
Glucose, Bld: 118 mg/dL — ABNORMAL HIGH (ref 70–99)
Potassium: 3.5 mmol/L (ref 3.5–5.1)
Sodium: 140 mmol/L (ref 135–145)
Total Bilirubin: 0.8 mg/dL (ref 0.3–1.2)
Total Protein: 5 g/dL — ABNORMAL LOW (ref 6.5–8.1)

## 2022-01-20 LAB — CBC WITH DIFFERENTIAL/PLATELET
Abs Immature Granulocytes: 0.24 10*3/uL — ABNORMAL HIGH (ref 0.00–0.07)
Basophils Absolute: 0 10*3/uL (ref 0.0–0.1)
Basophils Relative: 0 %
Eosinophils Absolute: 0.5 10*3/uL (ref 0.0–0.5)
Eosinophils Relative: 5 %
HCT: 24.9 % — ABNORMAL LOW (ref 39.0–52.0)
Hemoglobin: 8.2 g/dL — ABNORMAL LOW (ref 13.0–17.0)
Immature Granulocytes: 2 %
Lymphocytes Relative: 6 %
Lymphs Abs: 0.7 10*3/uL (ref 0.7–4.0)
MCH: 29.6 pg (ref 26.0–34.0)
MCHC: 32.9 g/dL (ref 30.0–36.0)
MCV: 89.9 fL (ref 80.0–100.0)
Monocytes Absolute: 0.6 10*3/uL (ref 0.1–1.0)
Monocytes Relative: 6 %
Neutro Abs: 8.6 10*3/uL — ABNORMAL HIGH (ref 1.7–7.7)
Neutrophils Relative %: 81 %
Platelets: 360 10*3/uL (ref 150–400)
RBC: 2.77 MIL/uL — ABNORMAL LOW (ref 4.22–5.81)
RDW: 14.6 % (ref 11.5–15.5)
WBC: 10.6 10*3/uL — ABNORMAL HIGH (ref 4.0–10.5)
nRBC: 0 % (ref 0.0–0.2)

## 2022-01-20 LAB — GLUCOSE, CAPILLARY
Glucose-Capillary: 104 mg/dL — ABNORMAL HIGH (ref 70–99)
Glucose-Capillary: 110 mg/dL — ABNORMAL HIGH (ref 70–99)
Glucose-Capillary: 113 mg/dL — ABNORMAL HIGH (ref 70–99)
Glucose-Capillary: 113 mg/dL — ABNORMAL HIGH (ref 70–99)
Glucose-Capillary: 117 mg/dL — ABNORMAL HIGH (ref 70–99)
Glucose-Capillary: 120 mg/dL — ABNORMAL HIGH (ref 70–99)

## 2022-01-20 LAB — BRAIN NATRIURETIC PEPTIDE: B Natriuretic Peptide: 189.8 pg/mL — ABNORMAL HIGH (ref 0.0–100.0)

## 2022-01-20 LAB — VANCOMYCIN, PEAK
Vancomycin Pk: 40 ug/mL (ref 30–40)
Vancomycin Pk: 28 ug/mL — ABNORMAL LOW (ref 30–40)

## 2022-01-20 MED ORDER — CALCIUM POLYCARBOPHIL 625 MG PO TABS: 625.0000 mg | ORAL_TABLET | Freq: Two times a day (BID) | ORAL | Status: DC

## 2022-01-20 MED ORDER — ACETAMINOPHEN 325 MG PO TABS
325.0000 mg | ORAL_TABLET | ORAL | Status: DC | PRN
  Administered 2022-01-31 – 2022-02-21 (×7): 650 mg
  Filled 2022-01-20 (×8): qty 2

## 2022-01-20 MED ORDER — QUETIAPINE FUMARATE 25 MG PO TABS
25.0000 mg | ORAL_TABLET | Freq: Every evening | ORAL | Status: DC | PRN
  Administered 2022-01-20 – 2022-01-23 (×4): 25 mg via NASOGASTRIC
  Filled 2022-01-20 (×4): qty 1

## 2022-01-20 MED ORDER — NUTRISOURCE FIBER PO PACK
1.0000 | PACK | Freq: Two times a day (BID) | ORAL | Status: DC
  Administered 2022-01-20 – 2022-02-10 (×43): 1
  Filled 2022-01-20 (×44): qty 1

## 2022-01-20 MED ORDER — POTASSIUM CHLORIDE 20 MEQ PO PACK
40.0000 meq | PACK | Freq: Once | ORAL | Status: AC
  Administered 2022-01-20: 40 meq
  Filled 2022-01-20: qty 2

## 2022-01-20 MED ORDER — SODIUM CHLORIDE 0.9 % IV SOLN
2.0000 g | Freq: Two times a day (BID) | INTRAVENOUS | Status: AC
  Administered 2022-01-20 – 2022-01-27 (×15): 2 g via INTRAVENOUS
  Filled 2022-01-20 (×18): qty 12.5

## 2022-01-20 NOTE — Progress Notes (Signed)
Inpatient Rehabilitation  Patient information reviewed and entered into eRehab system by Zannah Melucci M. Murriel Holwerda, M.A., CCC/SLP, PPS Coordinator.  Information including medical coding, functional ability and quality indicators will be reviewed and updated through discharge.    
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
## 2022-01-20 NOTE — Plan of Care (Signed)
  Problem: RH Swallowing Goal: LTG Patient will consume least restrictive diet using compensatory strategies with assistance (SLP) Description: LTG:  Patient will consume least restrictive diet using compensatory strategies with assistance (SLP) Flowsheets (Taken 01/20/2022 1542) LTG: Pt Patient will consume least restrictive diet using compensatory strategies with assistance of (SLP): Minimal Assistance - Patient > 75% Goal: LTG Patient will participate in dysphagia therapy to increase swallow function with assistance (SLP) Description: LTG:  Patient will participate in dysphagia therapy to increase swallow function with assistance (SLP) Flowsheets (Taken 01/20/2022 1542) LTG: Pt will participate in dysphagia therapy to increase swallow function with assistance of (SLP): Minimal Assistance - Patient > 75% Goal: LTG Pt will demonstrate functional change in swallow as evidenced by bedside/clinical objective assessment (SLP) Description: LTG: Patient will demonstrate functional change in swallow as evidenced by bedside/clinical objective assessment (SLP) Flowsheets (Taken 01/20/2022 1542) LTG: Patient will demonstrate functional change in swallow as evidenced by bedside/clinical objective assessment: Oropharyngeal swallow   Problem: RH Cognition - SLP Goal: RH LTG Patient will demonstrate orientation with cues Description:  LTG:  Patient will demonstrate orientation to person/place/time/situation with cues (SLP)   Flowsheets (Taken 01/20/2022 1542) LTG Patient will demonstrate orientation to:  Place  Time  Situation LTG: Patient will demonstrate orientation using cueing (SLP): Supervision   Problem: RH Comprehension Communication Goal: LTG Patient will comprehend basic/complex auditory (SLP) Description: LTG: Patient will comprehend basic/complex auditory information with cues (SLP). Flowsheets (Taken 01/20/2022 1542) LTG: Patient will comprehend: Basic auditory information LTG: Patient will  comprehend auditory information with cueing (SLP): Supervision   Problem: RH Expression Communication Goal: LTG Patient will verbally express basic/complex needs(SLP) Description: LTG:  Patient will verbally express basic/complex needs, wants or ideas with cues  (SLP) Flowsheets (Taken 01/20/2022 1542) LTG: Patient will verbally express basic/complex needs, wants or ideas (SLP): Supervision   Problem: RH Problem Solving Goal: LTG Patient will demonstrate problem solving for (SLP) Description: LTG:  Patient will demonstrate problem solving for basic/complex daily situations with cues  (SLP) Flowsheets (Taken 01/20/2022 1542) LTG: Patient will demonstrate problem solving for (SLP): Basic daily situations LTG Patient will demonstrate problem solving for: Minimal Assistance - Patient > 75%   Problem: RH Memory Goal: LTG Patient will use memory compensatory aids to (SLP) Description: LTG:  Patient will use memory compensatory aids to recall biographical/new, daily complex information with cues (SLP) Flowsheets (Taken 01/20/2022 1542) LTG: Patient will use memory compensatory aids to (SLP): Moderate Assistance - Patient 50 - 74%   Problem: RH Attention Goal: LTG Patient will demonstrate this level of attention during functional activites (SLP) Description: LTG:  Patient will will demonstrate this level of attention during functional activites (SLP) Flowsheets (Taken 01/20/2022 1542) Patient will demonstrate during cognitive/linguistic activities the attention type of: Sustained LTG: Patient will demonstrate this level of attention during cognitive/linguistic activities with assistance of (SLP): Minimal Assistance - Patient > 75%   Problem: RH Awareness Goal: LTG: Patient will demonstrate awareness during functional activites type of (SLP) Description: LTG: Patient will demonstrate awareness during functional activites type of (SLP) Flowsheets (Taken 01/20/2022 1542) Patient will demonstrate  during cognitive/linguistic activities awareness type of: Emergent LTG: Patient will demonstrate awareness during cognitive/linguistic activities with assistance of (SLP): Moderate Assistance - Patient 50 - 74%
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
## 2022-01-21 LAB — BASIC METABOLIC PANEL
Anion gap: 10 (ref 5–15)
BUN: 25 mg/dL — ABNORMAL HIGH (ref 8–23)
CO2: 25 mmol/L (ref 22–32)
Calcium: 8.1 mg/dL — ABNORMAL LOW (ref 8.9–10.3)
Chloride: 106 mmol/L (ref 98–111)
Creatinine, Ser: 0.83 mg/dL (ref 0.61–1.24)
GFR, Estimated: >60 mL/min (ref >60)
Glucose, Bld: 120 mg/dL — ABNORMAL HIGH (ref 70–99)
Potassium: 4 mmol/L (ref 3.5–5.1)
Sodium: 141 mmol/L (ref 135–145)

## 2022-01-21 LAB — CBC
HCT: 26.2 % — ABNORMAL LOW (ref 39.0–52.0)
Hemoglobin: 8.7 g/dL — ABNORMAL LOW (ref 13.0–17.0)
MCH: 30.2 pg (ref 26.0–34.0)
MCHC: 33.2 g/dL (ref 30.0–36.0)
MCV: 91 fL (ref 80.0–100.0)
Platelets: 440 10*3/uL — ABNORMAL HIGH (ref 150–400)
RBC: 2.88 MIL/uL — ABNORMAL LOW (ref 4.22–5.81)
RDW: 14.5 % (ref 11.5–15.5)
WBC: 11.8 10*3/uL — ABNORMAL HIGH (ref 4.0–10.5)
nRBC: 0 % (ref 0.0–0.2)

## 2022-01-21 LAB — GLUCOSE, CAPILLARY
Glucose-Capillary: 112 mg/dL — ABNORMAL HIGH (ref 70–99)
Glucose-Capillary: 114 mg/dL — ABNORMAL HIGH (ref 70–99)
Glucose-Capillary: 116 mg/dL — ABNORMAL HIGH (ref 70–99)

## 2022-01-21 LAB — VANCOMYCIN, TROUGH: Vancomycin Tr: 13 ug/mL — ABNORMAL LOW (ref 15–20)

## 2022-01-21 NOTE — Progress Notes (Signed)
Physical Therapy Session Note  Patient Details  Name: Dominic Smith MRN: [id number] Date of Birth: 07-Feb-1941  Today's Date: 01/21/2022 PT Individual Time: 1130-1200 PT Individual Time Calculation (min): 30 min   Short Term Goals: Week 1:  PT Short Term Goal 1 (Week 1): pt will initiate gait training PT Short Term Goal 2 (Week 1): Pt will participate in 1 outcome measure PT Short Term Goal 3 (Week 1): Pt will sit OOB between therapy sessions.  Skilled Therapeutic Interventions/Progress Updates:     Pt received seated in tilt in space WC at RN station. Asleep but awakens to auditory stimulation. Agreeable to therapy and no complaint of pain. Pt is oriented to self but otherwise disoriented. PT provides reorientation to location and situation multiple times during session but pt unable to recall upon questioning. Pt performs sit to stand with minA/modA and cues for initiation and sequencing. Pt ambulates with WC follow and no AD. Pt ambulates x175' with L upper extremity wrapped around PT's trunk for stability, with PT providing minA overall with facilitation of upright posture, weight shifting, and sequencing for safe transfer to mat table. Following seated rest break, pt ambulates 2nd bout with no upper extremity support, initially requiring minA and progressing to modA toward end of gait due to slight flexion in knees and hips throughout gait cycle. Following, pt left seated in tilt in space WC at RN station with mitts on, waist restraint in place.  Therapy Documentation Precautions:  Precautions Precautions: Fall, Cervical Precaution Booklet Issued: No Precaution Comments: cortrak, bilateral soft mitts Required Braces or Orthoses: Sling, Cervical Brace Cervical Brace: At all times, Hard collar Restrictions Weight Bearing Restrictions: Yes RUE Weight Bearing: Non weight bearing Other Position/Activity Restrictions: in sling, nonoperative mgmt per ortho 9/18    Therapy/Group:  Individual Therapy  Breck Coons, PT, DPT 01/21/2022, 12:44 PM

## 2022-01-21 NOTE — Progress Notes (Signed)
Occupational Therapy TBI Note  Patient Details  Name: Dominic Smith MRN: [id number] Date of Birth: 02-Aug-1940  Today's Date: 01/21/2022 OT Individual Time: 1310-1352 OT Individual Time Calculation (min): 42 min    Short Term Goals: Week 1:  OT Short Term Goal 1 (Week 1): Pt will consistently transfer to toilet with 1 caregiver OT Short Term Goal 2 (Week 1): Pt will complete 1/4 steps of donning shirt OT Short Term Goal 3 (Week 1): Pt will complete oral care with MOD A OT Short Term Goal 4 (Week 1): Pt will sit to stand with MOD A or less  Skilled Therapeutic Interventions/Progress Updates:    Pt  greeted seated in TIS wc with DO finishing up assessment on patient. Pt agreeable to OT treatment session. Pt brought to therapy gym and worked on sit<>stands and dynamic balance with standing reaching and tossing horse shoes. OT placed clothes pin on feeding tube to keep it out of the way while standing. Pt's would attempt to answer questions but then answers would not always make sense. Pt needed max multimodal cues to locate horse shoe peg at midline further out in room. Pt often throwing horse shoes all the way to the Left, but improved once pt locked onto target. Min/mod A for balance overall. LB there-ex with 3 sets of 10 sit<>stands with min/mod A and cues to continue sit<>stands as pt pleasantly confused. Pt returned to room and left seated in TIS wc with waist restrain, tilted, B mits on, call bell in lap, and needs met.   Therapy Documentation Precautions:  Precautions Precautions: Fall, Cervical Precaution Booklet Issued: No Precaution Comments: cortrak, bilateral soft mitts Required Braces or Orthoses: Sling, Cervical Brace Cervical Brace: At all times, Hard collar Restrictions Weight Bearing Restrictions: Yes RUE Weight Bearing: Non weight bearing Other Position/Activity Restrictions: in sling, nonoperative mgmt per ortho 9/18  Pain:  Denies pain Agitated Behavior  Scale: TBI Observation Details Observation Environment: CIR Start of observation period - Date: 01/21/22 Start of observation period - Time: 0310 End of observation period - Date: 01/21/22 End of observation period - Time: 1351 Agitated Behavior Scale (DO NOT LEAVE BLANKS) Short attention span, easy distractibility, inability to concentrate: Present to a moderate degree Impulsive, impatient, low tolerance for pain or frustration: Absent Uncooperative, resistant to care, demanding: Absent Violent and/or threatening violence toward people or property: Absent Explosive and/or unpredictable anger: Absent Rocking, rubbing, moaning, or other self-stimulating behavior: Absent Pulling at tubes, restraints, etc.: Absent Wandering from treatment areas: Absent Restlessness, pacing, excessive movement: Absent Repetitive behaviors, motor, and/or verbal: Absent Rapid, loud, or excessive talking: Absent Sudden changes of mood: Absent Easily initiated or excessive crying and/or laughter: Absent Self-abusiveness, physical and/or verbal: Absent Agitated behavior scale total score: 16    Therapy/Group: Individual Therapy  Valma Cava 01/21/2022, 1:55 PM

## 2022-01-21 NOTE — Progress Notes (Signed)
Occupational Therapy TBI Note  Patient Details  Name: Dominic Smith MRN: [id number] Date of Birth: 06/02/1940  Today's Date: 01/21/2022 OT Individual Time: 0945-1100 OT Individual Time Calculation (min): 75 min    Short Term Goals: Week 1:  OT Short Term Goal 1 (Week 1): Pt will consistently transfer to toilet with 1 caregiver OT Short Term Goal 2 (Week 1): Pt will complete 1/4 steps of donning shirt OT Short Term Goal 3 (Week 1): Pt will complete oral care with MOD A OT Short Term Goal 4 (Week 1): Pt will sit to stand with MOD A or less  Skilled Therapeutic Interventions/Progress Updates:     Pt received in bed with no pain. Intermittent arousal in second half of session requiring change in position for alertness. b ADL: Pt requires total A to don pants at bed level and check brief with no initiation to help roll. RN unhooked lines and leads for improved mobility.  Therapeutic activity All mobility completed via ambulation with LUE around OT shoulder to encourage upright posture as pt cannot plan a stand pivot. Pt orthostatic with thigh high teds needing rest break in tilted position. Pt completes seated bean bag toss selecting correct color bean bag with increased time to process command and throws at target with too much force demoing decreased calibration skill. Pt often distracted by other pt demoing impaired selected attention. Pt attempted to engage in BITS visual scanning but demo distraction by reflection in mirror. Pt attempted to sort tiles by color but only able to complete 2 before becoming tangential and perseverative on only the red color  Pt left at end of session in bed with exit alarm on, call light in reach and all needs met   Therapy Documentation Precautions:  Precautions Precautions: Fall, Cervical Precaution Booklet Issued: No Precaution Comments: cortrak, bilateral soft mitts Required Braces or Orthoses: Sling, Cervical Brace Cervical Brace: At all times,  Hard collar Restrictions Weight Bearing Restrictions: Yes RUE Weight Bearing: Non weight bearing Other Position/Activity Restrictions: in sling, nonoperative mgmt per ortho 9/18  Agitated Behavior Scale: TBI  Observation Details Observation Environment: CIR Start of observation period - Date: 01/21/22 Start of observation period - Time: 0945 End of observation period - Date: 01/21/22 End of observation period - Time: 1100 Agitated Behavior Scale (DO NOT LEAVE BLANKS) Short attention span, easy distractibility, inability to concentrate: Present to a moderate degree Impulsive, impatient, low tolerance for pain or frustration: Absent Uncooperative, resistant to care, demanding: Absent Violent and/or threatening violence toward people or property: Absent Explosive and/or unpredictable anger: Absent Rocking, rubbing, moaning, or other self-stimulating behavior: Absent Pulling at tubes, restraints, etc.: Absent Wandering from treatment areas: Absent Restlessness, pacing, excessive movement: Absent Repetitive behaviors, motor, and/or verbal: Absent Rapid, loud, or excessive talking: Absent Sudden changes of mood: Absent Easily initiated or excessive crying and/or laughter: Absent Self-abusiveness, physical and/or verbal: Absent Agitated behavior scale total score: 16   Therapy/Group: Individual Therapy   M  01/21/2022, 12:10 PM 
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
## 2022-01-21 NOTE — Care Management (Signed)
Inpatient [facility] Individual Statement of Services  Patient Name:  Dominic Smith  Date:  01/21/2022  Welcome to the [facility].  Our goal is to provide you with an individualized program based on your diagnosis and situation, designed to meet your specific needs.  With this comprehensive rehabilitation program, you will be expected to participate in at least 3 hours of rehabilitation therapies Monday-Friday, with modified therapy programming on the weekends.  Your rehabilitation program will include the following services:  Physical Therapy (PT), Occupational Therapy (OT), Speech Therapy (ST), 24 hour per day rehabilitation nursing, Therapeutic Recreaction (TR), Psychology, Neuropsychology, Care Coordinator, Rehabilitation Medicine, [place], and Other  Weekly team conferences will be held on Tuesdays to discuss your progress.  Your Inpatient Rehabilitation Care Coordinator will talk with you frequently to get your input and to update you on team discussions.  Team conferences with you and your family in attendance may also be held.  Expected length of stay: 3-4 weeks  Overall anticipated outcome: Minimal Assistance  Depending on your progress and recovery, your program may change. Your Inpatient Rehabilitation Care Coordinator will coordinate services and will keep you informed of any changes. Your Inpatient Rehabilitation Care Coordinator's name and contact numbers are listed  below.  The following services may also be recommended but are not provided by the [facility] will be made to provide these services after discharge if needed.  Arrangements include referral to agencies that provide these services.  Your insurance has been verified to be:  Medicare A  Your primary  doctor is:  No PCP  Pertinent information will be shared with your doctor and your insurance company.  Inpatient Rehabilitation Care Coordinator:  Cathleen Corti [phone] or ([phone]  Information discussed with and copy given to patient by: Rana Snare, 01/21/2022, 8:43 AM
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
## 2022-01-21 NOTE — Progress Notes (Signed)
Patient ID: Dominic Smith, male   DOB: 01/05/1941, 80 y.o.   MRN: [id number]  SW made efforts to mete with pt to complete assessment. Pt not in room. SW will continue to make efforts to complete, and follow for d/c needs.   59- SW left message for pt son Dominic Smith to introduce self, explain role, discuss discharge process, and explain ELOS.  SW encouraged follow-up if needed, otherwise SW will follow-up after team conference.   Loralee Pacas, MSW, [place] Office: [phone] Cell: [phone] Fax: [phone]

## 2022-01-22 DIAGNOSIS — S069XAS Unspecified intracranial injury with loss of consciousness status unknown, sequela: Secondary | ICD-10-CM

## 2022-01-22 LAB — GLUCOSE, CAPILLARY
Glucose-Capillary: 105 mg/dL — ABNORMAL HIGH (ref 70–99)
Glucose-Capillary: 106 mg/dL — ABNORMAL HIGH (ref 70–99)
Glucose-Capillary: 113 mg/dL — ABNORMAL HIGH (ref 70–99)
Glucose-Capillary: 114 mg/dL — ABNORMAL HIGH (ref 70–99)
Glucose-Capillary: 118 mg/dL — ABNORMAL HIGH (ref 70–99)

## 2022-01-22 NOTE — Progress Notes (Addendum)
PROGRESS NOTE   Subjective/Complaints:  Patient seen in [facility] between therapies, note he is continuing to improve in arrousal however orientation and appropriate responses remain difficult. Patient able to state name but otherwise not appropriately responding to questions; becomes tearful and frustrated during interaction asking "why?" Repeatedly.   PT endorses pt having frequent Bms d/t IV abx.   ROS: Limited due to cognitive/behavioral    Objective:   No results found. Recent Labs    01/20/22 0526 01/21/22 0336  WBC 10.6* 11.8*  HGB 8.2* 8.7*  HCT 24.9* 26.2*  PLT 360 440*    Recent Labs    01/20/22 0526 01/21/22 0336  NA 140 141  K 3.5 4.0  CL 108 106  CO2 21* 25  GLUCOSE 118* 120*  BUN 26* 25*  CREATININE 0.96 0.83  CALCIUM 7.8* 8.1*     Intake/Output Summary (Last 24 hours) at 01/22/2022 2307 Last data filed at 01/22/2022 1551 Gross per 24 hour  Intake 240 ml  Output 1425 ml  Net -1185 ml      Pressure Injury 01/13/22 Coccyx Mid Unstageable - Full thickness tissue loss in which the base of the injury is covered by slough (yellow, tan, gray, green or brown) and/or eschar (tan, brown or black) in the wound bed. skin is broken with redness at the (Active)  01/13/22 1930  Location: Coccyx  Location Orientation: Mid  Staging: Unstageable - Full thickness tissue loss in which the base of the injury is covered by slough (yellow, tan, gray, green or brown) and/or eschar (tan, brown or black) in the wound bed.  Wound Description (Comments): skin is broken with redness at the base  Present on Admission: Yes (present on admission to rehab 9/27)    Physical Exam: Vital Signs Blood pressure (!) 151/68, pulse (!) 106, temperature 98.1 F (36.7 C), temperature source Oral, resp. rate 16, height _0  (1.803 m), weight 66.4 kg, SpO2 96 %.  General: Appears comfortable but is lethargic. +Bilateral mittens HEENT:  Head is normocephalic, atraumatic, PERRLA, EOMI, sclera anicteric, oral mucosa pink and moist, dentition intact, ext ear canals clear, +cortrak Neck: Supple without JVD or lymphadenopathy, +C-collar Heart: Reg rate and rhythm. No murmurs rubs or gallops Chest: Scattered upper airway sound, stable. intermittent cough, without production. On RA.  Abdomen: Soft, non-tender, non-distended, bowel sounds positive. Extremities: No clubbing, cyanosis, 1+ LE edema; +TED hose Psych: Pt's affect is appropriate. Pt is cooperative Skin: wounds at knees, both hands, left elbow, right ankle. Scrotum excoriated. Unstageable wound at coccyx - not examined today. B/l drain sites covered in scabs, no drainage or erythema.   Neuro: Awake, lethargic but oriented to self only. Intermittent intelligible and unintelligible speech.  Musculoskeletal: Moving all 4 limbs in WC. Ambulates Mod A with WC follow; needs extensive cueing to sit after stopping.    Assessment/Plan: 1. Functional deficits which require 3+ hours per day of interdisciplinary therapy in a comprehensive inpatient rehab setting. Physiatrist is providing close team supervision and 24 hour management of active medical problems listed below. Physiatrist and rehab team continue to assess barriers to discharge/monitor patient progress toward functional and medical goals  Care Tool:  Bathing  Body parts bathed by helper: Right arm, Left arm, Chest, Abdomen, Front perineal area, Buttocks, Right upper leg, Left upper leg, Right lower leg, Left lower leg, Face     Bathing assist Assist Level: 2 Helpers     Upper Body Dressing/Undressing Upper body dressing   What is the patient wearing?: Pull over shirt    Upper body assist Assist Level: Dependent - Patient 0%    Lower Body Dressing/Undressing Lower body dressing            Lower body assist Assist for lower body dressing: 2 Helpers     Toileting Toileting    Toileting assist  Assist for toileting: 2 Helpers     Transfers Chair/bed transfer  Transfers assist     Chair/bed transfer assist level: 2 Helpers     Locomotion Ambulation   Ambulation assist   Ambulation activity did not occur: Safety/medical concerns          Walk 10 feet activity   Assist  Walk 10 feet activity did not occur: Safety/medical concerns        Walk 50 feet activity   Assist Walk 50 feet with 2 turns activity did not occur: Safety/medical concerns         Walk 150 feet activity   Assist Walk 150 feet activity did not occur: Safety/medical concerns         Walk 10 feet on uneven surface  activity   Assist Walk 10 feet on uneven surfaces activity did not occur: Safety/medical concerns         Wheelchair     Assist Is the patient using a wheelchair?: Yes Type of Wheelchair: Manual    Wheelchair assist level: Dependent - Patient 0% (TIS)      Wheelchair 50 feet with 2 turns activity    Assist    Wheelchair 50 feet with 2 turns activity did not occur: Safety/medical concerns       Wheelchair 150 feet activity     Assist  Wheelchair 150 feet activity did not occur: Safety/medical concerns       Blood pressure (!) 151/68, pulse (!) 106, temperature 98.1 F (36.7 C), temperature source Oral, resp. rate 16, height _0  (1.803 m), weight 66.4 kg, SpO2 96 %.  Medical Problem List and Plan: 1. 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 Appreciate palliative following.   2.  Antithrombotics: -DVT/anticoagulation:  Pharmaceutical: Lovenox 30 mg BID              -antiplatelet therapy: aspirin 81 mg daily 3. Pain Management: Tylenol per tube scheduled,\; oxycodone, Robaxin prn 4. Mood/Behavior/Sleep: LCSW to evaluate and provide emotional support             -antipsychotic agents: add seroquel 14m qhs prn sleep/agitation             -question underlying cognitive impairment  -sleep chart - slept >7 hours 9/29  -may need stimulant; participating well with therapies, will hold off  for now 9/29           - 10/1 - start ritalin 5 mg BID 0600 and 1200 for arousal  5. Neuropsych/cognition: This patient is not capable of making decisions on his own behalf.  -telesitter, soft waist belt, mittens for safety            - Seroquel 25 mg PRN - has received last 2 nights 6. Skin/Wound Care: routine skin care checks             -monitor scalp lac/skin abrasions             -monitor sacral/coccyx pressure injury 7. Fluids/Electrolytes/Nutrition: Strict Is and Os and follow-up chemistries             -NPO; SLP eval             -Continue tube feeds via Cortrak: - Osmolite 1.5 @ 50 ml/hr (1200 ml/day) - PROSource TF20 60 ml daily - Free water flushes 100 ml q 6 hours -9/28 BUN still elevated--but stable. No addnl fluids given edema - 9/29 - BUN, CR stable/mildly improved.  8: Right distal clavicle and humerus fracture:  -continue sling; follow-up with Dr. Marcelino Scot on Monday -check on WB status RUE 9: Right 1 st rib fracture: pneumothorax resolved; IS/FV 10: C5-C6, C4 and C7 transverse process fractures:  -continue cervical collar; follow-up with Dr Annette Stable 11: Possible right VA and ICA injuries: daily aspirin 13: Urinary retention: has indwelling Foley catheter             -Cardura started 9/25 14: ABLA: hemoglobin improving; follow-up CBC 15: SAH:  completed 7 days of Keppra; follow-up with neurosurgery 16: Volume overload/peripheral edema: received lasix 61m yesterday             -strict Is and Os and daily weights             -follow-up BNP 189  yesterday  -track weights Filed Weights   01/20/22 0423 01/21/22 0429 01/22/22 0521  Weight: 67.3 kg 67 kg 66.4 kg    17: AKI: serum creatinine normal; BUN trending down; follow-up BMP 18: Elevated transaminases: trending downward; follow-up CMP tomorrow 19: Leukocytosis, ?aspiration pneumonia w/x hx of staph and pseudomonas - 9/28 CXR - patchy airspace disease in RUL, left perihilar -vancomycin and cefepime total of 7 more days starting from 01/20/2022. (End 10/5) -continue robitussin             -urine culture on 9/21 negative             -follow-up WBC's sl decreased to 10.6--> 11.8; stable             - IM signed off 9/29  20: SVT/sinus tach intermittently: monitor 21: Mediastinal hematoma: Echo WNL; Hgb stable 22: Ankle edema and ecchymosis: x-rays negative for osseous abnormality           - TEDs 23. Loose stools. No s/s infectious diarrhea. Likely d/t abx and Tfs. Getting lomotil PRN.     LOS: 3 days A FACE TO FACE EVALUATION WAS PERFORMED  MGertie Gowda9/30/2023, 11:07 PM

## 2022-01-22 NOTE — IPOC Note (Signed)
Overall Plan of Care [facility]) Patient Details Name: Dominic Smith MRN: [id number] DOB: Aug 21, 1940  Admitting Diagnosis: TBI (traumatic brain injury) [facility])  Hospital Problems: Principal Problem:   TBI (traumatic brain injury) (HCC) Active Problems:   HCAP (healthcare-associated pneumonia)     Functional Problem List: Nursing Behavior, Nutrition, Bladder, Bowel, Pain, Safety, Edema, Endurance, Sensory, Medication Management, Skin Integrity, Motor  PT Balance, Pain, Behavior, Perception, Safety, Endurance, Sensory, Skin Integrity, Motor, Nutrition  OT Balance, Cognition, Edema, Endurance, Motor, Pain, Perception, Safety, Behavior, Sensory, Vision  SLP Cognition, Safety, Nutrition  TR         Basic ADL's: OT Dressing, Bathing, Grooming, Eating     Advanced  ADL's: OT       Transfers: PT Bed Mobility, Bed to Chair, Customer service manager, Tub/Shower     Locomotion: PT Ambulation, Wheelchair Mobility     Additional Impairments: OT    SLP Swallowing, Communication, Social Cognition comprehension, expression Problem Solving, Social Interaction, Memory, Attention, Awareness  TR      Anticipated Outcomes Item Anticipated Outcome  Self Feeding S  Swallowing  Min A with least restrictive diet   Basic self-care  MIN  Toileting  MIN   Bathroom Transfers MIN  Bowel/Bladder  foley in place; last BM 09/27 was liquid  Transfers  min A with LRAD  Locomotion  Min A with LRAD  Communication  Min A  Cognition  Mod A  Pain  less than 3  Safety/Judgment  will remain fall free while in rehab   Therapy Plan: PT Intensity: Minimum of 1-2 x/day ,45 to 90 minutes PT Frequency: 5 out of 7 days PT Duration Estimated Length of Stay: 3-4 weeks OT Intensity: Minimum of 1-2 x/day, 45 to 90 minutes OT Frequency: 5 out of 7 days OT Duration/Estimated Length of Stay: 3-4 weeks SLP Intensity: Minumum of 1-2 x/day, 30 to 90 minutes SLP Frequency: 3 to 5 out of 7 days SLP  Duration/Estimated Length of Stay: 3-4 weeks   Team Interventions: Nursing Interventions Patient/Family Education, Disease Management/Prevention, Skin Care/Wound Management, Discharge Planning, Bladder Management, Pain Management, Cognitive Remediation/Compensation, Psychosocial Support, Bowel Management, Medication Management, Dysphagia/Aspiration Precaution Training  PT interventions Ambulation/gait training, Cognitive remediation/compensation, Discharge planning, DME/adaptive equipment instruction, Functional mobility training, Pain management, Psychosocial support, Splinting/orthotics, Therapeutic Activities, UE/LE Strength taining/ROM, Visual/perceptual remediation/compensation, Wheelchair propulsion/positioning, UE/LE Coordination activities, Therapeutic Exercise, Stair training, Skin care/wound management, Patient/family education, Neuromuscular re-education, Functional electrical stimulation, Disease management/prevention, Firefighter, Warden/ranger  OT Interventions Warden/ranger, Discharge planning, Pain management, Self Care/advanced ADL retraining, Therapeutic Activities, UE/LE Coordination activities, Visual/perceptual remediation/compensation, Therapeutic Exercise, Skin care/wound managment, Patient/family education, Functional mobility training, Disease mangement/prevention, Cognitive remediation/compensation, Firefighter, Fish farm manager, Neuromuscular re-education, Psychosocial support, Splinting/orthotics, Wheelchair propulsion/positioning, UE/LE Strength taining/ROM  SLP Interventions Cognitive remediation/compensation, Dysphagia/aspiration precaution training, Speech/Language facilitation, Internal/external aids, Cueing hierarchy, Environmental controls, Therapeutic Activities, Functional tasks, Patient/family education  TR Interventions    SW/CM Interventions Discharge Planning, Psychosocial Support, Patient/Family  Education   Barriers to Discharge MD  Medical stability, Home enviroment access/loayout, IV antibiotics, Incontinence, Wound care, Lack of/limited family support, Insurance for SNF coverage, Medication compliance, Behavior, and Nutritional means  Nursing Decreased caregiver support, Home environment access/layout, Incontinence, Wound Care, Lack of/limited family support, Weight bearing restrictions, Medication compliance, Behavior, Nutrition means lives with room mates in 2 level home B/B main level 1 ste 0 rails; alt has flight of ste rail on right  PT Inaccessible home environment, Decreased caregiver support, Home environment access/layout, Nutrition means, Incontinence,  Weight bearing restrictions, Behavior    OT Inaccessible home environment, Incontinence, Decreased caregiver support, Lack of/limited family support, Insurance for SNF coverage    SLP Decreased caregiver support, Nutrition means    SW       Team Discharge Planning: Destination: PT-Home ,OT- Home , SLP-Home Projected Follow-up: PT-Home health PT, OT-  Home health OT, SLP-24 hour supervision/assistance, Home Health SLP Projected Equipment Needs: PT-To be determined, OT- 3 in 1 bedside comode, Tub/shower bench, To be determined, SLP-None recommended by SLP Equipment Details: PT- , OT-  Patient/family involved in discharge planning: PT- Patient unable/family or caregiver not available,  OT-Patient unable/family or caregiver not available, SLP-Patient unable/family or caregive not available  MD ELOS: 10-14 days Medical Rehab Prognosis:  Fair Assessment: The patient has been admitted for CIR therapies with the diagnosis of traumatic brain injury. The team will be addressing functional mobility, strength, stamina, balance, safety, adaptive techniques and equipment, self-care, bowel and bladder mgt, patient and caregiver education, medication administration, nutrition, and speech. Goals have been set at [place]. Anticipated  discharge destination is home.       See Team Conference Notes for weekly updates to the plan of care

## 2022-01-22 NOTE — Progress Notes (Signed)
Speech Language Pathology TBI Note  Patient Details  Name: Dominic Smith MRN: [id number] Date of Birth: Sep 10, 1940  Today's Date: 01/22/2022 SLP Individual Time: 0850-0930 SLP Individual Time Calculation (min): 40 min  Short Term Goals: Week 1: SLP Short Term Goal 1 (Week 1): Patient will consume trials of ice chips and initiate a swallow response in 100% of opportunities with Mod verbal cues to assess readiness for MBS. SLP Short Term Goal 2 (Week 1): Patient will demonstrate sustained attention to a functional task for 2 minutes with Max A multimodal cues for redirection. SLP Short Term Goal 3 (Week 1): Patient will orient to place, time and situation with Max A multimodal cues. SLP Short Term Goal 4 (Week 1): Patient will follow 1-step commands in 25% of opportunities with Max A multimodal cues. SLP Short Term Goal 5 (Week 1): Patient will answer yes/no questions with 25% accuracy with Max A multimodal cues.  Skilled Therapeutic Interventions:  Pt was seen for skilled ST targeting goals for dysphagia and communication.  Pt was sleeping upon therapist's arrival but awakened to voice, repositioning, and cold compress to forehead.  Pt was oriented to self only and was unable to recall any specific orientation information despite therapist's multiple attempts to provide passive orientation.  As pt became progressively more alert throughout session, he was noted to become more tangentially verbose.  His responses to basic, biographical questions were often tangential did not correlate to targeted question.  Following oral care, SLP provided trials of ice chips to continue working towards instrumental swallow assessment.  Pt initially had delayed coughing following trials; however he appeared to establish a more typical motor pattern for accepting and swallowing boluses as trials progressed which lead to fewer clinical s/s of aspiration.  Recommend that pt remain NPO with trials of ice chips with SLP  and frequent oral care to minimize bacterial load.  Pt was left in bed with bed alarm set, posey belt donned, and bilateral mitts donned.  Continue per current plan of care.    Pain Pain Assessment Pain Scale: 0-10 Pain Score: 0-No pain  Agitated Behavior Scale: TBI Observation Details Observation Environment: CIR Start of observation period - Date: 01/22/22 Start of observation period - Time: 0850 End of observation period - Date: 01/22/22 End of observation period - Time: 0923 Agitated Behavior Scale (DO NOT LEAVE BLANKS) Short attention span, easy distractibility, inability to concentrate: Present to a moderate degree Impulsive, impatient, low tolerance for pain or frustration: Absent Uncooperative, resistant to care, demanding: Absent Violent and/or threatening violence toward people or property: Absent Explosive and/or unpredictable anger: Absent Rocking, rubbing, moaning, or other self-stimulating behavior: Absent Pulling at tubes, restraints, etc.: Absent Wandering from treatment areas: Absent Restlessness, pacing, excessive movement: Absent Repetitive behaviors, motor, and/or verbal: Present to a slight degree Rapid, loud, or excessive talking: Absent Sudden changes of mood: Absent Easily initiated or excessive crying and/or laughter: Absent Self-abusiveness, physical and/or verbal: Absent Agitated behavior scale total score: 17  Therapy/Group: Individual Therapy  Earnestine Shipp, Selinda Orion 01/22/2022, 9:23 AM

## 2022-01-22 NOTE — Progress Notes (Addendum)
Physical Therapy TBI Note  Patient Details  Name: Dominic Smith MRN: [id number] Date of Birth: 1941-03-10  Today's Date: 01/22/2022 PT Individual Time: 1301-1345 PT Individual Time Calculation (min): 44 min   Short Term Goals: Week 1:  PT Short Term Goal 1 (Week 1): pt will initiate gait training PT Short Term Goal 2 (Week 1): Pt will participate in 1 outcome measure PT Short Term Goal 3 (Week 1): Pt will sit OOB between therapy sessions. Week 2:     Skilled Therapeutic Interventions/Progress Updates:    Session one: Pt initially sleeping soundly, slow to awaken to voice, light touch.  Restraints removed by therapist, nursing called to disconnect feeding tube but unable to disconnect IV for session.  Pt supine to sit w/max cueing to initiate.  Attempts to return supine mult times and therapist eventually able to redirect. Sit to stand w/max cueing to initiate transition, mult attmepts prior to successful stand due to pt not comprehending goal of standing. Stand pivot transfer to TIS w/max multimodal cueing for sequencing/wt shift/completion of task, heavy tactile cueing to transition from stand to sit via pressue at hip flexors  Transported to hallway.  Gait 24ft w/heavy mod assist w/pt L arm over therapist shoulder and stabilization of L hip w/therapist R hip/cueing at R trunk.  Pt w/buckling of L knee w/fatigue requring additional assist. Max cues to transition stand/turn/sit to wc.    Mittens returned to hands, alarm belt secured and powered on, chair tilted for safety, and pt left at nurses station for improved supervision vs bedside.  Second session: Pt had been returned to bed. NG tube and restraints back on pt.   Incontinent needing extended nursing care/unable to justify skilled PT session w/this pt due to cognition.  Nursing notified.  45 min missed time.    Therapy Documentation Precautions:  Precautions Precautions: Fall, Cervical Precaution Booklet Issued:  No Precaution Comments: cortrak, bilateral soft mitts Required Braces or Orthoses: Sling, Cervical Brace Cervical Brace: At all times, Hard collar Restrictions Weight Bearing Restrictions: Yes RUE Weight Bearing: Non weight bearing Other Position/Activity Restrictions: in sling, nonoperative mgmt per ortho 9/18 Agitated Behavior Scale: TBI Observation Details Observation Environment: cir Start of observation period - Date: 01/22/22 Start of observation period - Time: 1301 End of observation period - Date: 01/22/22 End of observation period - Time: 1345 Agitated Behavior Scale (DO NOT LEAVE BLANKS) Short attention span, easy distractibility, inability to concentrate: Present to an extreme degree Impulsive, impatient, low tolerance for pain or frustration: Absent Uncooperative, resistant to care, demanding: Absent Violent and/or threatening violence toward people or property: Absent Explosive and/or unpredictable anger: Absent Rocking, rubbing, moaning, or other self-stimulating behavior: Absent Pulling at tubes, restraints, etc.: Absent Wandering from treatment areas: Absent Restlessness, pacing, excessive movement: Absent Repetitive behaviors, motor, and/or verbal: Present to a slight degree Rapid, loud, or excessive talking: Absent Sudden changes of mood: Absent Easily initiated or excessive crying and/or laughter: Absent Self-abusiveness, physical and/or verbal: Absent Agitated behavior scale total score: 18   Therapy/Group: Individual Therapy  Jerrilyn Cairo 01/22/2022, 3:16 PM
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
## 2022-01-22 NOTE — Progress Notes (Signed)
Occupational Therapy TBI Note  Patient Details  Name: Dominic Smith MRN: [id number] Date of Birth: [birth year]  Today's Date: 01/22/2022 OT Individual Time: 1740-8144 OT Individual Time Calculation (min): 62 min    Short Term Goals: Week 1:  OT Short Term Goal 1 (Week 1): Pt will consistently transfer to toilet with 1 caregiver OT Short Term Goal 2 (Week 1): Pt will complete 1/4 steps of donning shirt OT Short Term Goal 3 (Week 1): Pt will complete oral care with MOD A OT Short Term Goal 4 (Week 1): Pt will sit to stand with MOD A or less  Skilled Therapeutic Interventions/Progress Updates:    Pt received in bed with no pain reported but Shocked to hear orientation information stating "is this some kind of joke? Where is my son" overall pt much more interactive and assiting with managing limbs into clothing.  ADL: Pt completes ADL at overall MAX-total A +2 used for management of w/c and guarding A Level. Skilled interventions include: increased time to allow pt to imitate to fullest extent. Pt requires redirection at times as pt is internally distracted by thoughts and externally distracted especially with visual stimuli. Pt requires MIN-MOD A for all functional mobility with LUE around OT shoulder to encourage upright posture and balance with up to MOD A for balance especially to back to chair or toilet. Pt able to have small liquid BM on toilet after ambulatory transfer and total A to stand>sit d/t pt distracted by grab bar and pull string.   Pt left at end of session in bed with exit alarm on, call light in reach and all needs met   Therapy Documentation Precautions:  Precautions Precautions: Fall, Cervical Precaution Booklet Issued: No Precaution Comments: cortrak, bilateral soft mitts Required Braces or Orthoses: Sling, Cervical Brace Cervical Brace: At all times, Hard collar Restrictions Weight Bearing Restrictions: Yes RUE Weight Bearing: Non weight bearing Other  Position/Activity Restrictions: in sling, nonoperative mgmt per ortho 9/18 General:    Agitated Behavior Scale: TBI  Observation Details Observation Environment: CIR Start of observation period - Date: 01/22/22 Start of observation period - Time: 66 End of observation period - Date: 01/22/22 End of observation period - Time: 1120 Agitated Behavior Scale (DO NOT LEAVE BLANKS) Short attention span, easy distractibility, inability to concentrate: Present to a moderate degree Impulsive, impatient, low tolerance for pain or frustration: Present to a slight degree Uncooperative, resistant to care, demanding: Absent Violent and/or threatening violence toward people or property: Absent Explosive and/or unpredictable anger: Absent Rocking, rubbing, moaning, or other self-stimulating behavior: Absent Pulling at tubes, restraints, etc.: Present to a slight degree Wandering from treatment areas: Absent Restlessness, pacing, excessive movement: Present to a moderate degree Repetitive behaviors, motor, and/or verbal: Absent Rapid, loud, or excessive talking: Absent Sudden changes of mood: Absent Easily initiated or excessive crying and/or laughter: Absent Self-abusiveness, physical and/or verbal: Absent Agitated behavior scale total score: 20  Therapy/Group: Individual Therapy  [name] 01/22/2022, 12:11 PM

## 2022-01-23 LAB — GLUCOSE, CAPILLARY
Glucose-Capillary: 109 mg/dL — ABNORMAL HIGH (ref 70–99)
Glucose-Capillary: 113 mg/dL — ABNORMAL HIGH (ref 70–99)
Glucose-Capillary: 113 mg/dL — ABNORMAL HIGH (ref 70–99)
Glucose-Capillary: 116 mg/dL — ABNORMAL HIGH (ref 70–99)

## 2022-01-23 MED ORDER — METHYLPHENIDATE HCL 5 MG PO TABS
5.0000 mg | ORAL_TABLET | Freq: Two times a day (BID) | ORAL | Status: DC
  Administered 2022-01-23 – 2022-01-25 (×4): 5 mg
  Filled 2022-01-23 (×4): qty 1

## 2022-01-23 MED ORDER — SODIUM CHLORIDE 0.9 % IV SOLN
INTRAVENOUS | Status: DC | PRN
  Administered 2022-01-24 – 2022-01-25 (×3): 10 mL via INTRAVENOUS

## 2022-01-24 DIAGNOSIS — D62 Acute posthemorrhagic anemia: Secondary | ICD-10-CM

## 2022-01-24 DIAGNOSIS — R1312 Dysphagia, oropharyngeal phase: Secondary | ICD-10-CM

## 2022-01-24 LAB — CBC
HCT: 25.3 % — ABNORMAL LOW (ref 39.0–52.0)
Hemoglobin: 8 g/dL — ABNORMAL LOW (ref 13.0–17.0)
MCH: 28.9 pg (ref 26.0–34.0)
MCHC: 31.6 g/dL (ref 30.0–36.0)
MCV: 91.3 fL (ref 80.0–100.0)
Platelets: 363 10*3/uL (ref 150–400)
RBC: 2.77 MIL/uL — ABNORMAL LOW (ref 4.22–5.81)
RDW: 14 % (ref 11.5–15.5)
WBC: 12 10*3/uL — ABNORMAL HIGH (ref 4.0–10.5)
nRBC: 0 % (ref 0.0–0.2)

## 2022-01-24 LAB — BASIC METABOLIC PANEL
Anion gap: 6 (ref 5–15)
BUN: 23 mg/dL (ref 8–23)
CO2: 23 mmol/L (ref 22–32)
Calcium: 7.9 mg/dL — ABNORMAL LOW (ref 8.9–10.3)
Chloride: 109 mmol/L (ref 98–111)
Creatinine, Ser: 0.84 mg/dL (ref 0.61–1.24)
GFR, Estimated: >60 mL/min (ref >60)
Glucose, Bld: 119 mg/dL — ABNORMAL HIGH (ref 70–99)
Potassium: 4.3 mmol/L (ref 3.5–5.1)
Sodium: 138 mmol/L (ref 135–145)

## 2022-01-24 LAB — GLUCOSE, CAPILLARY
Glucose-Capillary: 122 mg/dL — ABNORMAL HIGH (ref 70–99)
Glucose-Capillary: 110 mg/dL — ABNORMAL HIGH (ref 70–99)
Glucose-Capillary: 86 mg/dL (ref 70–99)
Glucose-Capillary: 105 mg/dL — ABNORMAL HIGH (ref 70–99)
Glucose-Capillary: 114 mg/dL — ABNORMAL HIGH (ref 70–99)

## 2022-01-24 MED ORDER — ORAL CARE MOUTH RINSE
15.0000 mL | OROMUCOSAL | Status: DC
  Administered 2022-01-24 – 2022-03-08 (×136): 15 mL via OROMUCOSAL

## 2022-01-24 MED ORDER — QUETIAPINE FUMARATE 25 MG PO TABS
25.0000 mg | ORAL_TABLET | Freq: Every evening | ORAL | Status: DC | PRN
  Administered 2022-01-27 – 2022-02-07 (×4): 25 mg via NASOGASTRIC
  Filled 2022-01-24 (×8): qty 1

## 2022-01-24 MED ORDER — ORAL CARE MOUTH RINSE: 15.0000 mL | OROMUCOSAL | Status: DC | PRN

## 2022-01-24 MED ORDER — QUETIAPINE FUMARATE 25 MG PO TABS
25.0000 mg | ORAL_TABLET | Freq: Every day | ORAL | Status: DC
  Administered 2022-01-24 – 2022-01-30 (×7): 25 mg via ORAL
  Filled 2022-01-24 (×7): qty 1

## 2022-01-24 NOTE — Progress Notes (Signed)
Orthopedic Tech Progress Note Patient Details:  Dominic Smith [birth year] [id number]  [facility] called requesting a new shoulder sling for patient due to other one being very nasty   Ortho Devices Type of Ortho Device: Sling immobilizer Ortho Device/Splint Interventions: Ordered   Post Interventions Patient Tolerated: Other (comment) Instructions Provided: Other (comment)  [name] 01/24/2022, 1:04 PM

## 2022-01-24 NOTE — Progress Notes (Signed)
Speech Language Pathology TBI Note  Patient Details  Name: Dominic Smith MRN: [id number] Date of Birth: November 02, 1940  Today's Date: 01/24/2022 SLP Individual Time: 8101-7510 SLP Individual Time Calculation (min): 42 min  Short Term Goals: Week 1: SLP Short Term Goal 1 (Week 1): Patient will consume trials of ice chips and initiate a swallow response in 100% of opportunities with Mod verbal cues to assess readiness for MBS. SLP Short Term Goal 2 (Week 1): Patient will demonstrate sustained attention to a functional task for 2 minutes with Max A multimodal cues for redirection. SLP Short Term Goal 3 (Week 1): Patient will orient to place, time and situation with Max A multimodal cues. SLP Short Term Goal 4 (Week 1): Patient will follow 1-step commands in 25% of opportunities with Max A multimodal cues. SLP Short Term Goal 5 (Week 1): Patient will answer yes/no questions with 25% accuracy with Max A multimodal cues.  Skilled Therapeutic Interventions: Skilled treatment session focused on cognitive and dysphagia goals. Upon arrival, patient was awake in bed with an ill fitting C-collar. Nursing made aware and repositioned it appropriately. Today, patient demonstrated increased alertness and responsiveness throughout session with his language presenting most consistent with language of confusion vs an expressive aphasia. Patient perseverative on SLP playing a "trick" on him and appeared paranoid throughout session. SLP provided passive orientation and frequent reassurance that he was safe. With max encouragement, SLP provided oral care via the suction toothbrush. Patient consumed trials of ice chips with intermittent delayed coughing noted but patient did initiate a swallow response in 100% of trials. Recommend patient remain NPO with ongoing trials from SLP. Patient let upright in bed with alarm on and all needs within reach. Continue with current plan of care.      Pain No/Denies Pain   Agitated  Behavior Scale: TBI Observation Details Observation Environment: CIR Start of observation period - Date: 01/24/22 Start of observation period - Time: 0735 End of observation period - Date: 01/24/22 End of observation period - Time: 0817 Agitated Behavior Scale (DO NOT LEAVE BLANKS) Short attention span, easy distractibility, inability to concentrate: Present to a moderate degree Impulsive, impatient, low tolerance for pain or frustration: Absent Uncooperative, resistant to care, demanding: Absent Violent and/or threatening violence toward people or property: Absent Explosive and/or unpredictable anger: Absent Rocking, rubbing, moaning, or other self-stimulating behavior: Absent Pulling at tubes, restraints, etc.: Absent Wandering from treatment areas: Absent Restlessness, pacing, excessive movement: Present to a slight degree Repetitive behaviors, motor, and/or verbal: Present to a slight degree Rapid, loud, or excessive talking: Present to a slight degree Sudden changes of mood: Absent Easily initiated or excessive crying and/or laughter: Absent Self-abusiveness, physical and/or verbal: Absent Agitated behavior scale total score: 19  Therapy/Group: Individual Therapy  Avree Szczygiel 01/24/2022, 12:37 PM

## 2022-01-24 NOTE — Progress Notes (Signed)
Physical Therapy Session Note  Patient Details  Name: Dominic Smith MRN: [id number] Date of Birth: 29-Sep-1940  Today's Date: 01/24/2022 PT Missed Time: 45 Minutes Missed Time Reason: Patient fatigue  Short Term Goals: Week 1:  PT Short Term Goal 1 (Week 1): pt will initiate gait training PT Short Term Goal 2 (Week 1): Pt will participate in 1 outcome measure PT Short Term Goal 3 (Week 1): Pt will sit OOB between therapy sessions.  Skilled Therapeutic Interventions/Progress Updates:     Pt supine in bed upon PT arrival. Pt says that he is not interested in participating or getting out of bed at this time, despite multiple PT attempts and requests to do so. PT will follow up as able.  Therapy Documentation Precautions:  Precautions Precautions: Fall, Cervical Precaution Booklet Issued: No Precaution Comments: cortrak, bilateral soft mitts Required Braces or Orthoses: Sling, Cervical Brace Cervical Brace: At all times, Hard collar Restrictions Weight Bearing Restrictions: Yes RUE Weight Bearing: Non weight bearing Other Position/Activity Restrictions: in sling, nonoperative mgmt per ortho 9/18  Therapy/Group: Individual Therapy  Breck Coons, PT, DPT  01/24/2022, 4:20 PM

## 2022-01-24 NOTE — Progress Notes (Signed)
Physical Therapy Session Note  Patient Details  Name: Ary Lavine MRN: [id number] Date of Birth: 1941/02/26  Today's Date: 01/24/2022 PT Individual Time: 1117-1200 PT Individual Time Calculation (min): 43 min   Short Term Goals: Week 1:  PT Short Term Goal 1 (Week 1): pt will initiate gait training PT Short Term Goal 2 (Week 1): Pt will participate in 1 outcome measure PT Short Term Goal 3 (Week 1): Pt will sit OOB between therapy sessions.  Skilled Therapeutic Interventions/Progress Updates: Pt presents sitting in TIS tilted back.  Pt required max cueing for participation , pt holding L mitted hand near nose.  Pt transfers sit to stand w/ mod A and manual cues to initiate standing.  Pt amb in room x 20-25' including turns w/ PT arm around waist, but pt unwilling to extend L elbow.  Pt requires manual facilitation and visual cues for turn to return to w/c and then to bed.  Pt required Total A for sit to R sidelying and then bringing LE s into bed.  Pt returned to supine w/ waist belt on , bed alarm on and all needs in reach.     Therapy Documentation Precautions:  Precautions Precautions: Fall, Cervical Precaution Booklet Issued: No Precaution Comments: cortrak, bilateral soft mitts Required Braces or Orthoses: Sling, Cervical Brace Cervical Brace: At all times, Hard collar Restrictions Weight Bearing Restrictions: Yes RUE Weight Bearing: Non weight bearing Other Position/Activity Restrictions: in sling, nonoperative mgmt per ortho 9/18 General:   Vital Signs:  Pain:no c/o although groans w/ sit to sidelying     Therapy/Group: Individual Therapy  [name] 01/24/2022, 12:20 PM

## 2022-01-24 NOTE — Plan of Care (Signed)
  Problem: RH BLADDER ELIMINATION Goal: RH STG MANAGE BLADDER WITH ASSISTANCE Description: STG Manage Bladder With mod Assistance Outcome: Not Progressing; foley cath   Problem: RH SAFETY Goal: RH STG ADHERE TO SAFETY PRECAUTIONS W/ASSISTANCE/DEVICE Description: STG Adhere to Safety Precautions With min Assistance/Device. Outcome: Not Progressing; telesitter

## 2022-01-24 NOTE — Discharge Instructions (Addendum)
Inpatient Rehab Discharge Instructions  Dominic Smith Discharge date and time:  03/08/2022  Activities/Precautions/ Functional Status: Activity: no lifting, driving, or strenuous exercise until cleared by MD Diet: dysphagia 3, thin liquids Wound Care: Medihoney and foam border dressing to sacral decubitus ulcer Functional status:  ___ No restrictions     ___ Walk up steps independently __x_ 24/7 supervision/assistance   ___ Walk up steps with assistance ____ Intermittent supervision/assistance  ___ Bathe/dress independently ___ Walk with walker     ___ Bathe/dress with assistance ___ Walk Independently    ___ Shower independently ___ Walk with assistance    _x__ Shower with assistance _x__ No alcohol     ___ Return to work/school ________  Special Instructions: No driving, alcohol consumption or tobacco use.   Maintain Foley catheter; routine care. Maintain NS IVFs at 75cc/hr from 7pm-7am.   My questions have been answered and I understand these instructions. I will adhere to these goals and the provided educational materials after my discharge from the hospital.  Patient/Caregiver Signature _______________________________ Date __________  Clinician Signature _______________________________________ Date __________  Please bring this form and your medication list with you to all your follow-up doctor's appointments.  Inpatient Rehab Discharge Instructions

## 2022-01-24 NOTE — Progress Notes (Signed)
PROGRESS NOTE   Subjective/Complaints:  Pt alert in bed. I asked him how he was doing and he responded "ok, how are you?"  ROS: Limited due to cognitive/behavioral    Objective:   No results found.  Recent Labs    01/24/22 0638  WBC 12.0*  HGB 8.0*  HCT 25.3*  PLT 363   Recent Labs    01/24/22 0638  NA 138  K 4.3  CL 109  CO2 23  GLUCOSE 119*  BUN 23  CREATININE 0.84  CALCIUM 7.9*    Intake/Output Summary (Last 24 hours) at 01/24/2022 0856 Last data filed at 01/24/2022 6837 Gross per 24 hour  Intake 1258.31 ml  Output 2950 ml  Net -1691.69 ml     Pressure Injury 01/13/22 Coccyx Mid Unstageable - Full thickness tissue loss in which the base of the injury is covered by slough (yellow, tan, gray, green or brown) and/or eschar (tan, brown or black) in the wound bed. skin is broken with redness at the (Active)  01/13/22 1930  Location: Coccyx  Location Orientation: Mid  Staging: Unstageable - Full thickness tissue loss in which the base of the injury is covered by slough (yellow, tan, gray, green or brown) and/or eschar (tan, brown or black) in the wound bed.  Wound Description (Comments): skin is broken with redness at the base  Present on Admission: Yes (present on admission to rehab 9/27)    Physical Exam: Vital Signs Blood pressure (!) 147/70, pulse 98, temperature 98.7 F (37.1 C), temperature source Axillary, resp. rate 16, height _0  (1.803 m), weight 66.1 kg, SpO2 98 %.  Constitutional: No distress . Vital signs reviewed. HEENT: NCAT, EOMI, oral membranes moist Neck: supple Cardiovascular: RRR without murmur. No JVD    Respiratory/Chest: a few upper airway sounds.     GI/Abdomen: BS +, non-tender, non-distended Ext: no clubbing, cyanosis, or edema Psych: pleasantly confused this morning  Skin: wounds at knees, both hands, left elbow, right ankle. Scrotum excoriated. Sacral wound present. B/l  drain sites covered in scabs, no drainage or erythema.  Neuro: Awake,  oriented to self only. Intermittent intelligible and unintelligible speech.  Moves all 4 limbs Musculoskeletal: no focal jt pain, nl prom.    Assessment/Plan: 1. Functional deficits which require 3+ hours per day of interdisciplinary therapy in a comprehensive inpatient rehab setting. Physiatrist is providing close team supervision and 24 hour management of active medical problems listed below. Physiatrist and rehab team continue to assess barriers to discharge/monitor patient progress toward functional and medical goals  Care Tool:  Bathing        Body parts bathed by helper: Right arm, Left arm, Chest, Abdomen, Front perineal area, Buttocks, Right upper leg, Left upper leg, Right lower leg, Left lower leg, Face     Bathing assist Assist Level: 2 Helpers     Upper Body Dressing/Undressing Upper body dressing   What is the patient wearing?: Pull over shirt    Upper body assist Assist Level: Dependent - Patient 0%    Lower Body Dressing/Undressing Lower body dressing            Lower body assist Assist for lower body  dressing: 2 Hydrographic surveyor Assist for toileting: 2 Helpers     Transfers Chair/bed transfer  Transfers assist     Chair/bed transfer assist level: 2 Helpers     Locomotion Ambulation   Ambulation assist   Ambulation activity did not occur: Safety/medical concerns          Walk 10 feet activity   Assist  Walk 10 feet activity did not occur: Safety/medical concerns        Walk 50 feet activity   Assist Walk 50 feet with 2 turns activity did not occur: Safety/medical concerns         Walk 150 feet activity   Assist Walk 150 feet activity did not occur: Safety/medical concerns         Walk 10 feet on uneven surface  activity   Assist Walk 10 feet on uneven surfaces activity did not occur: Safety/medical  concerns         Wheelchair     Assist Is the patient using a wheelchair?: Yes Type of Wheelchair: Manual    Wheelchair assist level: Dependent - Patient 0% (TIS)      Wheelchair 50 feet with 2 turns activity    Assist    Wheelchair 50 feet with 2 turns activity did not occur: Safety/medical concerns       Wheelchair 150 feet activity     Assist  Wheelchair 150 feet activity did not occur: Safety/medical concerns       Blood pressure (!) 147/70, pulse 98, temperature 98.7 F (37.1 C), temperature source Axillary, resp. rate 16, height _0  (1.803 m), weight 66.1 kg, SpO2 98 %.  Medical Problem List and Plan: 1. 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 Appreciate palliative following.   2.  Antithrombotics: -DVT/anticoagulation:  Pharmaceutical: Lovenox 30 mg BID             -antiplatelet therapy: aspirin 81 mg daily 3. Pain Management: Tylenol per tube scheduled,\; oxycodone, Robaxin prn 4. Mood/Behavior/Sleep: LCSW to evaluate and provide emotional support             -antipsychotic agents: add seroquel 58m qhs prn sleep/agitation             -question underlying cognitive impairment  -sleep chart - slept >7 hours 9/29  -may need stimulant; participating well with therapies, will hold off for now 9/29           - 10/1 - started ritalin 5 mg BID 0600 and 1200 for arousal with benefit  5. Neuropsych/cognition: This patient is not capable of making decisions on his own behalf.  -telesitter, soft waist belt, mittens for safety            - Seroquel 25 mg PRN - has received last 3 nights---will schedule it at 2100   -keep prn as back up 6.  Skin/Wound Care: routine skin care checks             -monitor scalp lac/skin abrasions             -  monitor sacral/coccyx pressure injury 7. Fluids/Electrolytes/Nutrition: Strict Is and Os and follow-up chemistries             -NPO; SLP eval             -Continue tube feeds via Cortrak: - Osmolite 1.5 @ 50 ml/hr (1200 ml/day) - PROSource TF20 60 ml daily - Free water flushes 100 ml q 6 hours -9/28 BUN still elevated--but stable. No addnl fluids given edema - 10/2 - BUN, CR remains stable/mildly improved.  8: Right distal clavicle and humerus fracture:  -continue sling; follow-up with Dr. Marcelino Scot on Monday -NWB RUE, pendulum activiites  9: Right 1 st rib fracture: pneumothorax resolved; IS/FV 10: C5-C6, C4 and C7 transverse process fractures:  -continue cervical collar; follow-up with Dr Annette Stable 11: Possible right VA and ICA injuries: daily aspirin 13: Urinary retention: has indwelling Foley catheter             -Cardura started 9/25 14: ABLA: hemoglobin improving; follow-up CBC 15: SAH:  completed 7 days of Keppra; follow-up with neurosurgery 16: Volume overload/peripheral edema: received lasix 22m x 1 last week             -strict Is and Os and daily weights             -weights are stable Filed Weights   01/22/22 0521 01/23/22 0511 01/24/22 0422  Weight: 66.4 kg 65.6 kg 66.1 kg    17: AKI: serum creatinine normal; BUN trending down; follow-up BMP 18: Elevated transaminases: trending downward; follow-up CMP tomorrow 19: Leukocytosis, ?aspiration pneumonia w/x hx of staph and pseudomonas - 9/28 CXR - patchy airspace disease in RUL, left perihilar -vancomycin and cefepime total of 7 more days starting from 01/20/2022. (End 10/5) -continue robitussin             -urine culture on 9/21 negative             -wbcs 12k 10/2             - IM signed off 9/29  -remains on MRSA precautions for trach aspirate 20: SVT/sinus tach intermittently: monitor 21: Mediastinal hematoma: Echo WNL; Hgb  stable 22: Ankle edema and ecchymosis: x-rays negative for osseous abnormality           - TEDs 23. Loose stools. No s/s infectious diarrhea. Likely d/t abx and Tfs. Getting lomotil PRN.    -showing some improvement in form  LOS: 5 days A FACE TO FACE EVALUATION WAS PERFORMED  ZMeredith Staggers10/05/2021, 8:56 AM

## 2022-01-24 NOTE — Progress Notes (Signed)
Occupational Therapy TBI Note  Patient Details  Name: Dominic Smith MRN: [id number] Date of Birth: 1940-05-20  Today's Date: 01/24/2022 OT Individual Time: 7209-4709 OT Individual Time Calculation (min): 70 min    Short Term Goals: Week 1:  OT Short Term Goal 1 (Week 1): Pt will consistently transfer to toilet with 1 caregiver OT Short Term Goal 2 (Week 1): Pt will complete 1/4 steps of donning shirt OT Short Term Goal 3 (Week 1): Pt will complete oral care with MOD A OT Short Term Goal 4 (Week 1): Pt will sit to stand with MOD A or less  Skilled Therapeutic Interventions/Progress Updates:    Patient agreeable to participate in OT session. Reports no pain when asked. Facial grimacing noted when RUE was gently moved to complete UB dressing and making sling adjustments.     Patient participated in skilled OT session focusing on ADL re-training and functional transfers.  Therapist facilitated session while encouraging pt to participate in dressing and focused on cognitive deficits related to orientation and following 1 step directions in order to promote increased participation in self care while requiring less physical assistance and demonstrate improved safety awareness. LB dressing completed primarily at bed level including donning TEDs, non-slip socks, and scrub pants. Incontinence brief clean and did not need changing. Pt was able to follow one step commands ~50% of the time. When additional cueing was necessary due to decreased follow-through, pt was able to complete request more often than not. New hospital gown was donned while seated on EOB. Pt required total assist including sling management. (New sling requested from Ortho Tech as current one is soiled) Pt stood at EOB with Mod A and helped pull pants up over hips on left side while receiving assist with right side (due to RUE in sling). Son arrived towards end of session and observed pt's transfer from bed to [facility] chair. Pt completed  functional transfer from bed to chair with mod A. Cervical collar was adjusted due to poor fitting and lack of support needed. Education provided to Son regarding proper fit of cervical collar and proper UE in sling. Requested that he let staff know if he thinks either items need adjusting if they are not providing the proper support required. Soft waist restraint placed on patient while seated in TIS chair. Bilateral soft mitts replaced. Requested personal clothing items to be brought for patient to use in rehab when Son is able.     Therapy Documentation Precautions:  Precautions Precautions: Fall, Cervical Precaution Booklet Issued: No Precaution Comments: cortrak, bilateral soft mitts Required Braces or Orthoses: Sling, Cervical Brace Cervical Brace: At all times, Hard collar Restrictions Weight Bearing Restrictions: Yes RUE Weight Bearing: Non weight bearing (pendulums ok) Other Position/Activity Restrictions: in sling, nonoperative mgmt per ortho 9/18    Agitated Behavior Scale: TBI Observation Details Observation Environment: CIR/ patient room Start of observation period - Date: 01/24/22 Start of observation period - Time: 0915 End of observation period - Date: 01/24/22 End of observation period - Time: 1025 Agitated Behavior Scale (DO NOT LEAVE BLANKS) Short attention span, easy distractibility, inability to concentrate: Present to a moderate degree Impulsive, impatient, low tolerance for pain or frustration: Absent Uncooperative, resistant to care, demanding: Absent Violent and/or threatening violence toward people or property: Absent Explosive and/or unpredictable anger: Absent Rocking, rubbing, moaning, or other self-stimulating behavior: Present to a slight degree Pulling at tubes, restraints, etc.: Absent Wandering from treatment areas: Absent Restlessness, pacing, excessive movement: Present to a slight degree  Repetitive behaviors, motor, and/or verbal: Present to a  slight degree Rapid, loud, or excessive talking: Absent Sudden changes of mood: Absent Easily initiated or excessive crying and/or laughter: Absent Self-abusiveness, physical and/or verbal: Absent Agitated behavior scale total score: 19     Therapy/Group: Individual Therapy  Limmie Patricia, OTR/L,CBIS  Supplemental OT - [facility] and [facility]  01/24/2022, 12:23 PM
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
## 2022-01-25 DIAGNOSIS — S069X1S Unspecified intracranial injury with loss of consciousness of 30 minutes or less, sequela: Secondary | ICD-10-CM

## 2022-01-25 LAB — GLUCOSE, CAPILLARY
Glucose-Capillary: 104 mg/dL — ABNORMAL HIGH (ref 70–99)
Glucose-Capillary: 106 mg/dL — ABNORMAL HIGH (ref 70–99)
Glucose-Capillary: 112 mg/dL — ABNORMAL HIGH (ref 70–99)
Glucose-Capillary: 116 mg/dL — ABNORMAL HIGH (ref 70–99)
Glucose-Capillary: 118 mg/dL — ABNORMAL HIGH (ref 70–99)
Glucose-Capillary: 87 mg/dL (ref 70–99)
Glucose-Capillary: 94 mg/dL (ref 70–99)

## 2022-01-25 MED ORDER — METHYLPHENIDATE HCL 5 MG PO TABS
10.0000 mg | ORAL_TABLET | Freq: Two times a day (BID) | ORAL | Status: DC
  Administered 2022-01-25 – 2022-02-09 (×30): 10 mg
  Filled 2022-01-25 (×31): qty 2

## 2022-01-25 MED ORDER — DOXAZOSIN MESYLATE 2 MG PO TABS: 2.0000 mg | ORAL_TABLET | Freq: Every day | ORAL | Status: DC

## 2022-01-25 NOTE — Progress Notes (Signed)
Occupational Therapy TBI Note  Patient Details  Name: Dominic Smith MRN: [id number] Date of Birth: 25-Jun-1940  Today's Date: 01/25/2022 OT Individual Time: 1130-1200 OT Individual Time Calculation (min): 30 min    Today's Date: 01/25/2022 OT Individual Time: 1130-1200 OT Individual Time Calculation (min): 30 min  Short Term Goals: Week 1:  OT Short Term Goal 1 (Week 1): Pt will consistently transfer to toilet with 1 caregiver OT Short Term Goal 2 (Week 1): Pt will complete 1/4 steps of donning shirt OT Short Term Goal 3 (Week 1): Pt will complete oral care with MOD A OT Short Term Goal 4 (Week 1): Pt will sit to stand with MOD A or less  Skilled Therapeutic Interventions/Progress Updates:    Session 1: Pt received in bed with NT, RN and RN student present. Pt is total A for all components of BADL d/t poor attention and arousal. Pt was able to engage in initiation of oral care after OT completes partially for pt, but unable ot continue task d/t poor focused attention. Pt requires multiple trials for sit to stand at EOB eventually with total A to rise for student to pull pants past hips. Then MIN A to walk to TIS ~4 feet. Pt stating, "I dont want to do nothing now." Took to rehab gym and attempted to engage in picking up a magnet and placing it on board but pt unable to do so not even scanning. Placed hand on top of magnet and pt keeps fingers tented over magnet stating, "well I said I didn't want to do nothing." Exited session with pt seated in TIS at RN station, mitts and waiste restraint donned    Session 2: Pt received in TIS with PT present. BP soft with thigh high teds on after walking. Pt with eyes closed.   Therapeutic activity Orthostatic vitals 97/54 at beginning of session 100/65 tilted back 83/43 tilted forward upright--after attempted to engage pt in graded pipe tree activity but pt consistently closing eyes 106/55 fully tilted back Messaged MD and RN about trialing  abdominal binder as pt already has thigh high teds on.   Pt minimally participatory d/t variable BP and fatigue from PT session.   Pt left at end of session in TIS with waist restraint and mitts on seated at RN station   Therapy Documentation Precautions:  Precautions Precautions: Fall, Cervical Precaution Booklet Issued: No Precaution Comments: cortrak, bilateral soft mitts Required Braces or Orthoses: Sling, Cervical Brace Cervical Brace: At all times, Hard collar Restrictions Weight Bearing Restrictions: Yes RUE Weight Bearing: Non weight bearing Other Position/Activity Restrictions: in sling, nonoperative mgmt per ortho 9/18 General:    Agitated Behavior Scale: TBI  Observation Details Observation Environment: CIR Start of observation period - Date: 01/25/22 Start of observation period - Time: 0800 End of observation period - Date: 01/25/22 End of observation period - Time: 0900 Agitated Behavior Scale (DO NOT LEAVE BLANKS) Short attention span, easy distractibility, inability to concentrate: Present to a moderate degree Impulsive, impatient, low tolerance for pain or frustration: Absent Uncooperative, resistant to care, demanding: Absent Violent and/or threatening violence toward people or property: Absent Explosive and/or unpredictable anger: Absent Rocking, rubbing, moaning, or other self-stimulating behavior: Present to a slight degree Pulling at tubes, restraints, etc.: Present to a slight degree Wandering from treatment areas: Absent Restlessness, pacing, excessive movement: Present to a slight degree Repetitive behaviors, motor, and/or verbal: Absent Rapid, loud, or excessive talking: Absent Sudden changes of mood: Absent Easily initiated or excessive crying  and/or laughter: Absent Self-abusiveness, physical and/or verbal: Absent Agitated behavior scale total score: 19   Therapy/Group: Individual Therapy  [name] 01/25/2022, 12:13 PM
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
## 2022-01-25 NOTE — Progress Notes (Addendum)
RN went back to room to assess patient. He is more alert and awake and responded to RN's question.V/S 119/63 HR 80
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
## 2022-01-25 NOTE — Progress Notes (Signed)
Abdominal binder ordered for low bp when patient up with therapy

## 2022-01-25 NOTE — Plan of Care (Addendum)
Behavioral Plan   Rancho Level: [place] 5  Behavior to decrease/ eliminate: -impulsivity -Confusion -Getting OOB -Arousal -PO intake  Changes to environment:  -Lights on, blinds open during the day; off and closed at night -Telesitter -bed alarm on mod setting and 3 rails up with bed against 1 wall, floor mat for patient safety -move patient closer to nurses station  Interventions: -May sit up at the RN station with secretary present in [place]   Recommendations for interactions with patient: -Gently reorient patient -Offer to call family if pt confused -May need increased time to respond or redirection to functional tasks -offer food and/or drink with every interaction  Attendees:  Uvaldo Rising PT Courtney P SLP Jae Dire OT Phylis Bougie RN

## 2022-01-25 NOTE — Progress Notes (Signed)
PROGRESS NOTE   Subjective/Complaints:  OT and nursing in room. Pt slower to arouse this morning. Had a restless night. Cervical collar sliding up face  ROS: Limited due to cognitive/behavioral    Objective:   No results found.  Recent Labs    01/24/22 0638  WBC 12.0*  HGB 8.0*  HCT 25.3*  PLT 363   Recent Labs    01/24/22 0638  NA 138  K 4.3  CL 109  CO2 23  GLUCOSE 119*  BUN 23  CREATININE 0.84  CALCIUM 7.9*    Intake/Output Summary (Last 24 hours) at 01/25/2022 0841 Last data filed at 01/25/2022 0408 Gross per 24 hour  Intake --  Output 1750 ml  Net -1750 ml     Pressure Injury 01/13/22 Coccyx Mid Stage 2 -  Partial thickness loss of dermis presenting as a shallow open injury with a red, pink wound bed without slough. skin is broken with redness at the base (Active)  01/13/22 1930  Location: Coccyx  Location Orientation: Mid  Staging: Stage 2 -  Partial thickness loss of dermis presenting as a shallow open injury with a red, pink wound bed without slough.  Wound Description (Comments): skin is broken with redness at the base  Present on Admission: Yes (present on admission to rehab 9/27)    Physical Exam: Vital Signs Blood pressure (!) 129/93, pulse 100, temperature 98.7 F (37.1 C), temperature source Oral, resp. rate 18, height 5' 11"  (1.803 m), weight 66 kg, SpO2 100 %.  Constitutional: No distress . Vital signs reviewed. HEENT: NCAT, EOMI, oral membranes moist Neck: cervical collar up over chin Cardiovascular: RRR without murmur. No JVD    Respiratory/Chest: CTA Bilaterally without wheezes or rales. Normal effort    GI/Abdomen: BS +, non-tender, non-distended Ext: no clubbing, cyanosis, or edema Psych: pleasant and cooperative  Skin: wounds at knees, both hands, left elbow, right ankle. Scrotum excoriated. Sacral wound present. B/l drain sites covered in scabs, no drainage or erythema. Left  PIV with associated swelling, seems to be functioning. No induration Neuro: Awakens with tactile and verbal cues. Dysarthric speech.  Moves all 4 limbs Musculoskeletal: no focal jt pain, nl prom.    Assessment/Plan: 1. Functional deficits which require 3+ hours per day of interdisciplinary therapy in a comprehensive inpatient rehab setting. Physiatrist is providing close team supervision and 24 hour management of active medical problems listed below. Physiatrist and rehab team continue to assess barriers to discharge/monitor patient progress toward functional and medical goals  Care Tool:  Bathing        Body parts bathed by helper: Right arm, Left arm, Chest, Abdomen, Front perineal area, Buttocks, Right upper leg, Left upper leg, Right lower leg, Left lower leg, Face     Bathing assist Assist Level: 2 Helpers     Upper Body Dressing/Undressing Upper body dressing   What is the patient wearing?: Pull over shirt    Upper body assist Assist Level: Dependent - Patient 0%    Lower Body Dressing/Undressing Lower body dressing            Lower body assist Assist for lower body dressing: 2 Helpers  Toileting Toileting    Toileting assist Assist for toileting: 2 Helpers     Transfers Chair/bed transfer  Transfers assist     Chair/bed transfer assist level: 2 Helpers     Locomotion Ambulation   Ambulation assist   Ambulation activity did not occur: Safety/medical concerns  Assist level: Maximal Assistance - Patient 25 - 49% Assistive device: No Device Max distance: 25   Walk 10 feet activity   Assist  Walk 10 feet activity did not occur: Safety/medical concerns  Assist level: Maximal Assistance - Patient 25 - 49% Assistive device: No Device   Walk 50 feet activity   Assist Walk 50 feet with 2 turns activity did not occur: Safety/medical concerns         Walk 150 feet activity   Assist Walk 150 feet activity did not occur: Safety/medical  concerns         Walk 10 feet on uneven surface  activity   Assist Walk 10 feet on uneven surfaces activity did not occur: Safety/medical concerns         Wheelchair     Assist Is the patient using a wheelchair?: Yes Type of Wheelchair: Manual    Wheelchair assist level: Dependent - Patient 0% (TIS)      Wheelchair 50 feet with 2 turns activity    Assist    Wheelchair 50 feet with 2 turns activity did not occur: Safety/medical concerns       Wheelchair 150 feet activity     Assist  Wheelchair 150 feet activity did not occur: Safety/medical concerns       Blood pressure (!) 129/93, pulse 100, temperature 98.7 F (37.1 C), temperature source Oral, resp. rate 18, height 5' 11"  (1.803 m), weight 66 kg, SpO2 100 %.  Medical Problem List and Plan: 1. 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 Interdisciplinary team conference today to discuss goals, barriers to discharge, and dc planning.              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 Appreciate palliative following.   2.  Antithrombotics: -DVT/anticoagulation:  Pharmaceutical: Lovenox 30 mg BID             -antiplatelet therapy: aspirin 81 mg daily 3. Pain Management: Tylenol per tube scheduled,\; oxycodone, Robaxin prn 4. Mood/Behavior/Sleep: LCSW to evaluate and provide emotional support             -antipsychotic agents: add seroquel 60m qhs prn sleep/agitation             -question underlying cognitive impairment  -sleep chart - slept >7 hours 9/29  - 10/1 - started ritalin 5 mg BID 0600 and 1200 for arousal with benefit  -10/3 increase ritalin to 11m5. Neuropsych/cognition: This patient is not capable of making decisions on  his own behalf.  -telesitter, soft waist belt, mittens for safety            - Seroquel 25 mg scheduled qhs and qhs PRN  -still was a little restless last night -continue sleep chart   6. Skin/Wound Care: routine skin care checks             -  monitor scalp lac/skin abrasions             -monitor sacral/coccyx pressure injury 7. Fluids/Electrolytes/Nutrition: Strict Is and Os and follow-up chemistries             -NPO; SLP eval             -Continue tube feeds via Cortrak: - Osmolite 1.5 @ 50 ml/hr (1200 ml/day) - PROSource TF20 60 ml daily - Free water flushes 100 ml q 6 hours -9/28 BUN still elevated--but stable. No addnl fluids given edema - 10/2 - BUN, CR remains stable/mildly improved. -continue same plan for now 8: Right distal clavicle and humerus fracture:  -continue sling; follow-up with Dr. Marcelino Scot on Monday -NWB RUE, pendulum activiites  9: Right 1 st rib fracture: pneumothorax resolved; IS/FV 10: C5-C6, C4 and C7 transverse process fractures:  -continue cervical collar; follow-up with Dr Annette Stable 11: Possible right VA and ICA injuries: daily aspirin 13: Urinary retention: has indwelling Foley catheter             -Cardura started 9/25 14: ABLA: hemoglobin improving; follow-up CBC 15: SAH:  completed 7 days of Keppra; follow-up with neurosurgery 16: Volume overload/peripheral edema: received lasix 89m x 1 last week             -strict Is and Os and daily weights             -weights are stable Filed Weights   01/23/22 0511 01/24/22 0422 01/25/22 0451  Weight: 65.6 kg 66.1 kg 66 kg    17: AKI: serum creatinine normal; BUN trending down; follow-up BMP 18: Elevated transaminases: trending downward; follow-up CMP tomorrow 19: Leukocytosis, ?aspiration pneumonia w/x hx of staph and pseudomonas - 9/28 CXR - patchy airspace disease in RUL, left perihilar -vancomycin and cefepime total of 7 more days starting from 01/20/2022. (End 10/5)--trying to keep LUE PIV for last 3 days of  abx -continue robitussin             -urine culture on 9/21 negative             -wbcs 12k 10/2             - IM signed off 9/29  -remains on MRSA precautions for trach aspirate 20: SVT/sinus tach intermittently: monitor 21: Mediastinal hematoma: Echo WNL; Hgb stable 22: Ankle edema and ecchymosis: x-rays negative for osseous abnormality           - TEDs 23. Loose stools. No s/s infectious diarrhea. Likely d/t abx and Tfs. Getting lomotil PRN.    -showing some improvement in form  LOS: 6 days A FACE TO FACE EVALUATION WAS PERFORMED  ZMeredith Staggers10/06/2021, 8:41 AM

## 2022-01-25 NOTE — Progress Notes (Addendum)
RN was called to room to assess patient per therapist he was slow to respond ; when asked a question he just stares at the therapist on their session and per therapist  usually he was able to participate. V/S WNL. Patient was placed back to bed and able to open eyes on verbal and tactile stimuli. We will notify PA.New orders noted.

## 2022-01-25 NOTE — Progress Notes (Addendum)
At approximately 1400, I was notified of low BP reading and patient lethargy. Taken by OT. Reviewing VS, his BP at 1100 was 97/56 and I was unaware. At 1401, BP is 117/57. He is awake and alert in bed. He is currently not lethargic, not oriented. Getting TF at 50cc/hr with 150 cc free water every 6 hours. Labs yesterday stable with slight drift down in Hgb to 8.0.  EF is 70-75% with no mitral or aortic valve stenosis on echo 9/22.   Obtain manual pressure and orthostatic VS. He has indwelling Foley catheter and I stopped Cardura that was started on 9/25 on acute side for urinary retention. There was concern for volume overload and treated with several doses of Lasix, none since admission to rehab. Good UOP.

## 2022-01-25 NOTE — Progress Notes (Addendum)
Patient ID: Dominic Smith, male   DOB: [birth year], 81 y.o.   MRN: [id number]  [phone]- SW spoke with pt son Dominic Smith to provide updates from team conference, and d/c date 10/26. SW discussed if pt father has any other supplemental insurance since only Medicare Part A. States he does not believe he has any other insurance. SW encouraged him to begin looking for DME: 3in1 BSC, RW, and wheelchair.SW shared there will be time for family education to see the progression he has made closer towards d/c date. Son shared they are still working on discharge arrangements.   SW left message for pt wife Dominic Smith to provide updates from team conference,and d/c date. SW informed her there will be updates after team conference next week, and encouraged follow-up if questions/concerns.   *SW received a return phone call from pt wife, and SW discussed above. Wife stated he was staying in a rented room before his admission, and he cannot live with her, and unable to move in with his son. Conversation about other options such as  applying for Medicaid, applying for medicare Part B for DME, and SNF options based on reports of him being unable to stay with either of them. D/c care pending.   Loralee Pacas, MSW, [place] Office: [phone] Cell: [phone] Fax: [phone]

## 2022-01-25 NOTE — Progress Notes (Signed)
Speech Language Pathology TBI Note  Patient Details  Name: Dominic Smith MRN: [id number] Date of Birth: Oct 20, 1940  Today's Date: 01/25/2022 SLP Individual Time: 1300-1400 SLP Individual Time Calculation (min): 60 min  Short Term Goals: Week 1: SLP Short Term Goal 1 (Week 1): Patient will consume trials of ice chips and initiate a swallow response in 100% of opportunities with Mod verbal cues to assess readiness for MBS. SLP Short Term Goal 2 (Week 1): Patient will demonstrate sustained attention to a functional task for 2 minutes with Max A multimodal cues for redirection. SLP Short Term Goal 3 (Week 1): Patient will orient to place, time and situation with Max A multimodal cues. SLP Short Term Goal 4 (Week 1): Patient will follow 1-step commands in 25% of opportunities with Max A multimodal cues. SLP Short Term Goal 5 (Week 1): Patient will answer yes/no questions with 25% accuracy with Max A multimodal cues.  Skilled Therapeutic Interventions: Skilled Co-treatment with OT focused on cognitive and dysphagia goals. Upon arrival, patient was awake while upright in the tilt-in-space wheelchair. Patient was alert and demonstrated language of confusion that was essentially unintelligible. Patient required total A to follow commands during oral care via the suction toothbrush with hand over hand needed to initiate wiping his face with his LUE. SLP administered minimal and small trials of ice chips. Patient with minimal oral manipulation without a swallow response observed resulting in delayed coughing. Oral suctioning was also provided. Patient also demonstrated decreased responsiveness to SLP and decreased ability to scan and locate SLP. BP was taken: 95/63. Patient was taken back to the room to transfer to bed. Patient required total +2 assist to initiate standing during transfer. When sitting EOB, patient's BP improved to 110/65. Patient remained minimally responsive but demonstrated appropraite  sitting balance. Eventually, patient began to sway in multiple directions with his BP reading: 97/56. Patient transferred to a supine position with BP taken again: 113/72. Patient remained minimally responsive to multimodal stimuli. Nursing made aware. Patient left reclined in bed with alarm on and all needs within reach. Continue with current plan of care.      Pain No/Denies Pain  Agitated Behavior Scale: TBI Observation Details Observation Environment: CIR Start of observation period - Date: 01/25/22 Start of observation period - Time: 1300 End of observation period - Date: 01/25/22 End of observation period - Time: 1400 Agitated Behavior Scale (DO NOT LEAVE BLANKS) Short attention span, easy distractibility, inability to concentrate: Present to a moderate degree Impulsive, impatient, low tolerance for pain or frustration: Absent Uncooperative, resistant to care, demanding: Absent Violent and/or threatening violence toward people or property: Absent Explosive and/or unpredictable anger: Absent Rocking, rubbing, moaning, or other self-stimulating behavior: Absent Pulling at tubes, restraints, etc.: Absent Wandering from treatment areas: Absent Restlessness, pacing, excessive movement: Absent Repetitive behaviors, motor, and/or verbal: Absent Rapid, loud, or excessive talking: Absent Sudden changes of mood: Absent Easily initiated or excessive crying and/or laughter: Absent Self-abusiveness, physical and/or verbal: Absent Agitated behavior scale total score: 16  Therapy/Group: Individual Therapy  Duff Pozzi 01/25/2022, 3:47 PM

## 2022-01-25 NOTE — Consult Note (Signed)
  Patient was asleep when entered room.  Hard time getting to open eyes, but did eventually.  No more than minimal verbal response.  Patient went back to sleep very quickly.  Informed PA and nursing staff.  Ilean Skill, Psy.D.

## 2022-01-25 NOTE — Plan of Care (Signed)
  Problem: RH BOWEL ELIMINATION Goal: RH STG MANAGE BOWEL WITH ASSISTANCE Description: STG Manage Bowel with min Assistance. Outcome: Not Progressing; incontinence   Problem: RH BLADDER ELIMINATION Goal: RH STG MANAGE BLADDER WITH ASSISTANCE Description: STG Manage Bladder With mod Assistance Outcome: Not Progressing; foley cath   Problem: RH SKIN INTEGRITY Goal: RH STG SKIN FREE OF INFECTION/BREAKDOWN Description: Pressure injury to coccyx will improve and there will be no additional infection/breakdown while in rehab Outcome: Not Progressing; stage 2 buttom   Problem: RH SAFETY Goal: RH STG ADHERE TO SAFETY PRECAUTIONS W/ASSISTANCE/DEVICE Description: STG Adhere to Safety Precautions With min Assistance/Device. Outcome: Not Progressing; telesitter

## 2022-01-25 NOTE — Progress Notes (Addendum)
Orthopedic Tech Progress Note Patient Details:  Dominic Smith 1940-10-14 [id number]  Called in order to [employer] for a smaller COLLAR and dropped off small abdominal binder  Patient ID: Dominic Smith, male   DOB: [birth year], 81 y.o.   MRN: [id number]  [name] 01/25/2022, 9:39 AM
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
## 2022-01-26 DIAGNOSIS — R1319 Other dysphagia: Secondary | ICD-10-CM

## 2022-01-26 DIAGNOSIS — G9340 Encephalopathy, unspecified: Secondary | ICD-10-CM

## 2022-01-26 LAB — GLUCOSE, CAPILLARY
Glucose-Capillary: 100 mg/dL — ABNORMAL HIGH (ref 70–99)
Glucose-Capillary: 109 mg/dL — ABNORMAL HIGH (ref 70–99)
Glucose-Capillary: 117 mg/dL — ABNORMAL HIGH (ref 70–99)
Glucose-Capillary: 85 mg/dL (ref 70–99)
Glucose-Capillary: 87 mg/dL (ref 70–99)
Glucose-Capillary: 92 mg/dL (ref 70–99)

## 2022-01-26 MED ORDER — MEDIHONEY WOUND/BURN DRESSING EX PSTE
1.0000 | PASTE | Freq: Every day | CUTANEOUS | Status: DC
  Administered 2022-01-26 – 2022-03-08 (×39): 1 via TOPICAL
  Filled 2022-01-26: qty 44

## 2022-01-26 MED ORDER — FREE WATER
200.0000 mL | Freq: Four times a day (QID) | Status: DC
  Administered 2022-01-26 – 2022-02-01 (×23): 200 mL

## 2022-01-26 MED ORDER — PROSOURCE TF20 ENFIT COMPATIBL EN LIQD
60.0000 mL | Freq: Two times a day (BID) | ENTERAL | Status: DC
  Administered 2022-01-26 – 2022-01-31 (×11): 60 mL
  Filled 2022-01-26 (×11): qty 60
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
## 2022-01-26 NOTE — Progress Notes (Signed)
Nutrition Follow-up  DOCUMENTATION CODES:   Not applicable  INTERVENTION:   Tube Feeds via Cortrak: Osmolite 1.5 at 60 mL/hr (1200 mL/day) Ok to hold up to 4 hours per day for therapy  Increased ProSource TF20 - BID 150 mL free water flush q4h or per MD Provides 1960 kcal, 115 gm protein, and 1814 mL free water daily.  Continue Nutrisource Fiber per tube  NUTRITION DIAGNOSIS:   Altered GI function related to diarrhea as evidenced by other (comment) (multiple loose BMs per RN). - Progressing   GOAL:   Patient will meet greater than or equal to 90% of their needs - Being addressed via TF  MONITOR:   TF tolerance  REASON FOR ASSESSMENT:   Consult Enteral/tube feeding initiation and management, Other (Comment) (loose stools)  ASSESSMENT:   81 y.o. 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 PMH reviewed.  Discussed with RN. Reports no signs of intolerance to tube feeds. Shares that pt is still confused.    Admission Weight: 69.6 kg Current Weight: 65.5 kg   Medications reviewed and include: Nutrisource Fiber, IV antibiotics  Labs reviewed: 24 hr CBG 87-118  UOP: 2500 mL x 24 hours   Diet Order:   Diet Order             Diet NPO time specified  Diet effective now                   EDUCATION NEEDS:   Not appropriate for education at this time  Skin:  Skin Assessment: Skin Integrity Issues: Skin Integrity Issues:: Unstageable Unstageable: Unstageable wound to coccyx  Last BM:  10/3  Height:   Ht Readings from Last 1 Encounters:  01/19/22 5\' 11"  (1.803 m)    Weight:   Wt Readings from Last 1 Encounters:  01/26/22 65.5 kg    Ideal Body Weight:  78.2 kg  BMI:  Body mass index is 20.14 kg/m.  Estimated Nutritional Needs:   Kcal:  1800-2000  Protein:  90-105 grams  Fluid:  >/= 1.8 L   Hermina Barters RD, LDN Clinical Dietitian See [facility] for contact information.

## 2022-01-26 NOTE — Progress Notes (Signed)
Hypotensive again this morning while up with PT. No change clinically. His mental status is at baseline. Rate and rhythm are regular. No SOB/diaphoresis. Urine remains clear. TED hose and binder in place. Will use ACE wraps. He is on antibiotics through tomorrow. Is and Os last 24 hours = 2500cc UOP and 1800cc TF and FW. Will increase FW to 200 cc q 6 hours.

## 2022-01-26 NOTE — Consult Note (Signed)
[facility] Nurse Consult Note: Reason for Consult: Consult requested for sacrum. Performed remotely after review of progress notes and photos in the EMR.  Pt was noted to have a pressure injury to this location which was present on admission, according to the nursing wound flow sheet, and it has declined to Unstageable.  Pressure Injury POA: Yes Measurement: According to the nurse wound flow sheet; 3.2X2cm, 50% yellow, 50% red Dressing procedure/placement/frequency: Topical treatment orders provided for bedside nurses to perform as follows to assist with removal of nonviable tissue: Apply Medihoney to sacrum Q day, then cover with foam dressing.  (Change foam dressing Q 3 days or PRN soiling.) Please re-consult if further assistance is needed.  Thank-you,  Julien Girt MSN, [place], [place], [place], [place]

## 2022-01-26 NOTE — Progress Notes (Signed)
Occupational Therapy TBI Note  Patient Details  Name: Dominic Smith MRN: [id number] Date of Birth: [birth year]  Today's Date: 01/26/2022 OT Individual Time: 0700-0800 OT Individual Time Calculation (min): 60 min   Today's Date: 01/26/2022 OT Individual Time: 1305-1400 OT Individual Time Calculation (min): 55 min   Short Term Goals: Week 1:  OT Short Term Goal 1 (Week 1): Pt will consistently transfer to toilet with 1 caregiver OT Short Term Goal 2 (Week 1): Pt will complete 1/4 steps of donning shirt OT Short Term Goal 3 (Week 1): Pt will complete oral care with MOD A OT Short Term Goal 4 (Week 1): Pt will sit to stand with MOD A or less  Skilled Therapeutic Interventions/Progress Updates:    Session 1: Pt received in bed asleep but easily aroused. No pain reported. Overall pt remains confused and perseverative, however does not demo OH as was present yesterday with thigh high teds and abdominal binder.  ADL: Pt completes ADL at overall total A Level. Skilled interventions include: increased time to sequence initiate and respond to 1 step commands following 10% of time with occasional resistance of HOH A but then continues washig step once on contact with skin. Pt demo poor praxis during bathing placing wash cloth on head and then leaving there stating, "I am scrubbing." Pt reporting need to toilet and ambulated to bathroom with min-MOD A to stand with LUE around OT shoulder and MAX A for stand>sit. Pt unable to have BM on toilet d/t attention deficits. +2 A to don new brief. Returned to bed in same manner and reapplied mits and waist belt.  Pt left at end of session in bed with exit alarm on, call light in reach and all needs met  Session 2: Pt received in bed with RN present and pt very tangential and verbose this session with minimal difficulty with redirection to functional tasks.  Therapeutic activity Focus of Session on BP management, application of compressive devices, education  with son at end of session on CLOF and prognosis, and functional mobility. Pt able to progress to mobility with 1 HHA with improved upright posture, however during second round pt with stronger L lean requiring increased cuing to correct. Pt still demo scissoring of feet and poor awareness of space around him.   122/85 in bed teds, Ace wraps and abdominal binder 116/80 EOB with above on the rest of vitals 110/65 after functional mobility in hallway 82/55 standing 110/68 seated.be Pt left at end of session in bed with restraints applied with exit alarm on, call light in reach and all needs met   Therapy Documentation Precautions:  Precautions Precautions: Fall, Cervical Precaution Booklet Issued: No Precaution Comments: cortrak, bilateral soft mitts Required Braces or Orthoses: Sling, Cervical Brace Cervical Brace: At all times, Hard collar Restrictions Weight Bearing Restrictions: Yes RUE Weight Bearing: Non weight bearing Other Position/Activity Restrictions: in sling, nonoperative mgmt per ortho 9/18 General:      Agitated Behavior Scale: TBI   Observation Details Observation Environment: CIR Start of observation period - Date: 01/26/22 Start of observation period - Time: 1300 End of observation period - Date: 01/26/22 End of observation period - Time: 1400 Agitated Behavior Scale (DO NOT LEAVE BLANKS) Short attention span, easy distractibility, inability to concentrate: Present to an extreme degree Impulsive, impatient, low tolerance for pain or frustration: Absent Uncooperative, resistant to care, demanding: Absent Violent and/or threatening violence toward people or property: Absent Explosive and/or unpredictable anger: Absent Rocking, rubbing, moaning, or other self-stimulating  behavior: Absent Pulling at tubes, restraints, etc.: Present to a slight degree Wandering from treatment areas: Absent Restlessness, pacing, excessive movement: Present to a slight  degree Repetitive behaviors, motor, and/or verbal: Present to a slight degree Rapid, loud, or excessive talking: Absent Sudden changes of mood: Absent Easily initiated or excessive crying and/or laughter: Absent Self-abusiveness, physical and/or verbal: Absent Agitated behavior scale total score: 20   Therapy/Group: Individual Therapy  [name] 01/26/2022, 6:39 AM

## 2022-01-26 NOTE — Progress Notes (Signed)
Physical Therapy TBI Note  Patient Details  Name: Dominic Smith MRN: [id number] Date of Birth: 1940/05/22  Today's Date: 01/26/2022 PT Individual Time: 0850-1000 PT Individual Time Calculation (min): 70 min   Short Term Goals: Week 1:  PT Short Term Goal 1 (Week 1): pt will initiate gait training PT Short Term Goal 2 (Week 1): Pt will participate in 1 outcome measure PT Short Term Goal 3 (Week 1): Pt will sit OOB between therapy sessions.  Skilled Therapeutic Interventions/Progress Updates:     Patient in bed upon PT arrival. Patient alert and agreeable to PT session. Patient denied pain during session. Patient covered in tube feed due to leak. Focused session on cleaning up and dressing in conjunction with RN providing medications during session.   Patient engaged and conversational >75% of session today with intermittent inappropriate comments, easily redirected. Continues to only be oriented to self at this time, provided orientation during session.   Orthostatic Vitals: Supine: BP 140/75, HR 93  Sitting: BP 133/77, HR 97 Standing: BP 81/60, HR 105  Therapeutic Activity: Bed Mobility: Patient performed supine to/from sit with max A due to poor motor planning and scooting up in bed with total A x2. Provided verbal cues for initiation and sequencing. Transfers: Patient performed stand pivot bed<>TIS w/c with mod-max A and sit to/from stand x2 while holding the sink with his L hand with min A, performed standing balance >1 min each trial with CGA-min A for standing balance with single upper extremity support. Provided verbal cues and facilitation for initiation throughout transfers.  Doffed/donned R arm sling and hospital gown and donned pants with total A. Performed upper body bathing with max A using hand over hand with L hand as able.  Patient required increased time for initiation, cuing, rest breaks, and for completion of tasks throughout session. Utilized therapeutic use of self  throughout to promote efficiency.   Patient in bed due to hypotension with soft waist belt and B mitts secure at end of session with breaks locked, bed alarm set, 4 rails up for safety, and all needs within reach.   Therapy Documentation Precautions:  Precautions Precautions: Fall, Cervical Precaution Booklet Issued: No Precaution Comments: cortrak, bilateral soft mitts Required Braces or Orthoses: Sling, Cervical Brace Cervical Brace: At all times, Hard collar Restrictions Weight Bearing Restrictions: Yes RUE Weight Bearing: Non weight bearing Other Position/Activity Restrictions: in sling, nonoperative mgmt per ortho 9/18 Agitated Behavior Scale: TBI Observation Details Observation Environment: Pt's room Start of observation period - Date: 01/26/22 Start of observation period - Time: 0850 End of observation period - Date: 01/26/22 End of observation period - Time: 1000 Agitated Behavior Scale (DO NOT LEAVE BLANKS) Short attention span, easy distractibility, inability to concentrate: Present to a moderate degree Impulsive, impatient, low tolerance for pain or frustration: Absent Uncooperative, resistant to care, demanding: Absent Violent and/or threatening violence toward people or property: Absent Explosive and/or unpredictable anger: Absent Rocking, rubbing, moaning, or other self-stimulating behavior: Absent Pulling at tubes, restraints, etc.: Absent Wandering from treatment areas: Absent Restlessness, pacing, excessive movement: Absent Repetitive behaviors, motor, and/or verbal: Absent Rapid, loud, or excessive talking: Absent Sudden changes of mood: Absent Easily initiated or excessive crying and/or laughter: Absent Self-abusiveness, physical and/or verbal: Absent Agitated behavior scale total score: 16    Therapy/Group: Individual Therapy  Tyronne Blann L Meira Wahba PT, DPT, NCS, CBIS  01/26/2022, 12:49 PM

## 2022-01-26 NOTE — Progress Notes (Signed)
PROGRESS NOTE   Subjective/Complaints:  Pt with episode of being lethargic, low bp's yesterday afternoon after being a little more alert in the morning. Cardura stopped. Pt appears comfortable in bed this morning. Awake.  ROS: Limited due to cognitive/behavioral    Objective:   No results found.  Recent Labs    01/24/22 0638  WBC 12.0*  HGB 8.0*  HCT 25.3*  PLT 363   Recent Labs    01/24/22 0638  NA 138  K 4.3  CL 109  CO2 23  GLUCOSE 119*  BUN 23  CREATININE 0.84  CALCIUM 7.9*    Intake/Output Summary (Last 24 hours) at 01/26/2022 0902 Last data filed at 01/26/2022 [id number] Gross per 24 hour  Intake --  Output 2000 ml  Net -2000 ml     Pressure Injury 01/13/22 Coccyx Mid Unstageable - Full thickness tissue loss in which the base of the injury is covered by slough (yellow, tan, gray, green or brown) and/or eschar (tan, brown or black) in the wound bed. skin is broken with redness at the (Active)  01/13/22 1930  Location: Coccyx  Location Orientation: Mid  Staging: Unstageable - Full thickness tissue loss in which the base of the injury is covered by slough (yellow, tan, gray, green or brown) and/or eschar (tan, brown or black) in the wound bed.  Wound Description (Comments): skin is broken with redness at the base  Present on Admission: Yes (present on admission to rehab 9/27)    Physical Exam: Vital Signs Blood pressure 137/83, pulse (!) 103, temperature 98.3 F (36.8 C), temperature source Oral, resp. rate 16, height 5\' 11"  (1.803 m), weight 65.5 kg, SpO2 95 %.  Constitutional: No distress . Vital signs reviewed. HEENT: NCAT, EOMI, oral membranes moist Neck: supple Cardiovascular: RRR without murmur. No JVD    Respiratory/Chest: CTA Bilaterally without wheezes or rales. Normal effort    GI/Abdomen: BS +, non-tender, non-distended Ext: no clubbing, cyanosis, or edema Psych: flat, does attempt to  cooperate with exam Skin: wounds at knees, both hands, left elbow, right ankle. Scrotum excoriated. Sacral wound present. B/l drain sites covered in scabs, no drainage or erythema. Left PIV with associated swelling, seems to be functioning. No induration Neuro: Fairly alert. Limited insight and awareness.  Dysarthric speech.  Moves all 4 limbs spontaneously but difficut to grade. Musculoskeletal: no focal jt pain, nl prom.    Assessment/Plan: 1. Functional deficits which require 3+ hours per day of interdisciplinary therapy in a comprehensive inpatient rehab setting. Physiatrist is providing close team supervision and 24 hour management of active medical problems listed below. Physiatrist and rehab team continue to assess barriers to discharge/monitor patient progress toward functional and medical goals  Care Tool:  Bathing        Body parts bathed by helper: Right arm, Left arm, Chest, Abdomen, Front perineal area, Buttocks, Right upper leg, Left upper leg, Right lower leg, Left lower leg, Face     Bathing assist Assist Level: 2 Helpers     Upper Body Dressing/Undressing Upper body dressing   What is the patient wearing?: Pull over shirt    Upper body assist Assist Level: Dependent - Patient 0%  Lower Body Dressing/Undressing Lower body dressing            Lower body assist Assist for lower body dressing: 2 Helpers     Toileting Toileting    Toileting assist Assist for toileting: 2 Helpers     Transfers Chair/bed transfer  Transfers assist     Chair/bed transfer assist level: 2 Helpers     Locomotion Ambulation   Ambulation assist   Ambulation activity did not occur: Safety/medical concerns  Assist level: Maximal Assistance - Patient 25 - 49% Assistive device: No Device Max distance: 25   Walk 10 feet activity   Assist  Walk 10 feet activity did not occur: Safety/medical concerns  Assist level: Maximal Assistance - Patient 25 -  49% Assistive device: No Device   Walk 50 feet activity   Assist Walk 50 feet with 2 turns activity did not occur: Safety/medical concerns         Walk 150 feet activity   Assist Walk 150 feet activity did not occur: Safety/medical concerns         Walk 10 feet on uneven surface  activity   Assist Walk 10 feet on uneven surfaces activity did not occur: Safety/medical concerns         Wheelchair     Assist Is the patient using a wheelchair?: Yes Type of Wheelchair: Manual    Wheelchair assist level: Dependent - Patient 0% (TIS)      Wheelchair 50 feet with 2 turns activity    Assist    Wheelchair 50 feet with 2 turns activity did not occur: Safety/medical concerns       Wheelchair 150 feet activity     Assist  Wheelchair 150 feet activity did not occur: Safety/medical concerns       Blood pressure 137/83, pulse (!) 103, temperature 98.3 F (36.8 C), temperature source Oral, resp. rate 16, height 5\' 11"  (1.803 m), weight 65.5 kg, SpO2 95 %.  Medical Problem List and Plan: 1. Functional deficits secondary to polytrauma, TBI             -patient may not shower             -ELOS/Goals: 14-20d Supervision goals  -Continue CIR therapies including PT, OT, and SLP              - may be dispo issues. Family is working on them   2.  Antithrombotics: -DVT/anticoagulation:  Pharmaceutical: Lovenox 30 mg BID             -antiplatelet therapy: aspirin 81 mg daily 3. Pain Management: Tylenol per tube scheduled,\; oxycodone, Robaxin prn 4. Mood/Behavior/Sleep: LCSW to evaluate and provide emotional support             -antipsychotic agents: add seroquel 25mg  qhs prn sleep/agitation             -question underlying cognitive impairment  -sleep chart - slept >7 hours 9/29  - 10/1 - started ritalin 5 mg BID 0600 and 1200 for arousal with benefit  -10/3 increase ritalin to 10mg  5. Neuropsych/cognition: This patient is not capable of making decisions on  his own behalf.  -telesitter, soft waist belt, mittens for safety            - Seroquel 25 mg scheduled qhs and qhs PRN  -still was a little restless last night -continue sleep chart   6. Skin/Wound Care: routine skin care checks             -  monitor scalp lac/skin abrasions             -appreciate WOC recs for unstageable sacral/coccyx pressure injury   -medihoney/foam dressing 7. Fluids/Electrolytes/Nutrition: Strict Is and Os and follow-up chemistries             -NPO; SLP eval             -Continue tube feeds via Cortrak: - Osmolite 1.5 @ 50 ml/hr (1200 ml/day) - PROSource TF20 60 ml daily - Free water flushes 100 ml q 6 hours -9/28 BUN still elevated--but stable. No addnl fluids given edema - 10/2 - BUN, CR remains stable/mildly improved. -continue same plan for now 8: Right distal clavicle and humerus fracture:  -continue sling; follow-up with Dr. Marcelino Scot on Monday -NWB RUE, pendulum activiites  9: Right 1 st rib fracture: pneumothorax resolved; IS/FV 10: C5-C6, C4 and C7 transverse process fractures:  -continue cervical collar; follow-up with Dr Annette Stable 11: Possible right VA and ICA injuries: daily aspirin 13: Urinary retention: has indwelling Foley catheter             -Cardura started 9/25--stopped d/t hypotension 14: ABLA: hemoglobin improving; follow-up CBC 15: SAH:  completed 7 days of Keppra; follow-up with neurosurgery 16: Volume overload/peripheral edema: received lasix 40mg  x 1 last week             -strict Is and Os and daily weights             -weights are stable Filed Weights   01/24/22 0422 01/25/22 0451 01/26/22 0443  Weight: 66.1 kg 66 kg 65.5 kg    -10/4 bp's low yesterday--cardura stopped   -add more FW to TF   -observe today 17: AKI: serum creatinine normal; BUN trending down; follow-up BMP 18: Elevated transaminases: trending downward; follow-up CMP tomorrow 19: Leukocytosis, ?aspiration pneumonia w/x hx of staph and pseudomonas - 9/28 CXR - patchy  airspace disease in RUL, left perihilar -vancomycin and cefepime total of 7 more days starting from 01/20/2022. (End 10/5)--trying to keep LUE PIV for last 3 days of abx -continue robitussin             -urine culture on 9/21 negative             -wbcs 12k 10/2             - IM signed off 9/29  -remains on MRSA precautions for trach aspirate 20: SVT/sinus tach intermittently: monitor 21: Mediastinal hematoma: Echo WNL; Hgb stable 22: Ankle edema and ecchymosis: x-rays negative for osseous abnormality           Continue TEDS, elevation in bed 23. Loose stools. No s/s infectious diarrhea. Likely d/t abx and Tfs. Getting lomotil PRN.    -showing some improvement in form  LOS: 7 days A FACE TO FACE EVALUATION WAS PERFORMED  Meredith Staggers 01/26/2022, 9:02 AM

## 2022-01-27 ENCOUNTER — Other Ambulatory Visit: Payer: Self-pay

## 2022-01-27 DIAGNOSIS — S069XAS Unspecified intracranial injury with loss of consciousness status unknown, sequela: Secondary | ICD-10-CM

## 2022-01-27 DIAGNOSIS — J189 Pneumonia, unspecified organism: Secondary | ICD-10-CM

## 2022-01-27 DIAGNOSIS — R1312 Dysphagia, oropharyngeal phase: Secondary | ICD-10-CM

## 2022-01-27 DIAGNOSIS — D62 Acute posthemorrhagic anemia: Secondary | ICD-10-CM

## 2022-01-27 DIAGNOSIS — E8770 Fluid overload, unspecified: Secondary | ICD-10-CM

## 2022-01-27 LAB — GLUCOSE, CAPILLARY
Glucose-Capillary: 102 mg/dL — ABNORMAL HIGH (ref 70–99)
Glucose-Capillary: 80 mg/dL (ref 70–99)
Glucose-Capillary: 101 mg/dL — ABNORMAL HIGH (ref 70–99)
Glucose-Capillary: 87 mg/dL (ref 70–99)
Glucose-Capillary: 92 mg/dL (ref 70–99)
Glucose-Capillary: 108 mg/dL — ABNORMAL HIGH (ref 70–99)

## 2022-01-27 MED ORDER — CHLORHEXIDINE GLUCONATE CLOTH 2 % EX PADS
6.0000 | MEDICATED_PAD | CUTANEOUS | Status: DC
  Administered 2022-01-27 – 2022-01-28 (×3): 6 via TOPICAL

## 2022-01-27 NOTE — Progress Notes (Signed)
Occupational Therapy TBI Session Note  Patient Details  Name: Dominic Smith MRN: [id number] Date of Birth: 29-May-1940  Today's Date: 01/27/2022 OT Individual Time: 0569-7948 OT Individual Time Calculation (min): 30 min    Short Term Goals: Week 1:  OT Short Term Goal 1 (Week 1): Pt will consistently transfer to toilet with 1 caregiver OT Short Term Goal 1 - Progress (Week 1): Met OT Short Term Goal 2 (Week 1): Pt will complete 1/4 steps of donning shirt OT Short Term Goal 2 - Progress (Week 1): Progressing toward goal OT Short Term Goal 3 (Week 1): Pt will complete oral care with MOD A OT Short Term Goal 3 - Progress (Week 1): Progressing toward goal OT Short Term Goal 4 (Week 1): Pt will sit to stand with MOD A or less OT Short Term Goal 4 - Progress (Week 1): Met  Skilled Therapeutic Interventions/Progress Updates:     Pt received in TIS with no pain.  Therapeutic activity BITS seaed in TIS:  2 min visual scanning with Total cuing and HOH A to intiate for reaching to level 10 target with >15 sec reaction time  Visual scanning 1-5 with total cuing to count with internal and external distractions taking 10 min and 27 second with HOH A 3/5 reaching attempts. Pt often staring into space without purposeful scanning  Pt completes MIN A functional mobility to EOB from TIS with UE over OT to facilitate upright posture. Total A sit>supine.   Pt left at end of session in bed with exit alarm on, call light in reach and all needs met   Therapy Documentation Precautions:  Precautions Precautions: Fall, Cervical Precaution Booklet Issued: No Precaution Comments: cortrak, bilateral soft mitts Required Braces or Orthoses: Sling, Cervical Brace Cervical Brace: At all times, Hard collar Restrictions Weight Bearing Restrictions: Yes RUE Weight Bearing: Non weight bearing Other Position/Activity Restrictions: in sling, nonoperative mgmt per ortho 9/18 General:   Vital Signs:   ABS  discontinued d/t ABS score less than 20 for the last three days or no behaviors present   Therapy/Group: Individual Therapy  [name] 01/27/2022, 12:16 PM

## 2022-01-27 NOTE — Progress Notes (Signed)
Speech Language Pathology Weekly Progress and Session Note  Patient Details  Name: Stephfon Bovey MRN: [id number] Date of Birth: December 17, 1940  Beginning of progress report period: January 20, 2022 End of progress report period: January 27, 2022  Today's Date: 01/27/2022 SLP Individual Time: 0830-0910 SLP Individual Time Calculation (min): 40 min  Short Term Goals: Week 1: SLP Short Term Goal 1 (Week 1): Patient will consume trials of ice chips and initiate a swallow response in 100% of opportunities with Mod verbal cues to assess readiness for MBS. SLP Short Term Goal 1 - Progress (Week 1): Not met SLP Short Term Goal 2 (Week 1): Patient will demonstrate sustained attention to a functional task for 2 minutes with Max A multimodal cues for redirection. SLP Short Term Goal 2 - Progress (Week 1): Not met SLP Short Term Goal 3 (Week 1): Patient will orient to place, time and situation with Max A multimodal cues. SLP Short Term Goal 3 - Progress (Week 1): Not met SLP Short Term Goal 4 (Week 1): Patient will follow 1-step commands in 25% of opportunities with Max A multimodal cues. SLP Short Term Goal 4 - Progress (Week 1): Not met SLP Short Term Goal 5 (Week 1): Patient will answer yes/no questions with 25% accuracy with Max A multimodal cues. SLP Short Term Goal 5 - Progress (Week 1): Not met    New Short Term Goals: Week 2: SLP Short Term Goal 1 (Week 2): Patient will consume trials of ice chips and initiate a swallow response in 100% of opportunities with Mod verbal cues to assess readiness for MBS. SLP Short Term Goal 2 (Week 2): Patient will demonstrate sustained attention to a functional task for 2 minutes with Max A multimodal cues for redirection. SLP Short Term Goal 3 (Week 2): Patient will orient to place, time and situation with Max A multimodal cues. SLP Short Term Goal 4 (Week 2): Patient will answer yes/no questions with 25% accuracy with Max A multimodal cues. SLP Short Term  Goal 5 (Week 2): Patient will follow 1-step commands in 25% of opportunities with Max A multimodal cues.  Weekly Progress Updates: Patient has made minimal and inconsistent gains and has not met any STGs this reporting period. Currently, patient demonstrates behaviors consistent with a Rancho Level V and requires Max-Total A to complete functional and familiar tasks safely in regards to arousal, initiation, orientation, sustained attention, problem solving, awareness, and recall with use of compensatory strategies. Patient also demonstrates language of confusion with participation limited by fatigue. Patient remains NPO and has been participating in limited trials with SLP. Patient demonstrates impaired and inconsistent oral manipulation and has not been consistent with initiating a swallow responses. Patient will need an MBS to assess swallow function, however, patient has not been appropriate from both a cognitive and swallowing standpoint. Patient and family education ongoing. Patient would benefit from continued skilled SLP intervention to maximize his cognitive and swallowing function prior to discharge.     Intensity: Minumum of 1-2 x/day, 30 to 90 minutes Frequency: 3 to 5 out of 7 days Duration/Length of Stay: 02/17/22 Treatment/Interventions: Cognitive remediation/compensation;Dysphagia/aspiration precaution training;Speech/Language facilitation;Internal/external aids;Cueing hierarchy;Environmental controls;Therapeutic Activities;Functional tasks;Patient/family education   Daily Session  Skilled Therapeutic Interventions:  Skilled treatment session focused on cognitive and dysphagia goals. Upon arrival, patient was awake in bed and appeared to be having a full conversation without anyone else present in the room. SLP provided total A for orientation as patient responded to all orientation questions by repeating the same  question back to the SLP. Patient would not open his oral cavity for oral  care, therefore, SLP provided total A for oral care via the suction toothbrush. Patient consumed trials of small ice chips with minimal oral manipulation noted and delayed, subtle coughing. Patient appeared to initiate a swallow response in 75% of trials. As the session progressed, patient easily fatigued and essentially stopped responding to SLP.  Recommend patient remain NPO at this time. Patient left upright in bed with alarm on and all needs within reach. Continue with current plan of care.    Pain No/Denies Pain   Therapy/Group: Individual Therapy  Juandavid Dallman 01/27/2022, 6:43 AM

## 2022-01-27 NOTE — Progress Notes (Signed)
PROGRESS NOTE   Subjective/Complaints:  Pt up in bed with SLP, nursing in room. Very alert, conversing with staff but confused  ROS: Limited due to cognitive/behavioral    Objective:   No results found.  No results for input(s): "WBC", "HGB", "HCT", "PLT" in the last 72 hours.  No results for input(s): "NA", "K", "CL", "CO2", "GLUCOSE", "BUN", "CREATININE", "CALCIUM" in the last 72 hours.   Intake/Output Summary (Last 24 hours) at 01/27/2022 0845 Last data filed at 01/27/2022 0535 Gross per 24 hour  Intake 2000 ml  Output 2125 ml  Net -125 ml     Pressure Injury 01/13/22 Coccyx Mid Unstageable - Full thickness tissue loss in which the base of the injury is covered by slough (yellow, tan, gray, green or brown) and/or eschar (tan, brown or black) in the wound bed. skin is broken with redness at the (Active)  01/13/22 1930  Location: Coccyx  Location Orientation: Mid  Staging: Unstageable - Full thickness tissue loss in which the base of the injury is covered by slough (yellow, tan, gray, green or brown) and/or eschar (tan, brown or black) in the wound bed.  Wound Description (Comments): skin is broken with redness at the base  Present on Admission: Yes (present on admission to rehab 9/27)    Physical Exam: Vital Signs Blood pressure 135/77, pulse 97, temperature 97.8 F (36.6 C), temperature source Oral, resp. rate 16, height 5\' 11"  (1.803 m), weight 66.2 kg, SpO2 99 %.  Constitutional: No distress . Vital signs reviewed. HEENT: NCAT, EOMI, oral membranes moist Neck: supple Cardiovascular: RRR without murmur. No JVD    Respiratory/Chest: CTA Bilaterally without wheezes or rales. Normal effort    GI/Abdomen: BS +, non-tender, non-distended Ext: no clubbing, cyanosis, or edema Psych: pleasantly confused.   Skin: wounds at knees, both hands, left elbow, right ankle. Scrotum excoriated. Sacral wound present. Foam  dressings at based of collar Left PIV stable. Neuro: very alert. Confused, LOC. Phonation excellent today.   Moves all 4 limbs spontaneously but difficut to grade. Musculoskeletal: no focal jt pain, nl prom.    Assessment/Plan: 1. Functional deficits which require 3+ hours per day of interdisciplinary therapy in a comprehensive inpatient rehab setting. Physiatrist is providing close team supervision and 24 hour management of active medical problems listed below. Physiatrist and rehab team continue to assess barriers to discharge/monitor patient progress toward functional and medical goals  Care Tool:  Bathing        Body parts bathed by helper: Right arm, Left arm, Chest, Abdomen, Front perineal area, Buttocks, Right upper leg, Left upper leg, Right lower leg, Left lower leg, Face     Bathing assist Assist Level: 2 Helpers     Upper Body Dressing/Undressing Upper body dressing   What is the patient wearing?: Pull over shirt    Upper body assist Assist Level: Dependent - Patient 0%    Lower Body Dressing/Undressing Lower body dressing            Lower body assist Assist for lower body dressing: 2 Helpers     Toileting Toileting    Toileting assist Assist for toileting: 2 Helpers     Transfers  Chair/bed transfer  Transfers assist     Chair/bed transfer assist level: 2 Helpers     Locomotion Ambulation   Ambulation assist   Ambulation activity did not occur: Safety/medical concerns  Assist level: Maximal Assistance - Patient 25 - 49% Assistive device: No Device Max distance: 25   Walk 10 feet activity   Assist  Walk 10 feet activity did not occur: Safety/medical concerns  Assist level: Maximal Assistance - Patient 25 - 49% Assistive device: No Device   Walk 50 feet activity   Assist Walk 50 feet with 2 turns activity did not occur: Safety/medical concerns         Walk 150 feet activity   Assist Walk 150 feet activity did not occur:  Safety/medical concerns         Walk 10 feet on uneven surface  activity   Assist Walk 10 feet on uneven surfaces activity did not occur: Safety/medical concerns         Wheelchair     Assist Is the patient using a wheelchair?: Yes Type of Wheelchair: Manual    Wheelchair assist level: Dependent - Patient 0% (TIS)      Wheelchair 50 feet with 2 turns activity    Assist    Wheelchair 50 feet with 2 turns activity did not occur: Safety/medical concerns       Wheelchair 150 feet activity     Assist  Wheelchair 150 feet activity did not occur: Safety/medical concerns       Blood pressure 135/77, pulse 97, temperature 97.8 F (36.6 C), temperature source Oral, resp. rate 16, height 5\' 11"  (1.803 m), weight 66.2 kg, SpO2 99 %.  Medical Problem List and Plan: 1. 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 Family is working on them   2.  Antithrombotics: -DVT/anticoagulation:  Pharmaceutical: Lovenox 30 mg BID             -antiplatelet therapy: aspirin 81 mg daily 3. Pain Management: Tylenol per tube scheduled,\; oxycodone, Robaxin prn 4. Mood/Behavior/Sleep: LCSW to evaluate and provide emotional support             -antipsychotic agents: add seroquel 25mg  qhs prn sleep/agitation             -question underlying cognitive impairment  -sleep chart - slept >7 hours 9/29  - 10/1 - started ritalin 5 mg BID 0600 and 1200 for arousal with benefit  -10/3 increased ritalin to 10mg  with benefit---continue 5. Neuropsych/cognition: This patient is not capable of making decisions on his own behalf.  -telesitter, soft waist belt, mittens for safety            - Seroquel 25 mg scheduled qhs and qhs PRN  -still was a little restless last night -continue sleep chart   6. Skin/Wound Care:  routine skin care checks             -monitor scalp lac/skin abrasions             -appreciate WOC recs for unstageable sacral/coccyx pressure injury   -medihoney/foam dressing 7. Fluids/Electrolytes/Nutrition: Strict Is and Os and follow-up chemistries             -  NPO; SLP eval             -Continue tube feeds via Cortrak: - Osmolite 1.5 @ 50 ml/hr (1200 ml/day) - PROSource TF20 60 ml daily - Free water flushes 200 ml q 6 hours -labs Friday 8: Right distal clavicle and humerus fracture:  -continue sling; follow-up with Dr. Marcelino Scot on Monday -NWB RUE, pendulum activiites  9: Right 1 st rib fracture: pneumothorax resolved; IS/FV 10: C5-C6, C4 and C7 transverse process fractures:  -continue cervical collar; follow-up with Dr Annette Stable 11: Possible right VA and ICA injuries: daily aspirin 13: Urinary retention: has indwelling Foley catheter             -Cardura started 9/25--stopped d/t hypotension 14: ABLA: hemoglobin improving; follow-up CBC 15: SAH:  completed 7 days of Keppra; follow-up with neurosurgery 16: Volume overload/peripheral edema: received lasix 40mg  x 1 last week             -strict Is and Os and daily weights             -weights are stable Filed Weights   01/25/22 0451 01/26/22 0443 01/27/22 0418  Weight: 66 kg 65.5 kg 66.2 kg    -10/5 bp's reported low yesterday--cardura already stopped stopped   -added more FW to TF   -BP's appear more robust overall 17: AKI: serum creatinine normal; BUN trending down; follow-up BMP 18: Elevated transaminases: trending downward; follow-up CMP tomorrow 19: Leukocytosis, ?aspiration pneumonia w/x hx of staph and pseudomonas - 9/28 CXR - patchy airspace disease in RUL, left perihilar -vancomycin and cefepime total of 7 more days starting from 01/20/2022. (End 10/5)-today -continue robitussin             -urine culture on 9/21 negative             -wbcs 12k 10/2             - IM signed off 9/29  -remains on MRSA precautions for trach  aspirate 20: SVT/sinus tach intermittently: monitor 21: Mediastinal hematoma: Echo WNL; Hgb stable 22: Ankle edema and ecchymosis: x-rays negative for osseous abnormality           Continue TEDS, elevation in bed 23. Loose stools. No s/s infectious diarrhea. Likely d/t abx and Tfs. Getting lomotil PRN.    -should improve off abx---still loose 10/4  LOS: 8 days A FACE TO FACE EVALUATION WAS PERFORMED  Meredith Staggers 01/27/2022, 8:45 AM

## 2022-01-27 NOTE — Progress Notes (Addendum)
Physical Therapy Weekly Progress Note  Patient Details  Name: Dominic Smith MRN: [id number] Date of Birth: 28-Nov-1940  Beginning of progress report period: January 20, 2022 End of progress report period: January 27, 2022  Today's Date: 01/27/2022 PT Individual Time: 1000-1100 PT Individual Time Calculation (min): 60 min   Patient has met 3 of 3 short term goals.  Patient with steady progress this week, limited by orthostatic hypotension, improved by end of week with med management and use of B thigh high TEDs, abdominal binder, and B thigh high ACE wraps. Patient performed bed mobility with mod-max A, transfers with mod A with fluctuations between max-min A, and gait >200 feet holding IV pole on L with mod A. Patient has tolerated sitting OOB between therapy sessions, poorly with incidence of hypotension, and well at end of the week. Very limited by deficits in initiation and motor planning as well as attention, recall, and carryover with all mobility.   Patient continues to demonstrate the following deficits muscle weakness and muscle joint tightness, decreased cardiorespiratoy endurance, impaired timing and sequencing, abnormal tone, ataxia, decreased coordination, and decreased motor planning, decreased initiation, decreased attention, decreased awareness, decreased problem solving, decreased safety awareness, decreased memory, delayed processing, and demonstrates behaviors consistent with Rancho Level V-VI, and decreased sitting balance, decreased standing balance, decreased postural control, decreased balance strategies, and difficulty maintaining precautions and therefore will continue to benefit from skilled PT intervention to increase functional independence with mobility.  Patient progressing toward long term goals..  Continue plan of care.  PT Short Term Goals Week 1:  PT Short Term Goal 1 (Week 1): pt will initiate gait training PT Short Term Goal 1 - Progress (Week 1): Met PT Short  Term Goal 2 (Week 1): Pt will participate in 1 outcome measure PT Short Term Goal 2 - Progress (Week 1): Met PT Short Term Goal 3 (Week 1): Pt will sit OOB between therapy sessions. PT Short Term Goal 3 - Progress (Week 1): Met Week 2:  PT Short Term Goal 1 (Week 2): Patient will perform basic transfers with min A >50% of the time using LRAD. PT Short Term Goal 2 (Week 2): Patient will perform bed mobility with min A >50% of the time. PT Short Term Goal 3 (Week 2): Patient will ambulate >200 ft with min A using LRAD. PT Short Term Goal 4 (Week 2): Patient will initiate stair training.  Skilled Therapeutic Interventions/Progress Updates:     Patient in bed upon PT arrival. Patient alert and agreeable to PT session. Patient denied pain during session. Oriented only to his name this morning, provided orientation during session. B thigh high TEDs, abdominal binder, and B ACE wraps to thighs donned prior to and during session. Patient with R arm sling donned and adjusted throughout session.   Orthostatic Vitals: Supine: BP 140/78, HR 94  Sitting: BP 148/69, HR 101 Standing: BP 96/47, HR 105 Standing x3 min: BP 139/64, HR 96 (returned to sitting prior to 3 min) Walking >100 feet: BP 140/69, HR 96 Walking >200 feet: BP 131/77, HR 72  Therapeutic Activity: Bed Mobility: Patient performed supine to sit with min A with HOB fully elevated. Provided verbal cues for initiation and sequencing throughout. Transfers: Patient performed stand pivot bed>w/c with mod A with facilitation for weight shifting and stepping. He performed sit to/from stand x4 with mod-min A pushing up with L hand from arm rest. Provided verbal cues for 1-2-3 count for initiation and forward weight shift. Assessed Merrilee Jansky Balance  Test during session, limited by reduced postural control. Patient demonstrated increased fall risk noted by score of 4/56 on the [facility] Scale.  <45/56 = fall risk, <42/56 = predictive of recurrent falls,  <40/56 = 100% fall risk  >41 = independent, 21-40 = assistive device, 0-20 = wheelchair level  MDC 6.9 (4 pts 45-56, 5 pts 35-44, 7 pts 25-34) (ANPTA Core Set of Outcome Measures for Adults with Neurologic Conditions, 2018)   Gait Training:  Patient ambulated 100 feet with x2 180 degree turns and 200 feet walking around the nurses station and day room from his room using the IV pole for support with max progressing to mod A. Ambulated with decreased gait speed, decreased step length and height, narrow BOS with variable foot placement and intermittent scissoring, decreased R weight shift with L and forward trunk lean, and downward head gaze. Provided verbal cues for erect posture, looking ahead, increased BOS, and facilitation for weight shift.  Patient in [place] w/c handed off to the unit secretary at the nurses station for 1-1 supervision at end of session with breaks locked, soft waist belt secured, seat tilted back, and all needs within reach.   Therapy Documentation Precautions:  Precautions Precautions: Fall, Cervical Precaution Booklet Issued: No Precaution Comments: cortrak, bilateral soft mitts Required Braces or Orthoses: Sling, Cervical Brace Cervical Brace: At all times, Hard collar Restrictions Weight Bearing Restrictions: Yes RUE Weight Bearing: Non weight bearing Other Position/Activity Restrictions: in sling, nonoperative mgmt per ortho 9/18   Therapy/Group: Individual Therapy  Ithzel Fedorchak L Eulogio Requena PT, DPT, NCS, CBIS  01/27/2022, 7:01 PM

## 2022-01-27 NOTE — Progress Notes (Signed)
Occupational Therapy Weekly Progress Note  Patient Details  Name: Dominic Smith MRN: [id number] Date of Birth: [birth year]  Beginning of progress report period: January 20, 2022 End of progress report period: January 27, 2022  Today's Date: 01/27/2022 OT Individual Time: 0700-0808 OT Individual Time Calculation (min): 68 min    Patient has met 2 of 4 short term goals.  Pt is making slow progress and has been limited by lethargy, confusion, and orthostatic hypotension. Pt continues to remain at a total A level for ADLs and can fluctuate from as good as min A for functional transfers and up to MAX A for sit to stand if pt unable to imitate. Once up on feet consistently MIN-MOD A for mobility in room and hallway until fatigue/BP drop. Pt remains tangential, confused and oriented to self only. No agitation present and unable to follow 1 step commands. Pt continues to have impaired initiation and praxis impacting performance of BADLs  Patient continues to demonstrate the following deficits: muscle weakness, decreased cardiorespiratoy endurance, impaired timing and sequencing, unbalanced muscle activation, motor apraxia, ataxia, decreased coordination, and decreased motor planning, decreased visual acuity and decreased visual perceptual skills, decreased motor planning and ideational apraxia, decreased initiation, decreased attention, decreased awareness, decreased problem solving, decreased safety awareness, decreased memory, delayed processing, and demonstrates behaviors consistent with Rancho Level 5, and decreased sitting balance, decreased standing balance, decreased postural control, decreased balance strategies, and difficulty maintaining precautions and therefore will continue to benefit from skilled OT intervention to enhance overall performance with BADL and Reduce care partner burden.  Patient progressing toward long term goals..  Continue plan of care.  OT Short Term Goals Week 1:  OT  Short Term Goal 1 (Week 1): Pt will consistently transfer to toilet with 1 caregiver OT Short Term Goal 1 - Progress (Week 1): Met OT Short Term Goal 2 (Week 1): Pt will complete 1/4 steps of donning shirt OT Short Term Goal 2 - Progress (Week 1): Progressing toward goal OT Short Term Goal 3 (Week 1): Pt will complete oral care with MOD A OT Short Term Goal 3 - Progress (Week 1): Progressing toward goal OT Short Term Goal 4 (Week 1): Pt will sit to stand with MOD A or less OT Short Term Goal 4 - Progress (Week 1): Met Week 2:  OT Short Term Goal 1 (Week 2): Pt will complete 1/4 steps of donning shirt OT Short Term Goal 2 (Week 2): Pt will wash 2/10 body parts OT Short Term Goal 3 (Week 2): Pt will initate oral care with min HOH A OT Short Term Goal 4 (Week 2): Pt will consistently initate sit to stand wiht no more than MIN A  Skilled Therapeutic Interventions/Progress Updates:     Pt received in bed with  no report of pain. Pt initially verbalizing with OT at beginning of session but fluctuates throughout.  Donned thigh high teds and ace wraps for BP management. Also provided prolonged stretch of hamstrings and gastroc/soleus d/t pt preferred position in bed with very flexed knees and unable to follow commands for repositioning. Patient required increased time for initiation, cuing, rest breaks, and for completion of tasks throughout session. Utilized therapeutic use of self throughout to promote efficiency.    ADL: Pt completes ADL at overall max-total A Level. Skilled interventions include: hand over hand to initiate steps of task, reassurance that tasks could be completed as pt often perseverating on stating, "its impossible," gentle reorientation with no recall/carryover/acknowledgement of meaning of orientation  information often staring off into space, increased time for pt to process cues to attempt to imitate, and safe repositioning on EOB. Pt on new air mattress which demoed increased  challenge to maintain seated upright position EOB with increased L lean this date suspect d/t air chaimbers of mattress as this was not present in stanidng. Pt able ot sit to stand from EOB with heavy MOD A and OT faciliates weight shifting laterally to improve positioning towards HOB.  BP assessed throughout as follows: Supine with teds and aces: 144/73 EOB: 134/68 EOB after washing 126/58   Pt left at end of session in bed semi fowlers wit HOB very elevated with exit alarm on, call light in reach and all needs met  ABS discontinued d/t ABS score less than 20 for the last three days or no behaviors present   Therapy Documentation Precautions:  Precautions Precautions: Fall, Cervical Precaution Booklet Issued: No Precaution Comments: cortrak, bilateral soft mitts Required Braces or Orthoses: Sling, Cervical Brace Cervical Brace: At all times, Hard collar Restrictions Weight Bearing Restrictions: Yes RUE Weight Bearing: Non weight bearing Other Position/Activity Restrictions: in sling, nonoperative mgmt per ortho 9/18 Therapy/Group: Individual Therapy  [name] 01/27/2022, 6:54 AM

## 2022-01-28 DIAGNOSIS — R7989 Other specified abnormal findings of blood chemistry: Secondary | ICD-10-CM

## 2022-01-28 DIAGNOSIS — J690 Pneumonitis due to inhalation of food and vomit: Secondary | ICD-10-CM

## 2022-01-28 LAB — COMPREHENSIVE METABOLIC PANEL
ALT: 34 U/L (ref 0–44)
AST: 28 U/L (ref 15–41)
Albumin: 2 g/dL — ABNORMAL LOW (ref 3.5–5.0)
Alkaline Phosphatase: 161 U/L — ABNORMAL HIGH (ref 38–126)
Anion gap: 8 (ref 5–15)
BUN: 34 mg/dL — ABNORMAL HIGH (ref 8–23)
CO2: 24 mmol/L (ref 22–32)
Calcium: 8.6 mg/dL — ABNORMAL LOW (ref 8.9–10.3)
Chloride: 108 mmol/L (ref 98–111)
Creatinine, Ser: 0.93 mg/dL (ref 0.61–1.24)
GFR, Estimated: >60 mL/min (ref >60)
Glucose, Bld: 114 mg/dL — ABNORMAL HIGH (ref 70–99)
Potassium: 4.3 mmol/L (ref 3.5–5.1)
Sodium: 140 mmol/L (ref 135–145)
Total Bilirubin: 0.6 mg/dL (ref 0.3–1.2)
Total Protein: 5.9 g/dL — ABNORMAL LOW (ref 6.5–8.1)

## 2022-01-28 LAB — GLUCOSE, CAPILLARY
Glucose-Capillary: 114 mg/dL — ABNORMAL HIGH (ref 70–99)
Glucose-Capillary: 84 mg/dL (ref 70–99)
Glucose-Capillary: 87 mg/dL (ref 70–99)
Glucose-Capillary: 94 mg/dL (ref 70–99)
Glucose-Capillary: 96 mg/dL (ref 70–99)
Glucose-Capillary: 99 mg/dL (ref 70–99)

## 2022-01-28 LAB — CBC
HCT: 24.8 % — ABNORMAL LOW (ref 39.0–52.0)
Hemoglobin: 7.9 g/dL — ABNORMAL LOW (ref 13.0–17.0)
MCH: 29.2 pg (ref 26.0–34.0)
MCHC: 31.9 g/dL (ref 30.0–36.0)
MCV: 91.5 fL (ref 80.0–100.0)
Platelets: 500 10*3/uL — ABNORMAL HIGH (ref 150–400)
RBC: 2.71 MIL/uL — ABNORMAL LOW (ref 4.22–5.81)
RDW: 13.3 % (ref 11.5–15.5)
WBC: 10.3 10*3/uL (ref 4.0–10.5)
nRBC: 0 % (ref 0.0–0.2)

## 2022-01-28 MED ORDER — B COMPLEX-C PO TABS
1.0000 | ORAL_TABLET | Freq: Every day | ORAL | Status: DC
  Administered 2022-01-28 – 2022-02-10 (×14): 1 via NASOGASTRIC
  Filled 2022-01-28 (×16): qty 1

## 2022-01-28 MED ORDER — FERROUS SULFATE 220 (44 FE) MG/5ML PO ELIX
220.0000 mg | ORAL_SOLUTION | Freq: Every day | ORAL | Status: DC
  Administered 2022-01-29 – 2022-02-10 (×12): 220 mg
  Filled 2022-01-28 (×14): qty 5

## 2022-01-28 MED ORDER — SODIUM CHLORIDE 0.9 % IV SOLN: INTRAVENOUS | Status: AC

## 2022-01-28 MED ORDER — DOXAZOSIN MESYLATE 1 MG PO TABS
1.0000 mg | ORAL_TABLET | Freq: Every day | ORAL | Status: DC
  Administered 2022-01-28 – 2022-02-08 (×12): 1 mg via NASOGASTRIC
  Filled 2022-01-28 (×13): qty 1

## 2022-01-28 NOTE — Progress Notes (Addendum)
PROGRESS NOTE   Subjective/Complaints:  Slept some last night. Arousal seems to come and go. Pretty alert this morning. Up at nurses station  ROS: limited d/t cognition.    Objective:   No results found.  Recent Labs    01/28/22 0505  WBC 10.3  HGB 7.9*  HCT 24.8*  PLT 500*    Recent Labs    01/28/22 0505  NA 140  K 4.3  CL 108  CO2 24  GLUCOSE 114*  BUN 34*  CREATININE 0.93  CALCIUM 8.6*     Intake/Output Summary (Last 24 hours) at 01/28/2022 1443 Last data filed at 01/28/2022 1540 Gross per 24 hour  Intake 720 ml  Output 2201 ml  Net -1481 ml     Pressure Injury 01/13/22 Coccyx Mid Unstageable - Full thickness tissue loss in which the base of the injury is covered by slough (yellow, tan, gray, green or brown) and/or eschar (tan, brown or black) in the wound bed. skin is broken with redness at the (Active)  01/13/22 1930  Location: Coccyx  Location Orientation: Mid  Staging: Unstageable - Full thickness tissue loss in which the base of the injury is covered by slough (yellow, tan, gray, green or brown) and/or eschar (tan, brown or black) in the wound bed.  Wound Description (Comments): skin is broken with redness at the base  Present on Admission: Yes (present on admission to rehab 9/27)    Physical Exam: Vital Signs Blood pressure (!) 156/81, pulse 98, temperature 98.3 F (36.8 C), temperature source Axillary, resp. rate 18, height 5\' 11"  (1.803 m), weight 66.2 kg, SpO2 98 %.  Constitutional: No distress . Vital signs reviewed. HEENT: NCAT, EOMI, oral membranes moist Neck: [place] j collar fitting better Cardiovascular: RRR without murmur. No JVD    Respiratory/Chest: CTA Bilaterally without wheezes or rales. Normal effort    GI/Abdomen: BS +, non-tender, non-distended Ext: no clubbing, cyanosis, or edema Psych: pleasantly confused Skin: wounds at knees, both hands, left elbow, right ankle.  Scrotum excoriated. Sacral wound present. Foam dressings at based of collar Left PIV stable. Neuro: very alert. Confused, LOC. Receptive language deficits? Phonation excellent today.   Moves all 4 limbs spontaneously but difficut to grade. Musculoskeletal: no focal jt pain, nl prom.    Assessment/Plan: 1. Functional deficits which require 3+ hours per day of interdisciplinary therapy in a comprehensive inpatient rehab setting. Physiatrist is providing close team supervision and 24 hour management of active medical problems listed below. Physiatrist and rehab team continue to assess barriers to discharge/monitor patient progress toward functional and medical goals  Care Tool:  Bathing        Body parts bathed by helper: Right arm, Left arm, Chest, Abdomen, Front perineal area, Buttocks, Right upper leg, Left upper leg, Right lower leg, Left lower leg, Face     Bathing assist Assist Level: 2 Helpers     Upper Body Dressing/Undressing Upper body dressing   What is the patient wearing?: Pull over shirt    Upper body assist Assist Level: Dependent - Patient 0%    Lower Body Dressing/Undressing Lower body dressing  Lower body assist Assist for lower body dressing: 2 Helpers     Toileting Toileting    Toileting assist Assist for toileting: 2 Helpers     Transfers Chair/bed transfer  Transfers assist     Chair/bed transfer assist level: Moderate Assistance - Patient 50 - 74%     Locomotion Ambulation   Ambulation assist   Ambulation activity did not occur: Safety/medical concerns  Assist level: Moderate Assistance - Patient 50 - 74% Assistive device: Other (comment) (IV pole on L) Max distance: 200 ft   Walk 10 feet activity   Assist  Walk 10 feet activity did not occur: Safety/medical concerns  Assist level: Moderate Assistance - Patient - 50 - 74% Assistive device: No Device   Walk 50 feet activity   Assist Walk 50 feet with 2 turns  activity did not occur: Safety/medical concerns  Assist level: Moderate Assistance - Patient - 50 - 74%      Walk 150 feet activity   Assist Walk 150 feet activity did not occur: Safety/medical concerns  Assist level: Moderate Assistance - Patient - 50 - 74%      Walk 10 feet on uneven surface  activity   Assist Walk 10 feet on uneven surfaces activity did not occur: Safety/medical concerns         Wheelchair     Assist Is the patient using a wheelchair?: Yes Type of Wheelchair: Manual    Wheelchair assist level: Dependent - Patient 0% (TIS)      Wheelchair 50 feet with 2 turns activity    Assist    Wheelchair 50 feet with 2 turns activity did not occur: Safety/medical concerns       Wheelchair 150 feet activity     Assist  Wheelchair 150 feet activity did not occur: Safety/medical concerns       Blood pressure (!) 156/81, pulse 98, temperature 98.3 F (36.8 C), temperature source Axillary, resp. rate 18, height 5\' 11"  (1.803 m), weight 66.2 kg, SpO2 98 %.  Medical Problem List and Plan: 1. 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 Family is working on them   2.  Antithrombotics: -DVT/anticoagulation:  Pharmaceutical: Lovenox 30 mg BID             -antiplatelet therapy: aspirin 81 mg daily 3. Pain Management: Tylenol per tube scheduled,\; oxycodone, Robaxin prn 4. Mood/Behavior/Sleep: LCSW to evaluate and provide emotional support             -antipsychotic agents: add seroquel 25mg  qhs prn sleep/agitation             -question underlying cognitive impairment  -sleep chart - slept >7 hours 9/29  - 10/1 - started ritalin 5 mg BID 0600 and 1200 for arousal with benefit  -10/6 continue ritalin at 10mg  bid--has helped arousal although it's still not consistent 5.  Neuropsych/cognition: This patient is not capable of making decisions on his own behalf.  -telesitter, soft waist belt, mittens for safety            - Seroquel 25 mg scheduled qhs and qhs PRN  -continue sleep chart   6. Skin/Wound Care: routine  skin care checks             -monitor scalp lac/skin abrasions             -appreciate WOC recs for unstageable sacral/coccyx pressure injury   -medihoney/foam dressing 7. Fluids/Electrolytes/Nutrition/dysphagia: Strict Is and Os and follow-up chemistries             -NPO; SLP eval             -Continue tube feeds via Cortrak: - Osmolite 1.5 @ 50 ml/hr (1200 ml/day) - PROSource TF20 60 ml daily - Free water flushes 200 ml q 6 hours -10/6 BUN up to 34  -add NS IVF at 50cc/hr from 3p-7a daily  -recheck bmet tomorrow  -mbs today per slp?  -may need to decide on PEG soon 8: Right distal clavicle and humerus fracture:  -continue sling; follow-up with Dr. Marcelino Scot on Monday -NWB RUE, pendulum activiites  9: Right 1 st rib fracture: pneumothorax resolved; IS/FV 10: C5-C6, C4 and C7 transverse process fractures:  -continue cervical collar; follow-up with Dr Annette Stable 11: Possible right VA and ICA injuries: daily aspirin 13: Urinary retention: has indwelling Foley catheter             -Cardura started 9/25--stopped d/t hypotension  10/6 resume cardura   -dc foley cath   -voiding trial, I/o cath prn 15: SAH:  completed 7 days of Keppra; follow-up with neurosurgery 16: Volume overload/peripheral edema: received lasix 40mg  x 1 last week             -strict Is and Os and daily weights             -weights are stable Filed Weights   01/25/22 0451 01/26/22 0443 01/27/22 0418  Weight: 66 kg 65.5 kg 66.2 kg    -10/6 is now a little volume depleted -IVF as above 17: AKI: see above 18: Elevated transaminases: trending downward; follow-up CMP tomorrow 19: Leukocytosis, ?aspiration pneumonia w/x hx of staph and pseudomonas - 9/28 CXR - patchy airspace disease  in RUL, left perihilar -vancomycin and cefepime total of 7 more days ended 10/5 -continue robitussin             -urine culture on 9/21 negative             -wbcs 12k 10/2             - IM signed off 9/29  -remains on MRSA precautions for trach aspirate 20: SVT/sinus tach intermittently: monitor 21: Mediastinal hematoma: Echo WNL; Hgb stable 22: Ankle edema and ecchymosis: x-rays negative for osseous abnormality           Continue TEDS, elevation in bed 23. Loose stools. No s/s infectious diarrhea. Likely d/t abx and Tfs. Getting lomotil PRN.    -should improve  now that he's off abx---still somewhat loose 24. Normocytic anemia/ABLA: likely related to hospitalization and chronic disease  -no gross blood loss  -added b complex and iron  -continue to follow    At least 50 total minutes were spent in examination of patient, assessment of pertinent data,  formulation of a treatment plan, and in discussion with patient and/or family.    LOS: 9 days A FACE TO FACE EVALUATION WAS PERFORMED  Meredith Staggers 01/28/2022, 9:06 AM

## 2022-01-28 NOTE — Progress Notes (Signed)
Occupational Therapy TBI Note  Patient Details  Name: Dominic Smith MRN: [id number] Date of Birth: 06/18/1940  Today's Date: 01/28/2022 OT Individual Time: 0700-0758 OT Individual Time Calculation (min): 58 min    Short Term Goals: Week 1:  OT Short Term Goal 1 (Week 1): Pt will consistently transfer to toilet with 1 caregiver OT Short Term Goal 1 - Progress (Week 1): Met OT Short Term Goal 2 (Week 1): Pt will complete 1/4 steps of donning shirt OT Short Term Goal 2 - Progress (Week 1): Progressing toward goal OT Short Term Goal 3 (Week 1): Pt will complete oral care with MOD A OT Short Term Goal 3 - Progress (Week 1): Progressing toward goal OT Short Term Goal 4 (Week 1): Pt will sit to stand with MOD A or less OT Short Term Goal 4 - Progress (Week 1): Met  Skilled Therapeutic Interventions/Progress Updates:     Pt received in bed with no pain. Pt with teds and ACE wraps left on over night with indentations left in skin. Removed from pt to give skin a break from pressure. BP semi fowlers with only ambominal binder 134/75. BP after transfer to TIS 124/72 in TIS after dressing 134/69 with no teds on. Alerted next clinician to put teds on prior to mobility.   ADL: Pt completes ADL at overall total A Level. Skilled interventions include: minimally better command following this date with decreased time to initiate and correct motor response 40-50% of trials during ADL. Pt often internally distracted requiring repetition or clarification of command. Pants donned bed level, gown seated in TIS with pt able ot initiate steps of doffing/donning shirt. Pt able ot wash face appropriately with wash cloth. Transfer to TIS with MIN A wit LUE over OT shoulder.   Pt left at end of session in TIS at RN station with exit alarm on, call light in reach and all needs met   Therapy Documentation Precautions:  Precautions Precautions: Fall, Cervical Precaution Booklet Issued: No Precaution Comments:  cortrak, bilateral soft mitts Required Braces or Orthoses: Sling, Cervical Brace Cervical Brace: At all times, Hard collar Restrictions Weight Bearing Restrictions: Yes RUE Weight Bearing: Non weight bearing Other Position/Activity Restrictions: in sling, nonoperative mgmt per ortho 9/18  Agitated Behavior Scale: TBI ABS discontinued d/t ABS score less than 20 for the last three days or no behaviors present       Therapy/Group: Individual Therapy  [name] 01/28/2022, 6:52 AM

## 2022-01-28 NOTE — Progress Notes (Signed)
Occupational Therapy TBI Note  Patient Details  Name: Dominic Smith MRN: [id number] Date of Birth: 02-Aug-1940  Today's Date: 01/28/2022 OT Individual Time: 6295-2841 OT Individual Time Calculation (min): 45 min    Short Term Goals: Week 1:  OT Short Term Goal 1 (Week 1): Pt will consistently transfer to toilet with 1 caregiver OT Short Term Goal 1 - Progress (Week 1): Met OT Short Term Goal 2 (Week 1): Pt will complete 1/4 steps of donning shirt OT Short Term Goal 2 - Progress (Week 1): Progressing toward goal OT Short Term Goal 3 (Week 1): Pt will complete oral care with MOD A OT Short Term Goal 3 - Progress (Week 1): Progressing toward goal OT Short Term Goal 4 (Week 1): Pt will sit to stand with MOD A or less OT Short Term Goal 4 - Progress (Week 1): Met  Skilled Therapeutic Interventions/Progress Updates:     Upon OT arrival, pt seated in TIS w/c at nurses station reporting no pain and is agreeable to OT treatment session. Treatment intervention with a focus on command following, sequencing, coordination, activity tolerance, and self care. Pt was transported to dayroom gym via TIS and total A. While seated at tabletop, pt retrieves large wooden pegs from container and places into peg board using the L UE. Pt demonstrates mod difficulty to perform task requiring max verbal cues and min hand over hand assist. Pt able to place 4 pegs into peg board before task was altered. Pt removes pegs from peg board that therapist placed using the L UE. Pt fatigued requiring max verbal cues to attend to task. Pt requires mod hand over hand assist to remove all pegs Increased time required. TED hose donned dependently and socks managed dependently. Pt was left in TIS at nurses station with all needs met and safety measures in place.   Therapy Documentation Precautions:  Precautions Precautions: Fall, Cervical Precaution Booklet Issued: No Precaution Comments: cortrak, bilateral soft mitts Required  Braces or Orthoses: Sling, Cervical Brace Cervical Brace: At all times, Hard collar Restrictions Weight Bearing Restrictions: Yes RUE Weight Bearing: Non weight bearing Other Position/Activity Restrictions: in sling, nonoperative mgmt per ortho 9/18  TBI Observation Details Observation Environment: CIR Start of observation period - Date: 01/28/22 Start of observation period - Time: 0900 End of observation period - Date: 01/28/22 End of observation period - Time: 0945 Agitated Behavior Scale (DO NOT LEAVE BLANKS) Short attention span, easy distractibility, inability to concentrate: Present to an extreme degree Impulsive, impatient, low tolerance for pain or frustration: Absent Uncooperative, resistant to care, demanding: Absent Violent and/or threatening violence toward people or property: Absent Explosive and/or unpredictable anger: Absent Rocking, rubbing, moaning, or other self-stimulating behavior: Absent Pulling at tubes, restraints, etc.: Absent Wandering from treatment areas: Absent Restlessness, pacing, excessive movement: Absent Repetitive behaviors, motor, and/or verbal: Absent Rapid, loud, or excessive talking: Absent Sudden changes of mood: Absent Easily initiated or excessive crying and/or laughter: Absent Self-abusiveness, physical and/or verbal: Absent Agitated behavior scale total score: 17   Therapy/Group: Individual Therapy  Montgomery Favor 01/28/2022, 11:22 AM

## 2022-01-28 NOTE — Progress Notes (Signed)
Physical Therapy TBI Note  Patient Details  Name: Dominic Smith MRN: [id number] Date of Birth: 1941/01/20  Today's Date: 01/28/2022 PT Individual Time: 1031-1113 PT Individual Time Calculation (min): 42 min   Short Term Goals: Week 2:  PT Short Term Goal 1 (Week 2): Patient will perform basic transfers with min A >50% of the time using LRAD. PT Short Term Goal 2 (Week 2): Patient will perform bed mobility with min A >50% of the time. PT Short Term Goal 3 (Week 2): Patient will ambulate >200 ft with min A using LRAD. PT Short Term Goal 4 (Week 2): Patient will initiate stair training.   Skilled Therapeutic Interventions/Progress Updates:  Patient seated in w/c and asleep at nurses' station. Patient easily roused with time and agreeable to PT session. Continuous feed running.   Patient with no pain complaint at start of session.  Seated BP taken on adjustment of w/c to upright sitting position and reading at 115/66 (82) and pulse 88 bpm. Attempt for standing BP but cut short d/t need of ortho gym for class.  Therapeutic Activity: Transfers: Pt performed sit<>stand and stand pivot transfers w/c<>mat table with Mod/ maxA using pole for LUE support and  physical positioning of L hand at armrest for push to stand to pole. Provided vc/ tc throughout for sequencing of steps to improve technique. Extra time required to perform.   Patient returned to nurses' desk at end of session with brakes locked, chair tilted, lap belt secured, hand mitts donned, and all needs within reach. Provided pt with warmed blanket d/t c/o being cold.    Therapy Documentation Precautions:  Precautions Precautions: Fall, Cervical Precaution Booklet Issued: No Precaution Comments: cortrak, bilateral soft mitts Required Braces or Orthoses: Sling, Cervical Brace Cervical Brace: At all times, Hard collar Restrictions Weight Bearing Restrictions: Yes RUE Weight Bearing: Non weight bearing Other Position/Activity  Restrictions: in sling, nonoperative mgmt per ortho 9/18 General:   Vital Signs:  Pain:  No pain indicated this session.  Agitated Behavior Scale: TBI Observation Details Observation Environment: CIR Start of observation period - Date: 01/28/22 Start of observation period - Time: 1030 End of observation period - Date: 01/28/22 End of observation period - Time: 1115 Agitated Behavior Scale (DO NOT LEAVE BLANKS) Short attention span, easy distractibility, inability to concentrate: Present to an extreme degree Impulsive, impatient, low tolerance for pain or frustration: Absent Uncooperative, resistant to care, demanding: Absent Violent and/or threatening violence toward people or property: Absent Explosive and/or unpredictable anger: Absent Rocking, rubbing, moaning, or other self-stimulating behavior: Absent Pulling at tubes, restraints, etc.: Absent Wandering from treatment areas: Absent Restlessness, pacing, excessive movement: Absent Repetitive behaviors, motor, and/or verbal: Absent Rapid, loud, or excessive talking: Absent Sudden changes of mood: Absent Easily initiated or excessive crying and/or laughter: Absent Self-abusiveness, physical and/or verbal: Absent Agitated behavior scale total score: 17   Therapy/Group: Individual Therapy  Alger Simons PT, DPT, CSRS 01/28/2022, 10:02 AM

## 2022-01-28 NOTE — Progress Notes (Signed)
Speech Language Pathology TBI Note  Patient Details  Name: Dominic Smith MRN: [id number] Date of Birth: 14-May-1940  Today's Date: 01/28/2022 SLP Individual Time: 6578-4696 SLP Individual Time Calculation (min): 45 min  Short Term Goals: Week 2: SLP Short Term Goal 1 (Week 2): Patient will consume trials of ice chips and initiate a swallow response in 100% of opportunities with Mod verbal cues to assess readiness for MBS. SLP Short Term Goal 2 (Week 2): Patient will demonstrate sustained attention to a functional task for 2 minutes with Max A multimodal cues for redirection. SLP Short Term Goal 3 (Week 2): Patient will orient to place, time and situation with Max A multimodal cues. SLP Short Term Goal 4 (Week 2): Patient will answer yes/no questions with 25% accuracy with Max A multimodal cues. SLP Short Term Goal 5 (Week 2): Patient will follow 1-step commands in 25% of opportunities with Max A multimodal cues.  Skilled Therapeutic Interventions: Skilled treatment session focused on cognitive and dysphagia goals. Upon arrival, patient was asleep and appeared lethargic. However, patient easily roused with verbal cues and repositioning and remained awake throughout the entirety of the session. SLP facilitated session by providing hand over hand assist for patient to initiate oral care via the suction toothbrush. Patient with increased attention to task but SLP had to complete task for thoroughness. SLP provided trials of ice chips and thin liquids via spoon and cup. Patient initially with decreased attention/oral acceptance of trials but eventually appeared to demonstrate a timely swallow response without an intermittent, mild wet vocal quality, suspect due to secretions. Recommend patient remain NPO with MBS on Monday to assess swallow function. Total A was needed for orientation to place, time and situation with patient's biggest barrier to progress is his impaired attention. Patient left upright in  wheelchair at RN station. Continue with current plan of care.      Pain No/Denies Pain   Agitated Behavior Scale: TBI  ABS discontinued d/t ABS score less than 20 for the last three days or no behaviors present   Therapy/Group: Individual Therapy  Abdiel Blackerby 01/28/2022, 3:11 PM

## 2022-01-28 NOTE — Progress Notes (Signed)
Physical Therapy TBI Note  Patient Details  Name: Dominic Smith MRN: [id number] Date of Birth: July 08, 1940  Today's Date: 01/28/2022 PT Individual Time: 1500-1530 PT Individual Time Calculation (min): 30 min   Short Term Goals: Week 1:  PT Short Term Goal 1 (Week 1): pt will initiate gait training PT Short Term Goal 1 - Progress (Week 1): Met PT Short Term Goal 2 (Week 1): Pt will participate in 1 outcome measure PT Short Term Goal 2 - Progress (Week 1): Met PT Short Term Goal 3 (Week 1): Pt will sit OOB between therapy sessions. PT Short Term Goal 3 - Progress (Week 1): Met  Skilled Therapeutic Interventions/Progress Updates:    Pt seated in w/c on arrival and agreeable to therapy. Pt with no signs or symptoms of pain. Pt easily internally distracted throughout session but easily redirectable. Intermittent command following noted. Pt with abd binder and ted hose in place, therapist donned ace wraps. Increased time required for managing cortrak pole and redirection. Pt ambulated ~200 ft to day room door and back to room with min A for balance but max cueing for attention and navigation in hallway. Pt with IV/cortrak pole in L hand, Intermittently leaving it behind or pushing across midline and drifting toward wall on R side. Unclear whether inattention or poor vision. Pt returned to room and remained in [place] chair with waist restraint belt and mittens in place.   Therapy Documentation Precautions:  Precautions Precautions: Fall, Cervical Precaution Booklet Issued: No Precaution Comments: cortrak, bilateral soft mitts Required Braces or Orthoses: Sling, Cervical Brace Cervical Brace: At all times, Hard collar Restrictions Weight Bearing Restrictions: Yes RUE Weight Bearing: Non weight bearing Other Position/Activity Restrictions: in sling, nonoperative mgmt per ortho 9/18 General:    Agitated Behavior Scale: TBI Observation Details Observation Environment: CIR Start of  observation period - Date: 01/28/22 Start of observation period - Time: 1500 End of observation period - Date: 01/28/22 End of observation period - Time: 1540 Agitated Behavior Scale (DO NOT LEAVE BLANKS) Short attention span, easy distractibility, inability to concentrate: Present to an extreme degree Impulsive, impatient, low tolerance for pain or frustration: Absent Uncooperative, resistant to care, demanding: Absent Violent and/or threatening violence toward people or property: Absent Explosive and/or unpredictable anger: Absent Rocking, rubbing, moaning, or other self-stimulating behavior: Absent Pulling at tubes, restraints, etc.: Absent Wandering from treatment areas: Absent Restlessness, pacing, excessive movement: Absent Repetitive behaviors, motor, and/or verbal: Absent Rapid, loud, or excessive talking: Absent Sudden changes of mood: Absent Easily initiated or excessive crying and/or laughter: Absent Self-abusiveness, physical and/or verbal: Absent Agitated behavior scale total score: 17      Therapy/Group: Individual Therapy  Mickel Fuchs 01/28/2022, 3:42 PM

## 2022-01-29 DIAGNOSIS — E8770 Fluid overload, unspecified: Secondary | ICD-10-CM

## 2022-01-29 DIAGNOSIS — R7989 Other specified abnormal findings of blood chemistry: Secondary | ICD-10-CM

## 2022-01-29 DIAGNOSIS — R339 Retention of urine, unspecified: Secondary | ICD-10-CM

## 2022-01-29 LAB — GLUCOSE, CAPILLARY
Glucose-Capillary: 108 mg/dL — ABNORMAL HIGH (ref 70–99)
Glucose-Capillary: 110 mg/dL — ABNORMAL HIGH (ref 70–99)
Glucose-Capillary: 115 mg/dL — ABNORMAL HIGH (ref 70–99)
Glucose-Capillary: 83 mg/dL (ref 70–99)
Glucose-Capillary: 94 mg/dL (ref 70–99)
Glucose-Capillary: 96 mg/dL (ref 70–99)

## 2022-01-29 LAB — BASIC METABOLIC PANEL
Anion gap: 8 (ref 5–15)
BUN: 29 mg/dL — ABNORMAL HIGH (ref 8–23)
CO2: 23 mmol/L (ref 22–32)
Calcium: 8.2 mg/dL — ABNORMAL LOW (ref 8.9–10.3)
Chloride: 109 mmol/L (ref 98–111)
Creatinine, Ser: 0.92 mg/dL (ref 0.61–1.24)
GFR, Estimated: >60 mL/min (ref >60)
Glucose, Bld: 114 mg/dL — ABNORMAL HIGH (ref 70–99)
Potassium: 4 mmol/L (ref 3.5–5.1)
Sodium: 140 mmol/L (ref 135–145)

## 2022-01-29 MED ORDER — LIDOCAINE HCL URETHRAL/MUCOSAL 2 % EX GEL
1.0000 | Freq: Once | CUTANEOUS | Status: AC
  Administered 2022-01-29: 1 via URETHRAL
  Filled 2022-01-29: qty 6

## 2022-01-29 NOTE — Progress Notes (Signed)
Occupational Therapy TBI Note  Patient Details  Name: Dominic Smith MRN: [id number] Date of Birth: 1940-09-15  Today's Date: 01/29/2022 OT Individual Time: 1120-1205 OT Individual Time Calculation (min): 45 min    Short Term Goals: Week 1:  OT Short Term Goal 1 (Week 1): Pt will consistently transfer to toilet with 1 caregiver OT Short Term Goal 1 - Progress (Week 1): Met OT Short Term Goal 2 (Week 1): Pt will complete 1/4 steps of donning shirt OT Short Term Goal 2 - Progress (Week 1): Progressing toward goal OT Short Term Goal 3 (Week 1): Pt will complete oral care with MOD A OT Short Term Goal 3 - Progress (Week 1): Progressing toward goal OT Short Term Goal 4 (Week 1): Pt will sit to stand with MOD A or less OT Short Term Goal 4 - Progress (Week 1): Met  Skilled Therapeutic Interventions/Progress Updates:    Pt received in bed with RN and sursing student in room. Pt pleasant but SO confused thinking this clinician was his grandkid, that we both had the same grandfather, and that this clinician's grandmother would have been fun to know "physically."   ADL: Pt teds donned total A. Patient required increased time for initiation, cuing, rest breaks, and for completion of tasks throughout session. Utilized therapeutic use of self throughout to promote efficiency. Sup>sit total A with no initiation. Pt able ot sit to stand with MIN A from EOB with MAX redirection to task d/t verbal perseverations/confabulations. Pt walks to bathroom requiring increased time ot follow commands ot sit on toilet. Pt attention so poor unable to urinate on toilet. Returned to TIS in same manner   Therapeutic activity Seated for tabletop task attempted sorting playing cards by color. 3/5 correct with mod cuing for attention to task away from talking.   Pt left at end of session in TIS at RN station with waste belt on and mitts donned.    Therapy Documentation Precautions:  Precautions Precautions: Fall,  Cervical Precaution Booklet Issued: No Precaution Comments: cortrak, bilateral soft mitts Required Braces or Orthoses: Sling, Cervical Brace Cervical Brace: At all times, Hard collar Restrictions Weight Bearing Restrictions: Yes RUE Weight Bearing: Non weight bearing Other Position/Activity Restrictions: in sling, nonoperative mgmt per ortho 9/18 General:      Agitated Behavior Scale: TBI ABS discontinued d/t ABS score less than 20 for the last three days or no behaviors present        Therapy/Group: Individual Therapy  [name] 01/29/2022, 6:50 AM
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
## 2022-01-29 NOTE — Progress Notes (Signed)
Patient's foley was discontinued 10/06 at 17:30 per report. Patient had no void. Bladder scan at midnight-353ml. Attempted to do In/out, unable to pass through. On call MD was called and Coude catheter was ordered to perform in/out.

## 2022-01-29 NOTE — Progress Notes (Signed)
PROGRESS NOTE   Subjective/Complaints:  In bed and alert this AM.  Remembers talking to different doctor today (palliative was here earlier) No new concerns elicited.  Has required IC due to urinary retention.  ROS: limited d/t cognition.    Objective:   No results found.  Recent Labs    01/28/22 0505  WBC 10.3  HGB 7.9*  HCT 24.8*  PLT 500*     Recent Labs    01/28/22 0505 01/29/22 0635  NA 140 140  K 4.3 4.0  CL 108 109  CO2 24 23  GLUCOSE 114* 114*  BUN 34* 29*  CREATININE 0.93 0.92  CALCIUM 8.6* 8.2*      Intake/Output Summary (Last 24 hours) at 01/29/2022 1526 Last data filed at 01/29/2022 0654 Gross per 24 hour  Intake --  Output 1400 ml  Net -1400 ml      Pressure Injury 01/13/22 Coccyx Mid Unstageable - Full thickness tissue loss in which the base of the injury is covered by slough (yellow, tan, gray, green or brown) and/or eschar (tan, brown or black) in the wound bed. skin is broken with redness at the (Active)  01/13/22 1930  Location: Coccyx  Location Orientation: Mid  Staging: Unstageable - Full thickness tissue loss in which the base of the injury is covered by slough (yellow, tan, gray, green or brown) and/or eschar (tan, brown or black) in the wound bed.  Wound Description (Comments): skin is broken with redness at the base  Present on Admission: Yes (present on admission to rehab 9/27)    Physical Exam: Vital Signs Blood pressure 136/73, pulse 91, temperature 98 F (36.7 C), resp. rate 16, height 5\' 11"  (1.803 m), weight 63.5 kg, SpO2 100 %.  Constitutional: No distress . Vital signs reviewed. HEENT: NCAT, conjugate gaze, oral membranes moist Neck: [place] j collar fitting better, cortrack in place Cardiovascular: RRR without murmur. No JVD    Respiratory/Chest: CTA Bilaterally without wheezes or rales. Normal effort    GI/Abdomen: BS +, non-tender, non-distended Ext: no  clubbing, cyanosis, or edema Psych: pleasantly confused Skin: wounds at knees, both hands, left elbow, right ankle. Scrotum excoriated. Sacral wound present. Foam dressings at based of collar Left PIV stable. Neuro:  alert. Confused with confabulations, LOC. Receptive language deficits? Phonation excellent today.   Moves all 4 limbs spontaneously but difficut to grade. Musculoskeletal: no focal jt pain, nl prom.  Restraints in place  Assessment/Plan: 1. Functional deficits which require 3+ hours per day of interdisciplinary therapy in a comprehensive inpatient rehab setting. Physiatrist is providing close team supervision and 24 hour management of active medical problems listed below. Physiatrist and rehab team continue to assess barriers to discharge/monitor patient progress toward functional and medical goals  Care Tool:  Bathing        Body parts bathed by helper: Right arm, Left arm, Chest, Abdomen, Front perineal area, Buttocks, Right upper leg, Left upper leg, Right lower leg, Left lower leg, Face     Bathing assist Assist Level: 2 Helpers     Upper Body Dressing/Undressing Upper body dressing   What is the patient wearing?: Pull over shirt    Upper body  assist Assist Level: Dependent - Patient 0%    Lower Body Dressing/Undressing Lower body dressing            Lower body assist Assist for lower body dressing: 2 Helpers     Toileting Toileting    Toileting assist Assist for toileting: 2 Helpers     Transfers Chair/bed transfer  Transfers assist     Chair/bed transfer assist level: Moderate Assistance - Patient 50 - 74%     Locomotion Ambulation   Ambulation assist   Ambulation activity did not occur: Safety/medical concerns  Assist level: Moderate Assistance - Patient 50 - 74% Assistive device: Other (comment) (IV pole on L) Max distance: 200 ft   Walk 10 feet activity   Assist  Walk 10 feet activity did not occur: Safety/medical  concerns  Assist level: Moderate Assistance - Patient - 50 - 74% Assistive device: No Device   Walk 50 feet activity   Assist Walk 50 feet with 2 turns activity did not occur: Safety/medical concerns  Assist level: Moderate Assistance - Patient - 50 - 74%      Walk 150 feet activity   Assist Walk 150 feet activity did not occur: Safety/medical concerns  Assist level: Moderate Assistance - Patient - 50 - 74%      Walk 10 feet on uneven surface  activity   Assist Walk 10 feet on uneven surfaces activity did not occur: Safety/medical concerns         Wheelchair     Assist Is the patient using a wheelchair?: Yes Type of Wheelchair: Manual    Wheelchair assist level: Dependent - Patient 0% (TIS)      Wheelchair 50 feet with 2 turns activity    Assist    Wheelchair 50 feet with 2 turns activity did not occur: Safety/medical concerns       Wheelchair 150 feet activity     Assist  Wheelchair 150 feet activity did not occur: Safety/medical concerns       Blood pressure 136/73, pulse 91, temperature 98 F (36.7 C), resp. rate 16, height 5\' 11"  (1.803 m), weight 63.5 kg, SpO2 100 %.  Medical Problem List and Plan: 1. 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 Family is working on them   2.  Antithrombotics: -DVT/anticoagulation:  Pharmaceutical: Lovenox 30 mg BID             -antiplatelet therapy: aspirin 81 mg daily 3. Pain Management: Tylenol per tube scheduled,\; oxycodone, Robaxin prn 4. Mood/Behavior/Sleep: LCSW to evaluate and provide emotional support             -antipsychotic agents: add seroquel 25mg  qhs prn sleep/agitation             -question underlying cognitive impairment  -sleep chart - slept >7 hours 9/29  - 10/1 - started ritalin 5 mg BID 0600 and  1200 for arousal with benefit  -10/6 continue ritalin at 10mg  bid--has helped arousal although it's still not consistent 5. Neuropsych/cognition: This patient is not capable of making decisions on his own behalf.  -telesitter, soft waist belt, mittens for safety            -  Seroquel 25 mg scheduled qhs and qhs PRN  -continue sleep chart   6. Skin/Wound Care: routine skin care checks             -monitor scalp lac/skin abrasions             -appreciate WOC recs for unstageable sacral/coccyx pressure injury   -medihoney/foam dressing 7. Fluids/Electrolytes/Nutrition/dysphagia: Strict Is and Os and follow-up chemistries             -NPO; SLP eval             -Continue tube feeds via Cortrak: - Osmolite 1.5 @ 50 ml/hr (1200 ml/day) - PROSource TF20 60 ml daily - Free water flushes 200 ml q 6 hours -10/6 BUN up to 34  -add NS IVF at 50cc/hr from 3p-7a daily  -mbs today per slp?  -may need to decide on PEG soon 10/7 BUN improved to 29, Cr stable at 0.92, continue IVF 8: Right distal clavicle and humerus fracture:  -continue sling; follow-up with Dr. Marcelino Scot on Monday -NWB RUE, pendulum activiites  9: Right 1 st rib fracture: pneumothorax resolved; IS/FV 10: C5-C6, C4 and C7 transverse process fractures:  -continue cervical collar; follow-up with Dr Annette Stable 11: Possible right VA and ICA injuries: daily aspirin 13: Urinary retention: has indwelling Foley catheter             -Cardura started 9/25--stopped d/t hypotension  10/6 resume cardura   -dc foley cath   -voiding trial, I/o cath prn   -Has required IC, continue to monitor PVR  15: SAH:  completed 7 days of Keppra; follow-up with neurosurgery 16: Volume overload/peripheral edema: received lasix 40mg  x 1 last week             -strict Is and Os and daily weights             -weights are stable Filed Weights   01/26/22 0443 01/27/22 0418 01/29/22 0500  Weight: 65.5 kg 66.2 kg 63.5 kg    -10/6 is now a little volume depleted -IVF as  above, weight appears decreased this AM although not sure how accurate this is 17: AKI: see above 18: Elevated transaminases: trending downward; follow-up CMP tomorrow 19: Leukocytosis, ?aspiration pneumonia w/x hx of staph and pseudomonas - 9/28 CXR - patchy airspace disease in RUL, left perihilar -vancomycin and cefepime total of 7 more days ended 10/5 -continue robitussin             -urine culture on 9/21 negative             -wbcs 12k 10/2             - IM signed off 9/29  -remains on MRSA precautions for trach aspirate 20: SVT/sinus tach intermittently: monitor 21: Mediastinal hematoma: Echo WNL; Hgb stable 22: Ankle edema and ecchymosis: x-rays negative for osseous abnormality           Continue TEDS, elevation in bed 23. Loose stools. No s/s infectious diarrhea. Likely d/t abx and Tfs. Getting lomotil PRN.    -should improve  now that he's off abx---still somewhat loose 24. Normocytic anemia/ABLA: likely related to hospitalization and chronic disease  -no gross blood loss  -added b complex and iron  -continue to follow   LOS: 10 days A FACE TO FACE EVALUATION WAS PERFORMED  Jennye Boroughs 01/29/2022, 3:26 PM

## 2022-01-29 NOTE — Progress Notes (Signed)
Speech Language Pathology TBI Note  Patient Details  Name: Dominic Smith MRN: [id number] Date of Birth: 18-Jan-1941  Today's Date: 01/29/2022 SLP Individual Time: 1355-1435 SLP Individual Time Calculation (min): 40 min  Short Term Goals: Week 2: SLP Short Term Goal 1 (Week 2): Patient will consume trials of ice chips and initiate a swallow response in 100% of opportunities with Mod verbal cues to assess readiness for MBS. SLP Short Term Goal 2 (Week 2): Patient will demonstrate sustained attention to a functional task for 2 minutes with Max A multimodal cues for redirection. SLP Short Term Goal 3 (Week 2): Patient will orient to place, time and situation with Max A multimodal cues. SLP Short Term Goal 4 (Week 2): Patient will answer yes/no questions with 25% accuracy with Max A multimodal cues. SLP Short Term Goal 5 (Week 2): Patient will follow 1-step commands in 25% of opportunities with Max A multimodal cues.  Skilled Therapeutic Interventions: Skilled treatment session focused on cognitive and dysphagia goals. Upon arrival, patient was awake in bed. Patient with language of confusion with confabulations resulting in requiring total A for orientation to time, place and situation. Total A was also needed for intellectual awareness of physical deficits (sling/C-collar). Patient verbose throughout session and required Max verbal cues for attention to tasks. Patient performed oral care via the suction toothbrush with Min verbal cues for initiation. SLP assisted with thoroughness. Patient consumed trials of ice chips and thin liquids via spoon without overt s/s of aspiration and demonstrated what appeared to be a timely swallow initiation. Recommend patient remain NPO with plans for MBS on Monday to assess swallow function. Patient left upright in bed with alarm on and mitts in place. Continue with current plan of care.      Pain No/Denies Pain  Agitated Behavior Scale: TBI  ABS discontinued  d/t ABS score less than 20 for the last three days or no behaviors present   Therapy/Group: Individual Therapy  Joyclyn Plazola 01/29/2022, 3:08 PM

## 2022-01-29 NOTE — Progress Notes (Signed)
Patient slept on and off during this shift.

## 2022-01-30 DIAGNOSIS — R197 Diarrhea, unspecified: Secondary | ICD-10-CM

## 2022-01-30 DIAGNOSIS — S069X9D Unspecified intracranial injury with loss of consciousness of unspecified duration, subsequent encounter: Secondary | ICD-10-CM

## 2022-01-30 LAB — GLUCOSE, CAPILLARY
Glucose-Capillary: 104 mg/dL — ABNORMAL HIGH (ref 70–99)
Glucose-Capillary: 106 mg/dL — ABNORMAL HIGH (ref 70–99)
Glucose-Capillary: 106 mg/dL — ABNORMAL HIGH (ref 70–99)
Glucose-Capillary: 117 mg/dL — ABNORMAL HIGH (ref 70–99)
Glucose-Capillary: 91 mg/dL (ref 70–99)
Glucose-Capillary: 94 mg/dL (ref 70–99)

## 2022-01-30 MED ORDER — LIDOCAINE HCL URETHRAL/MUCOSAL 2 % EX GEL: 1.0000 | Freq: Once | CUTANEOUS | Status: DC

## 2022-01-30 MED ORDER — LIDOCAINE HCL URETHRAL/MUCOSAL 2 % EX GEL
1.0000 | Freq: Once | CUTANEOUS | Status: AC
  Administered 2022-01-30: 1 via URETHRAL

## 2022-01-30 NOTE — Progress Notes (Addendum)
PROGRESS NOTE   Subjective/Complaints:  Sitting at nurses station.  Eyes closed but will respond to voice. No new concerns elicited. Nursing reports today he was able to say why he was in the hospital.  Has had urinary retention requiring I/O cath, will restart foley.   ROS: limited d/t cognition.    Objective:   No results found.  Recent Labs    01/28/22 0505  WBC 10.3  HGB 7.9*  HCT 24.8*  PLT 500*     Recent Labs    01/28/22 0505 01/29/22 0635  NA 140 140  K 4.3 4.0  CL 108 109  CO2 24 23  GLUCOSE 114* 114*  BUN 34* 29*  CREATININE 0.93 0.92  CALCIUM 8.6* 8.2*      Intake/Output Summary (Last 24 hours) at 01/30/2022 1555 Last data filed at 01/30/2022 1023 Gross per 24 hour  Intake 2450 ml  Output 2725 ml  Net -275 ml      Pressure Injury 01/13/22 Coccyx Mid Unstageable - Full thickness tissue loss in which the base of the injury is covered by slough (yellow, tan, gray, green or brown) and/or eschar (tan, brown or black) in the wound bed. skin is broken with redness at the (Active)  01/13/22 1930  Location: Coccyx  Location Orientation: Mid  Staging: Unstageable - Full thickness tissue loss in which the base of the injury is covered by slough (yellow, tan, gray, green or brown) and/or eschar (tan, brown or black) in the wound bed.  Wound Description (Comments): skin is broken with redness at the base  Present on Admission: Yes (present on admission to rehab 9/27)    Physical Exam: Vital Signs Blood pressure (!) 103/54, pulse 80, temperature 98 F (36.7 C), resp. rate 15, height 5\' 11"  (1.803 m), weight 63.5 kg, SpO2 100 %.  Constitutional: No distress . Vital signs reviewed. HEENT: NCAT, conjugate gaze, oral membranes moist Neck: [place] j collar in place Cardiovascular: RRR without murmur. No JVD    Respiratory/Chest: CTA Bilaterally without wheezes or rales. Normal effort    GI/Abdomen: Soft,  BS +, non-tender, non-distended Ext: no clubbing, cyanosis, or edema Psych: pleasantly confused Skin: wounds at knees, both hands, left elbow, right ankle. Scrotum excoriated. Sacral wound present. Foam dressings at based of collar Left PIV stable. Neuro: eye closed but responds to voice, Alert to person and hospital, not place or situation, Confused, LOC. Receptive language deficits? Phonation is good today.   Moves all 4 limbs spontaneously but difficut to grade. Musculoskeletal: no focal jt pain, nl prom.     Assessment/Plan: 1. Functional deficits which require 3+ hours per day of interdisciplinary therapy in a comprehensive inpatient rehab setting. Physiatrist is providing close team supervision and 24 hour management of active medical problems listed below. Physiatrist and rehab team continue to assess barriers to discharge/monitor patient progress toward functional and medical goals  Care Tool:  Bathing        Body parts bathed by helper: Right arm, Left arm, Chest, Abdomen, Front perineal area, Buttocks, Right upper leg, Left upper leg, Right lower leg, Left lower leg, Face     Bathing assist Assist Level: 2 Helpers  Upper Body Dressing/Undressing Upper body dressing   What is the patient wearing?: Pull over shirt    Upper body assist Assist Level: Dependent - Patient 0%    Lower Body Dressing/Undressing Lower body dressing            Lower body assist Assist for lower body dressing: 2 Helpers     Toileting Toileting    Toileting assist Assist for toileting: 2 Helpers     Transfers Chair/bed transfer  Transfers assist     Chair/bed transfer assist level: Moderate Assistance - Patient 50 - 74%     Locomotion Ambulation   Ambulation assist   Ambulation activity did not occur: Safety/medical concerns  Assist level: Moderate Assistance - Patient 50 - 74% Assistive device: Other (comment) (IV pole on L) Max distance: 200 ft   Walk 10 feet  activity   Assist  Walk 10 feet activity did not occur: Safety/medical concerns  Assist level: Moderate Assistance - Patient - 50 - 74% Assistive device: No Device   Walk 50 feet activity   Assist Walk 50 feet with 2 turns activity did not occur: Safety/medical concerns  Assist level: Moderate Assistance - Patient - 50 - 74%      Walk 150 feet activity   Assist Walk 150 feet activity did not occur: Safety/medical concerns  Assist level: Moderate Assistance - Patient - 50 - 74%      Walk 10 feet on uneven surface  activity   Assist Walk 10 feet on uneven surfaces activity did not occur: Safety/medical concerns         Wheelchair     Assist Is the patient using a wheelchair?: Yes Type of Wheelchair: Manual    Wheelchair assist level: Dependent - Patient 0% (TIS)      Wheelchair 50 feet with 2 turns activity    Assist    Wheelchair 50 feet with 2 turns activity did not occur: Safety/medical concerns       Wheelchair 150 feet activity     Assist  Wheelchair 150 feet activity did not occur: Safety/medical concerns       Blood pressure (!) 103/54, pulse 80, temperature 98 F (36.7 C), resp. rate 15, height 5\' 11"  (1.803 m), weight 63.5 kg, SpO2 100 %.  Medical Problem List and Plan: 1. 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 Family is working on them   2.  Antithrombotics: -DVT/anticoagulation:  Pharmaceutical: Lovenox 30 mg BID             -antiplatelet therapy: aspirin 81 mg daily 3. Pain Management: Tylenol per tube scheduled,\; oxycodone, Robaxin prn 4. Mood/Behavior/Sleep: LCSW to evaluate and provide emotional support             -antipsychotic agents: add seroquel 25mg  qhs prn sleep/agitation             -question underlying cognitive  impairment  -sleep chart - slept >7 hours 9/29  - 10/1 - started ritalin 5 mg BID 0600 and 1200 for arousal with benefit  -10/6 continue ritalin at 10mg  bid--has helped arousal although it's still not consistent 5. Neuropsych/cognition: This patient is not capable  of making decisions on his own behalf.  -telesitter, soft waist belt, mittens for safety            - Seroquel 25 mg scheduled qhs and qhs PRN  -continue sleep chart   6. Skin/Wound Care: routine skin care checks             -monitor scalp lac/skin abrasions             -appreciate WOC recs for unstageable sacral/coccyx pressure injury   -medihoney/foam dressing 7. Fluids/Electrolytes/Nutrition/dysphagia: Strict Is and Os and follow-up chemistries             -NPO; SLP eval             -Continue tube feeds via Cortrak: - Osmolite 1.5 @ 50 ml/hr (1200 ml/day) - PROSource TF20 60 ml daily - Free water flushes 200 ml q 6 hours -10/6 BUN up to 34  -add NS IVF at 50cc/hr from 3p-7a daily  -mbs today per slp?  -may need to decide on PEG soon 10/7 BUN improved to 29, Cr stable at 0.92, continue IVF BMET tomorrow 8: Right distal clavicle and humerus fracture:  -continue sling; follow-up with Dr. Marcelino Scot on Monday -NWB RUE, pendulum activiites  9: Right 1 st rib fracture: pneumothorax resolved; IS/FV 10: C5-C6, C4 and C7 transverse process fractures:  -continue cervical collar; follow-up with Dr Annette Stable 11: Possible right VA and ICA injuries: daily aspirin 13: Urinary retention: has indwelling Foley catheter             -Cardura started 9/25--stopped d/t hypotension  10/6 resume cardura   -dc foley cath   -voiding trial, I/o cath prn  10/8 -Restart foley cath, has urinary retention and bleeding/irritation with IC 15: SAH:  completed 7 days of Keppra; follow-up with neurosurgery 16: Volume overload/peripheral edema: received lasix 40mg  x 1 last week             -strict Is and Os and daily weights             -weights are  stable Filed Weights   01/27/22 0418 01/29/22 0500 01/30/22 0500  Weight: 66.2 kg 63.5 kg 63.5 kg    -10/6 is now a little volume depleted -IVF as above, weight appears decreased this AM although not sure how accurate this is 17: AKI: see above 18: Elevated transaminases: trending downward; follow-up CMP tomorrow 19: Leukocytosis, ?aspiration pneumonia w/x hx of staph and pseudomonas - 9/28 CXR - patchy airspace disease in RUL, left perihilar -vancomycin and cefepime total of 7 more days ended 10/5 -continue robitussin             -urine culture on 9/21 negative             -wbcs 12k 10/2             - IM signed off 9/29  -remains on MRSA precautions for trach aspirate 20: SVT/sinus tach intermittently: monitor 21: Mediastinal hematoma: Echo WNL; Hgb stable 22: Ankle edema and ecchymosis: x-rays negative for osseous abnormality           Continue TEDS, elevation in bed 23. Loose stools. No s/s infectious diarrhea. Likely d/t abx and Tfs. Getting lomotil PRN.    -should improve  now that he's off abx---still somewhat loose  -10/8 Last BM today, consistency unclear by chart, continue to monitor 24. Normocytic anemia/ABLA: likely related to hospitalization and chronic disease  -no gross blood loss  -added b complex and iron  -  continue to follow   LOS: 11 days A FACE TO FACE EVALUATION WAS PERFORMED  Jennye Boroughs 01/30/2022, 3:55 PM

## 2022-01-30 NOTE — Progress Notes (Signed)
Physical Therapy TBI Note  Patient Details  Name: Dominic Smith MRN: [id number] Date of Birth: February 02, 1941  Today's Date: 01/30/2022 PT Individual Time: 0093-8182 and 1400-1415 PT Individual Time Calculation (min): 70 min and 15 min  Short Term Goals: Week 2:  PT Short Term Goal 1 (Week 2): Patient will perform basic transfers with min A >50% of the time using LRAD. PT Short Term Goal 2 (Week 2): Patient will perform bed mobility with min A >50% of the time. PT Short Term Goal 3 (Week 2): Patient will ambulate >200 ft with min A using LRAD. PT Short Term Goal 4 (Week 2): Patient will initiate stair training.  Skilled Therapeutic Interventions/Progress Updates:     Session 1: Patient in bed with R arm sling and c-collar donned with nursing completing in/out cath upon PT arrival. Patient alert and agreeable to PT session. Patient reported indicated R upper extremity pain with movement and mobility by grimacing during session, RN made aware. PT provided repositioning, rest breaks, and distraction as pain interventions throughout session. R arm sling donned and adjusted to improve support during session. Tube feeds paused and disconnected by nursing during session.   Therapeutic Activity: Bed Mobility: Donned B thigh high TEDs, tightened abdominal binder, donned non-skids socks and pants bed level with total A. Cued patient to lift legs as appropriate with delayed initiation and max cues. Patient performed supine to sit with min-mod A with HOB elevated. Provided verbal cues for initiation and sequencing. Transfers: Patient performed stand pivot bed>TIS w/c with mod A with L arm over therapist's shoulder. Required x3 attempts to stand due to poor initiation from patient and facilitation for weight shifting to initiate turn. He performed sit to/from stand x2 with mod A with L arm over therapist shoulder, improved initiation with increased repetitions. Provided verbal cues for foot placement, forward  weight shift, initiation, and completing turn before sitting. Patient with poor motor planning to sit the last trial requiring +2 assist for patient to prevent sliding out of the chair due to patient sitting early during turn.   Gait Training:  Patient ambulated >50 feet x2 with L arm over therapist shoulder with mod-min A. Ambulated with decreased gait speed, decreased step length and height, narrow BOS, forward trunk lean, and downward head gaze. Provided verbal cues for erect posture and increased step length and width. Facilitated forward propulsion throughout for increased gait speed and step length.  Patient ascended/descended 4x6" steps using L rail with mod-max A. Performed step-to gait pattern throughout. Provided max cues and facilitation for technique and sequencing. Reduced initiation on first step when ascending and descending, improved with repetition, and reduced motor planning noted on turn and descending stairs requiring increased time and cues to complete.   Patient required increased time for initiation, cuing, rest breaks, and for completion of tasks throughout session. Utilized therapeutic use of self throughout to promote efficiency.   Patient in TIS w/c handed off to nursing staff at the nurses station for 1-1 supervision at end of session with breaks locked, soft waist belt restraint secure, and all needs within reach.   Session 2: Patient asleep in TIS w/c at the nurses station upon PT arrival. Patient slow to arouse and agreeable to PT session focus on return to bed due to patient fatigue. Patient denied pain during session. Patient tolerated sitting up after therapy session x2 hours.   Patient transported back to his room dependently via TIS w/c due to patient fatigue.  Therapeutic Activity: Transfers: Patient  performed stand pivot TIS w/c>bed with mod-min A as above. Bed Mobility: Patient performed sit to supine with max A due to fatigue and reduced motor planning.  Provided verbal cues for  maintaining R upper extremity NWB and bringing knees to chest to lift his legs onto the bed. Performed scooting up in bed with mod-max A with patient pushing through B lower extremities following cuing for technique.   Patient in bed with HOB >30 deg at end of session with breaks locked, bed alarm set, Telesitter in place, 4 rails up for patient safety, and all needs within reach.   Therapy Documentation Precautions:  Precautions Precautions: Fall, Cervical Precaution Booklet Issued: No Precaution Comments: cortrak, bilateral soft mitts Required Braces or Orthoses: Sling, Cervical Brace Cervical Brace: At all times, Hard collar Restrictions Weight Bearing Restrictions: Yes RUE Weight Bearing: Non weight bearing Other Position/Activity Restrictions: in sling, nonoperative mgmt per ortho 9/18 Agitated Behavior Scale: TBI Observation Details Observation Environment: CIR Start of observation period - Date: 01/30/22 Start of observation period - Time: 1055 End of observation period - Date: 01/30/22 End of observation period - Time: 1205 Agitated Behavior Scale (DO NOT LEAVE BLANKS) Short attention span, easy distractibility, inability to concentrate: Present to an extreme degree Impulsive, impatient, low tolerance for pain or frustration: Present to a slight degree Uncooperative, resistant to care, demanding: Absent Violent and/or threatening violence toward people or property: Absent Explosive and/or unpredictable anger: Absent Rocking, rubbing, moaning, or other self-stimulating behavior: Absent Pulling at tubes, restraints, etc.: Absent Wandering from treatment areas: Absent Restlessness, pacing, excessive movement: Absent Repetitive behaviors, motor, and/or verbal: Absent Rapid, loud, or excessive talking: Absent Sudden changes of mood: Absent Easily initiated or excessive crying and/or laughter: Absent Self-abusiveness, physical and/or verbal:  Absent Agitated behavior scale total score: 18    Therapy/Group: Individual Therapy  Miryam Mcelhinney L Marygrace Sandoval PT, DPT, NCS, CBIS  01/30/2022, 12:46 PM

## 2022-01-31 ENCOUNTER — Inpatient Hospital Stay (HOSPITAL_COMMUNITY): Payer: Medicare Other

## 2022-01-31 LAB — GLUCOSE, CAPILLARY
Glucose-Capillary: 98 mg/dL (ref 70–99)
Glucose-Capillary: 121 mg/dL — ABNORMAL HIGH (ref 70–99)
Glucose-Capillary: 101 mg/dL — ABNORMAL HIGH (ref 70–99)
Glucose-Capillary: 107 mg/dL — ABNORMAL HIGH (ref 70–99)
Glucose-Capillary: 97 mg/dL (ref 70–99)

## 2022-01-31 LAB — CBC
HCT: 24 % — ABNORMAL LOW (ref 39.0–52.0)
Hemoglobin: 7.8 g/dL — ABNORMAL LOW (ref 13.0–17.0)
MCH: 29.7 pg (ref 26.0–34.0)
MCHC: 32.5 g/dL (ref 30.0–36.0)
MCV: 91.3 fL (ref 80.0–100.0)
Platelets: 347 10*3/uL (ref 150–400)
RBC: 2.63 MIL/uL — ABNORMAL LOW (ref 4.22–5.81)
RDW: 13.4 % (ref 11.5–15.5)
WBC: 7.7 10*3/uL (ref 4.0–10.5)
nRBC: 0 % (ref 0.0–0.2)

## 2022-01-31 LAB — BASIC METABOLIC PANEL
Anion gap: 6 (ref 5–15)
BUN: 27 mg/dL — ABNORMAL HIGH (ref 8–23)
CO2: 25 mmol/L (ref 22–32)
Calcium: 8.2 mg/dL — ABNORMAL LOW (ref 8.9–10.3)
Chloride: 109 mmol/L (ref 98–111)
Creatinine, Ser: 0.84 mg/dL (ref 0.61–1.24)
GFR, Estimated: >60 mL/min (ref >60)
Glucose, Bld: 103 mg/dL — ABNORMAL HIGH (ref 70–99)
Potassium: 4 mmol/L (ref 3.5–5.1)
Sodium: 140 mmol/L (ref 135–145)

## 2022-01-31 MED ORDER — QUETIAPINE 12.5 MG HALF TABLET
12.0000 mg | ORAL_TABLET | Freq: Every day | ORAL | Status: DC
  Administered 2022-01-31: 12.5 mg via ORAL
  Filled 2022-01-31 (×2): qty 1

## 2022-01-31 MED ORDER — AMANTADINE HCL 50 MG/5ML PO SOLN
100.0000 mg | Freq: Every day | ORAL | Status: DC
  Administered 2022-01-31 – 2022-02-08 (×9): 100 mg
  Filled 2022-01-31 (×9): qty 10

## 2022-01-31 NOTE — Progress Notes (Signed)
Modified Barium Swallow Progress Note  Patient Details  Name: Dominic Smith MRN: [id number] Date of Birth: [birth year]  Today's Date: 01/31/2022  Modified Barium Swallow completed.  Full report located under Chart Review in the Imaging Section.  Brief recommendations include the following:  Clinical Impression  Patient demonstrates a mild oropharyngeal dysphagia which is impacted by his overall cognitive functioning. Patient demonstrates lingual pumping/rolling, decreased bolus cohesion, impaired mastication, premature spillage with both solids and liquids and inconsistent timing of the swallow trigger. This results in mild lingual residue with sold textures that intermittently pool in the valleculae and spill over the back of the epiglottis resulting in 1 episode of trace sensed penetration. No aspiration events observed. Recommend patient initiate a diet of dys. 2 textures with thin liquids with full supervision to assist in feeding and utilization of swallowing compensatory strategies.   Swallow Evaluation Recommendations       SLP Diet Recommendations: Dysphagia 2 (Fine chop) solids;Thin liquid   Liquid Administration via: Straw   Medication Administration: Crushed with puree   Supervision: Patient able to self feed;Staff to assist with self feeding;Full supervision/cueing for compensatory strategies   Compensations: Minimize environmental distractions;Slow rate;Small sips/bites   Postural Changes: Seated upright at 90 degrees   Oral Care Recommendations: Oral care BID        Dominic Smith 01/31/2022,3:54 PM

## 2022-01-31 NOTE — Progress Notes (Signed)
Physical Therapy TBI Note  Patient Details  Name: Dominic Smith MRN: [id number] Date of Birth: [birth year]  Today's Date: 01/31/2022 PT Individual Time: 1100-1125 and 1205-1235 PT Individual Time Calculation (min): 25 min and 30 min   Short Term Goals: Week 2:  PT Short Term Goal 1 (Week 2): Patient will perform basic transfers with min A >50% of the time using LRAD. PT Short Term Goal 2 (Week 2): Patient will perform bed mobility with min A >50% of the time. PT Short Term Goal 3 (Week 2): Patient will ambulate >200 ft with min A using LRAD. PT Short Term Goal 4 (Week 2): Patient will initiate stair training.  Skilled Therapeutic Interventions/Progress Updates:     Patient in bed with LPN in the room addressing patient' IV site upon PT arrival. Patient alert and agreeable to PT session. Patient denied pain during session.  Applied B thigh high ACE wraps over thigh high TED hose for BP management with total A. Patient with improved initiation and command follow for lifting and positioning his legs throughout. Performed rolling R/L with max A to doffed minor soiled brief and donn a new brief with total A.   IV team arrived during session to place IV. PT stepped out and returned 30 min later to complete session following IV placement.   On return, patient in the bed demonstrating restless behaviors.   Therapeutic Activity: Bed Mobility: Patient performed supine to sit with min-mod A with HOB elevated. Provided verbal cues for initiation and sequencing. Transfers: Patient performed stand pivot bed>TIS w/c with min-mod A with L arm over therapist's shoulder. Provided cues for initiation and stepping sequencing during turn. He performed sit to/from stand with L HHA with min A and cues for  initiation and forward weight shift.   Gait Training:  Patient ambulated >300 feet using L HHA with min A. Ambulated with decreased gait speed, decreased step length and height, narrow BOS, forward trunk  lean, and downward head gaze. Provided verbal cues for erect posture and increased step length and width. Facilitated forward propulsion throughout for increased gait speed and step length.   Patient in seated in TIS w/c handed off to LPN in the room at end of session with breaks locked, soft waist restraint secured, B mitts donned due to increased arousal and restlessness this afternoon, and all needs within reach.   Therapy Documentation Precautions:  Precautions Precautions: Fall, Cervical Precaution Booklet Issued: No Precaution Comments: cortrak, bilateral soft mitts Required Braces or Orthoses: Sling, Cervical Brace Cervical Brace: At all times, Hard collar Restrictions Weight Bearing Restrictions: Yes RUE Weight Bearing: Non weight bearing Other Position/Activity Restrictions: in sling, nonoperative mgmt per ortho 9/18 Agitated Behavior Scale: TBI Observation Details Observation Environment: pt room Start of observation period - Date: 01/31/22 Start of observation period - Time: 1100 End of observation period - Date: 01/31/22 End of observation period - Time: 1235 Agitated Behavior Scale (DO NOT LEAVE BLANKS) Short attention span, easy distractibility, inability to concentrate: Present to a moderate degree Impulsive, impatient, low tolerance for pain or frustration: Absent Uncooperative, resistant to care, demanding: Absent Violent and/or threatening violence toward people or property: Absent Explosive and/or unpredictable anger: Absent Rocking, rubbing, moaning, or other self-stimulating behavior: Absent Pulling at tubes, restraints, etc.: Present to a slight degree Wandering from treatment areas: Present to a slight degree Restlessness, pacing, excessive movement: Absent Repetitive behaviors, motor, and/or verbal: Absent Rapid, loud, or excessive talking: Absent Sudden changes of mood: Absent Easily initiated or excessive  crying and/or laughter: Absent Self-abusiveness,  physical and/or verbal: Absent Agitated behavior scale total score: 18    Therapy/Group: Individual Therapy  Marlin Brys L Ross Hefferan PT, DPT, NCS, CBIS  01/31/2022, 12:56 PM

## 2022-01-31 NOTE — Progress Notes (Signed)
Physical Therapy TBI Note  Patient Details  Name: Dominic Smith MRN: [id number] Date of Birth: 17-Oct-1940  Today's Date: 01/31/2022 PT Individual Time: 0830-0915 PT Individual Time Calculation (min): 45 min   Short Term Goals: Week 1:  PT Short Term Goal 1 (Week 1): pt will initiate gait training PT Short Term Goal 1 - Progress (Week 1): Met PT Short Term Goal 2 (Week 1): Pt will participate in 1 outcome measure PT Short Term Goal 2 - Progress (Week 1): Met PT Short Term Goal 3 (Week 1): Pt will sit OOB between therapy sessions. PT Short Term Goal 3 - Progress (Week 1): Met Week 2:  PT Short Term Goal 1 (Week 2): Patient will perform basic transfers with min A >50% of the time using LRAD. PT Short Term Goal 2 (Week 2): Patient will perform bed mobility with min A >50% of the time. PT Short Term Goal 3 (Week 2): Patient will ambulate >200 ft with min A using LRAD. PT Short Term Goal 4 (Week 2): Patient will initiate stair training. Week 3:     Skilled Therapeutic Interventions/Progress Updates:    Thigh high teds donned by therapist in supine.  Pt assists by lifting L foot to command.  Nursing in to DC tube feed.  Restraint released by PT. Supine to sit w/heavy cueing to initiate, min assist to complete. In sitting inititally leans to L but this improves w/time in sitting. BP 137/78 Initially refuses gait but able to facilitate initiation w/redirection of attention and cueing/min assist.  Gait 176f pushing IV pole w/therapist navigating path by steering pole. Decreased stance time L, increased L knee flexion thru stance, stooped posture.  Pt attempts to sit on mat but due to poor motor planning fails to turn/max assist to safely transfer to sitting/prevent LOB.  Gait 1067fas above.   Instructed by ST to transfer pt back to bed at end of session due to scheduled MBS.  `Ambulatory transfer to edge of bed w/heavy cueing for sequencing/inititation of stand to sit.  Overall min to mod  assist.  Restraint refastened.  Pt left supine w/rails up x 4 alarm set, bed in lowest position, and needs in reach.   During gait therapist attempts orientation/reorientation but pt w/poor recall and only oriented to self.    Therapy Documentation Precautions:  Precautions Precautions: Fall, Cervical Precaution Booklet Issued: No Precaution Comments: cortrak, bilateral soft mitts Required Braces or Orthoses: Sling, Cervical Brace Cervical Brace: At all times, Hard collar Restrictions Weight Bearing Restrictions: Yes RUE Weight Bearing: Non weight bearing Other Position/Activity Restrictions: in sling, nonoperative mgmt per ortho 9/18   Agitated Behavior Scale: TBI Observation Details Observation Environment: cir Start of observation period - Date: 01/31/22 Start of observation period - Time: 0830 End of observation period - Time: 0915 Agitated Behavior Scale (DO NOT LEAVE BLANKS) Short attention span, easy distractibility, inability to concentrate: Present to an extreme degree Impulsive, impatient, low tolerance for pain or frustration: Present to a slight degree Uncooperative, resistant to care, demanding: Absent Violent and/or threatening violence toward people or property: Absent Explosive and/or unpredictable anger: Absent Rocking, rubbing, moaning, or other self-stimulating behavior: Absent Pulling at tubes, restraints, etc.: Present to a slight degree Wandering from treatment areas: Absent Restlessness, pacing, excessive movement: Present to a slight degree Repetitive behaviors, motor, and/or verbal: Absent Rapid, loud, or excessive talking: Absent Sudden changes of mood: Absent Easily initiated or excessive crying and/or laughter: Absent Self-abusiveness, physical and/or verbal: Absent Agitated behavior scale total  score: 20    Therapy/Group: Individual Therapy  Jerrilyn Cairo 01/31/2022, 9:16 AM

## 2022-01-31 NOTE — Progress Notes (Signed)
PROGRESS NOTE   Subjective/Complaints:  Pt alert in bed. Gave me the thumbs up when I asked how he was doing.   ROS: Limited due to cognitive/behavioral    Objective:   No results found.  Recent Labs    01/31/22 0610  WBC 7.7  HGB 7.8*  HCT 24.0*  PLT 347    Recent Labs    01/29/22 0635 01/31/22 0610  NA 140 140  K 4.0 4.0  CL 109 109  CO2 23 25  GLUCOSE 114* 103*  BUN 29* 27*  CREATININE 0.92 0.84  CALCIUM 8.2* 8.2*     Intake/Output Summary (Last 24 hours) at 01/31/2022 [id number] Last data filed at 01/31/2022 0500 Gross per 24 hour  Intake 100 ml  Output 2850 ml  Net -2750 ml     Pressure Injury 01/13/22 Coccyx Mid Unstageable - Full thickness tissue loss in which the base of the injury is covered by slough (yellow, tan, gray, green or brown) and/or eschar (tan, brown or black) in the wound bed. skin is broken with redness at the (Active)  01/13/22 1930  Location: Coccyx  Location Orientation: Mid  Staging: Unstageable - Full thickness tissue loss in which the base of the injury is covered by slough (yellow, tan, gray, green or brown) and/or eschar (tan, brown or black) in the wound bed.  Wound Description (Comments): skin is broken with redness at the base  Present on Admission: Yes (present on admission to rehab 9/27)    Physical Exam: Vital Signs Blood pressure (!) 147/77, pulse 93, temperature 98.1 F (36.7 C), temperature source Axillary, resp. rate 18, height 5\' 11"  (1.803 m), weight 66.2 kg, SpO2 99 %.  Constitutional: No distress . Vital signs reviewed. HEENT: NCAT, EOMI, oral membranes moist, cortrak Neck: supple Cardiovascular: RRR without murmur. No JVD    Respiratory/Chest: CTA Bilaterally without wheezes or rales. Normal effort    GI/Abdomen: BS +, non-tender, non-distended Ext: no clubbing, cyanosis, or edema Psych: pleasantly confused Skin: wounds at knees, both hands, left elbow,  right ankle. Scrotum excoriated. Sacral wound is still present. Foam dressings at based of collar Neuro: alert. Oriented to self. Speech loud and clear. Couldn't id simple objects. Couldn't tell me he was in hospital. LOC vx Receptive language deficits?     Moves all 4 limbs spontaneously but difficut to grade. Musculoskeletal: no focal jt pain, nl prom.     Assessment/Plan: 1. Functional deficits which require 3+ hours per day of interdisciplinary therapy in a comprehensive inpatient rehab setting. Physiatrist is providing close team supervision and 24 hour management of active medical problems listed below. Physiatrist and rehab team continue to assess barriers to discharge/monitor patient progress toward functional and medical goals  Care Tool:  Bathing        Body parts bathed by helper: Right arm, Left arm, Chest, Abdomen, Front perineal area, Buttocks, Right upper leg, Left upper leg, Right lower leg, Left lower leg, Face     Bathing assist Assist Level: 2 Helpers     Upper Body Dressing/Undressing Upper body dressing   What is the patient wearing?: Pull over shirt    Upper body assist Assist Level: Dependent -  Patient 0%    Lower Body Dressing/Undressing Lower body dressing            Lower body assist Assist for lower body dressing: 2 Helpers     Toileting Toileting    Toileting assist Assist for toileting: 2 Helpers     Transfers Chair/bed transfer  Transfers assist     Chair/bed transfer assist level: Moderate Assistance - Patient 50 - 74%     Locomotion Ambulation   Ambulation assist   Ambulation activity did not occur: Safety/medical concerns  Assist level: Moderate Assistance - Patient 50 - 74% Assistive device: Other (comment) (IV pole on L) Max distance: 200 ft   Walk 10 feet activity   Assist  Walk 10 feet activity did not occur: Safety/medical concerns  Assist level: Moderate Assistance - Patient - 50 - 74% Assistive device: No  Device   Walk 50 feet activity   Assist Walk 50 feet with 2 turns activity did not occur: Safety/medical concerns  Assist level: Moderate Assistance - Patient - 50 - 74%      Walk 150 feet activity   Assist Walk 150 feet activity did not occur: Safety/medical concerns  Assist level: Moderate Assistance - Patient - 50 - 74%      Walk 10 feet on uneven surface  activity   Assist Walk 10 feet on uneven surfaces activity did not occur: Safety/medical concerns         Wheelchair     Assist Is the patient using a wheelchair?: Yes Type of Wheelchair: Manual    Wheelchair assist level: Dependent - Patient 0% (TIS)      Wheelchair 50 feet with 2 turns activity    Assist    Wheelchair 50 feet with 2 turns activity did not occur: Safety/medical concerns       Wheelchair 150 feet activity     Assist  Wheelchair 150 feet activity did not occur: Safety/medical concerns       Blood pressure (!) 147/77, pulse 93, temperature 98.1 F (36.7 C), temperature source Axillary, resp. rate 18, height 5\' 11"  (1.803 m), weight 66.2 kg, SpO2 99 %.  Medical Problem List and Plan: 1. 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 Family is working on them   - I spoke with son at length Friday about his condition, prognosis.  2.  Antithrombotics: -DVT/anticoagulation:  Pharmaceutical: Lovenox 30 mg BID             -antiplatelet therapy: aspirin 81 mg daily 3. Pain Management: Tylenol per tube scheduled,\; oxycodone, Robaxin prn 4. Mood/Behavior/Sleep: LCSW to evaluate and provide emotional support             -antipsychotic agents: add seroquel 25mg  qhs prn sleep/agitation             -question underlying cognitive impairment  -sleep chart - slept >7 hours 9/29  - 10/1 - started ritalin 5  mg BID 0600 and 1200 for arousal with benefit  -10/9 continue ritalin at 10mg  bid--has helped arousal although it's still not consistent   -add amantadine 100mg  daily 5. Neuropsych/cognition: This patient is not capable of making decisions on  his own behalf.  -telesitter, soft waist belt, mittens for safety            - Seroquel 25 mg scheduled qhs and qhs PRN  -continue sleep chart   6. Skin/Wound Care: routine skin care checks             -monitor scalp lac/skin abrasions             -appreciate WOC recs for unstageable sacral/coccyx pressure injury   -medihoney/foam dressing 7. Fluids/Electrolytes/Nutrition/dysphagia: Strict Is and Os and follow-up chemistries             -NPO; SLP eval             -Continue tube feeds via Cortrak: - Osmolite 1.5 @ 50 ml/hr (1200 ml/day) - PROSource TF20 60 ml daily - Free water flushes 200 ml q 6 hours -10/6 BUN up to 34  -added NS IVF at 50cc/hr from 3p-7a daily 10/9 BUN improved to 27, Cr stable ---push po fluids  -MBS today  -continue cortrak 8: Right distal clavicle and humerus fracture:  -continue sling; follow-up with Dr. Marcelino Scot on Monday -NWB RUE, pendulum activiites  9: Right 1 st rib fracture: pneumothorax resolved; IS/FV 10: C5-C6, C4 and C7 transverse process fractures:  -continue cervical collar; follow-up with Dr Annette Stable 11: Possible right VA and ICA injuries: daily aspirin 13: Urinary retention: has indwelling Foley catheter             -Cardura started 9/25--stopped d/t hypotension  10/6 resumed cardura     10/8 -Restarted foley cath, has urinary retention and bleeding/irritation with IC  10/9 will need cath at discharge 15: SAH:  completed 7 days of Keppra; follow-up with neurosurgery 16: Volume overload/peripheral edema: received lasix 40mg  x 1 last week             -strict Is and Os and daily weights             -weights are stable Filed Weights   01/29/22 0500 01/30/22 0500 01/31/22 0500  Weight: 63.5 kg 63.5 kg 66.2 kg     -10/6 is now a little volume depleted -10/9 weight 66kg, don't believe weekend weights were accurate 17: AKI: see above 18: Elevated transaminases: trending downward; follow-up CMP tomorrow 19:  ?aspiration pneumonia w/x hx of staph and pseudomonas - vancomycin and cefepime total of 7 more days ended 10/5 -continue robitussin             10/9 wbcs 7.7 20: SVT/sinus tach intermittently: monitor 21: Mediastinal hematoma: Echo WNL; Hgb stable 22: Ankle edema and ecchymosis: x-rays negative for osseous abnormality           Continue TEDS, elevation in bed 23. Loose stools. No s/s infectious diarrhea. Likely d/t abx and Tfs. Getting lomotil PRN.    -should improve  now that he's off abx---still somewhat loose  -10/8 Last BM today, consistency unclear by chart, continue to monitor 24. Normocytic anemia/ABLA: likely related to hospitalization and chronic disease  -no gross blood loss  -added b complex and iron  -continue to follow  -10/9 hgb 7.8   LOS: 12 days A FACE TO FACE EVALUATION WAS PERFORMED  Meredith Staggers 01/31/2022, 9:05 AM

## 2022-01-31 NOTE — Progress Notes (Signed)
Patient ID: Dominic Smith, male   DOB: 06-02-1940, 81 y.o.   MRN: [id number]  SW returned phone call to pt ex-wife [name]. She had questions in regards to medication - Seroquel. States her son [name] mentioned he was on this medication and it makes him loopy. Reports she would like further clarification on the reason  he is taking this medication. She mentioned POA for pt. SW explained is not currently cognitively able to make decisions, and this would have be completed with an attorney with pt present. SW encouraged guardianship and contacting local Health visitor. She asked about HCPOA. SW explained pt would need to complete this and reiterated cognitively not able to do this right now.  SW also encouraged her to wait for further updates after team conference as pt cognition could improve while here. SW informed will give updates tomorrow. SW informed medical team on above.   Loralee Pacas, MSW, [place] Office: [phone] Cell: [phone] Fax: [phone]

## 2022-01-31 NOTE — Progress Notes (Signed)
Occupational Therapy TBI Note  Patient Details  Name: Dominic Smith MRN: [id number] Date of Birth: 01-Apr-1941  Today's Date: 01/31/2022 OT Individual Time: 1410-1510 OT Individual Time Calculation (min): 60 min    Short Term Goals: Week 2:  OT Short Term Goal 1 (Week 2): Pt will complete 1/4 steps of donning shirt OT Short Term Goal 2 (Week 2): Pt will wash 2/10 body parts OT Short Term Goal 3 (Week 2): Pt will initate oral care with min HOH A OT Short Term Goal 4 (Week 2): Pt will consistently initate sit to stand wiht no more than MIN A  Skilled Therapeutic Interventions/Progress Updates:    Pt received sitting in the TIS w/c at the nurses desk. Pt with no c/o pain and agreeable to OT session. Pt pleasantly confused throughout session with no agitation. Not oriented to anything but himself. He required frequent cueing for initiation, attention to task, motor planning ADL tasks, and command following. Pt completed UB bathing with poor initiation of the correct body part despite mod cueing. He was able to stand at the sink with only CGA but requiring frequent cueing to not use the RUE to hold onto sink. Pt more confused re LB bathing instructions overall and requiring max multimodal cueing to wash peri areas. He required max A to don pants, with improved initiation to pull up in standing. He was able to stand for several minutes while OT fastened brief and assisted with hygiene. Total A required for sling management and donning of gown d/t IV and NG running and difficult to manage. He completed oral care with a toothbrush, requiring min A and min questioning cues to initiate. He was left sitting up at the nurses desk with the waist restraints and B mitts on.   Therapy Documentation Precautions:  Precautions Precautions: Fall, Cervical Precaution Booklet Issued: No Precaution Comments: cortrak, bilateral soft mitts Required Braces or Orthoses: Sling, Cervical Brace Cervical Brace: At all  times, Hard collar Restrictions Weight Bearing Restrictions: Yes RUE Weight Bearing: Non weight bearing Other Position/Activity Restrictions: in sling, nonoperative mgmt per ortho 9/18    Agitated Behavior Scale: TBI Observation Details Observation Environment: pt room Start of observation period - Date: 01/31/22 Start of observation period - Time: 1410 End of observation period - Date: 01/31/22 End of observation period - Time: 1510 Agitated Behavior Scale (DO NOT LEAVE BLANKS) Short attention span, easy distractibility, inability to concentrate: Present to a moderate degree Impulsive, impatient, low tolerance for pain or frustration: Absent Uncooperative, resistant to care, demanding: Absent Violent and/or threatening violence toward people or property: Absent Explosive and/or unpredictable anger: Absent Rocking, rubbing, moaning, or other self-stimulating behavior: Absent Pulling at tubes, restraints, etc.: Present to a slight degree Wandering from treatment areas: Present to a slight degree Restlessness, pacing, excessive movement: Absent Repetitive behaviors, motor, and/or verbal: Absent Rapid, loud, or excessive talking: Absent Sudden changes of mood: Absent Easily initiated or excessive crying and/or laughter: Absent Self-abusiveness, physical and/or verbal: Absent Agitated behavior scale total score: 18   Therapy/Group: Individual Therapy  Curtis Sites 01/31/2022, 3:16 PM

## 2022-02-01 LAB — GLUCOSE, CAPILLARY
Glucose-Capillary: 101 mg/dL — ABNORMAL HIGH (ref 70–99)
Glucose-Capillary: 102 mg/dL — ABNORMAL HIGH (ref 70–99)
Glucose-Capillary: 106 mg/dL — ABNORMAL HIGH (ref 70–99)
Glucose-Capillary: 120 mg/dL — ABNORMAL HIGH (ref 70–99)
Glucose-Capillary: 93 mg/dL (ref 70–99)
Glucose-Capillary: 98 mg/dL (ref 70–99)

## 2022-02-01 MED ORDER — ENSURE ENLIVE PO LIQD
237.0000 mL | Freq: Two times a day (BID) | ORAL | Status: DC
  Administered 2022-02-01 – 2022-02-08 (×8): 237 mL via ORAL

## 2022-02-01 MED ORDER — CHLORHEXIDINE GLUCONATE CLOTH 2 % EX PADS
6.0000 | MEDICATED_PAD | CUTANEOUS | Status: DC
  Administered 2022-02-01 – 2022-03-07 (×68): 6 via TOPICAL

## 2022-02-01 MED ORDER — QUETIAPINE FUMARATE 25 MG PO TABS
12.5000 mg | ORAL_TABLET | Freq: Every day | ORAL | Status: DC
  Administered 2022-02-01: 12.5 mg via ORAL
  Filled 2022-02-01 (×2): qty 1

## 2022-02-01 MED ORDER — FREE WATER
200.0000 mL | Freq: Three times a day (TID) | Status: DC
  Administered 2022-02-01 – 2022-02-07 (×18): 200 mL

## 2022-02-01 MED ORDER — OSMOLITE 1.5 CAL PO LIQD: 420.0000 mL | ORAL | Status: DC

## 2022-02-01 MED ORDER — OSMOLITE 1.5 CAL PO LIQD
720.0000 mL | ORAL | Status: DC
  Administered 2022-02-01 – 2022-02-05 (×5): 720 mL
  Filled 2022-02-01: qty 948
  Filled 2022-02-01: qty 1000
  Filled 2022-02-01: qty 948
  Filled 2022-02-01: qty 1000

## 2022-02-01 NOTE — Progress Notes (Signed)
Patient ID: Dominic Smith, male   DOB: [birth year], 81 y.o.   MRN: [id number]  [id number] left message for pt son [name] with updates from team conference, and d/c date remains 10/26.   SW spoke with pt ex-wife [name] and his son [name] to provide updates from team conference, and pt requiring 24/7 care with goal of Mid Asst at d/c. SW encouraged family education next week and week of discharge to determine if progress has been made. Both feel that SNF short term rehab is appropriate. SW explained SNF placement process. SW discussed applying for MCD and LTC MCD. SW left SNF list ([email or web address]) and Medicaid application in packet and in room for them to review. SW will follow-up on preferred SNF locations.   Loralee Pacas, MSW, [place] Office: [phone] Cell: [phone] Fax: [phone]

## 2022-02-01 NOTE — Progress Notes (Signed)
Speech Language Pathology TBI Note  Patient Details  Name: Dominic Smith MRN: [id number] Date of Birth: 1940/10/14  Today's Date: 02/01/2022 SLP Individual Time: 1300-1355 SLP Individual Time Calculation (min): 55 min  Short Term Goals: Week 2: SLP Short Term Goal 1 (Week 2): Patient will consume trials of ice chips and initiate a swallow response in 100% of opportunities with Mod verbal cues to assess readiness for MBS. SLP Short Term Goal 2 (Week 2): Patient will demonstrate sustained attention to a functional task for 2 minutes with Max A multimodal cues for redirection. SLP Short Term Goal 3 (Week 2): Patient will orient to place, time and situation with Max A multimodal cues. SLP Short Term Goal 4 (Week 2): Patient will answer yes/no questions with 25% accuracy with Max A multimodal cues. SLP Short Term Goal 5 (Week 2): Patient will follow 1-step commands in 25% of opportunities with Max A multimodal cues.  Skilled Therapeutic Interventions: Skilled treatment session focused on cognitive and dysphagia goals. Upon arrival, patient was awake while upright in the tilt-in-space wheelchair. Patient agreeable to consuming his lunch meal but required Max verbal cues for attention to task due to verbosity. SLP would scoop the bolus but patient able to bring the spoon to his mouth with his LUE. Patient with decreased PO intake despite encouragement with intermittent throat clearing noted, however, SLP does not suspect this is related to PO intake. Recommend patient continue current diet. Patient with language of confusion and required total A for orientation to place and situation with multiple repetitions needed throughout session. Patient also perseverative on wiping his nose and was constantly reaching for items to use despite redirection. Patient left upright in wheelchair with alarm on, mitts in place and telesitter present. Continue with current plan of care.      Pain ABS discontinued d/t  ABS score less than 20 for the last three days or no behaviors present   Agitated Behavior Scale: TBI Observation Details Observation Environment: CIR Start of observation period - Date: 02/01/22 Start of observation period - Time: 1405 End of observation period - Date: 02/01/22 End of observation period - Time: 1435 Agitated Behavior Scale (DO NOT LEAVE BLANKS) Short attention span, easy distractibility, inability to concentrate: Present to a moderate degree Impulsive, impatient, low tolerance for pain or frustration: Absent Uncooperative, resistant to care, demanding: Absent Violent and/or threatening violence toward people or property: Absent Explosive and/or unpredictable anger: Absent Rocking, rubbing, moaning, or other self-stimulating behavior: Absent Pulling at tubes, restraints, etc.: Present to a slight degree Wandering from treatment areas: Present to a slight degree Restlessness, pacing, excessive movement: Present to a slight degree Repetitive behaviors, motor, and/or verbal: Present to a slight degree Rapid, loud, or excessive talking: Absent Sudden changes of mood: Absent Easily initiated or excessive crying and/or laughter: Absent Self-abusiveness, physical and/or verbal: Absent Agitated behavior scale total score: 20  Therapy/Group: Individual Therapy  Kyeshia Zinn 02/01/2022, 3:20 PM

## 2022-02-01 NOTE — Progress Notes (Addendum)
Physical Therapy TBI Note  Patient Details  Name: Dominic Smith MRN: [id number] Date of Birth: 1940-10-22  Today's Date: 02/01/2022 PT Individual Time: 4010-2725 PT Individual Time Calculation (min): 30 min   Short Term Goals: Week 2:  PT Short Term Goal 1 (Week 2): Patient will perform basic transfers with min A >50% of the time using LRAD. PT Short Term Goal 2 (Week 2): Patient will perform bed mobility with min A >50% of the time. PT Short Term Goal 3 (Week 2): Patient will ambulate >200 ft with min A using LRAD. PT Short Term Goal 4 (Week 2): Patient will initiate stair training.  Skilled Therapeutic Interventions/Progress Updates:     Patient in TIS w/c in the room upon PT arrival. Patient alert and agreeable to PT session. Patient denied pain during session.  Patient continues to be pleasantly confused with increased restlessness, intermittently reaching for his NG tube without pulling and easily redirectable. Patient only oriented to self, provided orientation during session.   Therapeutic Activity: Transfers: Patient performed sit to/from stand x3 with min A and increased time to initiate. Provided multimodal cues for forward weight shift and initiation to stand and sit.  Gait Training:  Patient ambulated >150 feet x2 with min A and L HHA with x1 L LOB requiring mod A. Patient able to indicate correct path finding to return to his room on return. Ambulated with decreased gait speed, decreased step length and height, narrow BOS, forward trunk lean, and downward head gaze. Provided verbal cues for erect posture and increased step length and width. Facilitated forward propulsion throughout for increased gait speed and step length.   Patient ascended/descended 8x6" steps using L rail with min A. Performed reciprocal gait pattern while ascending and step-to gait pattern while descending. Provided cues for technique and sequencing throughout.   Patient in [place] w/c handed off to nurse  secretary at [place] for 1-1 supervision due to increased restlessness at end of session with breaks locked, seat belt alarm set, B hand mitts donned, and all needs within reach.   Therapy Documentation Precautions:  Precautions Precautions: Fall, Cervical Precaution Booklet Issued: No Precaution Comments: cortrak, bilateral soft mitts Required Braces or Orthoses: Sling, Cervical Brace Cervical Brace: At all times, Hard collar Restrictions Weight Bearing Restrictions: Yes RUE Weight Bearing: Non weight bearing Other Position/Activity Restrictions: in sling, nonoperative mgmt per ortho 9/18 Agitated Behavior Scale: TBI Observation Details Observation Environment: CIR Start of observation period - Date: 02/01/22 Start of observation period - Time: 1405 End of observation period - Date: 02/01/22 End of observation period - Time: 1435 Agitated Behavior Scale (DO NOT LEAVE BLANKS) Short attention span, easy distractibility, inability to concentrate: Present to a moderate degree Impulsive, impatient, low tolerance for pain or frustration: Absent Uncooperative, resistant to care, demanding: Absent Violent and/or threatening violence toward people or property: Absent Explosive and/or unpredictable anger: Absent Rocking, rubbing, moaning, or other self-stimulating behavior: Absent Pulling at tubes, restraints, etc.: Present to a slight degree Wandering from treatment areas: Present to a slight degree Restlessness, pacing, excessive movement: Present to a slight degree Repetitive behaviors, motor, and/or verbal: Present to a slight degree Rapid, loud, or excessive talking: Absent Sudden changes of mood: Absent Easily initiated or excessive crying and/or laughter: Absent Self-abusiveness, physical and/or verbal: Absent Agitated behavior scale total score: 20    Therapy/Group: Individual Therapy  Tashanti Dalporto L Michah Minton PT, DPT, NCS, CBIS  02/01/2022, 2:57 PM

## 2022-02-01 NOTE — Progress Notes (Signed)
Occupational Therapy TBI Note  Patient Details  Name: Dominic Smith MRN: [id number] Date of Birth: 1941-03-31  Today's Date: 02/01/2022 OT Individual Time: 0800-0900 OT Individual Time Calculation (min): 60 min    Short Term Goals: Week 1:  OT Short Term Goal 1 (Week 1): Pt will consistently transfer to toilet with 1 caregiver OT Short Term Goal 1 - Progress (Week 1): Met OT Short Term Goal 2 (Week 1): Pt will complete 1/4 steps of donning shirt OT Short Term Goal 2 - Progress (Week 1): Progressing toward goal OT Short Term Goal 3 (Week 1): Pt will complete oral care with MOD A OT Short Term Goal 3 - Progress (Week 1): Progressing toward goal OT Short Term Goal 4 (Week 1): Pt will sit to stand with MOD A or less OT Short Term Goal 4 - Progress (Week 1): Met Week 2:  OT Short Term Goal 1 (Week 2): Pt will complete 1/4 steps of donning shirt OT Short Term Goal 2 (Week 2): Pt will wash 2/10 body parts OT Short Term Goal 3 (Week 2): Pt will initate oral care with min HOH A OT Short Term Goal 4 (Week 2): Pt will consistently initate sit to stand wiht no more than MIN A  Skilled Therapeutic Interventions/Progress Updates:     Pt received in bed with chin tucked into neck brace. Ot fixed. No pain reported. Increased confusion noted about not "the engineers are coming to fix the machine" with only orientation to self, but later in session oriented to hospital when MD in room.  ADL: Pt completes ADL at overall MAX A Level. Skilled interventions include: bed level LB ADLs d/t poor attention with total A. Improved initiation and attempt to advance pants past hips but pt unable ot motor plan just pushing brief down. Pt able to participate more with grooming and bathing UB at the sink with step by step cuing and poor ideomotor as demo by poor use of toothpaste attempting to put it on the faucet. Pt able to wash body parts with tactile cuing and mild perseveration on washing face. Sef feeding seated  in TIS with MOD A d/t non donimant have use for scooping confusing for pt. No overt s/s of aspiration eating cereal and milk.  Pt left at end of session in TIS with exit alarm on, call light in reach and all needs met   Therapy Documentation Precautions:  Precautions Precautions: Fall, Cervical Precaution Booklet Issued: No Precaution Comments: cortrak, bilateral soft mitts Required Braces or Orthoses: Sling, Cervical Brace Cervical Brace: At all times, Hard collar Restrictions Weight Bearing Restrictions: Yes RUE Weight Bearing: Non weight bearing Other Position/Activity Restrictions: in sling, nonoperative mgmt per ortho 9/18 General:    Agitated Behavior Scale: TBI ABS discontinued d/t ABS score less than 20 for the last three days or no behaviors present        Therapy/Group: Individual Therapy  [name] 02/01/2022, 6:53 AM

## 2022-02-01 NOTE — Progress Notes (Signed)
Nutrition Follow-up  DOCUMENTATION CODES:   Not applicable  INTERVENTION:   Tube Feeds via Cortrak: Osmolite 1.5 at 60 mL/hr x 12 hours  200 mL free water flush q8h  Provides 1800 kcal, 45 gm protein, and 1348 mL free water daily.  Continue Nutrisource Fiber per tube Ensure Enlive po BID, each supplement provides 350 kcal and 20 grams of protein.  NUTRITION DIAGNOSIS:   Altered GI function related to diarrhea as evidenced by other (comment) (multiple loose BMs per RN). - Progressing   GOAL:   Patient will meet greater than or equal to 90% of their needs - Being addressed via TF  MONITOR:   TF tolerance  REASON FOR ASSESSMENT:   Consult Enteral/tube feeding initiation and management, Other (Comment) (loose stools)  ASSESSMENT:   81 y.o. 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 PMH reviewed.  10/09 - diet advanced to Dysphagia 2, thin liquids  Pt up in wheelchair at time of visit. RN administering medications. RD observed pt breakfast tray <10% was consumed (few bites of cereal). RN informed RD that TFs have been discontinued and an order in to remove tube. RD reached out to MD to recommend keeping Cortrak in lpace and continuing with TF until pt consistently is  able to take in >60% of estimated needs via PO. MD agreeable to nocturnal feeds.   RD asked pt if he wanted something to drink and he told RD no, RD asked again a few moments later and he again said no.  Meal Documentation (10/9-10/10): 5-40% x 3 meals (average 25%)  Admission Weight: 69.6 kg Current Weight: 64.4 kg   Medications reviewed and include: B complex w/ Vitamin C, Ferrous Sulfate, Nutrisource Fiber, IV antibiotics  Labs reviewed: 24 hr CBG 87-118  UOP: 2500 mL x 24 hours   Diet Order:   Diet Order             DIET DYS 2 Room service appropriate? Yes; Fluid consistency: Thin  Diet effective now                   EDUCATION NEEDS:    Not appropriate for education at this time  Skin:  Skin Assessment: Skin Integrity Issues: Skin Integrity Issues:: Unstageable Unstageable: Unstageable wound to coccyx  Last BM:  10/7  Height:   Ht Readings from Last 1 Encounters:  01/19/22 5\' 11"  (1.803 m)    Weight:   Wt Readings from Last 1 Encounters:  02/01/22 64.4 kg    Ideal Body Weight:  78.2 kg  BMI:  Body mass index is 19.8 kg/m.  Estimated Nutritional Needs:   Kcal:  1800-2000  Protein:  90-105 grams  Fluid:  >/= 1.8 L   Hermina Barters RD, LDN Clinical Dietitian See [facility] for contact information.

## 2022-02-01 NOTE — Patient Care Conference (Signed)
Inpatient RehabilitationTeam Conference and Plan of Care Update Date: 02/01/2022   Time: 10:03 AM    Patient Name: Dominic Smith      Medical Record Number: [id number]  Date of Birth: 09-20-1940 Sex: Male         Room/Bed: 4W10C/4W10C-01 Payor Info: Payor: MEDICARE / Plan: MEDICARE PART A / Product Type: *No Product type* /    Admit Date/Time:  01/19/2022  2:31 PM  Primary Diagnosis:  TBI (traumatic brain injury) [facility])  Hospital Problems: Principal Problem:   TBI (traumatic brain injury) ([place]) Active Problems:   HCAP (healthcare-associated pneumonia)   Azotemia   Urinary retention   Hypervolemia    Expected Discharge Date: Expected Discharge Date: 02/17/22  Team Members Present: Physician leading conference: Dr. Alger Simons Social Worker Present: Loralee Pacas, [place] Nurse Present: Other (comment) Tacy Learn, RN) PT Present: Apolinar Junes, PT OT Present: Mariane Masters, OT SLP Present: Weston Anna, SLP PPS Coordinator present : Gunnar Fusi, SLP     Current Status/Progress Goal Weekly Team Focus  Bowel/Bladder   Incontinent of Bowel. Foley catheter in place.  regain bowel cont.  assess q shift and PRN   Swallow/Nutrition/ Hydration   Dys. 3 textures with thin liquids, Mod A  Min A  Tolerance of current diet, use of swallowing compenstory strategies   ADL's   RUE NWB, MIN A SPTm C collar at all times, MAX A for BADLs wiht improved command following, remains confused at rancho 5  MIN A-likely downgrade to MOD A  ADL training, orientation, family education/DC planning, funcitonal transfers, cogntion   Mobility   min-mod A overall, gait >200 ft with HHA, improved arousal with intermittent lethargy, reduced motor planning with improved initiation this week.  Min A overall  Activity tolerance, functional mobility, motor planning, initiattion, orientation, gait and stair training, balance, stimulation tolerance, behavior management, patient/caregiver  education   Communication   Supervision  Supervision  Goal Met   Safety/Cognition/ Behavioral Observations  Rancho Level V: Max A  Min-Mod A  initiatin, sustained attention, orientation   Pain   face pain 4of 10,  general  pain<3  assess pain q shift and PRN   Skin   unstagable to buttoks and MASD  promote healing and prevention of infection  assess q shift and PRN     Discharge Planning:  D/c location pending at htis time. Pt son only able to provide PRN support, and ex-wife [name] reports he is unable to stay with her. States he was previously living in a room he rented. SW will confirm there are no barriers to discharge.   Team Discussion: TBI. Incontinent of bowel, foley to remain at discharge. Pain managed with PRNs. Unstagable to coccyx healing. RUE NWB.  Following commands and inattention has improved with medication adjustments. Intermittent confusion.  Patient on target to meet rehab goals: yes, ambulating 350 ft HH. Ste max assist. Diet upgraded to D2/thin  *See Care Plan and progress notes for long and short-term goals.   Revisions to Treatment Plan:  Medication adjustments, monitor labs Teaching Needs: Medications, safety, skin/wound care, gait/transfer training, etc.  Current Barriers to Discharge: Home enviroment access/layout, Incontinence, Wound care, Lack of/limited family support, Insurance for SNF coverage, Weight bearing restrictions, and Behavior  Possible Resolutions to Barriers: Family education, 24/7 care, order recommended DME     Medical Summary Current Status: improved arousal. still confused. passed MBS for diet. urine retention, foley replaced  Barriers to Discharge: Medical stability   Possible Resolutions to [employer]  Focus: remove cortrak. push po, daily assessment of labs and pt data   Continued Need for Acute Rehabilitation Level of Care: The patient requires daily medical management by a physician with specialized training in physical  medicine and rehabilitation for the following reasons: Direction of a multidisciplinary physical rehabilitation program to maximize functional independence : Yes Medical management of patient stability for increased activity during participation in an intensive rehabilitation regime.: Yes Analysis of laboratory values and/or radiology reports with any subsequent need for medication adjustment and/or medical intervention. : Yes   I attest that I was present, lead the team conference, and concur with the assessment and plan of the team.   Ernest Pine 02/01/2022, 1:46 PM

## 2022-02-01 NOTE — Progress Notes (Signed)
Patient slept on and off during this tour.

## 2022-02-01 NOTE — Progress Notes (Signed)
Physical Therapy TBI Note  Patient Details  Name: Dominic Smith MRN: [id number] Date of Birth: [birth year]  Today's Date: 02/01/2022 PT Individual Time: 1015-1100 PT Individual Time Calculation (min): 45 min   Short Term Goals: Week 2:  PT Short Term Goal 1 (Week 2): Patient will perform basic transfers with min A >50% of the time using LRAD. PT Short Term Goal 2 (Week 2): Patient will perform bed mobility with min A >50% of the time. PT Short Term Goal 3 (Week 2): Patient will ambulate >200 ft with min A using LRAD. PT Short Term Goal 4 (Week 2): Patient will initiate stair training.  Skilled Therapeutic Interventions/Progress Updates:    Pt received at nurses station seated in [facility] chair with ted hose and mittens in place. No complaint of pain. Doffed mittens for session and returned at end of session. Pt demoes improving command following from previous session with this therapist. Pt was able to attend to conversation about his hobbies and work life during seated rest break, with some confusion at times and perseverating on topics even as therapist changed the subject.  Pt ambulated >400 ft and ~300 ft with HHA and CGA-min A for balance. Cues for cueing and navigation. Pt demoes preference for R side, even bumping into objects on R side. Pt with variable cadence that worsens with fatigue. Some bouts of near normalized gait pattern intermittently with short step length and low clearance, and variable BOS width. Pt returned to room and remained in tilted TIS after session per RN for medications.   Therapy Documentation Precautions:  Precautions Precautions: Fall, Cervical Precaution Booklet Issued: No Precaution Comments: cortrak, bilateral soft mitts Required Braces or Orthoses: Sling, Cervical Brace Cervical Brace: At all times, Hard collar Restrictions Weight Bearing Restrictions: Yes RUE Weight Bearing: Non weight bearing Other Position/Activity Restrictions: in sling,  nonoperative mgmt per ortho 9/18 General:      Agitated Behavior Scale: TBI Observation Details Observation Environment: CIR Start of observation period - Date: 02/01/22 Start of observation period - Time: 1015 End of observation period - Date: 02/01/22 End of observation period - Time: 1100 Agitated Behavior Scale (DO NOT LEAVE BLANKS) Short attention span, easy distractibility, inability to concentrate: Present to a moderate degree Impulsive, impatient, low tolerance for pain or frustration: Absent Uncooperative, resistant to care, demanding: Absent Violent and/or threatening violence toward people or property: Absent Explosive and/or unpredictable anger: Absent Rocking, rubbing, moaning, or other self-stimulating behavior: Absent Pulling at tubes, restraints, etc.: Present to a slight degree Wandering from treatment areas: Present to a slight degree Restlessness, pacing, excessive movement: Absent Repetitive behaviors, motor, and/or verbal: Absent Rapid, loud, or excessive talking: Absent Sudden changes of mood: Absent Easily initiated or excessive crying and/or laughter: Absent Self-abusiveness, physical and/or verbal: Absent Agitated behavior scale total score: 18      Therapy/Group: Individual Therapy  Mickel Fuchs 02/01/2022, 12:14 PM

## 2022-02-01 NOTE — Progress Notes (Signed)
PROGRESS NOTE   Subjective/Complaints:  Pt up at sink with OT. No complaints. Knew he was in hospital. Denies pain. Ate some of breakfast.   ROS: Limited due to cognitive/behavioral    Objective:   DG Swallowing Func-Speech Pathology  Result Date: 01/31/2022 Table formatting from the original result was not included. Images from the original result were not included. Objective Swallowing Evaluation: Type of Study: MBS-Modified Barium Swallow Study  Patient Details Name: Dominic Smith MRN: [id number] Date of Birth: 08-08-1940 Today's Date: 01/31/2022 Past Medical History: No past medical history on file. Past Surgical History: No past surgical history on file. HPI: See H&P  Subjective: Pt resting in bed, C-collar in place. Son arrived at end of session  Recommendations for follow up therapy are one component of a multi-disciplinary discharge planning process, led by the attending physician.  Recommendations may be updated based on patient status, additional functional criteria and insurance authorization. Assessment / Plan / Recommendation   01/31/2022   3:46 PM Clinical Impressions Clinical Impression Patient demonstrates mild oropharyngeal dysphagia which is impacted by his overall cognitive functioning. Patient demonstrates lingual pumping/rolling, decreased bolus cohesion, impaired mastication, premature spillage with both solids and liquids and inconsistent timing of the swallow trigger. This results in mild lingual residue with sold textures that intermittently pool in the valleculae and spills over the back of the epiglottis resulting in 1 episode of trace sensed penetration. No aspiration events observed. Recommend patient initiate a diet of dys. 2 textures with thin liquids with full supervision to assist in feeding and utilization of swallowing compensatory strategies. SLP Visit Diagnosis Dysphagia, oropharyngeal phase (R13.12) Impact on  safety and function Mild aspiration risk;Moderate aspiration risk     01/11/2022  10:16 AM Treatment Recommendations Treatment Recommendations Therapy as outlined in treatment plan below     01/31/2022   3:54 PM Prognosis Prognosis for Safe Diet Advancement Good Barriers to Reach Goals Cognitive deficits   01/31/2022   3:46 PM Diet Recommendations SLP Diet Recommendations Dysphagia 2 (Fine chop) solids;Thin liquid Liquid Administration via Straw Medication Administration Crushed with puree Compensations Minimize environmental distractions;Slow rate;Small sips/bites Postural Changes Seated upright at 90 degrees     01/31/2022   3:46 PM Other Recommendations Oral Care Recommendations Oral care BID   01/11/2022  10:16 AM Frequency and Duration  Speech Therapy Frequency (ACUTE ONLY) min 1 x/week Treatment Duration 2 weeks     01/31/2022   3:38 PM Oral Phase Oral Phase Impaired Oral - Thin Teaspoon Delayed oral transit;Decreased bolus cohesion;Premature spillage;Lingual pumping Oral - Thin Cup Premature spillage;Delayed oral transit;Decreased bolus cohesion;Lingual pumping Oral - Thin Straw Lingual pumping;Premature spillage;Delayed oral transit;Decreased bolus cohesion Oral - Puree Decreased bolus cohesion;Delayed oral transit;Premature spillage;Lingual pumping Oral - Mech Soft Premature spillage;Delayed oral transit;Decreased bolus cohesion;Lingual pumping;Impaired mastication Oral - Regular Lingual pumping;Premature spillage;Delayed oral transit;Decreased bolus cohesion;Impaired mastication    01/31/2022   3:39 PM Pharyngeal Phase Pharyngeal Phase Impaired Pharyngeal- Thin Teaspoon Delayed swallow initiation-vallecula Pharyngeal- Thin Cup Delayed swallow initiation-vallecula Pharyngeal- Thin Straw WFL Pharyngeal Material does not enter airway Pharyngeal- Puree WFL Pharyngeal- Mechanical Soft Delayed swallow initiation-pyriform sinuses;Penetration/Apiration after swallow Pharyngeal Material enters airway, remains ABOVE vocal  cords  and not ejected out Pharyngeal- Regular NT     No data to display    PAYNE, COURTNEY 01/31/2022, 3:57 PM    Weston Anna, MA, CCC-SLP                   Recent Labs    01/31/22 0610  WBC 7.7  HGB 7.8*  HCT 24.0*  PLT 347    Recent Labs    01/31/22 0610  NA 140  K 4.0  CL 109  CO2 25  GLUCOSE 103*  BUN 27*  CREATININE 0.84  CALCIUM 8.2*     Intake/Output Summary (Last 24 hours) at 02/01/2022 0846 Last data filed at 02/01/2022 0827 Gross per 24 hour  Intake 1337 ml  Output 1400 ml  Net -63 ml     Pressure Injury 01/13/22 Coccyx Mid Unstageable - Full thickness tissue loss in which the base of the injury is covered by slough (yellow, tan, gray, green or brown) and/or eschar (tan, brown or black) in the wound bed. skin is broken with redness at the (Active)  01/13/22 1930  Location: Coccyx  Location Orientation: Mid  Staging: Unstageable - Full thickness tissue loss in which the base of the injury is covered by slough (yellow, tan, gray, green or brown) and/or eschar (tan, brown or black) in the wound bed.  Wound Description (Comments): skin is broken with redness at the base  Present on Admission: Yes (present on admission to rehab 9/27)    Physical Exam: Vital Signs Blood pressure 118/62, pulse 96, temperature 97.9 F (36.6 C), resp. rate 14, height 5\' 11"  (1.803 m), weight 64.4 kg, SpO2 100 %.  Constitutional: No distress . Vital signs reviewed. HEENT: NCAT, EOMI, oral membranes moist Neck: supple Cardiovascular: RRR without murmur. No JVD    Respiratory/Chest: CTA Bilaterally without wheezes or rales. Normal effort    GI/Abdomen: BS +, non-tender, non-distended Ext: no clubbing, cyanosis, or edema Psych: pleasant and cooperative, still confused Skin: wounds at knees, both hands, left elbow, right ankle. Scrotum excoriated. Sacral wound is still present. Foam dressings at based of collar Neuro: alert. Oriented to self and hospital. Remembered my name after  a few seconds. Speech loud and clear. LOC.  Processing commands from OT more appropriately today.   Moves all 4 limbs spontaneously but difficut to grade. Good sitting balance Musculoskeletal: no focal jt pain, nl prom.     Assessment/Plan: 1. Functional deficits which require 3+ hours per day of interdisciplinary therapy in a comprehensive inpatient rehab setting. Physiatrist is providing close team supervision and 24 hour management of active medical problems listed below. Physiatrist and rehab team continue to assess barriers to discharge/monitor patient progress toward functional and medical goals  Care Tool:  Bathing        Body parts bathed by helper: Right arm, Left arm, Chest, Abdomen, Front perineal area, Buttocks, Right upper leg, Left upper leg, Right lower leg, Left lower leg, Face     Bathing assist Assist Level: 2 Helpers     Upper Body Dressing/Undressing Upper body dressing   What is the patient wearing?: Pull over shirt    Upper body assist Assist Level: Dependent - Patient 0%    Lower Body Dressing/Undressing Lower body dressing            Lower body assist Assist for lower body dressing: 2 Helpers     Toileting Toileting    Toileting assist Assist for toileting: 2 Helpers     Transfers Chair/bed  transfer  Transfers assist     Chair/bed transfer assist level: Moderate Assistance - Patient 50 - 74%     Locomotion Ambulation   Ambulation assist   Ambulation activity did not occur: Safety/medical concerns  Assist level: Moderate Assistance - Patient 50 - 74% Assistive device: Other (comment) (IV pole on L) Max distance: 200 ft   Walk 10 feet activity   Assist  Walk 10 feet activity did not occur: Safety/medical concerns  Assist level: Moderate Assistance - Patient - 50 - 74% Assistive device: No Device   Walk 50 feet activity   Assist Walk 50 feet with 2 turns activity did not occur: Safety/medical concerns  Assist level:  Moderate Assistance - Patient - 50 - 74%      Walk 150 feet activity   Assist Walk 150 feet activity did not occur: Safety/medical concerns  Assist level: Moderate Assistance - Patient - 50 - 74%      Walk 10 feet on uneven surface  activity   Assist Walk 10 feet on uneven surfaces activity did not occur: Safety/medical concerns         Wheelchair     Assist Is the patient using a wheelchair?: Yes Type of Wheelchair: Manual    Wheelchair assist level: Dependent - Patient 0% (TIS)      Wheelchair 50 feet with 2 turns activity    Assist    Wheelchair 50 feet with 2 turns activity did not occur: Safety/medical concerns       Wheelchair 150 feet activity     Assist  Wheelchair 150 feet activity did not occur: Safety/medical concerns       Blood pressure 118/62, pulse 96, temperature 97.9 F (36.6 C), resp. rate 14, height 5\' 11"  (1.803 m), weight 64.4 kg, SpO2 100 %.  Medical Problem List and Plan: 1. 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 Interdisciplinary team conference today to discuss goals, barriers to discharge, and dc planning.                - may be dispo issues. Family is working on them   - I spoke with son at length Friday about his condition, prognosis.  2.  Antithrombotics: -DVT/anticoagulation:  Pharmaceutical: Lovenox 30 mg BID             -antiplatelet therapy: aspirin 81 mg daily 3. Pain Management: Tylenol per tube scheduled,\; oxycodone, Robaxin prn 4. 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 Neuropsych/cognition:  This patient is not capable of making decisions on his own behalf.  -telesitter, soft waist belt, mittens for safety            - Seroquel 25 mg scheduled qhs and qhs PRN  -continue sleep chart   6. Skin/Wound Care: routine skin care checks             -monitor scalp lac/skin abrasions             -  appreciate WOC recs for unstageable sacral/coccyx pressure injury   -medihoney/foam dressing 7. Fluids/Electrolytes/Nutrition/dysphagia: Strict Is and Os and follow-up chemistries             -MBS yesterday--diet initiated D2/thins  -dc cortrak, push po  -IVF if needed to maintain hydration  -check BMET on thursday 8: Right distal clavicle and humerus fracture:  -continue sling; follow-up with Dr. Marcelino Scot on Monday -NWB RUE, pendulum activiites  9: Right 1 st rib fracture: pneumothorax resolved; IS/FV 10: C5-C6, C4 and C7 transverse process fractures:  -continue cervical collar; follow-up with Dr Annette Stable 11: Possible right VA and ICA injuries: daily aspirin 13: Urinary retention: has indwelling Foley catheter             -Cardura started 9/25--stopped d/t hypotension  10/6 resumed cardura     10/8 -Restarted foley cath, has urinary retention and bleeding/irritation with IC  10/10 will need cath at discharge 15: SAH:  completed 7 days of Keppra; follow-up with neurosurgery 16: Volume overload/peripheral edema: received lasix 40mg  x 1 last week             -strict Is and Os and daily weights             -weights are stable Filed Weights   01/30/22 0500 01/31/22 0500 02/01/22 0428  Weight: 63.5 kg 66.2 kg 64.4 kg    -10/10 weights inconsistent 17: AKI: see above 18: Elevated transaminases: trending downward; follow-up CMP tomorrow 19:  ?aspiration pneumonia w/x hx of staph and pseudomonas - vancomycin and cefepime total of 7 more days ended 10/5 -continue robitussin             10/9 wbcs 7.7 20: SVT/sinus tach intermittently: monitor 21: Mediastinal hematoma: Echo WNL; Hgb stable 22:  Ankle edema and ecchymosis: x-rays negative for osseous abnormality           Continue TEDS, elevation in bed 23. Loose stools. No s/s infectious diarrhea. Likely d/t abx and Tfs. Getting lomotil PRN.    -expect further improvement off TF (in addition to abx)  -10/8 Last BM   24. Normocytic anemia/ABLA: likely related to hospitalization and chronic disease  -no gross blood loss  -added b complex and iron  -continue to follow  -10/9 hgb 7.8   LOS: 13 days A FACE TO [place] 02/01/2022, 8:46 AM

## 2022-02-02 DIAGNOSIS — Z7189 Other specified counseling: Secondary | ICD-10-CM

## 2022-02-02 LAB — GLUCOSE, CAPILLARY
Glucose-Capillary: 71 mg/dL (ref 70–99)
Glucose-Capillary: 75 mg/dL (ref 70–99)
Glucose-Capillary: 81 mg/dL (ref 70–99)
Glucose-Capillary: 85 mg/dL (ref 70–99)
Glucose-Capillary: 95 mg/dL (ref 70–99)

## 2022-02-02 MED ORDER — QUETIAPINE FUMARATE 25 MG PO TABS
25.0000 mg | ORAL_TABLET | Freq: Every day | ORAL | Status: DC
  Administered 2022-02-02 – 2022-02-06 (×5): 25 mg via ORAL
  Filled 2022-02-02 (×3): qty 1

## 2022-02-02 MED ORDER — LORAZEPAM 2 MG/ML IJ SOLN
0.5000 mg | INTRAMUSCULAR | Status: AC | PRN
  Administered 2022-02-02 – 2022-02-06 (×2): 0.5 mg via INTRAVENOUS
  Filled 2022-02-02 (×2): qty 1

## 2022-02-02 NOTE — Progress Notes (Signed)
Pt gown wet and had to be changed. Pt agitated and screaming help and swinging and kicking.  Not redirectable at this time. Has soft belt on and mittens. Pt pulling at foley and cortrack in nose. Notified eunice and new orders placed. Cont to monitor

## 2022-02-02 NOTE — Progress Notes (Signed)
Speech Language Pathology TBI Note  Patient Details  Name: Dominic Smith MRN: [id number] Date of Birth: 1940/12/06  Today's Date: 02/02/2022 SLP Individual Time: 1751-0258 SLP Individual Time Calculation (min): 32 min Missed Time: 28 minutes, fatigue  Short Term Goals: Week 2: SLP Short Term Goal 1 (Week 2): Patient will consume trials of ice chips and initiate a swallow response in 100% of opportunities with Mod verbal cues to assess readiness for MBS. SLP Short Term Goal 2 (Week 2): Patient will demonstrate sustained attention to a functional task for 2 minutes with Max A multimodal cues for redirection. SLP Short Term Goal 3 (Week 2): Patient will orient to place, time and situation with Max A multimodal cues. SLP Short Term Goal 4 (Week 2): Patient will answer yes/no questions with 25% accuracy with Max A multimodal cues. SLP Short Term Goal 5 (Week 2): Patient will follow 1-step commands in 25% of opportunities with Max A multimodal cues.  Skilled Therapeutic Interventions: Skilled treatment session focused on cognitive goals. Upon arrival, patient was awake but appeared lethargic in bed. Patient repositioned to maximize arousal. SLP facilitated session by providing total A for orientation to place, time and situation despite Max cues. SLP attempted to utilize external aids with patient unable to locate or read despite holding the sign in his LUE. SLP also provided hand over hand assist for patient to initiate oral care via the suction toothbrush due to decreased attention. Patient unable to complete and would completely drop the toothbrush, therefore, SLP completed for thoroughness. Patient with confabulation, language of confusion and appeared to see things as he was constantly reaching out into the air above his head attempting to grab at things that were not present. Patient became lethargic and unable to maintain arousal long enough to participate in treatment session efficiently,  therefore, session ended early. Patient left upright in bed with mitts in place and all needs within reach. Continue with current plan of care.      Pain No/Denies Pain   Agitated Behavior Scale: TBI Observation Details Observation Environment: CIR Start of observation period - Date: 02/02/22 Start of observation period - Time: 0908 End of observation period - Date: 02/02/22 End of observation period - Time: 0940 Agitated Behavior Scale (DO NOT LEAVE BLANKS) Short attention span, easy distractibility, inability to concentrate: Present to a moderate degree Impulsive, impatient, low tolerance for pain or frustration: Absent Uncooperative, resistant to care, demanding: Absent Violent and/or threatening violence toward people or property: Absent Explosive and/or unpredictable anger: Absent Rocking, rubbing, moaning, or other self-stimulating behavior: Absent Pulling at tubes, restraints, etc.: Absent Wandering from treatment areas: Absent Restlessness, pacing, excessive movement: Absent Repetitive behaviors, motor, and/or verbal: Absent Rapid, loud, or excessive talking: Absent Sudden changes of mood: Absent Easily initiated or excessive crying and/or laughter: Absent Self-abusiveness, physical and/or verbal: Absent Agitated behavior scale total score: 16  Therapy/Group: Individual Therapy  Johniece Hornbaker 02/02/2022, 12:56 PM

## 2022-02-02 NOTE — Progress Notes (Signed)
PROGRESS NOTE   Subjective/Complaints:  Pt became agitated last night. TF became disconnected. Received ativan and now lethargic this morning  ROS: Limited due to cognitive/behavioral    Objective:   DG Swallowing Func-Speech Pathology  Result Date: 01/31/2022 Table formatting from the original result was not included. Images from the original result were not included. Objective Swallowing Evaluation: Type of Study: MBS-Modified Barium Swallow Study  Patient Details Name: Dominic Smith MRN: [id number] Date of Birth: 01-Aug-1940 Today's Date: 01/31/2022 Past Medical History: No past medical history on file. Past Surgical History: No past surgical history on file. HPI: See H&P  Subjective: Pt resting in bed, C-collar in place. Son arrived at end of session  Recommendations for follow up therapy are one component of a multi-disciplinary discharge planning process, led by the attending physician.  Recommendations may be updated based on patient status, additional functional criteria and insurance authorization. Assessment / Plan / Recommendation   01/31/2022   3:46 PM Clinical Impressions Clinical Impression Patient demonstrates mild oropharyngeal dysphagia which is impacted by his overall cognitive functioning. Patient demonstrates lingual pumping/rolling, decreased bolus cohesion, impaired mastication, premature spillage with both solids and liquids and inconsistent timing of the swallow trigger. This results in mild lingual residue with sold textures that intermittently pool in the valleculae and spills over the back of the epiglottis resulting in 1 episode of trace sensed penetration. No aspiration events observed. Recommend patient initiate a diet of dys. 2 textures with thin liquids with full supervision to assist in feeding and utilization of swallowing compensatory strategies. SLP Visit Diagnosis Dysphagia, oropharyngeal phase (R13.12) Impact on  safety and function Mild aspiration risk;Moderate aspiration risk     01/11/2022  10:16 AM Treatment Recommendations Treatment Recommendations Therapy as outlined in treatment plan below     01/31/2022   3:54 PM Prognosis Prognosis for Safe Diet Advancement Good Barriers to Reach Goals Cognitive deficits   01/31/2022   3:46 PM Diet Recommendations SLP Diet Recommendations Dysphagia 2 (Fine chop) solids;Thin liquid Liquid Administration via Straw Medication Administration Crushed with puree Compensations Minimize environmental distractions;Slow rate;Small sips/bites Postural Changes Seated upright at 90 degrees     01/31/2022   3:46 PM Other Recommendations Oral Care Recommendations Oral care BID   01/11/2022  10:16 AM Frequency and Duration  Speech Therapy Frequency (ACUTE ONLY) min 1 x/week Treatment Duration 2 weeks     01/31/2022   3:38 PM Oral Phase Oral Phase Impaired Oral - Thin Teaspoon Delayed oral transit;Decreased bolus cohesion;Premature spillage;Lingual pumping Oral - Thin Cup Premature spillage;Delayed oral transit;Decreased bolus cohesion;Lingual pumping Oral - Thin Straw Lingual pumping;Premature spillage;Delayed oral transit;Decreased bolus cohesion Oral - Puree Decreased bolus cohesion;Delayed oral transit;Premature spillage;Lingual pumping Oral - Mech Soft Premature spillage;Delayed oral transit;Decreased bolus cohesion;Lingual pumping;Impaired mastication Oral - Regular Lingual pumping;Premature spillage;Delayed oral transit;Decreased bolus cohesion;Impaired mastication    01/31/2022   3:39 PM Pharyngeal Phase Pharyngeal Phase Impaired Pharyngeal- Thin Teaspoon Delayed swallow initiation-vallecula Pharyngeal- Thin Cup Delayed swallow initiation-vallecula Pharyngeal- Thin Straw WFL Pharyngeal Material does not enter airway Pharyngeal- Puree WFL Pharyngeal- Mechanical Soft Delayed swallow initiation-pyriform sinuses;Penetration/Apiration after swallow Pharyngeal Material enters airway, remains ABOVE vocal  cords and not ejected out Pharyngeal-  Regular NT     No data to display    PAYNE, [place] 01/31/2022, 3:57 PM    Weston Anna, MA, CCC-SLP                   Recent Labs    01/31/22 0610  WBC 7.7  HGB 7.8*  HCT 24.0*  PLT 347    Recent Labs    01/31/22 0610  NA 140  K 4.0  CL 109  CO2 25  GLUCOSE 103*  BUN 27*  CREATININE 0.84  CALCIUM 8.2*     Intake/Output Summary (Last 24 hours) at 02/02/2022 0803 Last data filed at 02/02/2022 [id number] Gross per 24 hour  Intake 7600.31 ml  Output 2550 ml  Net 5050.31 ml     Pressure Injury 01/13/22 Coccyx Mid Unstageable - Full thickness tissue loss in which the base of the injury is covered by slough (yellow, tan, gray, green or brown) and/or eschar (tan, brown or black) in the wound bed. skin is broken with redness at the (Active)  01/13/22 1930  Location: Coccyx  Location Orientation: Mid  Staging: Unstageable - Full thickness tissue loss in which the base of the injury is covered by slough (yellow, tan, gray, green or brown) and/or eschar (tan, brown or black) in the wound bed.  Wound Description (Comments): skin is broken with redness at the base  Present on Admission: Yes (present on admission to rehab 9/27)    Physical Exam: Vital Signs Blood pressure 131/73, pulse 100, temperature 98 F (36.7 C), temperature source Oral, resp. rate 18, height 5\' 11"  (1.803 m), weight 64.4 kg, SpO2 100 %.  Constitutional: No distress . Vital signs reviewed. HEENT: NGT Neck: supple Cardiovascular: RRR without murmur. No JVD    Respiratory/Chest: CTA Bilaterally without wheezes or rales. Normal effort    GI/Abdomen: BS +, non-tender, non-distended Ext: no clubbing, cyanosis, or edema Psych: pleasant and cooperative  Skin: wounds at knees, both hands, left elbow, right ankle. Scrotum excoriated. Sacral wound is still present. Foam dressings at based of collar Neuro: lethargic.  Processing commands from OT more appropriately today.   Moves  all 4 limbs spontaneously but difficut to grade.   Musculoskeletal: no focal jt pain, nl prom.     Assessment/Plan: 1. Functional deficits which require 3+ hours per day of interdisciplinary therapy in a comprehensive inpatient rehab setting. Physiatrist is providing close team supervision and 24 hour management of active medical problems listed below. Physiatrist and rehab team continue to assess barriers to discharge/monitor patient progress toward functional and medical goals  Care Tool:  Bathing        Body parts bathed by helper: Right arm, Left arm, Chest, Abdomen, Front perineal area, Buttocks, Right upper leg, Left upper leg, Right lower leg, Left lower leg, Face     Bathing assist Assist Level: 2 Helpers     Upper Body Dressing/Undressing Upper body dressing   What is the patient wearing?: Pull over shirt    Upper body assist Assist Level: Dependent - Patient 0%    Lower Body Dressing/Undressing Lower body dressing            Lower body assist Assist for lower body dressing: 2 Helpers     Toileting Toileting    Toileting assist Assist for toileting: 2 Helpers     Transfers Chair/bed transfer  Transfers assist     Chair/bed transfer assist level: Moderate Assistance - Patient 50 - 74%     Locomotion Ambulation  Ambulation assist   Ambulation activity did not occur: Safety/medical concerns  Assist level: Moderate Assistance - Patient 50 - 74% Assistive device: Other (comment) (IV pole on L) Max distance: 200 ft   Walk 10 feet activity   Assist  Walk 10 feet activity did not occur: Safety/medical concerns  Assist level: Moderate Assistance - Patient - 50 - 74% Assistive device: No Device   Walk 50 feet activity   Assist Walk 50 feet with 2 turns activity did not occur: Safety/medical concerns  Assist level: Moderate Assistance - Patient - 50 - 74%      Walk 150 feet activity   Assist Walk 150 feet activity did not occur:  Safety/medical concerns  Assist level: Moderate Assistance - Patient - 50 - 74%      Walk 10 feet on uneven surface  activity   Assist Walk 10 feet on uneven surfaces activity did not occur: Safety/medical concerns         Wheelchair     Assist Is the patient using a wheelchair?: Yes Type of Wheelchair: Manual    Wheelchair assist level: Dependent - Patient 0% (TIS)      Wheelchair 50 feet with 2 turns activity    Assist    Wheelchair 50 feet with 2 turns activity did not occur: Safety/medical concerns       Wheelchair 150 feet activity     Assist  Wheelchair 150 feet activity did not occur: Safety/medical concerns       Blood pressure 131/73, pulse 100, temperature 98 F (36.7 C), temperature source Oral, resp. rate 18, height 5\' 11"  (1.803 m), weight 64.4 kg, SpO2 100 %.  Medical Problem List and Plan: 1. 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  Antithrombotics: -DVT/anticoagulation:  Pharmaceutical: Lovenox 30 mg BID             -antiplatelet therapy: aspirin 81 mg daily 3. Pain Management: Tylenol per tube scheduled,\; oxycodone, Robaxin prn 4. Mood/Behavior/Sleep: LCSW to evaluate and provide emotional support             -antipsychotic agents: reduced seroquel per family request. Will increase back to 25mg  which was working well for him.              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 Neuropsych/cognition: This patient is not capable of making decisions on his own behalf.  -telesitter, soft waist belt, mittens for safety            - Seroquel 25 mg scheduled qhs and qhs PRN  -continue sleep chart   6. Skin/Wound Care: routine skin care checks             -monitor  scalp lac/skin abrasions             -appreciate WOC recs for unstageable sacral/coccyx pressure injury   -medihoney/foam dressing 7. Fluids/Electrolytes/Nutrition/dysphagia: Strict Is and Os and follow-up chemistries             -MBS yesterday--diet initiated D2/thins  -dc cortrak, push po  -IVF if needed to maintain hydration  -check BMET on thursday 8: Right distal clavicle  and humerus fracture:  -continue sling; follow-up with Dr. Marcelino Scot on Monday -NWB RUE, pendulum activiites  9: Right 1 st rib fracture: pneumothorax resolved; IS/FV 10: C5-C6, C4 and C7 transverse process fractures:  -continue cervical collar; follow-up with Dr Annette Stable 11: Possible right VA and ICA injuries: daily aspirin 13: Urinary retention: has indwelling Foley catheter             -Cardura started 9/25--stopped d/t hypotension  10/6 resumed cardura     10/8 -Restarted foley cath, has urinary retention and bleeding/irritation with IC  10/10 will need cath at discharge 15: SAH:  completed 7 days of Keppra; follow-up with neurosurgery 16: Volume overload/peripheral edema: received lasix 40mg  x 1 last week             -strict Is and Os and daily weights             -weights are stable Filed Weights   01/30/22 0500 01/31/22 0500 02/01/22 0428  Weight: 63.5 kg 66.2 kg 64.4 kg    -10/10 weights inconsistent 17: AKI: see above 18: Elevated transaminases: trending downward; follow-up CMP tomorrow 19:  ?aspiration pneumonia w/x hx of staph and pseudomonas - vancomycin and cefepime total of 7 more days ended 10/5 -continue robitussin             10/9 wbcs 7.7 20: SVT/sinus tach intermittently: monitor 21: Mediastinal hematoma: Echo WNL; Hgb stable 22: Ankle edema and ecchymosis: x-rays negative for osseous abnormality           Continue TEDS, elevation in bed 23. Loose stools. No s/s infectious diarrhea. Likely d/t abx and Tfs. Getting lomotil PRN.    -expect further improvement off TF (in addition to abx)  -10/8  Last BM   24. Normocytic anemia/ABLA: likely related to hospitalization and chronic disease  -no gross blood loss  -added b complex and iron  -continue to follow  -10/9 hgb 7.8   LOS: 14 days A FACE TO [place] 02/02/2022, 8:03 AM

## 2022-02-02 NOTE — Progress Notes (Signed)
Physical Therapy TBI Note  Patient Details  Name: Dominic Smith MRN: [id number] Date of Birth: [birth year]  Today's Date: 02/02/2022 PT Individual Time: 1015-1100 PT Individual Time Calculation (min): 45 min   Short Term Goals: Week 2:  PT Short Term Goal 1 (Week 2): Patient will perform basic transfers with min A >50% of the time using LRAD. PT Short Term Goal 2 (Week 2): Patient will perform bed mobility with min A >50% of the time. PT Short Term Goal 3 (Week 2): Patient will ambulate >200 ft with min A using LRAD. PT Short Term Goal 4 (Week 2): Patient will initiate stair training.  Skilled Therapeutic Interventions/Progress Updates:     Patient in bed asleep upon PT arrival. Patient did not arouse to verbal or tactile stimulation. Patient breathing evenly. Per chart and LPN patient received Ativan last night for agitation and has been sleeping all morning.   Returned 15 min later and patient able to respond to verbal stimuli with eyes closed. Patient lethargic, but participated in PT session. Patient denied pain during session.  Therapeutic Activity: Bed Mobility: Patient performed supine to/from sit with max-total A due to decreased arousal. HOB elevated for trunk control to come to sitting. Provided verbal cues for initiation. Transfers: Patient performed stand pivot bed<>TIS w/c with mod A and increased time to initiate sitting with facilitation at patient's hip. He performed sit to/from stand x1 with min A to stand and mod to sit. Provided verbal cues and facilitation for initiation and forward weight shift.  Patient participated in visual/cognitive task sitting in TIS w/c, identified the color, stated pink instead of red, shape selecting correct shape out of 2 options, and read the correct number on one colored dot. Initiated a second, but patient closed his eyes and was unable to reengage with activity.   Initiated ambulation in the day room with min-mod A with L HHA. Stopped  patient to trial locating an object and naming an object from 2 options in standing. Patient became non-responsive and unable to redirect to cognitive task. Able to reinitiate gait following facilitating weight shifting, however patient with increased R lean and instability and +2 provided w/c behind for safety, however patient with difficulty initiating sitting and requiring max +2 to sit. Unable to obtain vitals, however patient more responsive and able to converse with PT after sitting <1 min.   Patient in bed at end of session with breaks locked, bed alarm set, soft waist belt and B hand mitts secured, Telesitter in place, and all needs within reach.   Therapy Documentation Precautions:  Precautions Precautions: Fall, Cervical Precaution Booklet Issued: No Precaution Comments: cortrak, bilateral soft mitts Required Braces or Orthoses: Sling, Cervical Brace Cervical Brace: At all times, Hard collar Restrictions Weight Bearing Restrictions: Yes RUE Weight Bearing: Non weight bearing Other Position/Activity Restrictions: in sling, nonoperative mgmt per ortho 9/18 General: PT Amount of Missed Time (min): 15 Minutes Agitated Behavior Scale: TBI Observation Details Observation Environment: [address] [place] Start of observation period - Date: 02/02/22 Start of observation period - Time: 0915 End of observation period - Date: 02/02/22 End of observation period - Time: 1000 Agitated Behavior Scale (DO NOT LEAVE BLANKS) Short attention span, easy distractibility, inability to concentrate: Present to a moderate degree Impulsive, impatient, low tolerance for pain or frustration: Absent Uncooperative, resistant to care, demanding: Absent Violent and/or threatening violence toward people or property: Absent Explosive and/or unpredictable anger: Absent Rocking, rubbing, moaning, or other self-stimulating behavior: Absent Pulling at tubes, restraints, etc.:  Absent Wandering from treatment areas:  Absent Restlessness, pacing, excessive movement: Absent Repetitive behaviors, motor, and/or verbal: Absent Rapid, loud, or excessive talking: Absent Sudden changes of mood: Absent Easily initiated or excessive crying and/or laughter: Absent Self-abusiveness, physical and/or verbal: Absent Agitated behavior scale total score: 16    Therapy/Group: Individual Therapy  Elliott Lasecki L Turkessa Ostrom PT, DPT, NCS, CBIS  02/02/2022, 12:21 PM

## 2022-02-02 NOTE — Progress Notes (Signed)
Pt awake from 7p-2am. Gave ativan and pt sleep since around 2am and still asleep now at [phone]
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
## 2022-02-02 NOTE — Progress Notes (Signed)
Received call from nurse, reporting patient is combative , agitated, belligerent and hitting staff. Spoke with pharmacist, he had Ativan in the past order was given. Katharine Look RN notified

## 2022-02-02 NOTE — Progress Notes (Signed)
Occupational Therapy TBI Note  Patient Details  Name: Dominic Smith MRN: [id number] Date of Birth: June 05, 1940  Today's Date: 02/02/2022 OT Individual Time: 0700-0730 OT Individual Time Calculation (min): 30 min  and Today's Date: 02/02/2022 OT Missed Time: 30 Minutes Missed Time Reason: Patient fatigue (pt recieved ativan and unable to remain awake)  Today's Date: 02/02/2022 OT Individual Time: 1500-1530 OT Individual Time Calculation (min): 30 min   Short Term Goals: Week 1:  OT Short Term Goal 1 (Week 1): Pt will consistently transfer to toilet with 1 caregiver OT Short Term Goal 1 - Progress (Week 1): Met OT Short Term Goal 2 (Week 1): Pt will complete 1/4 steps of donning shirt OT Short Term Goal 2 - Progress (Week 1): Progressing toward goal OT Short Term Goal 3 (Week 1): Pt will complete oral care with MOD A OT Short Term Goal 3 - Progress (Week 1): Progressing toward goal OT Short Term Goal 4 (Week 1): Pt will sit to stand with MOD A or less OT Short Term Goal 4 - Progress (Week 1): Met  Skilled Therapeutic Interventions/Progress Updates:     Pt received in bed with no pain. When asked how his night was, pt states, "better than theirs." Pt then reported, "lying, cheating and stealing" was why he was so upset last night.  Initially pt resonding to OT questions and only oriented to self. Giving generalized answers to other orientation questions while OT dons teds, non skid socks and checks brief. Pt then getting more lethargic. Quick brief change provided with little to no initiation for rolling in bed. Pt then sat up EOB with bed deflated and still lethargic. With eyes closed and increased time, Pt able to respond wanting to "lay down" when given the choice of laying down or getting up. Pt missed 30 min skilled OT d/t receiving ativan in early morning.  Pt left at end of session in bed with exit alarm on, call light in reach and all needs met  Session 2: pt received in bed with  no pain and only oriented to self. After orienting pt to hospital pt able to say he was at the hospital but perseverative on "selling, marketing and buying BBQ." Pt unable to be redirected therefore OT used this as motivator for mobility and getting out of room to locate people to assist with the process. Pt stating, "if you market it right, the people will come to you." Finally pt mobilizes with direct VC for stad up and walk. Pt taken to dayroom and shown a $5 and $1 bill and asked which is worth more. Pt states, "well it depends what type of sauce you put on it and what meat it is." Pt then given a quarter which pt does not visually attend to but feels around on Ots hand to find. When asked how is this a quarter or a penny pt states, "it depends." Pt remains profoundly confused and unable to answer cognitive tasks.    Therapy Documentation Precautions:  Precautions Precautions: Fall, Cervical Precaution Booklet Issued: No Precaution Comments: cortrak, bilateral soft mitts Required Braces or Orthoses: Sling, Cervical Brace Cervical Brace: At all times, Hard collar Restrictions Weight Bearing Restrictions: Yes RUE Weight Bearing: Non weight bearing Other Position/Activity Restrictions: in sling, nonoperative mgmt per ortho 9/18 General: General OT Amount of Missed Time: 30 Minutes Vital Signs:   Pain:   Agitated Behavior Scale: TBI  Observation Details Observation Environment: CIR Start of observation period - Date: 02/02/22 Start of  observation period - Time: 0700 End of observation period - Date: 02/02/22 End of observation period - Time: 0730 Agitated Behavior Scale (DO NOT LEAVE BLANKS) Short attention span, easy distractibility, inability to concentrate: Present to a moderate degree Impulsive, impatient, low tolerance for pain or frustration: Absent Uncooperative, resistant to care, demanding: Absent Violent and/or threatening violence toward people or property:  Absent Explosive and/or unpredictable anger: Absent Rocking, rubbing, moaning, or other self-stimulating behavior: Absent Pulling at tubes, restraints, etc.: Absent Wandering from treatment areas: Absent Restlessness, pacing, excessive movement: Absent Repetitive behaviors, motor, and/or verbal: Absent Rapid, loud, or excessive talking: Absent Sudden changes of mood: Absent Easily initiated or excessive crying and/or laughter: Absent Self-abusiveness, physical and/or verbal: Absent Agitated behavior scale total score: 16    Therapy/Group: Individual Therapy  [name] 02/02/2022, 7:41 AM

## 2022-02-03 LAB — BASIC METABOLIC PANEL
Anion gap: 11 (ref 5–15)
BUN: 19 mg/dL (ref 8–23)
CO2: 25 mmol/L (ref 22–32)
Calcium: 8.6 mg/dL — ABNORMAL LOW (ref 8.9–10.3)
Chloride: 105 mmol/L (ref 98–111)
Creatinine, Ser: 0.96 mg/dL (ref 0.61–1.24)
GFR, Estimated: >60 mL/min (ref >60)
Glucose, Bld: 107 mg/dL — ABNORMAL HIGH (ref 70–99)
Potassium: 4.1 mmol/L (ref 3.5–5.1)
Sodium: 141 mmol/L (ref 135–145)

## 2022-02-03 LAB — GLUCOSE, CAPILLARY
Glucose-Capillary: 107 mg/dL — ABNORMAL HIGH (ref 70–99)
Glucose-Capillary: 111 mg/dL — ABNORMAL HIGH (ref 70–99)
Glucose-Capillary: 119 mg/dL — ABNORMAL HIGH (ref 70–99)
Glucose-Capillary: 87 mg/dL (ref 70–99)
Glucose-Capillary: 87 mg/dL (ref 70–99)

## 2022-02-03 MED ORDER — MEGESTROL ACETATE 400 MG/10ML PO SUSP
400.0000 mg | Freq: Two times a day (BID) | ORAL | Status: DC
  Administered 2022-02-03 – 2022-02-08 (×12): 400 mg
  Filled 2022-02-03 (×13): qty 10

## 2022-02-03 NOTE — Progress Notes (Signed)
Physical Therapy Weekly Progress Note  Patient Details  Name: Xzaviar Maloof MRN: [id number] Date of Birth: 09-May-1940  Beginning of progress report period: January 27, 2022 End of progress report period: February 03, 2022  Today's Date: 02/03/2022 PT Individual Time: 0845-1000 PT Individual Time Calculation (min): 75 min   Patient has met 3 of 4 short term goals. Patient is making slow, but steady progress, limited by cognitive deficits with decreased attention, arousal, consistent language of confusion, and poor recall of new information limiting carry-over between sessions. Patient also limited by symptomatic orthostatic hypotension. Requires B thigh high TEDs, B thigh high ACE wraps, and abdominal binder to prevent hypotension with mobility. Notable improvement in awareness of deficits at end of the week leading to emotional lability with tearful moments and moments of agitation or frustration at night with nursing staff. Have initiated verbal TBI education and education on patient's progress with his son, [name], and will follow-up with hands on training closer to d/c.  Patient continues to demonstrate the following deficits decreased cardiorespiratoy endurance, impaired timing and sequencing, ataxia, decreased coordination, and decreased motor planning, decreased visual perceptual skills and decreased visual motor skills, decreased initiation, decreased attention, decreased awareness, decreased problem solving, decreased safety awareness, decreased memory, delayed processing, and demonstrates behaviors consistent with Rancho Level V, and decreased sitting balance, decreased standing balance, decreased postural control, and decreased balance strategies and therefore will continue to benefit from skilled PT intervention to increase functional independence with mobility.  Patient progressing toward long term goals..  Continue plan of care.  PT Short Term Goals Week 2:  PT Short Term Goal 1 (Week  2): Patient will perform basic transfers with min A >50% of the time using LRAD. PT Short Term Goal 1 - Progress (Week 2): Met PT Short Term Goal 2 (Week 2): Patient will perform bed mobility with min A >50% of the time. PT Short Term Goal 2 - Progress (Week 2): Progressing toward goal PT Short Term Goal 3 (Week 2): Patient will ambulate >200 ft with min A using LRAD. PT Short Term Goal 3 - Progress (Week 2): Met PT Short Term Goal 4 (Week 2): Patient will initiate stair training. PT Short Term Goal 4 - Progress (Week 2): Met Week 3:  PT Short Term Goal 1 (Week 3): Patient will perform bed mobility with min A >50% of the time. PT Short Term Goal 2 (Week 3): Patient will perform basic transfers with min A consistently. PT Short Term Goal 3 (Week 3): Patient will ambulate >300 ft with min A without need for additional assist for LOB. PT Short Term Goal 4 (Week 3): Patient will participate in a formal balance assessment.  Skilled Therapeutic Interventions/Progress Updates:     Patient in bed with c-collar donned and arm sling in place with RN in the room preparing morning medications upon PT arrival. Patient alert and agreeable to PT session. Oriented to self and location without cues, disoriented to situation and time. Able to select the correct year out of 2 options, unable to select correct month. Provided orientation to situation and time during session. Patient became tearful during orientation questions. 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 Patient reports he is not usually an emotional person.   Patient able to report R arm pain with mobility during session today (un-rated due to cognitive deficits) , RN made aware. PT provided repositioning, rest breaks, and distraction as pain interventions throughout session.   Donned B  thigh high TED hose and non-skid socks, and applied abdominal binder with total A at beginning of  session. Patient with large bruise medial to his patella, new in appearance, RN made aware and discussed adding upper rail pads and checking bed rail pads throughout the day and night to prevent patient injury due to patient with increased restlessness.   Orthostatic Vitals: Supine: BP 140/106, HR 90  Sitting: BP 132/66, HR 96-0  Standing: BP 88/55, HR 100 Standing x3 min: BP 94/51, HR 105  Therapeutic Activity: Bed Mobility: Patient performed supine to sit with min-mod A with HOB elevated. Provided verbal cues for initiation and sequencing. Transfers: Patient performed sit to stand, standing 3 min, and progressing to pivot bed>TIS w/c with min A with L HHA. Provided cues for initiation and stepping sequencing during turn. He performed sit to/from stand x2 with L HHA with min A and cues for initiation and forward weight shift.    Gait Training:  Patient ambulated ~80 feet using L HHA with min A before reporting sudden onset of fatigue. Return to sitting, unable to obtain BP reading due to limited access to equipment. Allowed patient to rest until symptoms resolved, then patient ambulated back to his TIS w/c ~80 feet with +2 on standby incase of need of chair. Ambulated with decreased gait speed, decreased step length and height, narrow BOS, forward trunk lean, and downward head gaze. Provided verbal cues for erect posture and increased step length and width. Facilitated forward propulsion throughout for increased gait speed and step length.    Patient in seated in TIS w/c handed off to LPN and nursing secretary at the nurses station for 1-1 supervision in the chair at end of session with breaks locked, soft waist restraint secured, B mitts donned due to increased arousal and restlessness this afternoon, and all needs within reach.   RN made aware of patient's BP and symptoms during session. Patient's BP 118/68, HR 98 sitting in w/c at end of session. Informed OT to apply B thigh high ACE wraps  during upcoming session for improved hypotension and symptoms.   ABS: 20  Therapy Documentation Precautions:  Precautions Precautions: Fall, Cervical Precaution Booklet Issued: No Precaution Comments: cortrak, bilateral soft mitts Required Braces or Orthoses: Sling, Cervical Brace Cervical Brace: At all times, Hard collar Restrictions Weight Bearing Restrictions: Yes RUE Weight Bearing: Non weight bearing Other Position/Activity Restrictions: in sling, nonoperative mgmt per ortho 9/18   Therapy/Group: Individual Therapy  Gottlieb Zuercher L Sabella Traore PT, DPT, NCS, CBIS  02/03/2022, 7:52 PM

## 2022-02-03 NOTE — Progress Notes (Signed)
Patient ID: Dominic Smith, male   DOB: Apr 30, 1940, 81 y.o.   MRN: [id number]  SW returned phone call to pt ex-wife Dominic Smith who had questions about a family meeting and nursing home options. SW left message requesting return phone call to discuss in detail pt care needs, and SW also informed unable to provide suggestions on SNFs. SW shared can scheduled family meeting if needed, and reiterated that family education was suggested to family.   *SW received return phone call from [facility]. Discussed in detail discharge plan. Pt will go to SNF. They have not picked up SNF list. SW explained SNF placement process. SW emailed list to her for her review, and suggested viewing facilities before making selection. She would still like to schedule family edu. SW provided times, and asked her to follow-up when she would like to come in.   Loralee Pacas, MSW, [place] Office: [phone] Cell: [phone] Fax: [phone]

## 2022-02-03 NOTE — Progress Notes (Signed)
Occupational Therapy Weekly Progress Note  Patient Details  Name: Dominic Smith MRN: [id number] Date of Birth: [birth year]  Beginning of progress report period: January 27, 2022 End of progress report period: February 03, 2022  Today's Date: 02/03/2022 OT Individual Time: 0569-7948 OT Individual Time Calculation (min): 60 min  and Today's Date: 02/03/2022 OT Missed Time: 15 Minutes Missed Time Reason:  (emotionally labile and unable to attend)   Patient has met 3 of 4 short term goals.  Pt has made slow progress towards OT goals this reporting period. Pt with brief moments of agitation mostly at night time, but not during day time. Pt with increasing emotional lability and internally distracted by thoughts. Pt was able to follow commands better for bathing this date with step by step cuing for washing. Total A for dressing and toileting MIN-MOD A for functional mobility.  Patient continues to demonstrate the following deficits: muscle weakness, decreased cardiorespiratoy endurance, impaired timing and sequencing, motor apraxia, and decreased coordination, decreased visual perceptual skills and decreased visual motor skills, decreased motor planning and ideational apraxia, decreased initiation, decreased attention, decreased awareness, decreased problem solving, decreased safety awareness, decreased memory, delayed processing, and demonstrates behaviors consistent with Rancho Level 5-6, and decreased sitting balance, decreased standing balance, decreased postural control, decreased balance strategies, and difficulty maintaining precautions and therefore will continue to benefit from skilled OT intervention to enhance overall performance with BADL and Reduce care partner burden.  Patient progressing toward long term goals..  Continue plan of care.  OT Short Term Goals Week 2:  OT Short Term Goal 1 (Week 2): Pt will complete 1/4 steps of donning shirt OT Short Term Goal 1 - Progress (Week 2):  Progressing toward goal OT Short Term Goal 2 (Week 2): Pt will wash 2/10 body parts OT Short Term Goal 2 - Progress (Week 2): Met OT Short Term Goal 3 (Week 2): Pt will initate oral care with min HOH A OT Short Term Goal 3 - Progress (Week 2): Met OT Short Term Goal 4 (Week 2): Pt will consistently initate sit to stand wiht no more than MIN A OT Short Term Goal 4 - Progress (Week 2): Met Week 3:  OT Short Term Goal 1 (Week 3): Pt will complete 1/3 steps of donning shirt OT Short Term Goal 2 (Week 3): Pt will compelte toilet transfer with MIN A consistently OT Short Term Goal 3 (Week 3): Pt will wash 75% of body with MOD cuing for sequencing OT Short Term Goal 4 (Week 3): Pt will complete 1/3 steps of dressing  Skilled Therapeutic Interventions/Progress Updates:    Pt received in TIS with son present. Updated son on CLOF with increased time. Son left to run errands. OT applies teds and ace wraps to BLE. Overall pt very lost in thought, tearful and emotionally labile throughout session. Provided support and encouragement for pt as well as education about TBI processing/emotional labiltiy. Pt completes peg board activity with alternating pattern to look at selective attention, cognition, and visual scanning. Pt requires MAX cuing for attention to task and able ot select correct colors 2/5x but requires SIGNIFICANTLY increased time to imitate and direct VC for attention to task and pt remains intermittently teary stating "Ive got a lot on my mind."  Therapy Documentation Precautions:  Precautions Precautions: Fall, Cervical Precaution Booklet Issued: No Precaution Comments: cortrak, bilateral soft mitts Required Braces or Orthoses: Sling, Cervical Brace Cervical Brace: At all times, Hard collar Restrictions Weight Bearing Restrictions: Yes RUE Weight Bearing:  Non weight bearing Other Position/Activity Restrictions: in sling, nonoperative mgmt per ortho 9/18 ABS 20  Therapy/Group: Individual  Therapy  [name] 02/03/2022, 6:44 AM
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
## 2022-02-03 NOTE — Progress Notes (Signed)
Speech Language Pathology Weekly Progress and Session Note  Patient Details  Name: Dominic Smith MRN: [id number] Date of Birth: 01/06/1941  Beginning of progress report period: January 27, 2022 End of progress report period: February 03, 2022  Today's Date: 02/03/2022 SLP Individual Time: 0730-0810 SLP Individual Time Calculation (min): 40 min  Short Term Goals: Week 2: SLP Short Term Goal 1 (Week 2): Patient will consume trials of ice chips and initiate a swallow response in 100% of opportunities with Mod verbal cues to assess readiness for MBS. SLP Short Term Goal 1 - Progress (Week 2): Met SLP Short Term Goal 2 (Week 2): Patient will demonstrate sustained attention to a functional task for 2 minutes with Max A multimodal cues for redirection. SLP Short Term Goal 2 - Progress (Week 2): Not met SLP Short Term Goal 3 (Week 2): Patient will orient to place, time and situation with Max A multimodal cues. SLP Short Term Goal 3 - Progress (Week 2): Not met SLP Short Term Goal 4 (Week 2): Patient will answer yes/no questions with 25% accuracy with Max A multimodal cues. SLP Short Term Goal 4 - Progress (Week 2): Met SLP Short Term Goal 5 (Week 2): Patient will follow 1-step commands in 25% of opportunities with Max A multimodal cues. SLP Short Term Goal 5 - Progress (Week 2): Met    New Short Term Goals: Week 3: SLP Short Term Goal 1 (Week 3): Patient will consume current diet with minimal overt s/s of aspiration and Mod verbal cues for use of swallowing compensatory strategies. SLP Short Term Goal 2 (Week 3): Patient will orient to place and month with Max A multimodal cues. SLP Short Term Goal 3 (Week 3): Patient will follow commands throughout functional tasks in 50% of opportunities with Max multimodal cues. SLP Short Term Goal 4 (Week 3): Patient will demonstate sustained attenton to functional tasks for 2 minutes with Max A multimodal cues for redirection.  Weekly Progress Updates:  Patient has made functional but inconsistent gains and has met 3 of 5 STGs this reporting period. Currently, patient continues to demonstrate behaviors consistent with a [place] Level IV-V and requires overall Max-Total A multimodal cues to complete functional and familiar tasks safely in regards to initiation, sustained attention, orientation, functional problem solving, intellectual awareness and recall. Patient also demonstrates language of confusion with confabulation, intermittent verbosity, and intermittent agitation. Patient is consuming Dys. 2 textures with thin liquids with minimal overt s/s of aspiration with Max A multimodal cues needed for self-feeding and for attention to task. Due to patient's severe cognitive impairments, patient has minimal PO intake and continues to require a cortrak for nutritional support. Patient and family education ongoing. Patient would benefit from continued skilled SLP intervention to maximize his cognitive and swallowing function prior to discharge.     Intensity: Minumum of 1-2 x/day, 30 to 90 minutes Frequency: 3 to 5 out of 7 days Duration/Length of Stay: 02/17/22 Treatment/Interventions: Cognitive remediation/compensation;Dysphagia/aspiration precaution training;Speech/Language facilitation;Internal/external aids;Cueing hierarchy;Environmental controls;Therapeutic Activities;Functional tasks;Patient/family education   Daily Session  Skilled Therapeutic Interventions:   Skilled treatment session focused on cognitive and dysphagia goals. Upon arrival, patient was awake while upright in the bed. SLP facilitated session by repositioning patient to maximize attention and overall safety with PO intake. Patient consumed his breakfast meal of Dys. 2 textures with thin liquids with intermittent subtle throat clearing, however, it does not appeared related to swallowing function. Recommend patient continue current diet. Patient able to self-feed after SLP scooped bolus    into the spoon with Min verbal cues. Patient was oriented to hospital from a field of 2 and independently was oriented to situation. Patient with decreased language of confusion but remains verbose. Patient left upright in bed with alarm on and all needs within reach. Continue with current plan of care.    Pain No/Denies Pain   Therapy/Group: Individual Therapy  ,  02/03/2022, 6:38 AM       

## 2022-02-04 LAB — CBC
HCT: 25.4 % — ABNORMAL LOW (ref 39.0–52.0)
Hemoglobin: 8.3 g/dL — ABNORMAL LOW (ref 13.0–17.0)
MCH: 29.2 pg (ref 26.0–34.0)
MCHC: 32.7 g/dL (ref 30.0–36.0)
MCV: 89.4 fL (ref 80.0–100.0)
Platelets: 321 10*3/uL (ref 150–400)
RBC: 2.84 MIL/uL — ABNORMAL LOW (ref 4.22–5.81)
RDW: 13.2 % (ref 11.5–15.5)
WBC: 7.3 10*3/uL (ref 4.0–10.5)
nRBC: 0 % (ref 0.0–0.2)

## 2022-02-04 LAB — GLUCOSE, CAPILLARY
Glucose-Capillary: 116 mg/dL — ABNORMAL HIGH (ref 70–99)
Glucose-Capillary: 110 mg/dL — ABNORMAL HIGH (ref 70–99)
Glucose-Capillary: 92 mg/dL (ref 70–99)
Glucose-Capillary: 106 mg/dL — ABNORMAL HIGH (ref 70–99)
Glucose-Capillary: 83 mg/dL (ref 70–99)
Glucose-Capillary: 103 mg/dL — ABNORMAL HIGH (ref 70–99)

## 2022-02-04 LAB — PREALBUMIN: Prealbumin: 13 mg/dL — ABNORMAL LOW (ref 18–38)

## 2022-02-04 MED ORDER — SORBITOL 70 % SOLN
60.0000 mL | Freq: Once | Status: AC
  Administered 2022-02-04: 60 mL via ORAL
  Filled 2022-02-04: qty 60
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
## 2022-02-04 NOTE — Progress Notes (Signed)
Occupational Therapy TBI Note  Patient Details  Name: Dominic Smith MRN: [id number] Date of Birth: Oct 25, 1940  Today's Date: 02/04/2022 OT Individual Time: 0905-1001 OT Individual Time Calculation (min): 56 min    Short Term Goals: Week 1:  OT Short Term Goal 1 (Week 1): Pt will consistently transfer to toilet with 1 caregiver OT Short Term Goal 1 - Progress (Week 1): Met OT Short Term Goal 2 (Week 1): Pt will complete 1/4 steps of donning shirt OT Short Term Goal 2 - Progress (Week 1): Progressing toward goal OT Short Term Goal 3 (Week 1): Pt will complete oral care with MOD A OT Short Term Goal 3 - Progress (Week 1): Progressing toward goal OT Short Term Goal 4 (Week 1): Pt will sit to stand with MOD A or less OT Short Term Goal 4 - Progress (Week 1): Met  Skilled Therapeutic Interventions/Progress Updates:     Pt received in bed with intermittent c/o RUE pain. Repositioning in sling  provided for pain relief  ADL: Pt completes ADL at overall MOD-MAX A Level! Skilled interventions include: gentle reorientation and direct VC with moments of distraction and perseveration,increased time for pt to imitate and short concise cues for improved processing. Tactile cues used for sequencing bathing body parts. Pt able ot thread 1LE into pants today and sequence UB and LB dressing appropriately. Min-MODA for power up for transitional movements sit to stand.   Pt left at end of session in RN station with exit alarm on, call light in reach and all needs met   Therapy Documentation Precautions:  Precautions Precautions: Fall, Cervical Precaution Booklet Issued: No Precaution Comments: cortrak, bilateral soft mitts Required Braces or Orthoses: Sling, Cervical Brace Cervical Brace: At all times, Hard collar Restrictions Weight Bearing Restrictions: Yes RUE Weight Bearing: Non weight bearing Other Position/Activity Restrictions: in sling, nonoperative mgmt per ortho 9/18 General:    Vital Signs:  Pain: Pain Assessment Pain Scale: 0-10 Pain Score: 0-No pain Agitated Behavior Scale: TBI  Observation Details Observation Environment: CIR Start of observation period - Date: 02/04/22 Start of observation period - Time: 0905 End of observation period - Date: 02/04/22 End of observation period - Time: 1001 Agitated Behavior Scale (DO NOT LEAVE BLANKS) Short attention span, easy distractibility, inability to concentrate: Present to a slight degree Impulsive, impatient, low tolerance for pain or frustration: Absent Uncooperative, resistant to care, demanding: Absent Violent and/or threatening violence toward people or property: Absent Explosive and/or unpredictable anger: Absent Rocking, rubbing, moaning, or other self-stimulating behavior: Absent Pulling at tubes, restraints, etc.: Absent Wandering from treatment areas: Absent Restlessness, pacing, excessive movement: Absent Repetitive behaviors, motor, and/or verbal: Absent Rapid, loud, or excessive talking: Absent Sudden changes of mood: Absent Easily initiated or excessive crying and/or laughter: Absent Self-abusiveness, physical and/or verbal: Absent Agitated behavior scale total score: 15   Therapy/Group: Individual Therapy  [name] 02/04/2022, 10:11 AM

## 2022-02-04 NOTE — Progress Notes (Signed)
Physical Therapy Session Note  Patient Details  Name: Keyan Folson MRN: [id number] Date of Birth: August 02, 1940  Today's Date: 02/04/2022 PT Individual Time: 1030-1100 and 1345-1435 PT Individual Time Calculation (min): 30 min and 50 min  Short Term Goals: Week 2:  PT Short Term Goal 1 (Week 2): Patient will perform basic transfers with min A >50% of the time using LRAD. PT Short Term Goal 1 - Progress (Week 2): Met PT Short Term Goal 2 (Week 2): Patient will perform bed mobility with min A >50% of the time. PT Short Term Goal 2 - Progress (Week 2): Progressing toward goal PT Short Term Goal 3 (Week 2): Patient will ambulate >200 ft with min A using LRAD. PT Short Term Goal 3 - Progress (Week 2): Met PT Short Term Goal 4 (Week 2): Patient will initiate stair training. PT Short Term Goal 4 - Progress (Week 2): Met  Skilled Therapeutic Interventions/Progress Updates:   First session:  Pt presents sitting in TIS at nursing station and unsure of participation w/ therapy. Pt wheeled to dayroom for time conservation.  Pt unwilling to participate w/ therapy.  L hand mitt removed and pt will hold w/ L hand but unwilling to stand or amb.  Pt tapping foot on floor, refusing to get up even w/ music played.  Pt states that it is Wednesday and even said "Happy Easter" to me when asked what day it was.  Pt sitting w/ posterior pelvic tilt but unwilling to change position.  Pt unwilling to sit forward.  Pt encouraged to participate and educated on need for therapy for improved function and return to home and pt acknowledges this but still unwilling to participate.  Pt eventually returned to tilted back and returned to nursing station w/ chair alarm on.  Second session: Pt presents w/ NT in room and starting to change brief.  PT assist w/ sit to stand w/ mod A and encouragement.  Pt stood for change.  Pt wheeled to dayroom for energy conservation.  Pt amb w/ L HHA and mod A for initiation and then for turns.   Pt amb x 58' x 2.  Pt amb again including turns to return to seat, but then froze, unable to initiate step even w/ facilitation.  W/c brought behind pt.  BP checked at 119/68 in sitting and then stood but unable to stand long enough for BP, returned to sit and BP measured at 88/52.  Pt returned to room and performed stepping pivot w/c to bed w/ mod A.  Pt handed off to nursing.  Missed time of 25' 2/2 BP issues/fatigue.     Therapy Documentation Precautions:  Precautions Precautions: Fall, Cervical Precaution Booklet Issued: No Precaution Comments: cortrak, bilateral soft mitts Required Braces or Orthoses: Sling, Cervical Brace Cervical Brace: At all times, Hard collar Restrictions Weight Bearing Restrictions: Yes RUE Weight Bearing: Non weight bearing Other Position/Activity Restrictions: in sling, nonoperative mgmt per ortho 9/18 General:   Vital Signs:  Pain: no c/o pain Pain Assessment Pain Scale: 0-10 Pain Score: 0-No pain Mobility:     Therapy/Group: Individual Therapy  [name] 02/04/2022, 11:03 AM

## 2022-02-04 NOTE — Progress Notes (Addendum)
Speech Language Pathology TBI Note  Patient Details  Name: Dominic Smith MRN: [id number] Date of Birth: 25-Sep-1940  Today's Date: 02/04/2022 SLP Individual Time: 0715-0800 SLP Individual Time Calculation (min): 45 min  Short Term Goals: Week 3: SLP Short Term Goal 1 (Week 3): Patient will consume current diet with minimal overt s/s of aspiration and Mod verbal cues for use of swallowing compensatory strategies. - During intake of currently recommended diet (Dysphagia 2 and thin liquids via straw), pt exhibits intermittent throat clear post-swallow; however, did not appear to r/t PO intake; vocal quality clear and dry throughout intake. Minimal oral residuals noted on lingual surface post-swallow and benefited from Mod A for improved awareness of residuals and problem-solving for use of liquid rinse and lingual sweep to R to aid in oral clearance. Mod A for self-feeding; benefits from therapist pre-loading spoon and providing Mod A visual and verbal cues for motor initiation + termination and sequencing.   SLP Short Term Goal 2 (Week 3): Patient will orient to place and month with Max A multimodal cues. - Pt verbalized orientation to place and month given Total A (errorless learning), faded to Mod-Max A phonemic prompts.  SLP Short Term Goal 3 (Week 3): Patient will follow commands throughout functional tasks in 50% of opportunities with Max multimodal cues. - Pt followed functional one-step commands within the context of AM meal on ~95% of opportunities given Mod A verbal and visual cues.  SLP Short Term Goal 4 (Week 3): Patient will demonstate sustained attenton to functional tasks for 2 minutes with Max A multimodal cues for redirection. - Pt sustained attention to AM meal for up to 5 minutes with Sup A and no cues for redirection.  Skilled Therapeutic Interventions: S: Pt seen this date for skilled ST intervention targeting swallowing and cognitive goals outlined above. Pt received  awake/alert and lying in bed. Agreeable to ST intervention at bedside. Pt with seemingly improved alertness, cooperation, and engagement this session, in comparison to previous sessions.  O: Please see above for objective data re: pt's performance on targeted goals. 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 Pt verbalized understanding though unable to demonstrate understanding at this time; therefore, will benefit from reinforcement. Of note, palliative care NP present for a portion of today's session, with pt clearly verbalizing that he would not want long-term alternate means of nutrition and hydration moving forward. Discussed the importance of PO intake with pt; her verbalized understanding, though learning is limited at this time due to the severity of his cognitive deficits.  A: Pt appears sitmulable for skilled ST intervention as evident by positive response to errorless learning and spaced retrieval techniques this session, in addition to phonemic cues for recall, focusing on one task at a time, and providing extended processing time. Recommend continuation of current diet textures (Dysphagia 2 with thin liquids via straw) with full supervision + assistance from staff for self-feeding, and adherence to aspiration precautions to include slow rate, alternating bite/sips, and providing small + single bites/sips.  P: Pt left in bed with all safety measures activated. Call bell reviewed and within reach and all immediate needs met. Direct hand off to RN for blood sugar check. Continue per current ST POC next session.   Pain No reports of pain; NAD  Agitated Behavior Scale: TBI Observation Details Observation Environment: CIR Start of observation period - Date: 02/04/22 Start of observation  period - Time: 0715 End of observation period - Date:  02/04/22 End of observation period - Time: 0800 Agitated Behavior Scale (DO NOT LEAVE BLANKS) Short attention span, easy distractibility, inability to concentrate: Present to a slight degree Impulsive, impatient, low tolerance for pain or frustration: Absent Uncooperative, resistant to care, demanding: Absent Violent and/or threatening violence toward people or property: Absent Explosive and/or unpredictable anger: Absent Rocking, rubbing, moaning, or other self-stimulating behavior: Absent Pulling at tubes, restraints, etc.: Absent Wandering from treatment areas: Absent Restlessness, pacing, excessive movement: Absent Repetitive behaviors, motor, and/or verbal: Absent Rapid, loud, or excessive talking: Absent Sudden changes of mood: Absent Easily initiated or excessive crying and/or laughter: Absent Self-abusiveness, physical and/or verbal: Absent Agitated behavior scale total score: 15  Therapy/Group: Individual Therapy  Caleigha Zale A Heyden Jaber 02/04/2022, 8:07 AM

## 2022-02-04 NOTE — Progress Notes (Signed)
PROGRESS NOTE   Subjective/Complaints:  Had a good night. Up in bed. Ate 50% breakfast today. Could recollect that he had some eggs.   ROS: Limited due to cognitive/behavioral    Objective:   No results found.  Recent Labs    02/04/22 0600  WBC 7.3  HGB 8.3*  HCT 25.4*  PLT 321     Recent Labs    02/03/22 0542  NA 141  K 4.1  CL 105  CO2 25  GLUCOSE 107*  BUN 19  CREATININE 0.96  CALCIUM 8.6*     Intake/Output Summary (Last 24 hours) at 02/04/2022 0856 Last data filed at 02/04/2022 0800 Gross per 24 hour  Intake 60 ml  Output 2050 ml  Net -1990 ml      Pressure Injury 01/13/22 Coccyx Mid Unstageable - Full thickness tissue loss in which the base of the injury is covered by slough (yellow, tan, gray, green or brown) and/or eschar (tan, brown or black) in the wound bed. skin is broken with redness at the (Active)  01/13/22 1930  Location: Coccyx  Location Orientation: Mid  Staging: Unstageable - Full thickness tissue loss in which the base of the injury is covered by slough (yellow, tan, gray, green or brown) and/or eschar (tan, brown or black) in the wound bed.  Wound Description (Comments): skin is broken with redness at the base  Present on Admission: Yes (present on admission to rehab 9/27)    Physical Exam: Vital Signs Blood pressure (!) 117/46, pulse (!) 101, temperature 97.6 F (36.4 C), resp. rate 14, height 5\' 11"  (1.803 m), weight 67.4 kg, SpO2 98 %.  Constitutional: No distress . Vital signs reviewed. HEENT: NCAT, EOMI, oral membranes moist, NGT Neck: [place] J collar Cardiovascular: RRR without murmur. No JVD    Respiratory/Chest: CTA Bilaterally without wheezes or rales. Normal effort    GI/Abdomen: BS +, non-tender, non-distended Ext: no clubbing, cyanosis, or edema Psych: pleasant and cooperative  Skin: wounds at knees, both hands, left elbow, right ankle.  Sacral wound present.  Foam dressings at based of collar Neuro: alert. Said he was in [place]. Couldn't recall that he was in hospital. Knew that I was a doctor. When I gave him some hints, he said my name was "Dr. Delena Bali". Followed basic commands.   Moves all 4 limbs spontaneously but difficut to grade.   Musculoskeletal: no focal jt pain, nl prom.     Assessment/Plan: 1. Functional deficits which require 3+ hours per day of interdisciplinary therapy in a comprehensive inpatient rehab setting. Physiatrist is providing close team supervision and 24 hour management of active medical problems listed below. Physiatrist and rehab team continue to assess barriers to discharge/monitor patient progress toward functional and medical goals  Care Tool:  Bathing        Body parts bathed by helper: Right arm, Left arm, Chest, Abdomen, Front perineal area, Buttocks, Right upper leg, Left upper leg, Right lower leg, Left lower leg, Face     Bathing assist Assist Level: 2 Helpers     Upper Body Dressing/Undressing Upper body dressing   What is the patient wearing?: Pull over shirt    Upper body  assist Assist Level: Dependent - Patient 0%    Lower Body Dressing/Undressing Lower body dressing            Lower body assist Assist for lower body dressing: 2 Helpers     Toileting Toileting    Toileting assist Assist for toileting: 2 Helpers     Transfers Chair/bed transfer  Transfers assist     Chair/bed transfer assist level: Moderate Assistance - Patient 50 - 74%     Locomotion Ambulation   Ambulation assist   Ambulation activity did not occur: Safety/medical concerns  Assist level: Moderate Assistance - Patient 50 - 74% Assistive device: Other (comment) (IV pole on L) Max distance: 200 ft   Walk 10 feet activity   Assist  Walk 10 feet activity did not occur: Safety/medical concerns  Assist level: Moderate Assistance - Patient - 50 - 74% Assistive device: No Device   Walk 50 feet  activity   Assist Walk 50 feet with 2 turns activity did not occur: Safety/medical concerns  Assist level: Moderate Assistance - Patient - 50 - 74%      Walk 150 feet activity   Assist Walk 150 feet activity did not occur: Safety/medical concerns  Assist level: Moderate Assistance - Patient - 50 - 74%      Walk 10 feet on uneven surface  activity   Assist Walk 10 feet on uneven surfaces activity did not occur: Safety/medical concerns         Wheelchair     Assist Is the patient using a wheelchair?: Yes Type of Wheelchair: Manual    Wheelchair assist level: Dependent - Patient 0% (TIS)      Wheelchair 50 feet with 2 turns activity    Assist    Wheelchair 50 feet with 2 turns activity did not occur: Safety/medical concerns       Wheelchair 150 feet activity     Assist  Wheelchair 150 feet activity did not occur: Safety/medical concerns       Blood pressure (!) 117/46, pulse (!) 101, temperature 97.6 F (36.4 C), resp. rate 14, height 5\' 11"  (1.803 m), weight 67.4 kg, SpO2 98 %.  Medical Problem List and Plan: 1. 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 Supervision goals  -Continue CIR therapies including PT, OT, and SLP.   -I have discussed prognosis with son. I spoke to his ex-wife as well regarding his prognosis and competency. 2.  Antithrombotics: -DVT/anticoagulation:  Pharmaceutical: Lovenox 30 mg BID             -antiplatelet therapy: aspirin 81 mg daily 3. Pain Management: Tylenol per tube scheduled,\; oxycodone, Robaxin prn 4. 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 Neuropsych/cognition: This patient is not capable of making decisions on his own behalf.  -telesitter, soft waist belt, mittens for safety            - Seroquel 25 mg scheduled qhs and qhs  PRN  -continue sleep chart   6. Skin/Wound Care: routine skin care checks             -monitor scalp lac/skin abrasions             -appreciate WOC recs for unstageable sacral/coccyx pressure injury   -medihoney/foam dressing 7. Fluids/Electrolytes/Nutrition/dysphagia: Strict Is and Os and follow-up chemistries             -MBS --diet initiated D2/thins  10/12-decided to keep cortrak in for now, decreased HS TF  -pt appears to have eaten a little more this morning  -discussed PO intake with pt. Demonstrated SOME insight  -BMET WNL  -added megace to help stimulate appetite on 10/12  -10/13--prealbumin only 13--->recheck next week   -ate 50% breakfast. Hopefully we'll see further increase   -recheck bmet monday 8: Right distal clavicle and humerus fracture:  -continue sling; follow-up with Dr. Marcelino Scot on Monday -NWB RUE, pendulum activiites  9: Right 1 st rib fracture: pneumothorax resolved; IS/FV 10: C5-C6, C4 and C7 transverse process fractures:  -continue cervical collar; follow-up with Dr Annette Stable 11: Possible right VA and ICA injuries: daily aspirin 13: Urinary retention: has indwelling Foley catheter             -Cardura started 9/25--stopped d/t hypotension  10/6 resumed cardura     10/8 -Restarted foley cath, has urinary retention and bleeding/irritation with IC  10/10 will need catheter at discharge 15: SAH:  completed 7 days of Keppra; follow-up with neurosurgery 16: Volume overload/peripheral edema: received lasix 40mg  x 1 last week             -strict Is and Os and daily weights              Filed Weights   02/01/22 0428 02/03/22 0500 02/04/22 0514  Weight: 64.4 kg 62.6 kg 67.4 kg    -10/13 weights have been inconsistent 17: AKI: see above 18: Elevated  transaminases: trending downward; follow-up CMP tomorrow 19:  ?aspiration pneumonia w/x hx of staph and pseudomonas - vancomycin and cefepime total of 7 more days ended 10/5 -continue robitussin             10/9 wbcs 7.7 20: SVT/sinus tach intermittently: monitor 21: Mediastinal hematoma: Echo WNL; Hgb stable 22: Ankle edema and ecchymosis: x-rays negative for osseous abnormality           Continue TEDS, elevation in bed 23. Loose stools. No s/s infectious diarrhea. Likely d/t abx and Tfs. Getting lomotil PRN.    -expect further improvement off TF (in addition to abx)  -10/8 Last BM  --sorbitol today 24. Normocytic anemia/ABLA: likely related to hospitalization and chronic disease  -no gross blood loss  -added b complex and iron  -continue to follow  -10/9 hgb 7.8  At least 50 total minutes were spent in examination of patient, assessment of pertinent data,  formulation of a treatment plan, and in discussion with patient and/or family.     LOS: 16 days A FACE TO FACE EVALUATION WAS PERFORMED  Meredith Staggers 02/04/2022, 8:56 AM

## 2022-02-05 LAB — GLUCOSE, CAPILLARY
Glucose-Capillary: 106 mg/dL — ABNORMAL HIGH (ref 70–99)
Glucose-Capillary: 111 mg/dL — ABNORMAL HIGH (ref 70–99)
Glucose-Capillary: 128 mg/dL — ABNORMAL HIGH (ref 70–99)
Glucose-Capillary: 67 mg/dL — ABNORMAL LOW (ref 70–99)
Glucose-Capillary: 76 mg/dL (ref 70–99)
Glucose-Capillary: 78 mg/dL (ref 70–99)
Glucose-Capillary: 96 mg/dL (ref 70–99)

## 2022-02-05 NOTE — Progress Notes (Signed)
Occupational Therapy TBI Note  Patient Details  Name: Dominic Smith MRN: [id number] Date of Birth: 03/02/1941  Today's Date: 02/05/2022 OT Individual Time: 1430-1535 OT Individual Time Calculation (min): 65 min    Short Term Goals: Week 3:  OT Short Term Goal 1 (Week 3): Pt will complete 1/3 steps of donning shirt OT Short Term Goal 2 (Week 3): Pt will compelte toilet transfer with MIN A consistently OT Short Term Goal 3 (Week 3): Pt will wash 75% of body with MOD cuing for sequencing OT Short Term Goal 4 (Week 3): Pt will complete 1/3 steps of dressing  Skilled Therapeutic Interventions/Progress Updates:    Patient received supine in bed.  Patient verbally agitated - "don't come in here - I am calling the cops - this place is a junk pile."  Patient not physically agitated.  Redirected patient and provided orientation.  Patient calmed.  When he heard voices in hallway - patient returned to confabulating story of drug dealers and prostitutes and needing to call police.  Again provided orientation, and patient calmed.  "I am in the hospital?"  Patient transferred to wheelchair and to sink in room to complete familiar functional tasks.   Patient frequently tearful during this session.  When confronted with a question he could not answer - he deferred with "let's leave that for now."  Patient returned to bed with all safety measures in place.     Therapy Documentation Precautions:  Precautions Precautions: Fall, Cervical Precaution Booklet Issued: No Precaution Comments: cortrak, bilateral soft mitts Required Braces or Orthoses: Sling, Cervical Brace Cervical Brace: At all times, Hard collar Restrictions Weight Bearing Restrictions: Yes RUE Weight Bearing: Non weight bearing Other Position/Activity Restrictions: in sling, nonoperative mgmt per ortho 9/18  Pain:  Denies pain Agitated Behavior Scale: TBI Observation Details Observation Environment: CIR Start of observation period  - Date: 02/05/22 Start of observation period - Time: 1430 End of observation period - Date: 02/05/22 End of observation period - Time: 1535 Agitated Behavior Scale (DO NOT LEAVE BLANKS) Short attention span, easy distractibility, inability to concentrate: Present to a moderate degree Impulsive, impatient, low tolerance for pain or frustration: Present to a slight degree Uncooperative, resistant to care, demanding: Absent Violent and/or threatening violence toward people or property: Absent Explosive and/or unpredictable anger: Absent Rocking, rubbing, moaning, or other self-stimulating behavior: Absent Pulling at tubes, restraints, etc.: Present to a slight degree Wandering from treatment areas: Absent Restlessness, pacing, excessive movement: Absent Repetitive behaviors, motor, and/or verbal: Absent Rapid, loud, or excessive talking: Absent Sudden changes of mood: Present to a slight degree Easily initiated or excessive crying and/or laughter: Present to a slight degree Self-abusiveness, physical and/or verbal: Absent Agitated behavior scale total score: 20      Therapy/Group: Individual Therapy  Mariah Milling 02/05/2022, 3:48 PM

## 2022-02-05 NOTE — Progress Notes (Signed)
Physical Therapy Session Note  Patient Details  Name: [name] MRN: [id number] Date of Birth: 09/17/1940  Today's Date: 02/05/2022 PT Individual Time: 9558-3167 PT Individual Time Calculation (min): 60 min   Short Term Goals: Week 2:  PT Short Term Goal 1 (Week 2): Patient will perform basic transfers with min A >50% of the time using LRAD. PT Short Term Goal 1 - Progress (Week 2): Met PT Short Term Goal 2 (Week 2): Patient will perform bed mobility with min A >50% of the time. PT Short Term Goal 2 - Progress (Week 2): Progressing toward goal PT Short Term Goal 3 (Week 2): Patient will ambulate >200 ft with min A using LRAD. PT Short Term Goal 3 - Progress (Week 2): Met PT Short Term Goal 4 (Week 2): Patient will initiate stair training. PT Short Term Goal 4 - Progress (Week 2): Met  Skilled Therapeutic Interventions/Progress Updates: Pt presents supine in bed and agreeable to therapy.  Pt appears more alert and participatory with therapy today.  PT donned THT and ace wraps to B LE w/ total A although raising legs when asked.  Pt transferred sup to sit w/ mod A and verbal cues after initiating by bringing legs off EOB.  Pt bp monitored in supine at 128/68 and then in sitting at 122/73.  Pt transferred sit to stand w/ mod A but initiated sit to stand w/ proper placement of LUE.  Standing BP of 100/57 w/o c/o.  Pt amb multiple trials of 180' w/ mod A and L HHA, but improved BOS and direction finding.  BP after amb was 114/62.  Nursing notified of all BP measurements.  Pt reclined back in TIS and left in nursing station for observation and social interaction.       Therapy Documentation Precautions:  Precautions Precautions: Fall, Cervical Precaution Booklet Issued: No Precaution Comments: cortrak, bilateral soft mitts Required Braces or Orthoses: Sling, Cervical Brace Cervical Brace: At all times, Hard collar Restrictions Weight Bearing Restrictions: Yes RUE Weight Bearing: Non  weight bearing Other Position/Activity Restrictions: in sling, nonoperative mgmt per ortho 9/18 General:   Vital Signs:  Pain:appears pain-free.      Therapy/Group: Individual Therapy  [name] 02/05/2022, 9:50 AM

## 2022-02-05 NOTE — Progress Notes (Signed)
PROGRESS NOTE   Subjective/Complaints: No new complaints this morning Tolerated PT, ambulated well! BP remained stable during session Denies pain  ROS: Limited due to cognitive/behavioral    Objective:   No results found.  Recent Labs    02/04/22 0600  WBC 7.3  HGB 8.3*  HCT 25.4*  PLT 321     Recent Labs    02/03/22 0542  NA 141  K 4.1  CL 105  CO2 25  GLUCOSE 107*  BUN 19  CREATININE 0.96  CALCIUM 8.6*     Intake/Output Summary (Last 24 hours) at 02/05/2022 1503 Last data filed at 02/05/2022 0422 Gross per 24 hour  Intake --  Output 650 ml  Net -650 ml      Pressure Injury 01/13/22 Coccyx Mid Unstageable - Full thickness tissue loss in which the base of the injury is covered by slough (yellow, tan, gray, green or brown) and/or eschar (tan, brown or black) in the wound bed. skin is broken with redness at the (Active)  01/13/22 1930  Location: Coccyx  Location Orientation: Mid  Staging: Unstageable - Full thickness tissue loss in which the base of the injury is covered by slough (yellow, tan, gray, green or brown) and/or eschar (tan, brown or black) in the wound bed.  Wound Description (Comments): skin is broken with redness at the base  Present on Admission: Yes (present on admission to rehab 9/27)    Physical Exam: Vital Signs Blood pressure 115/64, pulse 95, temperature 98.2 F (36.8 C), temperature source Oral, resp. rate 18, height 5\' 11"  (1.803 m), weight 59.9 kg, SpO2 100 %.  Gen: no distress, normal appearing HEENT: oral mucosa pink and moist, NCAT Cardio: Reg rate Chest: normal effort, normal rate of breathing Abd: soft, non-distended Ext: no edema  Skin: wounds at knees, both hands, left elbow, right ankle.  Sacral wound present. Foam dressings at based of collar Neuro: alert. Said he was in [place]. Couldn't recall that he was in hospital. Knew that I was a doctor. When I gave him  some hints, he said my name was "Dr. Delena Bali". Followed basic commands.   Moves all 4 limbs spontaneously but difficut to grade.   Musculoskeletal: no focal jt pain, nl prom.     Assessment/Plan: 1. Functional deficits which require 3+ hours per day of interdisciplinary therapy in a comprehensive inpatient rehab setting. Physiatrist is providing close team supervision and 24 hour management of active medical problems listed below. Physiatrist and rehab team continue to assess barriers to discharge/monitor patient progress toward functional and medical goals  Care Tool:  Bathing        Body parts bathed by helper: Right arm, Left arm, Chest, Abdomen, Front perineal area, Buttocks, Right upper leg, Left upper leg, Right lower leg, Left lower leg, Face     Bathing assist Assist Level: 2 Helpers     Upper Body Dressing/Undressing Upper body dressing   What is the patient wearing?: Pull over shirt    Upper body assist Assist Level: Dependent - Patient 0%    Lower Body Dressing/Undressing Lower body dressing            Lower body assist  Assist for lower body dressing: 2 Helpers     Toileting Toileting    Toileting assist Assist for toileting: 2 Helpers     Transfers Chair/bed transfer  Transfers assist     Chair/bed transfer assist level: Moderate Assistance - Patient 50 - 74%     Locomotion Ambulation   Ambulation assist   Ambulation activity did not occur: Safety/medical concerns  Assist level: Moderate Assistance - Patient 50 - 74% Assistive device: Hand held assist Max distance: 180   Walk 10 feet activity   Assist  Walk 10 feet activity did not occur: Safety/medical concerns  Assist level: Moderate Assistance - Patient - 50 - 74% Assistive device: Hand held assist   Walk 50 feet activity   Assist Walk 50 feet with 2 turns activity did not occur: Safety/medical concerns  Assist level: Moderate Assistance - Patient - 50 - 74% Assistive  device: Hand held assist    Walk 150 feet activity   Assist Walk 150 feet activity did not occur: Safety/medical concerns  Assist level: Moderate Assistance - Patient - 50 - 74% Assistive device: Hand held assist    Walk 10 feet on uneven surface  activity   Assist Walk 10 feet on uneven surfaces activity did not occur: Safety/medical concerns         Wheelchair     Assist Is the patient using a wheelchair?: Yes Type of Wheelchair: Manual    Wheelchair assist level: Dependent - Patient 0% (TIS)      Wheelchair 50 feet with 2 turns activity    Assist    Wheelchair 50 feet with 2 turns activity did not occur: Safety/medical concerns       Wheelchair 150 feet activity     Assist  Wheelchair 150 feet activity did not occur: Safety/medical concerns       Blood pressure 115/64, pulse 95, temperature 98.2 F (36.8 C), temperature source Oral, resp. rate 18, height 5\' 11"  (1.803 m), weight 59.9 kg, SpO2 100 %.  Medical Problem List and Plan: 1. 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 Supervision goals  -Continue CIR therapies including PT, OT, and SLP.   -I have discussed prognosis with son. I spoke to his ex-wife as well regarding his prognosis and competency. 2.  Antithrombotics: -DVT/anticoagulation:  Pharmaceutical: Lovenox 30 mg BID             -antiplatelet therapy: aspirin 81 mg daily 3. Pain Management: continue Tylenol per tube scheduled,\; oxycodone, Robaxin prn 4. 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 Neuropsych/cognition: This patient is not  capable of making decisions on his own behalf.  -telesitter, soft waist belt, mittens for safety            - continue Seroquel 25 mg scheduled qhs and qhs PRN  -continue sleep chart   6. Skin/Wound Care: routine skin care checks             -  monitor scalp lac/skin abrasions             -appreciate WOC recs for unstageable sacral/coccyx pressure injury   -continue medihoney/foam dressing 7. Fluids/Electrolytes/Nutrition/dysphagia: Strict Is and Os and follow-up chemistries             -MBS --diet initiated D2/thins  10/12-decided to keep cortrak in for now, decreased HS TF  -pt appears to have eaten a little more this morning  -discussed PO intake with pt. Demonstrated SOME insight  -BMET WNL  -added megace to help stimulate appetite on 10/12  -10/13--prealbumin only 13--->recheck next week   -ate 50% breakfast. Hopefully we'll see further increase   -recheck bmet monday 8: Right distal clavicle and humerus fracture:  -continue sling; follow-up with Dr. Marcelino Scot on Monday -NWB RUE, pendulum activiites  9: Right 1 st rib fracture: pneumothorax resolved; IS/FV 10: C5-C6, C4 and C7 transverse process fractures:  -continue cervical collar; follow-up with Dr Annette Stable 11: Possible right VA and ICA injuries: continue daily aspirin 13: Urinary retention: has indwelling Foley catheter             -Cardura started 9/25--stopped d/t hypotension  10/6 resumed cardura     10/8 -Restarted foley cath, has urinary retention and bleeding/irritation with IC  10/10 will need catheter at discharge 15: SAH:  completed 7 days of Keppra; follow-up with neurosurgery 16: Volume overload/peripheral edema: received lasix 40mg  x 1 last week             -strict Is and Os and daily weights              Filed Weights   02/03/22 0500 02/04/22 0514 02/05/22 0422  Weight: 62.6 kg 67.4 kg 59.9 kg    -10/14 weight down 17: AKI: see above 18: Elevated transaminases: trending downward; follow-up CMP tomorrow 19:   ?aspiration pneumonia w/x hx of staph and pseudomonas - vancomycin and cefepime total of 7 more days ended 10/5 -continue robitussin             10/9 wbcs 7.7 20: SVT/sinus tach intermittently: monitor 21: Mediastinal hematoma: Echo WNL; Hgb stable 22: Ankle edema and ecchymosis: x-rays negative for osseous abnormality           Continue TEDS, elevation in bed 23. Loose stools. No s/s infectious diarrhea. Likely d/t abx and Tfs. Getting lomotil PRN.    -expect further improvement off TF (in addition to abx)  -10/8 Last BM  --sorbitol today 24. Normocytic anemia/ABLA: likely related to hospitalization and chronic disease  -no gross blood loss  -added b complex and iron  -continue to follow  -10/9 hgb 7.8     LOS: 17 days A FACE TO FACE EVALUATION WAS PERFORMED  Dominic Smith 02/05/2022, 3:03 PM

## 2022-02-05 NOTE — Progress Notes (Signed)
Hypoglycemic Event  CBG: 67  Treatment: 4 oz juice/soda  Symptoms: Sweaty  Follow-up CBG: [id number] CBG Result:[id number]   Possible Reasons for Event: Inadequate meal intake  Comments/MD notified:Dr. Raulkar notified at 1646 increase length of time giving patient osmolite 1.5 to 5pm to 6 am overnight     Merwyn Katos, RN

## 2022-02-06 LAB — GLUCOSE, CAPILLARY
Glucose-Capillary: 106 mg/dL — ABNORMAL HIGH (ref 70–99)
Glucose-Capillary: 85 mg/dL (ref 70–99)
Glucose-Capillary: 93 mg/dL (ref 70–99)
Glucose-Capillary: 87 mg/dL (ref 70–99)
Glucose-Capillary: 102 mg/dL — ABNORMAL HIGH (ref 70–99)

## 2022-02-06 MED ORDER — OSMOLITE 1.5 CAL PO LIQD
780.0000 mL | ORAL | Status: DC
  Administered 2022-02-06: 780 mL

## 2022-02-06 MED ORDER — POLYETHYLENE GLYCOL 3350 17 G PO PACK
17.0000 g | PACK | Freq: Every day | ORAL | Status: DC
  Administered 2022-02-06 – 2022-02-07 (×2): 17 g via ORAL
  Filled 2022-02-06 (×2): qty 1

## 2022-02-06 NOTE — Progress Notes (Addendum)
PROGRESS NOTE   Subjective/Complaints: Received notification from [place] that patient's glucose dropped to 67 yesterday afternoon- discussed starting tube feeds at 5pm rather than 6pm, Cbg 86 this AM Patient feels well, no complaints  ROS: Limited due to cognitive/behavioral    Objective:   No results found.  Recent Labs    02/04/22 0600  WBC 7.3  HGB 8.3*  HCT 25.4*  PLT 321     No results for input(s): "NA", "K", "CL", "CO2", "GLUCOSE", "BUN", "CREATININE", "CALCIUM" in the last 72 hours.    Intake/Output Summary (Last 24 hours) at 02/06/2022 0940 Last data filed at 02/06/2022 [id number] Gross per 24 hour  Intake 1160 ml  Output 2375 ml  Net -1215 ml      Pressure Injury 01/13/22 Coccyx Mid Unstageable - Full thickness tissue loss in which the base of the injury is covered by slough (yellow, tan, gray, green or brown) and/or eschar (tan, brown or black) in the wound bed. skin is broken with redness at the (Active)  01/13/22 1930  Location: Coccyx  Location Orientation: Mid  Staging: Unstageable - Full thickness tissue loss in which the base of the injury is covered by slough (yellow, tan, gray, green or brown) and/or eschar (tan, brown or black) in the wound bed.  Wound Description (Comments): skin is broken with redness at the base  Present on Admission: Yes (present on admission to rehab 9/27)    Physical Exam: Vital Signs Blood pressure (!) 140/71, pulse 92, temperature 97.6 F (36.4 C), resp. rate 14, height 5\' 11"  (1.803 m), weight 59 kg, SpO2 100 %.  Gen: no distress, normal appearing HEENT: oral mucosa pink and moist, NCAT Cardio: Reg rate Chest: normal effort, normal rate of breathing Abd: soft, non-distended Ext: no edema Psych: pleasant, normal affect   Skin: wounds at knees, both hands, left elbow, right ankle.  Sacral wound present. Foam dressings at based of collar Neuro: alert. Said he was in  [place]. Couldn't recall that he was in hospital. Knew that I was a doctor. When I gave him some hints, he said my name was "Dr. Delena Bali". Followed basic commands.   Moves all 4 limbs spontaneously but difficut to grade.   Musculoskeletal: no focal jt pain, nl prom.     Assessment/Plan: 1. Functional deficits which require 3+ hours per day of interdisciplinary therapy in a comprehensive inpatient rehab setting. Physiatrist is providing close team supervision and 24 hour management of active medical problems listed below. Physiatrist and rehab team continue to assess barriers to discharge/monitor patient progress toward functional and medical goals  Care Tool:  Bathing        Body parts bathed by helper: Right arm, Left arm, Chest, Abdomen, Front perineal area, Buttocks, Right upper leg, Left upper leg, Right lower leg, Left lower leg, Face     Bathing assist Assist Level: 2 Helpers     Upper Body Dressing/Undressing Upper body dressing   What is the patient wearing?: Pull over shirt    Upper body assist Assist Level: Dependent - Patient 0%    Lower Body Dressing/Undressing Lower body dressing  Lower body assist Assist for lower body dressing: 2 Helpers     Toileting Toileting    Toileting assist Assist for toileting: 2 Helpers     Transfers Chair/bed transfer  Transfers assist     Chair/bed transfer assist level: Moderate Assistance - Patient 50 - 74%     Locomotion Ambulation   Ambulation assist   Ambulation activity did not occur: Safety/medical concerns  Assist level: Moderate Assistance - Patient 50 - 74% Assistive device: Hand held assist Max distance: 180   Walk 10 feet activity   Assist  Walk 10 feet activity did not occur: Safety/medical concerns  Assist level: Moderate Assistance - Patient - 50 - 74% Assistive device: Hand held assist   Walk 50 feet activity   Assist Walk 50 feet with 2 turns activity did not occur:  Safety/medical concerns  Assist level: Moderate Assistance - Patient - 50 - 74% Assistive device: Hand held assist    Walk 150 feet activity   Assist Walk 150 feet activity did not occur: Safety/medical concerns  Assist level: Moderate Assistance - Patient - 50 - 74% Assistive device: Hand held assist    Walk 10 feet on uneven surface  activity   Assist Walk 10 feet on uneven surfaces activity did not occur: Safety/medical concerns         Wheelchair     Assist Is the patient using a wheelchair?: Yes Type of Wheelchair: Manual    Wheelchair assist level: Dependent - Patient 0% (TIS)      Wheelchair 50 feet with 2 turns activity    Assist    Wheelchair 50 feet with 2 turns activity did not occur: Safety/medical concerns       Wheelchair 150 feet activity     Assist  Wheelchair 150 feet activity did not occur: Safety/medical concerns       Blood pressure (!) 140/71, pulse 92, temperature 97.6 F (36.4 C), resp. rate 14, height 5\' 11"  (1.803 m), weight 59 kg, SpO2 100 %.  Medical Problem List and Plan: 1. 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 Supervision goals  -Continue CIR therapies including PT, OT, and SLP.   -I have discussed prognosis with son. I spoke to his ex-wife as well regarding his prognosis and competency. 2.  Antithrombotics: -DVT/anticoagulation:  Pharmaceutical: Lovenox 30 mg BID             -antiplatelet therapy: aspirin 81 mg daily 3. Pain Management: continue Tylenol per tube scheduled,\; oxycodone, Robaxin prn 4. 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 Neuropsych/cognition: This patient is not capable of making decisions on his own behalf.  -telesitter, soft waist belt, mittens for safety            - continue Seroquel 25 mg scheduled qhs and qhs PRN  -continue sleep chart   6. Skin/Wound Care: routine skin care checks             -  monitor scalp lac/skin abrasions             -appreciate WOC recs for unstageable sacral/coccyx pressure injury   -continue medihoney/foam dressing 7. Fluids/Electrolytes/Nutrition/dysphagia: Strict Is and Os and follow-up chemistries             -MBS --diet initiated D2/thins  10/12-decided to keep cortrak in for now, decreased HS TF  -pt appears to have eaten a little more this morning  -discussed PO intake with pt. Demonstrated SOME insight  -BMET WNL  -added megace to help stimulate appetite on 10/12  -10/13--prealbumin only 13--->recheck next week   -ate 50% breakfast. Hopefully we'll see further increase   -recheck bmet monday 8: Right distal clavicle and humerus fracture:  -continue sling; follow-up with Dr. Marcelino Scot on Monday -NWB RUE, pendulum activiites  9: Right 1 st rib fracture: pneumothorax resolved; IS/FV 10: C5-C6, C4 and C7 transverse process fractures:  -continue cervical collar; follow-up with Dr Annette Stable 11: Possible right VA and ICA injuries: continue daily aspirin 13: Urinary retention: has indwelling Foley catheter             -Cardura started 9/25--stopped d/t hypotension  10/6 resumed cardura     10/8 -Restarted foley cath, has urinary retention and bleeding/irritation with IC  10/10 will need catheter at discharge 15: SAH:  completed 7 days of Keppra; follow-up with neurosurgery 16: Volume overload/peripheral edema: received lasix 40mg  x 1 last week             -strict Is and Os and daily weights              Filed Weights   02/05/22 0422 02/06/22 0512 02/06/22 0544  Weight: 59.9 kg 66.7 kg 59 kg    -10/14-10/15 weight down 17: AKI: see above 18:  Elevated transaminases: trending downward; follow-up CMP tomorrow 19:  ?aspiration pneumonia w/x hx of staph and pseudomonas - vancomycin and cefepime total of 7 more days ended 10/5 -continue robitussin             10/9 wbcs 7.7 20: SVT/sinus tach intermittently: monitor 21: Mediastinal hematoma: Echo WNL; Hgb stable 22: Ankle edema and ecchymosis: x-rays negative for osseous abnormality           Continue TEDS, elevation in bed 23. Loose stools. No s/s infectious diarrhea. Likely d/t abx and Tfs. Getting lomotil PRN.    -expect further improvement off TF (in addition to abx)  -10/8 Last BM  --sorbitol today  10/15: messaged nursing to determine last BM, was 12th, will add miralax daily 24. Normocytic anemia/ABLA: likely related to hospitalization and chronic disease  -no gross blood loss  continue b complex and iron  -continue to follow  -10/9 hgb 7.8 25. Hypoglycemia: changed order for TF to start at 5pm     LOS: 18 days A FACE TO [place] 02/06/2022, 9:40 AM

## 2022-02-06 NOTE — Progress Notes (Signed)
Physical Therapy TBI Note  Patient Details  Name: Dominic Smith MRN: [id number] Date of Birth: 1941/04/23  Today's Date: 02/06/2022 PT Individual Time: [id number] PT Individual Time Calculation (min): 45 min   Short Term Goals: Week 3:  PT Short Term Goal 1 (Week 3): Patient will perform bed mobility with min A >50% of the time. PT Short Term Goal 2 (Week 3): Patient will perform basic transfers with min A consistently. PT Short Term Goal 3 (Week 3): Patient will ambulate >300 ft with min A without need for additional assist for LOB. PT Short Term Goal 4 (Week 3): Patient will participate in a formal balance assessment.  Skilled Therapeutic Interventions/Progress Updates:     Patient sideways and restless with c-collar and R arm sling in place in bed with RN in the room upon PT arrival. Patient alert and agreeable to PT session. Patient with reduced frustration tolerance and mild verbal agitation while perseverating on needing shoes to walk this morning. PT able to reorient patient and redirect him with reduced agitation and perseveration. Patient reported he recognized PT and appreciated education and reorientation at beginning of session to reduce patient confusion.   Patient continued to perseverate on needing shoes, will follow-up with family when able.   Patient reported 8/10 L arm pain with mobility with nursing staff, reported 5/10 during session, RN made aware. PT provided repositioning, rest breaks, and distraction as pain interventions throughout session.   Therapeutic Activity: Bed Mobility: Patient performed supine sit with mod A due to positioning. Provided verbal cues for initiation and support for trunk and neck to maintain cervical precautions as much as able with patient laying horizontally in the bed. Transfers: Patient performed stand pivot bed>TIS w/c with min A with L HHA. Donned B thigh high TED hose, abdominal binder, and L ACE wraps, no ACE wraps on unit to apply to  R side, charge nurse ordered more during session. BP WFL prior to mobility. Patient performed sit to/from stand with min A with L HHA. Provided verbal cues for initiation with reduced delay in initiation compared to last week.  Gait Training:  Patient ambulated ~150 feet using L HHA with min A. Ambulated with slow and shuffling gait initially, improved with facilitation for weight shifting and repetition, continued to ambulate with decreased gait speed, decreased step length and height, narrow BOS with scissoring x2 on turns, and mild forward trunk lean. Provided verbal cues for erect posture, increased gait speed, increased weight shift bilaterally, and increased step height. Patient requested to sit due to sudden onset of light headedness and fatigue.   Patient sat on EOM with minimal improvement in symptoms. Patient assisted to lying on mat with B lower extremities elevated on a wedge.   Orthostatic Vitals: Supine: BP 139/86, HR 89  Sitting: BP 109/74, HR 101 Standing: BP 99/68, HR 105  Patient returned to the TIS from the mat with min A to sit up to the L and min A for stand pivot. Limited mobility for remainder of session due to symptomatic orthostasis.   Patient in [place] w/c handed off to RN and nursing students for 1-1 supervision at end of session with breaks locked, seat belt alarm set, B hand mitts donned, and all needs within reach.   Therapy Documentation Precautions:  Precautions Precautions: Fall, Cervical Precaution Booklet Issued: No Precaution Comments: cortrak, bilateral soft mitts Required Braces or Orthoses: Sling, Cervical Brace Cervical Brace: At all times, Hard collar Restrictions Weight Bearing Restrictions: Yes RUE Weight Bearing:  Non weight bearing Other Position/Activity Restrictions: in sling, nonoperative mgmt per ortho 9/18 Agitated Behavior Scale: TBI Observation Details Observation Environment: Cir Start of observation period - Date: 02/06/22 Start of  observation period - Time: 0845 End of observation period - Date: 02/06/22 End of observation period - Time: 0930 Agitated Behavior Scale (DO NOT LEAVE BLANKS) Short attention span, easy distractibility, inability to concentrate: Present to a slight degree Impulsive, impatient, low tolerance for pain or frustration: Present to a moderate degree Uncooperative, resistant to care, demanding: Absent Violent and/or threatening violence toward people or property: Absent Explosive and/or unpredictable anger: Absent Rocking, rubbing, moaning, or other self-stimulating behavior: Absent Pulling at tubes, restraints, etc.: Absent Wandering from treatment areas: Absent Restlessness, pacing, excessive movement: Present to a moderate degree Repetitive behaviors, motor, and/or verbal: Present to a moderate degree Rapid, loud, or excessive talking: Present to a slight degree Sudden changes of mood: Absent Easily initiated or excessive crying and/or laughter: Absent Self-abusiveness, physical and/or verbal: Absent Agitated behavior scale total score: 22     Therapy/Group: Individual Therapy  Elchonon Maxson L Daeron Carreno PT, DPT, NCS, CBIS  02/06/2022, 4:58 PM

## 2022-02-06 NOTE — Progress Notes (Signed)
Occupational Therapy TBI Note  Patient Details  Name: Dominic Smith MRN: [id number] Date of Birth: [birth year]  Today's Date: 02/06/2022 OT Individual Time:  -       Short Term Goals: Week 3:  OT Short Term Goal 1 (Week 3): Pt will complete 1/3 steps of donning shirt OT Short Term Goal 2 (Week 3): Pt will compelte toilet transfer with MIN A consistently OT Short Term Goal 3 (Week 3): Pt will wash 75% of body with MOD cuing for sequencing OT Short Term Goal 4 (Week 3): Pt will complete 1/3 steps of dressing  Skilled Therapeutic Interventions/Progress Updates:  Pt greeted at nurses station ready to return to room. Pt transported back to room in w/c with total A, pt completed ambulatory toilet transfer with MIN HHA. Pt reports need to void bowels however unable to complete task despite increased time on toilet. Pt noted to pull at foley and reach for excess toilet paper needing gentle redirection. Pt trying to put toilet paper in toilet however putting it in his brief, redirected toilet paper to toilet for safety.  Pt ambulated out of bathroom with MIN HHA, pt easily distracted by items on floor and asking therapist to shut his blinds, gentle redirection to EOB. Pt needed MOD A to elevate BLEs back to bed. Once in supine pt reports "I'm ready to go to sleep."  Pt left supine in bed with bilateral mitts applied,  waist restraint and bed alarm activated, telesitter in place,call bell within reach.   Therapy Documentation Precautions:  Precautions Precautions: Fall, Cervical Precaution Booklet Issued: No Precaution Comments: cortrak, bilateral soft mitts Required Braces or Orthoses: Sling, Cervical Brace Cervical Brace: At all times, Hard collar Restrictions Weight Bearing Restrictions: Yes RUE Weight Bearing: Non weight bearing Other Position/Activity Restrictions: in sling, nonoperative mgmt per ortho 9/18  Pain: No pain    Agitated Behavior Scale: TBI Observation  Details Observation Environment: Cir Start of observation period - Date: 02/06/22 Start of observation period - Time: 1555 End of observation period - Date: 02/06/22 End of observation period - Time: 1533 Agitated Behavior Scale (DO NOT LEAVE BLANKS) Short attention span, easy distractibility, inability to concentrate: Present to a moderate degree Impulsive, impatient, low tolerance for pain or frustration: Present to a slight degree Uncooperative, resistant to care, demanding: Absent Violent and/or threatening violence toward people or property: Absent Explosive and/or unpredictable anger: Absent Rocking, rubbing, moaning, or other self-stimulating behavior: Absent Pulling at tubes, restraints, etc.: Present to a slight degree Wandering from treatment areas: Absent Restlessness, pacing, excessive movement: Present to a slight degree Repetitive behaviors, motor, and/or verbal: Present to a slight degree Rapid, loud, or excessive talking: Absent Sudden changes of mood: Absent Easily initiated or excessive crying and/or laughter: Absent Self-abusiveness, physical and/or verbal: Absent Agitated behavior scale total score: 20    Therapy/Group: Individual Therapy  Precious Haws 02/06/2022, 3:55 PM

## 2022-02-07 LAB — CBC
HCT: 26.2 % — ABNORMAL LOW (ref 39.0–52.0)
Hemoglobin: 8.1 g/dL — ABNORMAL LOW (ref 13.0–17.0)
MCH: 28.3 pg (ref 26.0–34.0)
MCHC: 30.9 g/dL (ref 30.0–36.0)
MCV: 91.6 fL (ref 80.0–100.0)
Platelets: 331 10*3/uL (ref 150–400)
RBC: 2.86 MIL/uL — ABNORMAL LOW (ref 4.22–5.81)
RDW: 13.4 % (ref 11.5–15.5)
WBC: 9.3 10*3/uL (ref 4.0–10.5)
nRBC: 0 % (ref 0.0–0.2)

## 2022-02-07 LAB — BASIC METABOLIC PANEL
Anion gap: 8 (ref 5–15)
BUN: 26 mg/dL — ABNORMAL HIGH (ref 8–23)
CO2: 24 mmol/L (ref 22–32)
Calcium: 8.6 mg/dL — ABNORMAL LOW (ref 8.9–10.3)
Chloride: 109 mmol/L (ref 98–111)
Creatinine, Ser: 1.22 mg/dL (ref 0.61–1.24)
GFR, Estimated: 59 mL/min — ABNORMAL LOW (ref >60)
Glucose, Bld: 96 mg/dL (ref 70–99)
Potassium: 4.3 mmol/L (ref 3.5–5.1)
Sodium: 141 mmol/L (ref 135–145)

## 2022-02-07 LAB — GLUCOSE, CAPILLARY
Glucose-Capillary: 104 mg/dL — ABNORMAL HIGH (ref 70–99)
Glucose-Capillary: 106 mg/dL — ABNORMAL HIGH (ref 70–99)
Glucose-Capillary: 121 mg/dL — ABNORMAL HIGH (ref 70–99)
Glucose-Capillary: 83 mg/dL (ref 70–99)
Glucose-Capillary: 88 mg/dL (ref 70–99)
Glucose-Capillary: 92 mg/dL (ref 70–99)

## 2022-02-07 MED ORDER — QUETIAPINE FUMARATE 50 MG PO TABS: 50.0000 mg | ORAL_TABLET | Freq: Every day | ORAL | Status: DC

## 2022-02-07 MED ORDER — LORAZEPAM 2 MG/ML IJ SOLN
1.0000 mg | Freq: Once | INTRAMUSCULAR | Status: AC
  Administered 2022-02-07: 1 mg via INTRAVENOUS
  Filled 2022-02-07: qty 1

## 2022-02-07 MED ORDER — SORBITOL 70 % SOLN: 60.0000 mL | Freq: Once | Status: DC

## 2022-02-07 MED ORDER — GUAIFENESIN 100 MG/5ML PO LIQD
10.0000 mL | Freq: Four times a day (QID) | ORAL | Status: DC | PRN
  Administered 2022-02-07: 10 mL
  Filled 2022-02-07: qty 10

## 2022-02-07 MED ORDER — POLYETHYLENE GLYCOL 3350 17 G PO PACK
17.0000 g | PACK | Freq: Every day | ORAL | Status: DC
  Administered 2022-02-08 – 2022-03-08 (×27): 17 g
  Filled 2022-02-07 (×28): qty 1

## 2022-02-07 MED ORDER — OSMOLITE 1.5 CAL PO LIQD
360.0000 mL | ORAL | Status: DC
  Administered 2022-02-07: 360 mL
  Filled 2022-02-07 (×2): qty 1000

## 2022-02-07 MED ORDER — SORBITOL 70 % SOLN
60.0000 mL | Freq: Once | Status: AC
  Administered 2022-02-07: 60 mL
  Filled 2022-02-07: qty 60

## 2022-02-07 MED ORDER — QUETIAPINE FUMARATE 25 MG PO TABS
25.0000 mg | ORAL_TABLET | Freq: Once | ORAL | Status: AC
  Administered 2022-02-07: 25 mg via ORAL
  Filled 2022-02-07: qty 1

## 2022-02-07 MED ORDER — QUETIAPINE FUMARATE 25 MG PO TABS: 25.0000 mg | ORAL_TABLET | Freq: Every day | ORAL | Status: DC

## 2022-02-07 MED ORDER — FREE WATER
200.0000 mL | Freq: Four times a day (QID) | Status: DC
  Administered 2022-02-07 – 2022-02-10 (×15): 200 mL

## 2022-02-07 MED ORDER — QUETIAPINE FUMARATE 50 MG PO TABS
50.0000 mg | ORAL_TABLET | Freq: Every day | ORAL | Status: DC
  Administered 2022-02-07 – 2022-02-08 (×2): 50 mg
  Filled 2022-02-07 (×2): qty 1
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
## 2022-02-07 NOTE — Progress Notes (Signed)
Physical Therapy TBI Note  Patient Details  Name: Dominic Smith MRN: [id number] Date of Birth: [birth year]  Today's Date: 02/07/2022 PT Individual Time: 2952-8413 and 1345-1445 PT Individual Time Calculation (min): 60 min and 60 min  Short Term Goals: Week 3:  PT Short Term Goal 1 (Week 3): Patient will perform bed mobility with min A >50% of the time. PT Short Term Goal 2 (Week 3): Patient will perform basic transfers with min A consistently. PT Short Term Goal 3 (Week 3): Patient will ambulate >300 ft with min A without need for additional assist for LOB. PT Short Term Goal 4 (Week 3): Patient will participate in a formal balance assessment.  Skilled Therapeutic Interventions/Progress Updates:     Session 1: Patient in TIS w/c at the nurses station with c-collar donned, B mitts donned, and seat belt alarm set upon PT arrival. Patient alert and agreeable to PT session. Patient reported un-rated R arm pain during session, RN made aware. PT provided repositioning, rest breaks, and distraction as pain interventions throughout session. Perseverated on staff pulling on his R arm "all the time" able to redirect with orientation.  While removing B hand mitts, noted patient with new skin tears to R hand with foam dressing and deep purple bruising to his L hand from attempts to remove his mitts. RN and charge nurse alerted. Patient without signs of pulling at NG tube or c-collar with staff. Agreed on trial without mitts at end of session to preserve patient's skin integrity.   B thigh high TEDs and abdominal binder donned prior to session. Adjusted R arm sling and abdominal binder during session.  Orthostatic Vitals: Sitting: BP 111/75, HR 99 Standing: BP 89/57, HR 115  Patient transported to his room to return to bed due to hypotension. Patient performed stand pivot with min A with R HHA with cues for initiation and sequencing of stepping to turn. He then performed sit to supine with max A for  trunk and lower extremity control and scooting up in bed with max A with patient able to follow cues for pushing through his feet in hook-lying to assist with scooting up.   Patient quickly fell asleep once in supine. Patient missed 15 min of skilled PT due to fatigue and hypotension, RN made aware. Will attempt to make-up missed time as able.   Vitals in supine: BP 128/77, HR 88  Patient in bed with c-collar in place, R arm sling secure, soft waist belt secure, and B mitts off per discussing with nursing staff, see above, at end of session with breaks locked, bed alarm set, 4 rails up for patient safety, B floor mats in place, and telesitter in place, and all needs within reach.   Session 2: Patient in bed upon PT arrival. Patient alert and restless reaching for the velcro of his c-collar (still donned) and agreeable to PT session. Patient denied pain during session, did report feeling hungry for a "mayonnaise sandwich" and discomfort of c-collar. PT adjusted fit of c-collar for increased support with patient in supine with cues to maintain still at midline, patient followed cues long enough for adjustment.   PT applied B thigh high ACE wraps due to hypotension in standing this morning. Unable to attain BP readings due to patient restless and eager for a snack.   Therapeutic Activity: Bed Mobility: Patient performed supine to sit with mod A for trunk and lower extremity control. Provided verbal cues for initiation and log rolling. Transfers: Patient performed stand pivot  bed>TIS w/c with min A and increased time for initiation. Provided verbal cues for initiation and sequencing.  Problem solved maintaining patient's skin integrity with B mitts donned due to restlessness and patient attempting to remove c-collar at beginning of session. Cut x2 pieces of stockinette to place over patient's wrists and secured at patient's thumb.   Patient in [place] w/c at the nurses station for 1-1 supervision from  nurse secretary at end of session with breaks locked, B mitts secured, seat bet alarm set, and all needs within reach. Informed nurse that patient would like a snack at this time. Provided options patient has been successful with per SLP.   Therapy Documentation Precautions:  Precautions Precautions: Fall, Cervical Precaution Booklet Issued: No Precaution Comments: cortrak, bilateral soft mitts Required Braces or Orthoses: Sling, Cervical Brace Cervical Brace: At all times, Hard collar Restrictions Weight Bearing Restrictions: Yes RUE Weight Bearing: Non weight bearing Other Position/Activity Restrictions: in sling, nonoperative mgmt per ortho 9/18 General: PT Amount of Missed Time (min): 15 Minutes PT Missed Treatment Reason: Patient fatigue;Other (Comment)    Therapy/Group: Individual Therapy  Gerrie Castiglia L Sheldon Sem PT, DPT, NCS, CBIS  02/07/2022, 4:33 PM

## 2022-02-07 NOTE — Progress Notes (Signed)
Pt agreeable to return to bed @ 0230 pt slept from 0230-0545. Pt is calm and cooperative while receiving morning medications and patient care. Pt remains in bed waist restraint in place call bell within reach.   Dominic Smith

## 2022-02-07 NOTE — Progress Notes (Signed)
PROGRESS NOTE   Subjective/Complaints: Pt with "rough" evening by most accounts. Increased confusion and agitation. Reading therapy notes, he didn't seem to do too badly during the day. Was up a lot last night. Had a difficult morning. Had settled down by the time I saw him around 0800. Was near were he was cognitively and behaviorally on Friday when I saw him. Low cbg recorded late afternoon. Didn't eat much. Pt's not diabetic.  ROS: Limited due to cognitive/behavioral    Objective:   No results found.  Recent Labs    02/07/22 0558  WBC 9.3  HGB 8.1*  HCT 26.2*  PLT 331     Recent Labs    02/07/22 0558  NA 141  K 4.3  CL 109  CO2 24  GLUCOSE 96  BUN 26*  CREATININE 1.22  CALCIUM 8.6*      Intake/Output Summary (Last 24 hours) at 02/07/2022 0853 Last data filed at 02/07/2022 [id number] Gross per 24 hour  Intake 100 ml  Output 1200 ml  Net -1100 ml      Pressure Injury 01/13/22 Coccyx Mid Unstageable - Full thickness tissue loss in which the base of the injury is covered by slough (yellow, tan, gray, green or brown) and/or eschar (tan, brown or black) in the wound bed. skin is broken with redness at the (Active)  01/13/22 1930  Location: Coccyx  Location Orientation: Mid  Staging: Unstageable - Full thickness tissue loss in which the base of the injury is covered by slough (yellow, tan, gray, green or brown) and/or eschar (tan, brown or black) in the wound bed.  Wound Description (Comments): skin is broken with redness at the base  Present on Admission: Yes (present on admission to rehab 9/27)    Physical Exam: Vital Signs Blood pressure (!) 140/51, pulse (!) 104, temperature 98.6 F (37 C), temperature source Oral, resp. rate 17, height 5\' 11"  (1.803 m), weight 59 kg, SpO2 100 %.  Constitutional: No distress . Vital signs reviewed. HEENT: NCAT, EOMI, oral membranes moist, NGT Neck: [place]  J Cardiovascular: RRR without murmur. No JVD    Respiratory/Chest: CTA Bilaterally without wheezes or rales. Normal effort    GI/Abdomen: BS +, non-tender, non-distended Ext: no clubbing, cyanosis, or edema Psych: pleasant and cooperative  Skin: wounds at knees, both hands, left elbow, right ankle.  Sacral wound present. Foam dressings at based of collar Neuro: alert. Followed commands. Confabulated. Participated in conversation with me. Limited insight. Speech clear.  Moves all 4 limbs spontaneously but difficut to grade.   Musculoskeletal: no focal jt pain, nl prom. Right arm in sling    Assessment/Plan: 1. Functional deficits which require 3+ hours per day of interdisciplinary therapy in a comprehensive inpatient rehab setting. Physiatrist is providing close team supervision and 24 hour management of active medical problems listed below. Physiatrist and rehab team continue to assess barriers to discharge/monitor patient progress toward functional and medical goals  Care Tool:  Bathing        Body parts bathed by helper: Right arm, Left arm, Chest, Abdomen, Front perineal area, Buttocks, Right upper leg, Left upper leg, Right lower leg, Left lower leg, Face  Bathing assist Assist Level: 2 Helpers     Upper Body Dressing/Undressing Upper body dressing   What is the patient wearing?: Pull over shirt    Upper body assist Assist Level: Dependent - Patient 0%    Lower Body Dressing/Undressing Lower body dressing            Lower body assist Assist for lower body dressing: 2 Helpers     Toileting Toileting    Toileting assist Assist for toileting: Moderate Assistance - Patient 50 - 74%     Transfers Chair/bed transfer  Transfers assist     Chair/bed transfer assist level: Moderate Assistance - Patient 50 - 74%     Locomotion Ambulation   Ambulation assist   Ambulation activity did not occur: Safety/medical concerns  Assist level: Moderate Assistance -  Patient 50 - 74% Assistive device: Hand held assist Max distance: 180   Walk 10 feet activity   Assist  Walk 10 feet activity did not occur: Safety/medical concerns  Assist level: Moderate Assistance - Patient - 50 - 74% Assistive device: Hand held assist   Walk 50 feet activity   Assist Walk 50 feet with 2 turns activity did not occur: Safety/medical concerns  Assist level: Moderate Assistance - Patient - 50 - 74% Assistive device: Hand held assist    Walk 150 feet activity   Assist Walk 150 feet activity did not occur: Safety/medical concerns  Assist level: Moderate Assistance - Patient - 50 - 74% Assistive device: Hand held assist    Walk 10 feet on uneven surface  activity   Assist Walk 10 feet on uneven surfaces activity did not occur: Safety/medical concerns         Wheelchair     Assist Is the patient using a wheelchair?: Yes Type of Wheelchair: Manual    Wheelchair assist level: Dependent - Patient 0% (TIS)      Wheelchair 50 feet with 2 turns activity    Assist    Wheelchair 50 feet with 2 turns activity did not occur: Safety/medical concerns       Wheelchair 150 feet activity     Assist  Wheelchair 150 feet activity did not occur: Safety/medical concerns       Blood pressure (!) 140/51, pulse (!) 104, temperature 98.6 F (37 C), temperature source Oral, resp. rate 17, height 5\' 11"  (1.803 m), weight 59 kg, SpO2 100 %.  Medical Problem List and Plan: 1. 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 Supervision goals  -Continue CIR therapies including PT, OT, and SLP  2.  Antithrombotics: -DVT/anticoagulation:  Pharmaceutical: Lovenox 30 mg BID             -antiplatelet therapy: aspirin 81 mg daily 3. Pain Management: continue Tylenol per tube scheduled,\; oxycodone, Robaxin prn 4. 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 Neuropsych/cognition: This patient is not capable of making decisions on his  own behalf.  -telesitter, soft waist belt, mittens for safety            - continue Seroquel 25 mg scheduled qhs and qhs PRN  -continue sleep chart   6. Skin/Wound Care: routine skin care checks             -monitor scalp lac/skin abrasions             -appreciate WOC recs for unstageable sacral/coccyx pressure injury   -continue medihoney/foam dressing 7. Fluids/Electrolytes/Nutrition/dysphagia: Strict Is and Os and follow-up chemistries             -MBS --diet initiated D2/thins  10/12-decided to keep cortrak in for now, decreased HS TF  -pt appears to have eaten a little more this morning  -discussed PO intake with pt. Demonstrated SOME insight  -BMET WNL  -added megace to help stimulate appetite on 10/12  -10/16 eating inconsistently. At times 50%   -TF resumed at higher rate again yesterday   -can keep timing of TF the same but I want to reduce rate to stimulate appetite. He's not going to eat with a full stomach of formula 8: Right distal clavicle and humerus fracture:  -continue sling; follow-up with Dr. Marcelino Scot on Monday -NWB RUE, pendulum activiites  9: Right 1 st rib fracture: pneumothorax resolved; IS/FV 10: C5-C6, C4 and C7 transverse process fractures:  -continue cervical collar; follow-up with Dr Annette Stable 11: Possible right VA and ICA injuries: continue daily aspirin 13: Urinary retention: has indwelling Foley catheter             -continue cardura    10/8 -Replaced foley cath d/t urinary retention and bleeding/irritation with IC  10/10 will need catheter at discharge 15: SAH:   completed 7 days of Keppra; follow-up with neurosurgery 16: Volume overload/peripheral edema: received lasix 40mg  x 1 last week             -strict Is and Os and daily weights              Filed Weights   02/06/22 0512 02/06/22 0544 02/07/22 0507  Weight: 66.7 kg 59 kg 59 kg    -10/14-10/15 weight down 17: AKI: see above 18: Elevated transaminases: trending downward; follow-up CMP tomorrow 19:  ?aspiration pneumonia w/x hx of staph and pseudomonas - vancomycin and cefepime total of 7 more days ended 10/5             -10/16 wbcs 9.3 20: SVT/sinus tach intermittently: monitor 21: Mediastinal hematoma: Echo WNL; Hgb stable 22: Ankle edema and ecchymosis: x-rays negative for osseous abnormality           Continue TEDS, elevation in bed 23. Loose stools. No s/s infectious diarrhea. Likely d/t abx and Tfs. Getting lomotil PRN.    -expect further improvement off TF (in addition to abx)  -10/8 Last BM  --sorbitol today  10/15: messaged nursing to determine last BM, was 12th, will add miralax daily  10/16 still no bm---sorbitol today thru tube 24. Normocytic anemia/ABLA: likely related to hospitalization and chronic disease  -no gross blood loss  continue b complex and iron  -continue to follow  -10/9 hgb 7.8 25. Hypoglycemia: changed order for TF to start at 5pm  10/16--he's not diabetic. Dc cbg checks.     LOS: 19 days A FACE TO [place] 02/07/2022, 8:53 AM

## 2022-02-07 NOTE — Progress Notes (Signed)
Pt has not slept and continues to be awake through out the night refusing to go to sleep, or get out of the chair to get in bed.  Pt restless but calm until attempting to direct pt to room to get in bed pt gets agitated holding onto things to prevent from getting out of the chair into bed. Pt remains in wheelchair at nurses station. Prn dose of Seroquel given but ineffective at this time.   Dayna Ramus

## 2022-02-07 NOTE — Progress Notes (Signed)
Patient ID: Dominic Smith, male   DOB: 03-07-1941, 81 y.o.   MRN: [id number]  NCPASRR# [id number] .  SW spoke with pt ex-wife [name] to discuss SNF progress. Reports that she intends to view facilities today. SW informed will confirm with medical team when he is medically ready to transition to SNF.  *attending indicates possibly end of week pt will be medically ready.   Loralee Pacas, MSW, [place] Office: [phone] Cell: [phone] Fax: [phone]
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
## 2022-02-07 NOTE — Progress Notes (Signed)
Speech Language Pathology TBI Note  Patient Details  Name: Dominic Smith MRN: [id number] Date of Birth: 1941/03/03  Today's Date: 02/07/2022 SLP Individual Time: 0700-0756 SLP Individual Time Calculation (min): 56 min  Short Term Goals: Week 3: SLP Short Term Goal 1 (Week 3): Patient will consume current diet with minimal overt s/s of aspiration and Mod verbal cues for use of swallowing compensatory strategies. SLP Short Term Goal 2 (Week 3): Patient will orient to place and month with Max A multimodal cues. SLP Short Term Goal 3 (Week 3): Patient will follow commands throughout functional tasks in 50% of opportunities with Max multimodal cues. SLP Short Term Goal 4 (Week 3): Patient will demonstate sustained attenton to functional tasks for 2 minutes with Max A multimodal cues for redirection.  Skilled Therapeutic Interventions: Skilled treatment session focused on cognitive and swallowing goals. Upon arrival, patient was sideways in bed, confused and verbally agitated. Patient with language of confusion and confabulation with perseveration on cars. Patient was provided passive orientation, reassurance and emotional support which eventually calmed the patient down enough to consume breakfast. Patient consumed ~25% of Dys. 2 textures with thin liquids with intermittent subtle throat clearing that did not appear related to eating. Recommend patient continue current diet. Mod-Max A verbal cues were needed for initiation with self-feeding due to decreased attention to task/verbosity.  Patient constantly asked for SLP to call the "[employer]." SLP called the patient's son and was able to talk to him and provide reassurance of safety. Patient eventually agreeable to attempting to take a nap due to minimal sleep last night. Patient left reclined in bed with alarm on, mitts in place and all needs within reach. Continue with current plan of care.      Pain Pain Assessment Pain Scale: 0-10 Pain  Score: 0-No pain  Agitated Behavior Scale: TBI Observation Details Observation Environment: CIR Start of observation period - Date: 02/07/22 Start of observation period - Time: 0700 End of observation period - Date: 02/07/22 End of observation period - Time: 0756 Agitated Behavior Scale (DO NOT LEAVE BLANKS) Short attention span, easy distractibility, inability to concentrate: Present to a moderate degree Impulsive, impatient, low tolerance for pain or frustration: Present to a slight degree Uncooperative, resistant to care, demanding: Present to a slight degree Violent and/or threatening violence toward people or property: Absent Explosive and/or unpredictable anger: Absent Rocking, rubbing, moaning, or other self-stimulating behavior: Absent Pulling at tubes, restraints, etc.: Present to a slight degree Wandering from treatment areas: Absent Restlessness, pacing, excessive movement: Present to a slight degree Repetitive behaviors, motor, and/or verbal: Present to a moderate degree Rapid, loud, or excessive talking: Present to a moderate degree Sudden changes of mood: Absent Easily initiated or excessive crying and/or laughter: Absent Self-abusiveness, physical and/or verbal: Absent Agitated behavior scale total score: 24  Therapy/Group: Individual Therapy  Adryanna Friedt, [place] 02/07/2022, 1:53 PM

## 2022-02-07 NOTE — Progress Notes (Signed)
Since 1600 patient has demonstrated restlessness. Nursing team intervening with medication per [facility]. As evening progressed restlessness transitioned to confusion and disorientation. Patient under the assumption of being in a parked car. Attempting to exit the bed multiple times. Nursing staff having to stay with patient and provide reassurance since 1930. Demonstrating aggressive behavior as well. Attempting to Chief Strategy Officer and another Marine scientist. Attempting to kick at staff as well. Outside of medication repositioning patient multiple times to assist in deescalating actions. Also, utilizing the TV and therapeutic distractions. Continue to monitor situation.

## 2022-02-08 ENCOUNTER — Inpatient Hospital Stay (HOSPITAL_COMMUNITY): Payer: Medicare Other

## 2022-02-08 LAB — URINALYSIS, ROUTINE W REFLEX MICROSCOPIC
Bilirubin Urine: NEGATIVE
Glucose, UA: NEGATIVE mg/dL
Hgb urine dipstick: NEGATIVE
Ketones, ur: NEGATIVE mg/dL
Leukocytes,Ua: NEGATIVE
Nitrite: NEGATIVE
Protein, ur: NEGATIVE mg/dL
Specific Gravity, Urine: 1.017 (ref 1.005–1.030)
pH: 7 (ref 5.0–8.0)

## 2022-02-08 LAB — GLUCOSE, CAPILLARY
Glucose-Capillary: 103 mg/dL — ABNORMAL HIGH (ref 70–99)
Glucose-Capillary: 103 mg/dL — ABNORMAL HIGH (ref 70–99)
Glucose-Capillary: 107 mg/dL — ABNORMAL HIGH (ref 70–99)
Glucose-Capillary: 75 mg/dL (ref 70–99)
Glucose-Capillary: 95 mg/dL (ref 70–99)
Glucose-Capillary: 97 mg/dL (ref 70–99)

## 2022-02-08 MED ORDER — QUETIAPINE FUMARATE 50 MG PO TABS
50.0000 mg | ORAL_TABLET | Freq: Every day | ORAL | Status: DC
  Administered 2022-02-08: 50 mg

## 2022-02-08 MED ORDER — FLEET ENEMA 7-19 GM/118ML RE ENEM: 1.0000 | ENEMA | Freq: Once | RECTAL | Status: DC | PRN

## 2022-02-08 MED ORDER — SORBITOL 70 % SOLN
60.0000 mL | Freq: Once | Status: AC
  Administered 2022-02-08: 60 mL
  Filled 2022-02-08: qty 60

## 2022-02-08 MED ORDER — PROSOURCE TF20 ENFIT COMPATIBL EN LIQD
60.0000 mL | Freq: Three times a day (TID) | ENTERAL | Status: DC
  Administered 2022-02-08 – 2022-02-10 (×7): 60 mL
  Filled 2022-02-08 (×7): qty 60

## 2022-02-08 MED ORDER — LORAZEPAM 2 MG/ML IJ SOLN: 1.0000 mg | Freq: Once | INTRAMUSCULAR | Status: DC

## 2022-02-08 MED ORDER — OSMOLITE 1.5 CAL PO LIQD
240.0000 mL | ORAL | Status: DC
  Administered 2022-02-08: 240 mL

## 2022-02-08 NOTE — Progress Notes (Signed)
Patient ID: Fahd Galea, male   DOB: December 24, 1940, 81 y.o.   MRN: [id number]  SW spoke with pt ex-wife  [name] to provide updates from team conference, and discussed SNF search. Reports she is still exploring. SW shared pt will not transition until he is medically ready, however, SW will need list of preferred SNF locations. She will follow-up with SW to provide a list of names.   SW spoke with pt son [name] to discuss above as well. He states he is working on alternative housing options if needed.   Loralee Pacas, MSW, [place] Office: [phone] Cell: [phone] Fax: [phone]

## 2022-02-08 NOTE — Plan of Care (Signed)
Goals downgraded in careplan to reflect slow progress towards an overall MOD A level for BADLs and MAX A for functional cogntion. Shower transfer goal discontinued as pt likely to DC to SNF and is no longer appropriate at this time. Please open careplan to see changes to long term goal.   Mariane Masters MOTR/L

## 2022-02-08 NOTE — Progress Notes (Signed)
Patient had good night, slept about 6 hours.

## 2022-02-08 NOTE — Progress Notes (Signed)
Soft waist restraint applied. Patient tolerating restraint, skin checked before and after applying, intact. Patient AX1.

## 2022-02-08 NOTE — Progress Notes (Signed)
Patient becoming restless and beginning to throw legs over side rails of bed. Telesitter has called several times about patient moving around in bed and attempting to exit bed. Patient impulsive and stating he is leaving to go work on cars. Unable to re direct patient and patient requiring 1 to 1 care.

## 2022-02-08 NOTE — Progress Notes (Signed)
Physical Therapy Session Note  Patient Details  Name: Dominic Smith MRN: [id number] Date of Birth: 06-09-1940  Today's Date: 02/08/2022 PT Individual Time: 1030-1115 PT Individual Time Calculation (min): 45 min   Short Term Goals: Week 3:  PT Short Term Goal 1 (Week 3): Patient will perform bed mobility with min A >50% of the time. PT Short Term Goal 2 (Week 3): Patient will perform basic transfers with min A consistently. PT Short Term Goal 3 (Week 3): Patient will ambulate >300 ft with min A without need for additional assist for LOB. PT Short Term Goal 4 (Week 3): Patient will participate in a formal balance assessment.  Skilled Therapeutic Interventions/Progress Updates:     Patient in TIS w/c at the nurses station upon PT arrival. Patient alert and agreeable to PT session. Patient denied pain during session.  Patient oriented to self, place, and situation with min cuing, disoriented to month and year.  B thigh high TEDs, B ACE wraps, and abdominal binder donned prior to and throughout session. Adjusted R arm sling during session.   Noted patient with increased posterior lean and required addition assistance, mod A, for mobility today. Patient with poor sleep and agitation last night requiring PRN medication for behavior management. Will continue to monitor patient's performance for changes.   Orthostatic Vitals: Sitting: BP 134/81 Standing: BP 118/67 Standing x3 min: BP 110/71 (patient with mild symptoms)  Therapeutic Activity: Transfers: Patient performed sit to/from stand x3 with mod A with posterior bias. Provided multi-modal cues for initiation and forward weight shift. Patient stood >3 min with min-mod A, continued with posterior bias throughout with only intermittent correction with max cuing.   Gait Training:  Patient ambulated ~100 feet with L HHA with min-mod A. Ambulated with increased shuffling gait and reduced gait speed today. Provided facilitation for forward  propulsion and increased weight shift for increased step length.  Patient required increased time for initiation, cuing, rest breaks, and for completion of tasks throughout session. Utilized therapeutic use of self throughout to promote efficiency.   Patient in [place] w/c handed off to nurse secretary at the nurses station at end of session with breaks locked, seat belt alarm set, B stockinettes and mitts secure, and all needs within reach.   Therapy Documentation Precautions:  Precautions Precautions: Fall, Cervical Precaution Booklet Issued: No Precaution Comments: cortrak, bilateral soft mitts Required Braces or Orthoses: Sling, Cervical Brace Cervical Brace: At all times, Hard collar Restrictions Weight Bearing Restrictions: Yes RUE Weight Bearing: Non weight bearing Other Position/Activity Restrictions: in sling, nonoperative mgmt per ortho 9/18    Therapy/Group: Individual Therapy  Breeanna Galgano L Cyncere Sontag PT, DPT, NCS, CBIS  02/08/2022, 8:50 PM

## 2022-02-08 NOTE — Progress Notes (Signed)
Nutrition Follow-up  DOCUMENTATION CODES:   Not applicable  INTERVENTION:   Tube Feeds via Cortrak: Recommend increasing Osmolite 1.5 to 70 mL/hr x 10 hours  ProSource TF20 - TID 200 mL free water flush q8h  Provides 1290 kcal, 104 gm protein, and 1133 mL free water daily.  Continue Nutrisource Fiber per tube Discontinue Ensure Enlive po BID, each supplement provides 350 kcal and 20 grams of protein. Recommend increasing bowel regimen due to no BM >5 days  NUTRITION DIAGNOSIS:   Altered GI function related to diarrhea as evidenced by other (comment) (multiple loose BMs per RN). - Progressing   GOAL:   Patient will meet greater than or equal to 90% of their needs - Being addressed via TF  MONITOR:   PO intake, TF tolerance, Skin, I & O's, Labs  REASON FOR ASSESSMENT:   Consult Enteral/tube feeding initiation and management, Other (Comment) (loose stools)  ASSESSMENT:   81 y.o. 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 PMH reviewed.  10/09 - diet advanced to Dysphagia 2, thin liquids  Spoke with RN outside of room.Pt with ongoing agitation, unable to redirect. Reports that pt is eating but remains inconsistent. Pt is not drinking Ensure, RN reports that they bolused one through the [place]. RD recommends increasing nocturnal feeds due to pt not meeting needs via PO. Current tube feeds regimen of Osmolite 1.5 at 40 mL x 6 hours only provides 360 kcal.    Meal Documentation: 10/13-10/17: 25-50% x 6 meals (average 43%)  Medications reviewed and include: B complex w/ Vitamin C, Ferrous Sulfate, Nutrisource Fiber, Megace, Miralax  Labs reviewed: 24 hr CBG 87-118  UOP: 2500 mL x 24 hours   Diet Order:   Diet Order             DIET DYS 2 Room service appropriate? Yes; Fluid consistency: Thin  Diet effective now                   EDUCATION NEEDS:   Not appropriate for education at this time  Skin:  Skin  Assessment: Skin Integrity Issues: Skin Integrity Issues:: Unstageable Unstageable: Unstageable wound to coccyx  Last BM:  10/12  Height:   Ht Readings from Last 1 Encounters:  01/19/22 5\' 11"  (1.803 m)    Weight:   Wt Readings from Last 1 Encounters:  02/08/22 64.9 kg    Ideal Body Weight:  78.2 kg  BMI:  Body mass index is 19.94 kg/m.  Estimated Nutritional Needs:   Kcal:  1800-2000  Protein:  90-105 grams  Fluid:  >/= 1.8 L   Hermina Barters RD, LDN Clinical Dietitian See [facility] for contact information.

## 2022-02-08 NOTE — Plan of Care (Addendum)
Behavioral Plan    Rancho Level: Rancho 5   Behavior to decrease/ eliminate: impulsivity Confusion Getting OOB Agitation    Changes to environment:  -Lights on, blinds open during the day; off and closed at night -Telesitter - enclosure bed   Interventions: Mittens with stockinette under for skin integrity Soft waist restraint when up in chair- telesitter May sit up in room in TIS with mittens and soft waist restraint, and telesitting in front of pt.   Recommendations for interactions with patient: Gently reorient patient Offer to call family if pt confused May need increased time to respond or redirection to functional tasks   Attendees:  Uvaldo Rising PT Dietrich Pates SLP Jae Dire OT Phylis Bougie RN         Note Details  Chewelah Desanctis, Dominic Smith, OT File Time 01/25/2022 10:29 AM  Author Type Occupational Therapist Status Signed  Last Editor Dominic Smith, [place] # [phone] Admit Date 01/19/2022

## 2022-02-08 NOTE — Progress Notes (Signed)
Patient is calm and resting.Patient is no longer abusive to the staff and not throwing legs over or kicking. Patient is safe . Call bell with in reach.

## 2022-02-08 NOTE — Patient Care Conference (Signed)
Inpatient RehabilitationTeam Conference and Plan of Care Update Date: 02/08/2022   Time: 10:20 AM    Patient Name: Dominic Smith      Medical Record Number: [id number]  Date of Birth: December 12, 1940 Sex: Male         Room/Bed: 4W10C/4W10C-01 Payor Info: Payor: MEDICARE / Plan: MEDICARE PART A / Product Type: *No Product type* /    Admit Date/Time:  01/19/2022  2:31 PM  Primary Diagnosis:  TBI (traumatic brain injury) [facility])  Hospital Problems: Principal Problem:   TBI (traumatic brain injury) ([place]) Active Problems:   HCAP (healthcare-associated pneumonia)   Azotemia   Urinary retention   Hypervolemia    Expected Discharge Date: Expected Discharge Date:  (SNF)  Team Members Present: Physician leading conference: Dr. Alger Simons Social Worker Present: Loralee Pacas, Friendly Nurse Present: Other (comment) Tacy Learn, RN) PT Present: Apolinar Junes, PT OT Present: Mariane Masters, OT SLP Present: Weston Anna, SLP PPS Coordinator present : Gunnar Fusi, SLP     Current Status/Progress Goal Weekly Team Focus  Bowel/Bladder   Incontinent of Bowel. Foley catheter in place  regain bowel cont.  assess q shift and PRN   Swallow/Nutrition/ Hydration   Dys. 2 textures with thin liquids, Mod A  Min A  tolerance of current diet, use of swallowing compensatory strategies   ADL's   RUE NWB, MOD-MIN A SPT, increased confusion/disorientation, can be as good as MOD A for ADLs however MAX-total A more conisistently  MOD A  ADL retraining, orientation, DC planning, transfer training, basic cognition   Mobility   min A overall with intermittent mod A with fatigue or orthostasis, gait limited this week by orthostatic hypotension with binder, TEDs, and ACE wraps, patient with increased restlessness and bouts of disorientation and reduced frustration tolerance with staff  Min A overall  Activity tolerance, functional mobility, motor planning, initiation, orietation, gait and  stair training, balance, stimulation tolerance, behavior management, patient/caregiver education   Communication   Supervision  Supervision  Goal Met   Safety/Cognition/ Behavioral Observations  Rancho Level iV-V, Max-Total A with language of confusion, confabulation, verbal agitiation and intermittent physical agititation  Mod  A  sustained attention, orientation, intellectual awareness   Pain   2 of 10 PAINAD  pain<3  assess pain q shift and pRN   Skin   unstagable to buttoks and MASD  promote healing and prevention of infection  assess skin q shift and PRN     Discharge Planning:  Pt will discharge to SNF. D/c pending medical readiness. Family exploring preferred SNFs at this time.   Team Discussion: TBI. Incontinent bowel, foley remains. Coude placed 01/30/22. PRNs for generalized pain. Unstageable to coccyx. MASD.  New skin tears to bilateral upper extremities from patient pulling on mitts. Sockets added for skin protection. Tele-sitter. Soft waist restraint. C-collar. RUE-NWB with sling. Cortrak with tube feeds. D2/thin diet. Assist with self feeding. Therapies limited by fluctuations and orthostatics 80s/50s with ABD binder, ted hose, and ace wraps. More difficult to redirect.  Patient on target to meet rehab goals: SNF  *See Care Plan and progress notes for long and short-term goals.   Revisions to Treatment Plan:  Tube feed adjustments. Monitor labs, move towards medical readiness Teaching Needs: Medications, safety, skin/wound care, gait/transfer training, etc.   Current Barriers to Discharge: Medical stability, Home enviroment access/layout, Incontinence, Wound care, Lack of/limited family support, Weight bearing restrictions, Behavior, and Nutritional means  Possible Resolutions to Barriers: Remove medical devices, bed offer from SNF  Medical Summary Current Status: still confused with some evening agitation. sleeping better. taking in more by mouth  Barriers to  Discharge: Medical stability   Possible Resolutions to [employer] Focus: daily labs, liberate from NGT-->adv to oral diet alone   Continued Need for Acute Rehabilitation Level of Care: The patient requires daily medical management by a physician with specialized training in physical medicine and rehabilitation for the following reasons: Direction of a multidisciplinary physical rehabilitation program to maximize functional independence : Yes Medical management of patient stability for increased activity during participation in an intensive rehabilitation regime.: Yes Analysis of laboratory values and/or radiology reports with any subsequent need for medication adjustment and/or medical intervention. : Yes   I attest that I was present, lead the team conference, and concur with the assessment and plan of the team.   Ernest Pine 02/08/2022, 2:14 PM

## 2022-02-08 NOTE — Progress Notes (Signed)
Occupational Therapy TBI Note  Patient Details  Name: Dominic Smith MRN: [id number] Date of Birth: Apr 09, 1941  Today's Date: 02/08/2022 OT Individual Time: 1410-1444 OT Individual Time Calculation (min): 34 min   1410-1444 34 min session 2:   Short Term Goals: Week 1:  OT Short Term Goal 1 (Week 1): Pt will consistently transfer to toilet with 1 caregiver OT Short Term Goal 1 - Progress (Week 1): Met OT Short Term Goal 2 (Week 1): Pt will complete 1/4 steps of donning shirt OT Short Term Goal 2 - Progress (Week 1): Progressing toward goal OT Short Term Goal 3 (Week 1): Pt will complete oral care with MOD A OT Short Term Goal 3 - Progress (Week 1): Progressing toward goal OT Short Term Goal 4 (Week 1): Pt will sit to stand with MOD A or less OT Short Term Goal 4 - Progress (Week 1): Met  Skilled Therapeutic Interventions/Progress Updates:     Pt received in bed with no pain reported. Confabulating about mowing the yard, but takes gentle redirection well.  ADL: Pt completes ADL at overall MAX A Level. Skilled interventions include: multimodal cuing for DAL at sink, up to MOD A for power up to standing but with difficulty demo decreased stepping especially with RUE. VSS throughout, but pt with closing eyes in standing. Pt able to verbalize throughout eyes closed. Overall pt unable to reorient and confusion impacting participation in [place]. Pt able to initiate steps of donning shirt, but unable to attend long enough to execute steps    Pt left at end of session in bed with exit alarm on, call light in reach and all needs met  Session 2:  Pt received in bed after pushing session back an hour d/t fatigue. Pt states, "I need to go get the wire for the water to the TV." Pt also oriented to [place] "but that's not a hospital" unable to be redirected but states he needs to go to the bathroom. ADL: Pt completes sup>sit with MAX A but better follow through to roll to side to then push up.  Poor adherence throughotu with WB precautions despite cuing and education on broken arm/sling use. Minimal agitation with OT trying to redirect to not ues arm. MOD A mobility into and out of bathroom with pt requesting OT to leave. Pt very perseverative on pulling toilet paper and when OT redirects to not use RUE to wipe (no BM/bladder even happened), pt hits toilet paper holder in anger. This moment was fleeting and pt stands up frustrated but redirectable back to bed. Pt requires increased time and quiet cuing to scoot to [facility] and then MAX for EOB>supine.   Pt left at end of session in bed with exit alarm on mitts, soft waist restraint, call light in reach and all needs met   Therapy Documentation Precautions:  Precautions Precautions: Fall, Cervical Precaution Booklet Issued: No Precaution Comments: cortrak, bilateral soft mitts Required Braces or Orthoses: Sling, Cervical Brace Cervical Brace: At all times, Hard collar Restrictions Weight Bearing Restrictions: Yes RUE Weight Bearing: Non weight bearing Other Position/Activity Restrictions: in sling, nonoperative mgmt per ortho 9/18 General:     Agitated Behavior Scale: TBI  Observation Details Observation Environment: CIR Start of observation period - Date: 02/08/22 Start of observation period - Time: 1410 End of observation period - Date: 02/08/22 End of observation period - Time: 1440 Agitated Behavior Scale (DO NOT LEAVE BLANKS) Short attention span, easy distractibility, inability to concentrate: Present to a moderate  degree Impulsive, impatient, low tolerance for pain or frustration: Present to a moderate degree Uncooperative, resistant to care, demanding: Present to a slight degree Violent and/or threatening violence toward people or property: Absent Explosive and/or unpredictable anger: Present to a slight degree Rocking, rubbing, moaning, or other self-stimulating behavior: Absent Pulling at tubes, restraints, etc.:  Present to a slight degree Wandering from treatment areas: Absent Restlessness, pacing, excessive movement: Present to a moderate degree Repetitive behaviors, motor, and/or verbal: Present to a moderate degree Rapid, loud, or excessive talking: Present to a moderate degree Sudden changes of mood: Present to a moderate degree Easily initiated or excessive crying and/or laughter: Absent Self-abusiveness, physical and/or verbal: Absent Agitated behavior scale total score: 29  Therapy/Group: Individual Therapy  [name] 02/08/2022, 2:52 PM

## 2022-02-08 NOTE — Progress Notes (Signed)
Speech Language Pathology TBI Note  Patient Details  Name: Dominic Smith MRN: [id number] Date of Birth: 1940/05/07  Today's Date: 02/08/2022 SLP Individual Time: 1205-1303 SLP Individual Time Calculation (min): 58 min  Short Term Goals: Week 3: SLP Short Term Goal 1 (Week 3): Patient will consume current diet with minimal overt s/s of aspiration and Mod verbal cues for use of swallowing compensatory strategies. SLP Short Term Goal 2 (Week 3): Patient will orient to place and month with Max A multimodal cues. SLP Short Term Goal 3 (Week 3): Patient will follow commands throughout functional tasks in 50% of opportunities with Max multimodal cues. SLP Short Term Goal 4 (Week 3): Patient will demonstate sustained attenton to functional tasks for 2 minutes with Max A multimodal cues for redirection.  Skilled Therapeutic Interventions: Skilled treatment session focused on cognitive and dysphagia goals. Upon arrival, patient was awake while upright in the wheelchair and appeared lethargic. Patient demonstrated decreased speech intelligibility with word-finding deficits and intermittent phonemic paraphasias, suspect due to fatigue. Patient required total A for orientation to place, time and situation and perseverative on finding his Lucianne Lei. Patient agreeable to consuming his lunch meal of Dys. 2 textures with thin liquids via cup. Patient attempted to feed himself with his RUE resulting in requiring assistance to get his fork to his mouth. Max verbal, tactile and visual cues were also needed for a slow rate and small bites/sips. Intermittent throat clearing noted throughout meal. Recommend patient continue current diet. Patient perseverative on blowing his nose with Max verbal cues needed for problem solving as patient was attempting to utilize any cloth item within reach (pants, mitts, etc). Patient requested to get back to bed at end of session. Patient left supine in bed with alarm on, mitts and soft  waistbelt in place and all needs within reach. Continue with current plan of care.      Pain No/Denies Pain   Agitated Behavior Scale: TBI Observation Details Observation Environment: CIR Start of observation period - Date: 02/08/22 Start of observation period - Time: 1205 End of observation period - Date: 02/08/22 End of observation period - Time: 1303 Agitated Behavior Scale (DO NOT LEAVE BLANKS) Short attention span, easy distractibility, inability to concentrate: Present to a moderate degree Impulsive, impatient, low tolerance for pain or frustration: Present to a slight degree Uncooperative, resistant to care, demanding: Absent Violent and/or threatening violence toward people or property: Absent Explosive and/or unpredictable anger: Absent Rocking, rubbing, moaning, or other self-stimulating behavior: Absent Pulling at tubes, restraints, etc.: Present to a slight degree Wandering from treatment areas: Absent Restlessness, pacing, excessive movement: Present to a slight degree Repetitive behaviors, motor, and/or verbal: Present to a moderate degree Rapid, loud, or excessive talking: Absent Sudden changes of mood: Absent Easily initiated or excessive crying and/or laughter: Absent Self-abusiveness, physical and/or verbal: Absent Agitated behavior scale total score: 21  Therapy/Group: Individual Therapy  Vietta Bonifield 02/08/2022, 2:41 PM

## 2022-02-08 NOTE — Progress Notes (Signed)
PROGRESS NOTE   Subjective/Complaints: Nurse reported good night. Slept for about 6 hours or so. Ate 50% breakfast today. Says right shoulder still sore.   ROS: Limited due to cognitive/behavioral    Objective:   No results found.  Recent Labs    02/07/22 0558  WBC 9.3  HGB 8.1*  HCT 26.2*  PLT 331     Recent Labs    02/07/22 0558  NA 141  K 4.3  CL 109  CO2 24  GLUCOSE 96  BUN 26*  CREATININE 1.22  CALCIUM 8.6*      Intake/Output Summary (Last 24 hours) at 02/08/2022 [id number] Last data filed at 02/08/2022 0801 Gross per 24 hour  Intake 507 ml  Output 2250 ml  Net -1743 ml      Pressure Injury 01/13/22 Coccyx Mid Unstageable - Full thickness tissue loss in which the base of the injury is covered by slough (yellow, tan, gray, green or brown) and/or eschar (tan, brown or black) in the wound bed. skin is broken with redness at the (Active)  01/13/22 1930  Location: Coccyx  Location Orientation: Mid  Staging: Unstageable - Full thickness tissue loss in which the base of the injury is covered by slough (yellow, tan, gray, green or brown) and/or eschar (tan, brown or black) in the wound bed.  Wound Description (Comments): skin is broken with redness at the base  Present on Admission: Yes (present on admission to rehab 9/27)    Physical Exam: Vital Signs Blood pressure 126/60, pulse 76, temperature 98.5 F (36.9 C), temperature source Oral, resp. rate 18, height 5\' 11"  (1.803 m), weight 64.9 kg, SpO2 98 %.  Constitutional: No distress . Vital signs reviewed. HEENT: NCAT, EOMI, oral membranes moist Neck: supple Cardiovascular: RRR without murmur. No JVD    Respiratory/Chest: CTA Bilaterally without wheezes or rales. Normal effort    GI/Abdomen: BS +, non-tender, non-distended Ext: no clubbing, cyanosis, or edema Psych: pleasantly confused.  Skin: wounds at knees, both hands, left elbow, right ankle.   Sacral wound present, unstageable and dressed. Foam dressings at based of collar  Neuro: alert. Speech a little more slurred. Michela Pitcher he was at hospital and in [employer]   Moves all 4 limbs spontaneously but difficut to grade.   Musculoskeletal: no focal jt pain, nl prom. Right arm in sling    Assessment/Plan: 1. Functional deficits which require 3+ hours per day of interdisciplinary therapy in a comprehensive inpatient rehab setting. Physiatrist is providing close team supervision and 24 hour management of active medical problems listed below. Physiatrist and rehab team continue to assess barriers to discharge/monitor patient progress toward functional and medical goals  Care Tool:  Bathing        Body parts bathed by helper: Right arm, Left arm, Chest, Abdomen, Front perineal area, Buttocks, Right upper leg, Left upper leg, Right lower leg, Left lower leg, Face     Bathing assist Assist Level: 2 Helpers     Upper Body Dressing/Undressing Upper body dressing   What is the patient wearing?: Pull over shirt    Upper body assist Assist Level: Dependent - Patient 0%    Lower Body Dressing/Undressing Lower  body dressing            Lower body assist Assist for lower body dressing: 2 Helpers     Toileting Toileting    Toileting assist Assist for toileting: Moderate Assistance - Patient 50 - 74%     Transfers Chair/bed transfer  Transfers assist     Chair/bed transfer assist level: Moderate Assistance - Patient 50 - 74%     Locomotion Ambulation   Ambulation assist   Ambulation activity did not occur: Safety/medical concerns  Assist level: Moderate Assistance - Patient 50 - 74% Assistive device: Hand held assist Max distance: 180   Walk 10 feet activity   Assist  Walk 10 feet activity did not occur: Safety/medical concerns  Assist level: Moderate Assistance - Patient - 50 - 74% Assistive device: Hand held assist   Walk 50 feet  activity   Assist Walk 50 feet with 2 turns activity did not occur: Safety/medical concerns  Assist level: Moderate Assistance - Patient - 50 - 74% Assistive device: Hand held assist    Walk 150 feet activity   Assist Walk 150 feet activity did not occur: Safety/medical concerns  Assist level: Moderate Assistance - Patient - 50 - 74% Assistive device: Hand held assist    Walk 10 feet on uneven surface  activity   Assist Walk 10 feet on uneven surfaces activity did not occur: Safety/medical concerns         Wheelchair     Assist Is the patient using a wheelchair?: Yes Type of Wheelchair: Manual    Wheelchair assist level: Dependent - Patient 0% (TIS)      Wheelchair 50 feet with 2 turns activity    Assist    Wheelchair 50 feet with 2 turns activity did not occur: Safety/medical concerns       Wheelchair 150 feet activity     Assist  Wheelchair 150 feet activity did not occur: Safety/medical concerns       Blood pressure 126/60, pulse 76, temperature 98.5 F (36.9 C), temperature source Oral, resp. rate 18, height 5\' 11"  (1.803 m), weight 64.9 kg, SpO2 98 %.  Medical Problem List and Plan: 1. 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 Supervision goals  -Continue CIR therapies including PT, OT, and SLP. Interdisciplinary team conference today to discuss goals, barriers to discharge, and dc planning.    2.  Antithrombotics: -DVT/anticoagulation:  Pharmaceutical: Lovenox 30 mg BID             -antiplatelet therapy: aspirin 81 mg daily 3. Pain Management: continue Tylenol per tube scheduled,\; oxycodone, Robaxin prn 4. 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 Neuropsych/cognition: This patient is not capable of making decisions on his own behalf.               6. Skin/Wound Care: routine skin care checks             -  monitor scalp lac/skin abrasions             -appreciate WOC recs for unstageable sacral/coccyx pressure wound   -continue medihoney/foam dressing 7. Fluids/Electrolytes/Nutrition/dysphagia: Strict Is and Os and follow-up chemistries             -MBS --diet initiated D2/thins  10/12-decided to keep cortrak in for now, decreased HS TF  -pt appears to have eaten a little more this morning  -discussed PO intake with pt. Demonstrated SOME insight  -BMET WNL  -added megace to help stimulate appetite on 10/12  -10/17 ate a little more yesterday   -continue reduced rate TF, will shorten time it's running   -push po/supervision w/ meals   -BMET and prealbumin tomorrow 8: Right distal clavicle and humerus fracture:  -continue sling; follow-up with Dr. Marcelino Scot on Monday -NWB RUE, pendulum activiites  9: Right 1 st rib fracture: pneumothorax resolved; IS/FV 10: C5-C6, C4 and C7 transverse process fractures:  -continue cervical collar; follow-up with Dr Annette Stable 11: Possible right VA and ICA injuries: continue daily aspirin 13: Urinary retention: has indwelling Foley catheter             -continue cardura    10/8 -Replaced foley cath d/t urinary retention and bleeding/irritation with IC  10/10 will need catheter at discharge 15: SAH:  completed 7 days of Keppra; follow-up with neurosurgery 16: Volume overload/peripheral edema: received lasix 40mg  x 1 last week             -strict Is and Os and daily weights              Filed Weights   02/06/22 0544 02/07/22 0507 02/08/22 0500  Weight: 59 kg 59 kg 64.9 kg    -weights are inconsistent 17: AKI: see above 18: Elevated  transaminases: trending downward; follow-up CMP tomorrow 19:  ?aspiration pneumonia w/x hx of staph and pseudomonas - vancomycin and cefepime total of 7 more days ended 10/5             -10/16 wbcs 9.3 20: SVT/sinus tach intermittently: monitor 21: Mediastinal hematoma: Echo WNL; Hgb stable 22: Ankle edema and ecchymosis: x-rays negative for osseous abnormality           Continue TEDS, elevation in bed 23. Loose stools. No s/s infectious diarrhea. Likely d/t abx and Tfs. Getting lomotil PRN.    -expect further improvement off TF (in addition to abx)  -10/8 Last BM  --sorbitol today  10/15: messaged nursing to determine last BM, was 12th, will add miralax daily  10/16 sorbitol without results-- 10.17 repeat and try fleet enema today 24. Normocytic anemia/ABLA: likely related to hospitalization and chronic disease  -no gross blood loss  continue b complex and iron  -continue to follow  -10/9 hgb 7.8 25. Hypoglycemia:    10/16--he's not diabetic. Dc cbg checks.     LOS: 20 days A FACE TO FACE EVALUATION WAS PERFORMED  Meredith Staggers 02/08/2022, 9:05 AM

## 2022-02-08 NOTE — Plan of Care (Signed)
  Problem: RH Cognition - SLP Goal: RH LTG Patient will demonstrate orientation with cues Description:  LTG:  Patient will demonstrate orientation to person/place/time/situation with cues (SLP)   Flowsheets (Taken 02/08/2022 0627) LTG: Patient will demonstrate orientation using cueing (SLP): Moderate Assistance - Patient 50 - 74% Note: Goal downgraded due to slow and inconsistent progress    Problem: RH Problem Solving Goal: LTG Patient will demonstrate problem solving for (SLP) Description: LTG:  Patient will demonstrate problem solving for basic/complex daily situations with cues  (SLP) Flowsheets (Taken 02/08/2022 0627) LTG Patient will demonstrate problem solving for: Moderate Assistance - Patient 50 - 74% Note: Goal downgraded due to slow and inconsistent progress    Problem: RH Attention Goal: LTG Patient will demonstrate this level of attention during functional activites (SLP) Description: LTG:  Patient will will demonstrate this level of attention during functional activites (SLP) Flowsheets (Taken 02/08/2022 0627) LTG: Patient will demonstrate this level of attention during cognitive/linguistic activities with assistance of (SLP): Moderate Assistance - Patient 50 - 74% Note: Goal downgraded due to slow and inconsistent progress    Problem: RH Awareness Goal: LTG: Patient will demonstrate awareness during functional activites type of (SLP) Description: LTG: Patient will demonstrate awareness during functional activites type of (SLP) Flowsheets (Taken 02/08/2022 0627) Patient will demonstrate during cognitive/linguistic activities awareness type of: Intellectual LTG: Patient will demonstrate awareness during cognitive/linguistic activities with assistance of (SLP): Moderate Assistance - Patient 50 - 74% Note: Goal downgraded due to slow and inconsistent progress

## 2022-02-09 ENCOUNTER — Inpatient Hospital Stay (HOSPITAL_COMMUNITY): Payer: Medicare Other

## 2022-02-09 LAB — BASIC METABOLIC PANEL
Anion gap: 10 (ref 5–15)
BUN: 22 mg/dL (ref 8–23)
CO2: 23 mmol/L (ref 22–32)
Calcium: 8.9 mg/dL (ref 8.9–10.3)
Chloride: 110 mmol/L (ref 98–111)
Creatinine, Ser: 1.04 mg/dL (ref 0.61–1.24)
GFR, Estimated: >60 mL/min (ref >60)
Glucose, Bld: 105 mg/dL — ABNORMAL HIGH (ref 70–99)
Potassium: 3.9 mmol/L (ref 3.5–5.1)
Sodium: 143 mmol/L (ref 135–145)

## 2022-02-09 LAB — GLUCOSE, CAPILLARY
Glucose-Capillary: 104 mg/dL — ABNORMAL HIGH (ref 70–99)
Glucose-Capillary: 123 mg/dL — ABNORMAL HIGH (ref 70–99)
Glucose-Capillary: 133 mg/dL — ABNORMAL HIGH (ref 70–99)
Glucose-Capillary: 77 mg/dL (ref 70–99)
Glucose-Capillary: 81 mg/dL (ref 70–99)
Glucose-Capillary: 90 mg/dL (ref 70–99)
Glucose-Capillary: 94 mg/dL (ref 70–99)

## 2022-02-09 LAB — CBC
HCT: 28.4 % — ABNORMAL LOW (ref 39.0–52.0)
Hemoglobin: 9.1 g/dL — ABNORMAL LOW (ref 13.0–17.0)
MCH: 29 pg (ref 26.0–34.0)
MCHC: 32 g/dL (ref 30.0–36.0)
MCV: 90.4 fL (ref 80.0–100.0)
Platelets: 343 10*3/uL (ref 150–400)
RBC: 3.14 MIL/uL — ABNORMAL LOW (ref 4.22–5.81)
RDW: 13.6 % (ref 11.5–15.5)
WBC: 8.5 10*3/uL (ref 4.0–10.5)
nRBC: 0 % (ref 0.0–0.2)

## 2022-02-09 LAB — PREALBUMIN: Prealbumin: 13 mg/dL — ABNORMAL LOW (ref 18–38)

## 2022-02-09 MED ORDER — RISPERIDONE 1 MG/ML PO SOLN
0.5000 mg | Freq: Every day | ORAL | Status: DC
  Administered 2022-02-09: 0.5 mg via ORAL
  Filled 2022-02-09: qty 0.5

## 2022-02-09 MED ORDER — RISPERIDONE 1 MG/ML PO SOLN: 0.2500 mg | Freq: Every evening | ORAL | Status: DC | PRN

## 2022-02-09 MED ORDER — TRAMADOL HCL 50 MG PO TABS
25.0000 mg | ORAL_TABLET | Freq: Four times a day (QID) | ORAL | Status: DC | PRN
  Administered 2022-02-14 – 2022-03-05 (×6): 25 mg via ORAL
  Filled 2022-02-09 (×7): qty 1

## 2022-02-09 MED ORDER — RISPERIDONE 1 MG/ML PO SOLN
0.2500 mg | Freq: Every day | ORAL | Status: DC
  Administered 2022-02-09: 0.25 mg via ORAL
  Filled 2022-02-09 (×2): qty 0.25

## 2022-02-09 MED ORDER — OSMOLITE 1.5 CAL PO LIQD
700.0000 mL | ORAL | Status: DC
  Administered 2022-02-09 – 2022-02-10 (×2): 700 mL
  Filled 2022-02-09: qty 711
  Filled 2022-02-09: qty 1000

## 2022-02-09 MED ORDER — QUETIAPINE FUMARATE 50 MG PO TABS
50.0000 mg | ORAL_TABLET | Freq: Three times a day (TID) | ORAL | Status: DC | PRN
  Administered 2022-02-09 – 2022-03-05 (×19): 50 mg via ORAL
  Filled 2022-02-09 (×21): qty 1

## 2022-02-09 MED ORDER — METHYLPHENIDATE HCL 5 MG PO TABS
10.0000 mg | ORAL_TABLET | Freq: Every day | ORAL | Status: DC
  Administered 2022-02-10: 10 mg
  Filled 2022-02-09 (×2): qty 2

## 2022-02-09 NOTE — Progress Notes (Signed)
Patient ID: Dominic Smith, male   DOB: 1940-11-14, 81 y.o.   MRN: [id number] Films reviewed. Interval healing seen. Does not need Cervical brace. May be removed. If he complains of pain please notify us, and place collar.

## 2022-02-09 NOTE — Progress Notes (Addendum)
PROGRESS NOTE   Subjective/Complaints: Intermittent agitation persists, particularly in late afternoon. Was agitated this morning again. Only ate 50% and 25% yesterday, no dinner.   ROS: Limited due to cognitive/behavioral    Objective:   DG Cerv Spine Flex&Ext Only  Result Date: 02/08/2022 CLINICAL DATA:  History of cervical fracture EXAM: CERVICAL SPINE - FLEXION AND EXTENSION VIEWS ONLY COMPARISON:  CT 01/19/2022, 01/10/2022 FINDINGS: Positioning is limited secondary to patient physical condition. Visibility of lower cervical spine is limited due to soft tissue and osseous artifact from shoulders. Advanced degenerative changes C3-C4 and C5-C6. Trace anterolisthesis C4 on C5. Known C4, C6 and C7 fractures are not well seen radiographically. IMPRESSION: Limited by positioning. Trace anterolisthesis C4 on C5. No gross suspicious change in alignment between flexion and extension but note that there is limited visibility at C6 and C7 in the region of known fractures. Electronically Signed   By: Donavan Foil M.D.   On: 02/08/2022 18:38    Recent Labs    02/07/22 0558 02/09/22 0549  WBC 9.3 8.5  HGB 8.1* 9.1*  HCT 26.2* 28.4*  PLT 331 343     Recent Labs    02/07/22 0558 02/09/22 0549  NA 141 143  K 4.3 3.9  CL 109 110  CO2 24 23  GLUCOSE 96 105*  BUN 26* 22  CREATININE 1.22 1.04  CALCIUM 8.6* 8.9      Intake/Output Summary (Last 24 hours) at 02/09/2022 [id number] Last data filed at 02/09/2022 0700 Gross per 24 hour  Intake 2942.69 ml  Output 3310 ml  Net -367.31 ml      Pressure Injury 01/13/22 Coccyx Mid Unstageable - Full thickness tissue loss in which the base of the injury is covered by slough (yellow, tan, gray, green or brown) and/or eschar (tan, brown or black) in the wound bed. skin is broken with redness at the (Active)  01/13/22 1930  Location: Coccyx  Location Orientation: Mid  Staging: Unstageable -  Full thickness tissue loss in which the base of the injury is covered by slough (yellow, tan, gray, green or brown) and/or eschar (tan, brown or black) in the wound bed.  Wound Description (Comments): skin is broken with redness at the base  Present on Admission: Yes (present on admission to rehab 9/27)    Physical Exam: Vital Signs Blood pressure (!) 140/84, pulse 94, temperature 97.8 F (36.6 C), temperature source Oral, resp. rate 20, height 5\' 11"  (1.803 m), weight 66.7 kg, SpO2 100 %.  Constitutional: No distress . Vital signs reviewed. HEENT: NCAT, EOMI, oral membranes moist Neck: supple Cardiovascular: RRR without murmur. No JVD    Respiratory/Chest: CTA Bilaterally without wheezes or rales. Normal effort    GI/Abdomen: BS +, non-tender, non-distended Ext: no clubbing, cyanosis, or edema Psych: confused and distracted Skin: wounds at knees, both hands, left elbow, right ankle.  Sacral wound present, unstageable and dressed. Foam dressings at based of collar  Neuro: alert. Speech a little more slurred. Michela Pitcher he was at hospital and in [employer]   Moves all 4 limbs spontaneously but difficut to grade.   Musculoskeletal: no focal jt pain, nl prom. Right arm in sling  Assessment/Plan: 1. Functional deficits which require 3+ hours per day of interdisciplinary therapy in a comprehensive inpatient rehab setting. Physiatrist is providing close team supervision and 24 hour management of active medical problems listed below. Physiatrist and rehab team continue to assess barriers to discharge/monitor patient progress toward functional and medical goals  Care Tool:  Bathing        Body parts bathed by helper: Right arm, Left arm, Chest, Abdomen, Front perineal area, Buttocks, Right upper leg, Left upper leg, Right lower leg, Left lower leg, Face     Bathing assist Assist Level: 2 Helpers     Upper Body Dressing/Undressing Upper body dressing   What is the patient wearing?:  Pull over shirt    Upper body assist Assist Level: Dependent - Patient 0%    Lower Body Dressing/Undressing Lower body dressing            Lower body assist Assist for lower body dressing: 2 Helpers     Toileting Toileting    Toileting assist Assist for toileting: Moderate Assistance - Patient 50 - 74%     Transfers Chair/bed transfer  Transfers assist     Chair/bed transfer assist level: Moderate Assistance - Patient 50 - 74%     Locomotion Ambulation   Ambulation assist   Ambulation activity did not occur: Safety/medical concerns  Assist level: Moderate Assistance - Patient 50 - 74% Assistive device: Hand held assist Max distance: 180   Walk 10 feet activity   Assist  Walk 10 feet activity did not occur: Safety/medical concerns  Assist level: Moderate Assistance - Patient - 50 - 74% Assistive device: Hand held assist   Walk 50 feet activity   Assist Walk 50 feet with 2 turns activity did not occur: Safety/medical concerns  Assist level: Moderate Assistance - Patient - 50 - 74% Assistive device: Hand held assist    Walk 150 feet activity   Assist Walk 150 feet activity did not occur: Safety/medical concerns  Assist level: Moderate Assistance - Patient - 50 - 74% Assistive device: Hand held assist    Walk 10 feet on uneven surface  activity   Assist Walk 10 feet on uneven surfaces activity did not occur: Safety/medical concerns         Wheelchair     Assist Is the patient using a wheelchair?: Yes Type of Wheelchair: Manual    Wheelchair assist level: Dependent - Patient 0% (TIS)      Wheelchair 50 feet with 2 turns activity    Assist    Wheelchair 50 feet with 2 turns activity did not occur: Safety/medical concerns       Wheelchair 150 feet activity     Assist  Wheelchair 150 feet activity did not occur: Safety/medical concerns       Blood pressure (!) 140/84, pulse 94, temperature 97.8 F (36.6 C),  temperature source Oral, resp. rate 20, height 5\' 11"  (1.803 m), weight 66.7 kg, SpO2 100 %.  Medical Problem List and Plan: 1. 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 Supervision goals  -Continue CIR therapies including PT, OT, and SLP   -discussed clinical status/plan with son [name] today. He's on board with plan. See discussion re: PEG below 2.  Antithrombotics: -DVT/anticoagulation:  Pharmaceutical: Lovenox 30 mg BID             -  antiplatelet therapy: aspirin 81 mg daily 3. Pain Management: continue Tylenol per tube scheduled,\; oxycodone, Robaxin prn 4. 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    -will dc seroquel and begin trial of risperdal given dementia component 5. Neuropsych/cognition: This patient is not capable of making decisions on his own behalf.               6. Skin/Wound Care: routine skin care checks             -monitor scalp lac/skin abrasions             -appreciate WOC recs for unstageable sacral/coccyx pressure wound   -continue medihoney/foam dressing 7. Fluids/Electrolytes/Nutrition/dysphagia: Strict Is and Os and follow-up chemistries             -MBS --diet initiated D2/thins  10/12-decided to keep cortrak in for now, decreased HS TF  -pt appears to have eaten a little more this morning  -discussed PO intake with pt. Demonstrated SOME insight  -BMET WNL  -added megace to help stimulate appetite on 10/12  -10/18 still with poor and inconsistent po intake   -will increase TF given prealbumin and RD recs    -remaining labs look ok   -I spoke with his son [name] today and he's on board with PEG. He would like to speak with his dad first today, however.  8: Right distal  clavicle and humerus fracture:  -continue sling; follow-up with Dr. Marcelino Scot on Monday -NWB RUE, pendulum activiites  9: Right 1 st rib fracture: pneumothorax resolved; IS/FV 10: C5-C6, C4 and C7 transverse process fractures:  10/18 -xray images of c-spine yesterday weren't great -will reach out to NS regarding need for collar. He's a month out now 11: Possible right VA and ICA injuries: continue daily aspirin 13: Urinary retention: has indwelling Foley catheter             -continue cardura    10/8 -Replaced foley cath d/t urinary retention and bleeding/irritation with IC  10/10 will need catheter at discharge 15: SAH:  completed 7 days of Keppra; follow-up with neurosurgery 16: Volume overload/peripheral edema: received lasix 40mg  x 1 last week             -strict Is and Os and daily weights              Filed Weights   02/07/22 0507 02/08/22 0500 02/09/22 0404  Weight: 59 kg 64.9 kg 66.7 kg    -weights are inconsistent 17: AKI: see above 18: Elevated transaminases: trending downward; follow-up CMP tomorrow 19:  ?aspiration pneumonia w/x hx of staph and pseudomonas - vancomycin and cefepime total of 7 more days ended 10/5             -10/16 wbcs 9.3 20: SVT/sinus tach intermittently: monitor 21: Mediastinal hematoma: Echo WNL; Hgb stable 22: Ankle edema and ecchymosis: x-rays negative for osseous abnormality           Continue TEDS, elevation in bed 23. Loose stools. No s/s infectious diarrhea. Likely d/t abx and Tfs. Getting lomotil PRN.    -expect further improvement off TF (in addition to abx)  -10/8 Last BM  --sorbitol today  10/15: messaged nursing to determine last BM, was  12th, will add miralax daily  10/18 no results with sorbitol x2, fleet enema not attempted yesterday 24. Normocytic anemia/ABLA: likely related to hospitalization and chronic disease  -no gross blood loss  continue b complex and iron  -10/18 9.1 25. Hypoglycemia:    10/16--he's not diabetic. Dc cbg  checks.     LOS: 21 days A FACE TO FACE EVALUATION WAS PERFORMED  Meredith Staggers 02/09/2022, 8:12 AM

## 2022-02-09 NOTE — Progress Notes (Signed)
Occupational Therapy Weekly Progress Note  Patient Details  Name: Dominic Smith MRN: [id number] Date of Birth: May 31, 1940  Beginning of progress report period: February 03, 2022 End of progress report period: February 09, 2022   Patient has met 2 of 4 short term goals.  Pt has made ver slow progress this reporting period improving briefly to MOD A for ADLs at the sink level, however pt has not been consistent with DAL performance and has been having increased confusion and agitation with staff impacting ability to participate in functional tasks. Pt family has decided to pursue SNF d/t CLOF and no one to provide 24/7 care. Goals were downgraded to MOD A overall to demonstra  Patient continues to demonstrate the following deficits: muscle weakness, decreased cardiorespiratoy endurance, impaired timing and sequencing, decreased coordination, and decreased motor planning, decreased visual perceptual skills, decreased motor planning, decreased initiation, decreased attention, decreased awareness, decreased problem solving, decreased safety awareness, decreased memory, delayed processing, and demonstrates behaviors consistent with [place] Level [place], and decreased standing balance, decreased postural control, decreased balance strategies, and difficulty maintaining precautions and therefore will continue to benefit from skilled OT intervention to enhance overall performance with BADL and Reduce care partner burden.  Patient not progressing toward long term goals.  See goal revision..  Plan of care revisions: MOD A dressing UB/LB, MOD A toileting, MIN A feeding, MIN A grooming, MIN A transfers.  OT Short Term Goals Week 3:  OT Short Term Goal 1 (Week 3): Pt will complete 1/3 steps of donning shirt OT Short Term Goal 1 - Progress (Week 3): Met OT Short Term Goal 2 (Week 3): Pt will compelte toilet transfer with MIN A consistently OT Short Term Goal 2 - Progress (Week 3): Progressing toward goal OT Short  Term Goal 3 (Week 3): Pt will wash 75% of body with MOD cuing for sequencing OT Short Term Goal 3 - Progress (Week 3): Met OT Short Term Goal 4 (Week 3): Pt will complete 1/3 steps of dressing OT Short Term Goal 4 - Progress (Week 3): Progressing toward goal Week 4:  OT Short Term Goal 1 (Week 4): Pt will complete toilet transfer with MIN A consistently OT Short Term Goal 2 (Week 4): Pt will complete 1/3 steps of LB dressing OT Short Term Goal 3 (Week 4): Pt will be oriented to place 25% of encounters    [name] 02/09/2022, 7:01 AM

## 2022-02-09 NOTE — Progress Notes (Signed)
Patient ID: Dominic Smith, male   DOB: Apr 28, 1940, 81 y.o.   MRN: [id number] BP (!) 140/84 (BP Location: Left Arm)   Pulse 94   Temp 97.8 F (36.6 C) (Oral)   Resp 20   Ht 5\' 11"  (1.803 m)   Wt 66.7 kg   SpO2 100%   BMI 20.50 kg/m  Flexion and extension views reviewed. Not clear, nor good enough to decide if collar is needed. Also upon reviewing the two previous CT scans there are significant differences between the two. Believe a new CT is needed. The second scan states there were no comparisons when clearly there were. Have ordered the scan.

## 2022-02-09 NOTE — Progress Notes (Signed)
Speech Language Pathology TBI Note  Patient Details  Name: Dominic Smith MRN: [id number] Date of Birth: 1940-07-16  Today's Date: 02/09/2022 SLP Individual Time: 1030-1055 SLP Individual Time Calculation (min): 25 min and Today's Date: 02/09/2022 SLP Missed Time: 20 Minutes Missed Time Reason: Patient fatigue  Short Term Goals: Week 3: SLP Short Term Goal 1 (Week 3): Patient will consume current diet with minimal overt s/s of aspiration and Mod verbal cues for use of swallowing compensatory strategies. SLP Short Term Goal 2 (Week 3): Patient will orient to place and month with Max A multimodal cues. SLP Short Term Goal 3 (Week 3): Patient will follow commands throughout functional tasks in 50% of opportunities with Max multimodal cues. SLP Short Term Goal 4 (Week 3): Patient will demonstate sustained attenton to functional tasks for 2 minutes with Max A multimodal cues for redirection.  Skilled Therapeutic Interventions: Skilled treatment session focused on cognitive goals. Patient missed the initial 15 minutes of session due to fatigue. Patient awake in bed and SLP when to check on patient again and patient requesting thin liquids. Patient repositioned in bed and consumed a sip of thin liquids via straw. No overt s/s of aspiration observed but patient did appear to take too large of a sip from the straw with decreased oral control. Recommend ongoing full supervision with liquids and solids. Patient appeared lethargic throughout session but with improved orientation and awareness today. Patient required total A for orientation to place but was independently oriented to situation despite confabulating the details of the story intermittently. Patient also required Mod A verbal cues to utilize a calendar efficiently for recall of date. Patient also recalling some information from previous therapy session with PT. Patient requesting to go home with SLP providing education regarding current deficits  and goals of care. Patient falling asleep at end of session. Patient left upright in bed with alarm on and all needs within reach. Continue with current plan of care.      Pain No/Denies   Agitated Behavior Scale: TBI Observation Details Observation Environment: [facility] Start of observation period - Date: 02/09/22 Start of observation period - Time: 1030 End of observation period - Date: 02/09/22 End of observation period - Time: 1055 Agitated Behavior Scale (DO NOT LEAVE BLANKS) Short attention span, easy distractibility, inability to concentrate: Present to a moderate degree Impulsive, impatient, low tolerance for pain or frustration: Absent Uncooperative, resistant to care, demanding: Absent Violent and/or threatening violence toward people or property: Absent Explosive and/or unpredictable anger: Absent Rocking, rubbing, moaning, or other self-stimulating behavior: Absent Pulling at tubes, restraints, etc.: Absent Wandering from treatment areas: Absent Restlessness, pacing, excessive movement: Present to a slight degree Repetitive behaviors, motor, and/or verbal: Present to a slight degree Rapid, loud, or excessive talking: Absent Sudden changes of mood: Absent Easily initiated or excessive crying and/or laughter: Absent Self-abusiveness, physical and/or verbal: Absent Agitated behavior scale total score: 18  Therapy/Group: Individual Therapy  Christyanna Mckeon 02/09/2022, 1:33 PM

## 2022-02-09 NOTE — Progress Notes (Signed)
Occupational Therapy TBI Note  Patient Details  Name: Dominic Smith MRN: [id number] Date of Birth: 11/10/1940  Today's Date: 02/09/2022 OT Missed Time: 12 Minutes Missed Time Reason:  (increased agitation and confusion)    Skilled Therapeutic Interventions/Progress Updates:    Pt missed 45 mins skilled OT services 2/2 increased agitation/confusion.  Therapy Documentation Precautions:  Precautions Precautions: Fall, Cervical Precaution Booklet Issued: No Precaution Comments: cortrak, bilateral soft mitts Required Braces or Orthoses: Sling, Cervical Brace Cervical Brace: At all times, Hard collar Restrictions Weight Bearing Restrictions: Yes RUE Weight Bearing: Non weight bearing Other Position/Activity Restrictions: in sling, nonoperative mgmt per ortho 9/18 General: General OT Amount of Missed Time: 78 Minutes Agitated Behavior Scale: TBI Observation Details Observation Environment: [facility] Start of observation period - Date: 02/09/22 Start of observation period - Time: 1400 End of observation period - Date: 02/09/22 End of observation period - Time: 1402 Agitated Behavior Scale (DO NOT LEAVE BLANKS) Short attention span, easy distractibility, inability to concentrate: Present to a moderate degree Impulsive, impatient, low tolerance for pain or frustration: Present to a moderate degree Uncooperative, resistant to care, demanding: Present to a moderate degree Violent and/or threatening violence toward people or property: Present to a slight degree Explosive and/or unpredictable anger: Present to a moderate degree Rocking, rubbing, moaning, or other self-stimulating behavior: Present to a slight degree Pulling at tubes, restraints, etc.: Present to a moderate degree Wandering from treatment areas: Absent Restlessness, pacing, excessive movement: Present to a slight degree Repetitive behaviors, motor, and/or verbal: Present to a moderate degree Rapid, loud, or excessive  talking: Present to a slight degree Sudden changes of mood: Present to a slight degree Easily initiated or excessive crying and/or laughter: Absent Self-abusiveness, physical and/or verbal: Absent Agitated behavior scale total score: 31     [name] 02/09/2022, 2:13 PM

## 2022-02-09 NOTE — Progress Notes (Signed)
Pleural effusion noted on CT of c-spine. Will get updated upright chest xray.

## 2022-02-09 NOTE — Progress Notes (Signed)
Patient has been restless and has attempted to climb out of bed many times throughout the course of shift. MD and PA notified. Care ongoing.    Gladstone Lighter, LPN

## 2022-02-09 NOTE — Progress Notes (Signed)
Occupational Therapy Session Note  Patient Details  Name: Dominic Smith MRN: [id number] Date of Birth: Feb 09, 1941  Today's Date: 02/09/2022 OT Individual Time: 1130-1145 OT Individual Time Calculation (min): 15 min  and Today's Date: 02/09/2022 OT Missed Time: 15 Minutes Missed Time Reason: Patient fatigue (pt with increased lethargy)   Short Term Goals: Week 3:  OT Short Term Goal 1 (Week 3): Pt will complete 1/3 steps of donning shirt OT Short Term Goal 1 - Progress (Week 3): Met OT Short Term Goal 2 (Week 3): Pt will compelte toilet transfer with MIN A consistently OT Short Term Goal 2 - Progress (Week 3): Progressing toward goal OT Short Term Goal 3 (Week 3): Pt will wash 75% of body with MOD cuing for sequencing OT Short Term Goal 3 - Progress (Week 3): Met OT Short Term Goal 4 (Week 3): Pt will complete 1/3 steps of dressing OT Short Term Goal 4 - Progress (Week 3): Progressing toward goal  Skilled Therapeutic Interventions/Progress Updates:    Pt in bed upon arrival. BUE mitts donned and waist belt secure. Pt noticed OTA standing at door as asked what OTA was doing. Pt reported BLE spasms as noted below. Pt with increased lethargy and stated he was trying to "go to sleep" but was unable. Attempted to have pt employ relaxation strategies/techniques with no success. Pt with increased lethargy and unable to attend to task. Pt remained in bed as mention above. Pt missed 15 mins skilled OT services.  Therapy Documentation Precautions:  Precautions Precautions: Fall, Cervical Precaution Booklet Issued: No Precaution Comments: cortrak, bilateral soft mitts Required Braces or Orthoses: Sling, Cervical Brace Cervical Brace: At all times, Hard collar Restrictions Weight Bearing Restrictions: Yes RUE Weight Bearing: Non weight bearing Other Position/Activity Restrictions: in sling, nonoperative mgmt per ortho 9/18 General: General OT Amount of Missed Time: 15 Minutes  Pain:   Pt c/o BLE spasms, emotional support, offered to reposition but pt declined   Therapy/Group: Individual Therapy  [name] 02/09/2022, 11:54 AM

## 2022-02-09 NOTE — Progress Notes (Signed)
Patient with significant sundowning and need for waist restraints. C-spine films updated yesterday and call placed this morning to NS to review films and decide on length of need for Aspen collar.

## 2022-02-09 NOTE — Progress Notes (Signed)
Physical Therapy Session Note  Patient Details  Name: Dominic Smith MRN: [id number] Date of Birth: 1940/08/29  Today's Date: 02/09/2022 PT Individual Time: 0900-1000 PT Individual Time Calculation (min): 60 min   Short Term Goals: Week 3:  PT Short Term Goal 1 (Week 3): Patient will perform bed mobility with min A >50% of the time. PT Short Term Goal 2 (Week 3): Patient will perform basic transfers with min A consistently. PT Short Term Goal 3 (Week 3): Patient will ambulate >300 ft with min A without need for additional assist for LOB. PT Short Term Goal 4 (Week 3): Patient will participate in a formal balance assessment.  Skilled Therapeutic Interventions/Progress Updates:     Patient in bed with c-collar askew and Velcro unattached on the L upon PT arrival. Patient alert and agreeable to PT session. Patient reported un-rated R arm pain during session, RN made aware. PT provided repositioning, rest breaks, and distraction as pain interventions throughout session.   Patient reports poor sleep and frustration with staff last night. Educated patient on TBI agitation, hospital safety protocols, and interventions in place and safety purpose of each intervention. Patient stated understanding at this time, will need continued reinforcement due to cognitive deficits. Patient oriented to self, location, and situation, required total A for orientation to time.   PT applied B thigh high TED hose and B ACE wraps, abdominal binder donned prior to session.   Therapeutic Activity: Bed Mobility: Patient performed supine to/from sit with mod A with HOB elevated. Provided verbal cues for sequencing and initiation. Patient reported increased fatigue in sitting EOB, noted increased L and posterior lean in sitting today. Patient requested to return to lying with max A for trunk and lower extremity control.   Patient required increased time for behavior management with good response to orientation and  communicating what is happening before it is done, for initiation of mobility, and cuing throughout session. Utilized therapeutic use of self throughout to promote efficiency.   Patient in bed with soft waist belt secured and stockinette and B hand mitts secured at end of session with breaks locked, bed alarm set, 4 rails up for patient safety, and all needs within reach. Provide  Therapy Documentation Precautions:  Precautions Precautions: Fall, Cervical Precaution Booklet Issued: No Precaution Comments: cortrak, bilateral soft mitts Required Braces or Orthoses: Sling, Cervical Brace Cervical Brace: At all times, Hard collar Restrictions Weight Bearing Restrictions: Yes RUE Weight Bearing: Non weight bearing Other Position/Activity Restrictions: in sling, nonoperative mgmt per ortho 9/18    Therapy/Group: Individual Therapy  Benito Lemmerman L Tristen Pennino PT, DPT, NCS, CBIS  02/09/2022, 4:57 PM

## 2022-02-10 LAB — GLUCOSE, CAPILLARY
Glucose-Capillary: 114 mg/dL — ABNORMAL HIGH (ref 70–99)
Glucose-Capillary: 133 mg/dL — ABNORMAL HIGH (ref 70–99)
Glucose-Capillary: 95 mg/dL (ref 70–99)
Glucose-Capillary: 77 mg/dL (ref 70–99)
Glucose-Capillary: 88 mg/dL (ref 70–99)

## 2022-02-10 MED ORDER — RISPERIDONE 1 MG/ML PO SOLN
1.0000 mg | Freq: Every day | ORAL | Status: DC
  Administered 2022-02-10: 1 mg via ORAL
  Filled 2022-02-10: qty 1

## 2022-02-10 MED ORDER — SORBITOL 70 % SOLN: 60.0000 mL | Status: AC

## 2022-02-10 MED ORDER — RISPERIDONE 1 MG/ML PO SOLN
0.5000 mg | Freq: Every day | ORAL | Status: DC
  Administered 2022-02-10: 0.5 mg via ORAL
  Filled 2022-02-10 (×2): qty 0.5

## 2022-02-10 NOTE — Progress Notes (Signed)
Pt awake all night, continuously attempting to swing legs over rails and get up for multiple different reasons. Such as; getting keys from car to lock it, getting ready for work, looking for a family friend, to brush his hair and leave, put his shoes on, etc. Reorientation attempted multiple times and was unsuccessful, pt only becomes more irritated. Through out night had periods of aggression, attempting to kick and punch staff. Pt was able to kick NT twice on this shift.  Prn Seroquel attempted, no changes noted. Staff continued to reposition pt in center of bed. Telesitter called numerous times to inform staff of pt being with legs over rails.

## 2022-02-10 NOTE — Plan of Care (Signed)
After speaking with the care team and as discussed with MD in team conference, the patient's plan of care has been adjusted to 15/7 as the patient is currently unable to tolerate the current therapy schedule with OT, PT, and SLP.

## 2022-02-10 NOTE — Progress Notes (Signed)
Occupational Therapy TBI Note  Patient Details  Name: Rondel Episcopo MRN: [id number] Date of Birth: [birth year]  Today's Date: 02/10/2022 OT Individual Time: 9450-3888 OT Individual Time Calculation (min): 11 min  and Today's Date: 02/10/2022 OT Missed Time: 64 Minutes Missed Time Reason: Patient fatigue (up all night per staff note and sleep chart. unarousable, sleeping soundly)  Today's Date: 02/10/2022 OT Individual Time: 1300-1330 OT Individual Time Calculation (min): 30 min   Short Term Goals: Week 1:  OT Short Term Goal 1 (Week 1): Pt will consistently transfer to toilet with 1 caregiver OT Short Term Goal 1 - Progress (Week 1): Met OT Short Term Goal 2 (Week 1): Pt will complete 1/4 steps of donning shirt OT Short Term Goal 2 - Progress (Week 1): Progressing toward goal OT Short Term Goal 3 (Week 1): Pt will complete oral care with MOD A OT Short Term Goal 3 - Progress (Week 1): Progressing toward goal OT Short Term Goal 4 (Week 1): Pt will sit to stand with MOD A or less OT Short Term Goal 4 - Progress (Week 1): Met  Skilled Therapeutic Interventions/Progress Updates:     Pt received in bed asleep. Attempted multiple times to arouse pt, however per RN, sleep chart and chart pt awake all night restless and agitated. Pt briefly opening eyes to sternal rub 2x but immediately falling back asleep. Pt missed 64 minutes of tx d/t lethargy/fatigue.  Returned 45 min later, however pt still sleeping soundly and unarousable to sternal rub. Pt remains in bed with restraints in place asleep   Session 2: Pt asleep finished with lunch and NT in room setting up new bed. Pt transported out to dayroom and set at table near window to see sunshine. Pt states, "it is a pretty day" and when asked where we were says,"It doesn't matter because God is all around [place]." Engaged pt in card sorting task for focused attention and command following. Pt able to sort cards with 60% success. Exited session with  pt seated in TIS direct handoff to SLP.   Therapy Documentation Precautions:  Precautions Precautions: Fall, Cervical Precaution Booklet Issued: No Precaution Comments: cortrak, bilateral soft mitts Required Braces or Orthoses: Sling, Cervical Brace Cervical Brace: At all times, Hard collar Restrictions Weight Bearing Restrictions: Yes RUE Weight Bearing: Non weight bearing Other Position/Activity Restrictions: in sling, nonoperative mgmt per ortho 9/18 General: General OT Amount of Missed Time: 64 Minutes Agitated Behavior Scale: TBI Observation Details Observation Environment: [facility] Start of observation period - Date: 02/10/22 Start of observation period - Time: 1300 End of observation period - Date: 02/10/22 End of observation period - Time: 1330 Agitated Behavior Scale (DO NOT LEAVE BLANKS) Short attention span, easy distractibility, inability to concentrate: Present to a moderate degree Impulsive, impatient, low tolerance for pain or frustration: Present to a slight degree Uncooperative, resistant to care, demanding: Absent Violent and/or threatening violence toward people or property: Absent Explosive and/or unpredictable anger: Absent Rocking, rubbing, moaning, or other self-stimulating behavior: Absent Pulling at tubes, restraints, etc.: Absent Wandering from treatment areas: Absent Restlessness, pacing, excessive movement: Present to a moderate degree Repetitive behaviors, motor, and/or verbal: Present to a slight degree Rapid, loud, or excessive talking: Absent Sudden changes of mood: Absent Easily initiated or excessive crying and/or laughter: Absent Self-abusiveness, physical and/or verbal: Absent Agitated behavior scale total score: 20  Therapy/Group: Individual Therapy  [name] 02/10/2022, 7:17 AM

## 2022-02-10 NOTE — Progress Notes (Signed)
Speech Language Pathology Weekly Progress and Session Note  Patient Details  Name: Dominic Smith MRN: [id number] Date of Birth: [birth year]  Beginning of progress report period: February 03, 2022 End of progress report period: February 10, 2022  Today's Date: 02/10/2022 SLP Individual Time: 2924-4628 SLP Individual Time Calculation (min): 25 min and Today's Date: 02/10/2022 SLP Missed Time: 20 Minutes Missed Time Reason: Patient fatigue  Short Term Goals: Week 3: SLP Short Term Goal 1 (Week 3): Patient will consume current diet with minimal overt s/s of aspiration and Mod verbal cues for use of swallowing compensatory strategies. SLP Short Term Goal 1 - Progress (Week 3): Met SLP Short Term Goal 2 (Week 3): Patient will orient to place and month with Max A multimodal cues. SLP Short Term Goal 2 - Progress (Week 3): Met SLP Short Term Goal 3 (Week 3): Patient will follow commands throughout functional tasks in 50% of opportunities with Max multimodal cues. SLP Short Term Goal 3 - Progress (Week 3): Met SLP Short Term Goal 4 (Week 3): Patient will demonstate sustained attenton to functional tasks for 2 minutes with Max A multimodal cues for redirection. SLP Short Term Goal 4 - Progress (Week 3): Met    New Short Term Goals: Week 4: SLP Short Term Goal 1 (Week 4): Patient will consume current diet with minimal overt s/s of aspiration and Min verbal cues for use of swallowing compensatory strategies. SLP Short Term Goal 2 (Week 4): Patient will orient to place and month with Mod A multimodal cues. SLP Short Term Goal 3 (Week 4): Patient will follow commands throughout functional tasks in 75% of opportunities with Max multimodal cues. SLP Short Term Goal 4 (Week 4): Patient will demonstate sustained attenton to functional tasks for 5 minutes with Max A multimodal cues for redirection.  Weekly Progress Updates: Patient has made slow and inconsistent gains and has met 4 of 4 STGs this  reporting period. Currently, patient demonstrates behaviors consistent with a Rancho Level V and requires overall Max A multimodal cues to complete functional and familiar tasks safely in regards to attention, initiation, and orientation. Max-Total A is needed at times for recall of functional information and problem solving. Function is also impacted by fatigue, language of confusion, confabulation, and intermittent agitation. Patient is consuming Dys. 2 textures with thin liquids with minimal overt s/s of aspiration with Mod verbal cues for use of swallowing compensatory strategies/attention to bolus. Recommend patient continue current diet. Patient and family education ongoing. Patient would benefit from continued skilled SLP intervention to maximize his swallowing and cognitive functioning prior to discharge.     Intensity: Minumum of 1-2 x/day, 30 to 90 minutes Frequency: 3 to 5 out of 7 days Duration/Length of Stay: TBD due to SNF placement Treatment/Interventions: Cognitive remediation/compensation;Dysphagia/aspiration precaution training;Speech/Language facilitation;Internal/external aids;Cueing hierarchy;Environmental controls;Therapeutic Activities;Functional tasks;Patient/family education   Daily Session  Skilled Therapeutic Interventions:  Skilled treatment session focused on cognitive goals. Patient received from OT and appeared lethargic. Patient oriented to place and city but required Max verbal cues for orientation to time and name of hospital. Patient with language of confusion with eyes consistently closing and requesting to get back into bed when questioned. Patient saw the enclosure bed in his room and thought it was a car and asking questions regarding price etc. Patient with language of confusion and confabulation throughout. Patient was easily redirected. Patient transferred back to bed and instantly fell asleep.Patient left secured in enclosure bed with all needs within reach.  Pain No/Denies Pain   Therapy/Group: Individual Therapy  Jacky Dross 02/10/2022, 6:56 AM

## 2022-02-10 NOTE — Progress Notes (Signed)
PROGRESS NOTE   Subjective/Complaints: Was quite agitated and restless last night despite meds. Sleeping this morning when I came in but aroused easily. Ate 75-50-90% yesterday.   ROS: Limited due to cognitive/behavioral   Objective:   CT HEAD WO CONTRAST (5MM)  Result Date: 02/09/2022 CLINICAL DATA:  Mental status change EXAM: CT HEAD WITHOUT CONTRAST TECHNIQUE: Contiguous axial images were obtained from the base of the skull through the vertex without intravenous contrast. RADIATION DOSE REDUCTION: This exam was performed according to the departmental dose-optimization program which includes automated exposure control, adjustment of the mA and/or kV according to patient size and/or use of iterative reconstruction technique. COMPARISON:  CT head 01/19/2022 FINDINGS: Brain: Bilateral subdural low-density fluid collections are decreased in size from 01/19/2022 measuring approximately 6 mm on the right and 4 mm on the left. No evidence of acute infarct or new hemorrhage. No hydrocephalus. Generalized cerebral atrophy. Ill-defined hypoattenuation within the cerebral white matter is nonspecific but consistent with chronic small vessel ischemic disease. Vascular: No hyperdense vessel. Intracranial arterial calcification. Skull: No fracture or focal lesion. Sinuses/Orbits: No acute finding. Chronic left maxillary and sphenoid sinusitis. Other: None. IMPRESSION: No acute intracranial abnormality. Decreased bilateral low-density subdural fluid collections compatible with chronic hematomas or hygromas. Electronically Signed   By: Placido Sou M.D.   On: 02/09/2022 23:33   DG Chest 2 View  Result Date: 02/09/2022 CLINICAL DATA:  Follow-up pleural effusion. EXAM: CHEST - 2 VIEW COMPARISON:  Radiograph 01/20/2022. Chest CT 01/10/2022 FINDINGS: Right pleural effusion is at least moderate in size, layering dependently. Pneumothorax on prior CT has  resolved. No definite left pleural effusion. The previous left perihilar airspace disease has improved. The heart is normal in size. Aortic atherosclerosis. Subacute right distal clavicle fracture. IMPRESSION: 1. Right pleural effusion, at least moderate in size, layering dependently. 2. Improving left perihilar airspace disease. Electronically Signed   By: Keith Rake M.D.   On: 02/09/2022 19:26   CT CERVICAL SPINE WO CONTRAST  Result Date: 02/09/2022 CLINICAL DATA:  Spine fracture, cervical, traumatic cspine fracture C5/6 EXAM: CT CERVICAL SPINE WITHOUT CONTRAST TECHNIQUE: Multidetector CT imaging of the cervical spine was performed without intravenous contrast. Multiplanar CT image reconstructions were also generated. RADIATION DOSE REDUCTION: This exam was performed according to the departmental dose-optimization program which includes automated exposure control, adjustment of the mA and/or kV according to patient size and/or use of iterative reconstruction technique. COMPARISON:  None Available. FINDINGS: Alignment: Unchanged degenerative anterolisthesis at C4-C5 and trace retrolisthesis at C3-C4. Unchanged degenerative anterolisthesis at T2-T3. Skull base and vertebrae: There has been interval healing with some bony bridging and sclerosis across the C6 vertebral body fracture. There is bony bridging across the previously seen splaying anteriorly. There are persistent minimal residual fracture lines along the periphery. Interval non bridging callus formation along the right C7 transverse process fracture. Interval non bridging callus formation at the anterior right first rib fracture and distal right clavicle fracture, partially visualized. No evidence of new acute fracture. Soft tissues and spinal canal: Nearly resolved hematoma in the left lower neck and upper mediastinum. Disc levels: There is multilevel degenerative disc disease and facet arthropathy, unchanged from prior. Upper chest:  Pleural  effusion extends to the right apex. Focal left apical ground-glass opacities. Other: None. IMPRESSION: Healing C6 vertebral body fracture with some bony bridging and sclerosis. Bony bridging across the previously seen splaying anteriorly. Minimal residual fracture lines along the periphery. Healing right C7 transverse process fracture in unchanged alignment. Partially visualized healing right anterior first rib and distal clavicle fractures. Pleural effusion extends to the right apex. Focal left apical ground-glass opacities, possibly infectious/inflammatory. Recommend chest radiograph. Electronically Signed   By: Maurine Simmering M.D.   On: 02/09/2022 12:19   DG Cerv Spine Flex&Ext Only  Result Date: 02/08/2022 CLINICAL DATA:  History of cervical fracture EXAM: CERVICAL SPINE - FLEXION AND EXTENSION VIEWS ONLY COMPARISON:  CT 01/19/2022, 01/10/2022 FINDINGS: Positioning is limited secondary to patient physical condition. Visibility of lower cervical spine is limited due to soft tissue and osseous artifact from shoulders. Advanced degenerative changes C3-C4 and C5-C6. Trace anterolisthesis C4 on C5. Known C4, C6 and C7 fractures are not well seen radiographically. IMPRESSION: Limited by positioning. Trace anterolisthesis C4 on C5. No gross suspicious change in alignment between flexion and extension but note that there is limited visibility at C6 and C7 in the region of known fractures. Electronically Signed   By: Donavan Foil M.D.   On: 02/08/2022 18:38    Recent Labs    02/09/22 0549  WBC 8.5  HGB 9.1*  HCT 28.4*  PLT 343     Recent Labs    02/09/22 0549  NA 143  K 3.9  CL 110  CO2 23  GLUCOSE 105*  BUN 22  CREATININE 1.04  CALCIUM 8.9      Intake/Output Summary (Last 24 hours) at 02/10/2022 0855 Last data filed at 02/10/2022 [id number] Gross per 24 hour  Intake 240 ml  Output 1700 ml  Net -1460 ml      Pressure Injury 01/13/22 Coccyx Mid Unstageable - Full thickness tissue loss in  which the base of the injury is covered by slough (yellow, tan, gray, green or brown) and/or eschar (tan, brown or black) in the wound bed. skin is broken with redness at the (Active)  01/13/22 1930  Location: Coccyx  Location Orientation: Mid  Staging: Unstageable - Full thickness tissue loss in which the base of the injury is covered by slough (yellow, tan, gray, green or brown) and/or eschar (tan, brown or black) in the wound bed.  Wound Description (Comments): skin is broken with redness at the base  Present on Admission: Yes (present on admission to rehab 9/27)    Physical Exam: Vital Signs Blood pressure (!) 158/67, pulse 89, temperature 98.3 F (36.8 C), temperature source Oral, resp. rate 18, height 5\' 11"  (1.803 m), weight 64.4 kg, SpO2 98 %.  Constitutional: No distress . Vital signs reviewed. HEENT: NCAT, EOMI, oral membranes moist, NGT Neck: supple, I removed collar Cardiovascular: RRR without murmur. No JVD    Respiratory/Chest: CTA Bilaterally without wheezes or rales. Normal effort    GI/Abdomen: BS +, non-tender, non-distended Ext: no clubbing, cyanosis, or edema Psych: remains confused and distracted Uro: foley in place, urine clear Skin: wounds at knees, both hands, left elbow, right ankle.  Sacral wound present, unstageable and dressed. Foam dressings at based of collar  Neuro: alert. Speech a little more slurred. Michela Pitcher he was at hospital and in [employer]   Moves all 4 limbs spontaneously but difficut to grade.   Musculoskeletal: no focal jt pain, nl prom. Right arm in sling    Assessment/Plan:  1. Functional deficits which require 3+ hours per day of interdisciplinary therapy in a comprehensive inpatient rehab setting. Physiatrist is providing close team supervision and 24 hour management of active medical problems listed below. Physiatrist and rehab team continue to assess barriers to discharge/monitor patient progress toward functional and medical  goals  Care Tool:  Bathing        Body parts bathed by helper: Right arm, Left arm, Chest, Abdomen, Front perineal area, Buttocks, Right upper leg, Left upper leg, Right lower leg, Left lower leg, Face     Bathing assist Assist Level: 2 Helpers     Upper Body Dressing/Undressing Upper body dressing   What is the patient wearing?: Pull over shirt    Upper body assist Assist Level: Dependent - Patient 0%    Lower Body Dressing/Undressing Lower body dressing            Lower body assist Assist for lower body dressing: 2 Helpers     Toileting Toileting    Toileting assist Assist for toileting: Moderate Assistance - Patient 50 - 74%     Transfers Chair/bed transfer  Transfers assist     Chair/bed transfer assist level: Moderate Assistance - Patient 50 - 74%     Locomotion Ambulation   Ambulation assist   Ambulation activity did not occur: Safety/medical concerns  Assist level: Moderate Assistance - Patient 50 - 74% Assistive device: Hand held assist Max distance: 180   Walk 10 feet activity   Assist  Walk 10 feet activity did not occur: Safety/medical concerns  Assist level: Moderate Assistance - Patient - 50 - 74% Assistive device: Hand held assist   Walk 50 feet activity   Assist Walk 50 feet with 2 turns activity did not occur: Safety/medical concerns  Assist level: Moderate Assistance - Patient - 50 - 74% Assistive device: Hand held assist    Walk 150 feet activity   Assist Walk 150 feet activity did not occur: Safety/medical concerns  Assist level: Moderate Assistance - Patient - 50 - 74% Assistive device: Hand held assist    Walk 10 feet on uneven surface  activity   Assist Walk 10 feet on uneven surfaces activity did not occur: Safety/medical concerns         Wheelchair     Assist Is the patient using a wheelchair?: Yes Type of Wheelchair: Manual    Wheelchair assist level: Dependent - Patient 0% (TIS)       Wheelchair 50 feet with 2 turns activity    Assist    Wheelchair 50 feet with 2 turns activity did not occur: Safety/medical concerns       Wheelchair 150 feet activity     Assist  Wheelchair 150 feet activity did not occur: Safety/medical concerns       Blood pressure (!) 158/67, pulse 89, temperature 98.3 F (36.8 C), temperature source Oral, resp. rate 18, height 5\' 11"  (1.803 m), weight 64.4 kg, SpO2 98 %.  Medical Problem List and Plan: 1. 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   -minimal progress d/t waxing and waning behavior which is due to premorbid dementia, age, and TBI    2.  Antithrombotics: -DVT/anticoagulation:  Pharmaceutical: Lovenox 30 mg BID             -antiplatelet therapy: aspirin 81  mg daily 3. Pain Management: continue Tylenol per tube scheduled,\; oxycodone, Robaxin prn 4. 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  HCT stable/improved   -transitioning to risperdal: 0.5mg  at 1200 and 1mg  at 2000    -prn seroquel   -removed c-collar which should help restlessness 5. Neuropsych/cognition: This patient is not capable of making decisions on his own behalf.               6. Skin/Wound Care: routine skin care checks             -monitor scalp lac/skin abrasions             -appreciate WOC recs for unstageable sacral/coccyx pressure wound   -continue medihoney/foam dressing 7. Fluids/Electrolytes/Nutrition/dysphagia: Strict Is and Os and follow-up chemistries             -MBS --diet initiated D2/thins  10/12-decided to keep cortrak in for now, decreased HS TF  -pt appears to have eaten a little more this morning  -discussed PO intake with pt.  Demonstrated SOME insight  -BMET WNL  -added megace to help stimulate appetite on 10/12  -10/19 ate much more yesterday   -would like to see how he does without NGT but he's receiving addnl fluid and meds thru it as well   -continue HS TF   -spoke with son yesterday about PEG. Will see how he does with intake today. Expect waxing and waning intake along with mental status 8: Right distal clavicle and humerus fracture:  -continue sling; follow-up with Dr. Marcelino Scot on Monday -NWB RUE, pendulum activiites  9: Right 1 st rib fracture: pneumothorax resolved; IS/FV 10: C5-C6, C4 and C7 transverse process fractures:  10/19 appreciate NS f/u re fx;s. 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: continue daily aspirin 13: Urinary retention: has indwelling Foley catheter             -continue cardura    10/8 -Replaced foley cath d/t urinary retention and bleeding/irritation with IC  10/10 will need catheter at discharge 15: SAH:  completed 7 days of Keppra; follow-up with neurosurgery 16: Volume overload/peripheral edema: received lasix 40mg  x 1 last week             -strict Is and Os and daily weights              Filed Weights   02/09/22 0404 02/09/22 1446 02/10/22 0500  Weight: 66.7 kg 66.7 kg 64.4 kg    -weights have been inconsistent 17: AKI: see above 18: Elevated transaminases: trending downward; follow-up CMP tomorrow 19:  ?aspiration pneumonia w/x hx of staph and pseudomonas - vancomycin and cefepime total of 7 more days ended 10/5             -10/16 wbcs 9.3 20: SVT/sinus tach intermittently: monitor 21: Mediastinal hematoma: Echo WNL; Hgb stable 22: Ankle edema and ecchymosis: x-rays negative for osseous abnormality           Continue TEDS, elevation in bed 23. Loose stools. No s/s infectious diarrhea. Likely d/t abx and Tfs. Getting lomotil PRN.    -expect further improvement off TF (in addition to abx)  -10/8 Last BM  --sorbitol today  10/15: messaged nursing to  determine  last BM, was 12th, will add miralax daily  10/19  no bm since 10/12 despite sorbitol   -will repeat sorbitol and administer SSE today 24. Normocytic anemia/ABLA: likely related to hospitalization and chronic disease  -no gross blood loss  continue b complex and iron  -10/18 9.1 25. Hypoglycemia:     -he's not diabetic. Dc'ed cbg checks.     LOS: 22 days A FACE TO [place] 02/10/2022, 8:55 AM

## 2022-02-10 NOTE — Progress Notes (Addendum)
Physical Therapy Weekly Progress Note  Patient Details  Name: Dominic Smith MRN: [id number] Date of Birth: October 08, 1940  Beginning of progress report period: February 03, 2022 End of progress report period: February 10, 2022  Today's Date: 02/10/2022 PT Individual Time: 8295-6213 and 1205-1225 PT Individual Time Calculation (min): 55 min and 20 min   Patient has met 0 of 3 short term goals.  Patient with poor progress with some functional decline this week due to night time arousal and agitation limiting arousal during the day. Patient was performing transfers and ambulating 100-200 feet with min A at the beginning of the week, however, x2 days has required max A for bed mobility and mod-max with transfers with increased posterior bias, and has not ambulated x2 days due to fatigue. Medical team is aware and rehab team is going to attempt increased mobility in the afternoon. Patient's son has been present for short periods and general BI education on cognitive, behavioral, and physical deficits has been initiated. Plan for d/c to SNF due to decreased assistance available  Patient continues to demonstrate the following deficits muscle weakness and muscle joint tightness, decreased cardiorespiratoy endurance, unbalanced muscle activation, decreased coordination, and decreased motor planning, decreased initiation, decreased attention, decreased awareness, decreased problem solving, decreased safety awareness, decreased memory, delayed processing, and demonstrates behaviors consistent with [place] IV-V, and decreased sitting balance, decreased standing balance, decreased postural control, decreased balance strategies, and difficulty maintaining precautions and therefore will continue to benefit from skilled PT intervention to increase functional independence with mobility.  Patient with slow progress toward long term goals.  Will monitor need for goal revisions.  Plan of care revisions: adjusted  patient's schedule to 15/7 for improved patient therapy tolerance and participation due to decreased daytime arousal and activity tolerance.  PT Short Term Goals Week 3:  PT Short Term Goal 1 (Week 3): Patient will perform bed mobility with min A >50% of the time. PT Short Term Goal 1 - Progress (Week 3): Not met PT Short Term Goal 2 (Week 3): Patient will perform basic transfers with min A consistently. PT Short Term Goal 2 - Progress (Week 3): Not met PT Short Term Goal 3 (Week 3): Patient will ambulate >300 ft with min A without need for additional assist for LOB. PT Short Term Goal 3 - Progress (Week 3): Not met PT Short Term Goal 4 (Week 3): Patient will participate in a formal balance assessment. PT Short Term Goal 4 - Progress (Week 3): Not met Week 4:  PT Short Term Goal 1 (Week 4): Patient will perform bed mobility with min A consistently. PT Short Term Goal 2 (Week 4): Patient will perform basic transfers with min A >75% of the time. PT Short Term Goal 3 (Week 4): Patient will ambulate >200 feet with min A using LRAD without additional assist for LOB. PT Short Term Goal 4 (Week 4): Patient will perform dynamic standing balance >2 min with min A.  Skilled Therapeutic Interventions/Progress Updates:     Session 1: Patient alert in bed with c-collar doffed per medical team orders, B mitts and soft waist belt secured.   Patient reports that he slept well, despite sleep chart and nursing report of limited to no sleep last night. Patient with reduced orientation this morning, oriented only to self. Provided gentle orientation and educated patient on TBI and physical deficits and interventions in place and safety purpose of each intervention. Patient stated understanding at this time, will need continued reinforcement due  to cognitive deficits.   PT applied B thigh high TED hose and B ACE wraps, abdominal binder donned prior to session. Patient dosing off intermittently during task.    Therapeutic Activity: Bed Mobility: Patient performed supine to/from sit with max A with HOB elevated. Provided verbal cues for sequencing and initiation. Patient reported increased fatigue in sitting EOB, noted increased L and posterior lean in sitting again today. Patient requested to return to lying with max A for trunk and lower extremity control.   Patient required increased time for behavior management with good response to orientation and communicating what is happening before it is done, for initiation of mobility, and cuing throughout session. Utilized therapeutic use of self throughout to promote efficiency.   Patient's son arrived and updated on patient's behavior and limited progress. Reports he plans to return this afternoon to encourage patient to participate OOB as able. Patient with intermittent interaction with his son due to fatigue.   Patient in bed asleep with soft waist belt secured and stockinette and B hand mitts secured at end of session with breaks locked, bed alarm set, 4 rails up for patient safety, and all needs within reach. Patient missed 20 min of skilled PT due to fatigue, RN made aware. Will attempt to make-up missed time as able.  Session 2:   PT returned to make up missed time from this morning. Patient in bed with NT in the room setting up lunch tray. Patient alert and agreeable to PT session, focused on transferring to TIS w/c to eat lunch and promote sitting OOB for increased daytime arousal.   Patient performed supine to sit with max A and +2 SBA due to strong posterior lean, progressed to min A for sitting balance with increased time and cues for forward trunk flexion. He performed stand pivot bed>TIS w/c with mod-max A, again with increased posterior lean. Provided max cuing for forward weight shift, initiating stepping for pivot, and awareness of seat location.   Patient's son called following transfer, spoke to patient and PT. Informed him that the patient  was sitting up to eat lunch and will be up for therapy this afternoon. Patient's son appreciative.   Patient in [place] w/c with soft waist belt secure, handed off to NT for full supervision with lunch.   Therapy Documentation Precautions:  Precautions Precautions: Fall, Cervical Precaution Booklet Issued: No Precaution Comments: cortrak, bilateral soft mitts Required Braces or Orthoses: Sling, Cervical Brace Cervical Brace: At all times, Hard collar Restrictions Weight Bearing Restrictions: Yes RUE Weight Bearing: Non weight bearing Other Position/Activity Restrictions: in sling, nonoperative mgmt per ortho 9/18   Therapy/Group: Individual Therapy  Rozelia Catapano L Ranald Alessio PT, DPT, NCS, CBIS  02/10/2022, 4:34 PM

## 2022-02-10 NOTE — Progress Notes (Signed)
Patient has increased agitation today. Continuing behavior of swinging legs over the side of the bed. Patient was able to swing legs over. Attempts at Reorienting unsuccessful. MD notified. Care ongoing.  Gladstone Lighter, LPN

## 2022-02-11 LAB — GLUCOSE, CAPILLARY
Glucose-Capillary: 100 mg/dL — ABNORMAL HIGH (ref 70–99)
Glucose-Capillary: 100 mg/dL — ABNORMAL HIGH (ref 70–99)
Glucose-Capillary: 100 mg/dL — ABNORMAL HIGH (ref 70–99)
Glucose-Capillary: 120 mg/dL — ABNORMAL HIGH (ref 70–99)
Glucose-Capillary: 134 mg/dL — ABNORMAL HIGH (ref 70–99)
Glucose-Capillary: 99 mg/dL (ref 70–99)

## 2022-02-11 MED ORDER — GUAIFENESIN 100 MG/5ML PO LIQD: 10.0000 mL | Freq: Four times a day (QID) | ORAL | Status: DC | PRN

## 2022-02-11 MED ORDER — ASPIRIN 81 MG PO CHEW
81.0000 mg | CHEWABLE_TABLET | Freq: Every day | ORAL | Status: DC
  Administered 2022-02-11 – 2022-02-23 (×13): 81 mg via ORAL
  Filled 2022-02-11 (×18): qty 1

## 2022-02-11 MED ORDER — B COMPLEX-C PO TABS: 1.0000 | ORAL_TABLET | Freq: Every day | ORAL | Status: DC

## 2022-02-11 MED ORDER — B COMPLEX-C PO TABS
1.0000 | ORAL_TABLET | Freq: Every day | ORAL | Status: DC
  Administered 2022-02-11 – 2022-03-08 (×26): 1 via ORAL
  Filled 2022-02-11 (×25): qty 1

## 2022-02-11 MED ORDER — SODIUM CHLORIDE 0.45 % IV SOLN: INTRAVENOUS | Status: DC

## 2022-02-11 MED ORDER — RISPERIDONE 0.5 MG PO TBDP
0.5000 mg | ORAL_TABLET | Freq: Every day | ORAL | Status: DC
  Administered 2022-02-11 – 2022-02-16 (×6): 0.5 mg via ORAL
  Filled 2022-02-11 (×7): qty 1

## 2022-02-11 MED ORDER — RISPERIDONE 1 MG PO TBDP
1.0000 mg | ORAL_TABLET | Freq: Every day | ORAL | Status: DC
  Administered 2022-02-11 – 2022-02-15 (×5): 1 mg via ORAL
  Filled 2022-02-11 (×6): qty 1

## 2022-02-11 MED ORDER — METHYLPHENIDATE HCL 5 MG PO TABS
10.0000 mg | ORAL_TABLET | Freq: Every day | ORAL | Status: DC
  Administered 2022-02-11 – 2022-02-18 (×8): 10 mg via ORAL
  Filled 2022-02-11 (×7): qty 2

## 2022-02-11 NOTE — Progress Notes (Signed)
Occupational Therapy Session Note  Patient Details  Name: Saron Tweed MRN: [id number] Date of Birth: 05-03-1940  Today's Date: 02/11/2022 OT Individual Time: 1002-1029 OT Individual Time Calculation (min): 27 min     Skilled Therapeutic Interventions/Progress Updates: Patient received in enclosure bed resting. Agreeable to get OOB for OT treatment. Prior to getting OOB OT oriented patient to place situation and injuries. Patient with no carry over from prior orientations. Patient oriented to date and unable to recall month or year within 2 minutes of being oriented using room calendar. Patient assisted OOB with min assist and cues not to bear wt through the RUE. Sling repositioning to reduce use of RUE with limited success. Patient assisted to sink to wash hands and comb hair. Patient stating he did need to do those things or be in the hospital, but willing to perform with some prompting. Continued to orient patient to injury and place. No significant agitation during treatment. Willingly returned to enclosure bed with B hand mitts in place.     Therapy Documentation Precautions:  Precautions Precautions: Fall, Cervical Precaution Booklet Issued: No Precaution Comments: cortrak, bilateral soft mitts Required Braces or Orthoses: Sling, Cervical Brace Cervical Brace: At all times, Hard collar Restrictions Weight Bearing Restrictions: Yes RUE Weight Bearing: Non weight bearing Other Position/Activity Restrictions: in sling, nonoperative mgmt per ortho 9/18 General:      Pain: Pain Assessment Pain Scale: 0-10 Pain Score: 0-No pain ADL  ADL Eating: NPO Grooming: Dependent Where Assessed-Grooming: Wheelchair Upper Body Bathing: Dependent Where Assessed-Upper Body Bathing: Wheelchair Lower Body Bathing: Dependent Where Assessed-Lower Body Bathing: Wheelchair Upper Body Dressing: Dependent Where Assessed-Upper Body Dressing: Wheelchair Lower Body Dressing: Dependent Where  Assessed-Lower Body Dressing: Software engineer Transfer: Dependent Toilet Transfer Method: Ambulating (MIn-MOD A for power up)    Therapy/Group: Individual Therapy  Hermina Barters 02/11/2022, 12:46 PM

## 2022-02-11 NOTE — Progress Notes (Signed)
Occupational Therapy TBI Note  Patient Details  Name: Dominic Smith MRN: [id number] Date of Birth: 08-Nov-1940  Today's Date: 02/11/2022 OT Individual Time: 0700-0743 OT Individual Time Calculation (min): 43 min    Today's Date: 02/11/2022 OT Individual Time: 1120-1200 OT Individual Time Calculation (min): 40 min   Short Term Goals: Week 1:  OT Short Term Goal 1 (Week 1): Pt will consistently transfer to toilet with 1 caregiver OT Short Term Goal 1 - Progress (Week 1): Met OT Short Term Goal 2 (Week 1): Pt will complete 1/4 steps of donning shirt OT Short Term Goal 2 - Progress (Week 1): Progressing toward goal OT Short Term Goal 3 (Week 1): Pt will complete oral care with MOD A OT Short Term Goal 3 - Progress (Week 1): Progressing toward goal OT Short Term Goal 4 (Week 1): Pt will sit to stand with MOD A or less OT Short Term Goal 4 - Progress (Week 1): Met  Skilled Therapeutic Interventions/Progress Updates:     Pt received in enclosure bed with no pain reported. Mild wincing when moving RUE spontaneously with no following of redirection to broken arm  ADL: repositioned pt in bed scooting up to [facility] as pt was far down laying supine with tube feed running. Attempted to get pt to sit EOB, but politely declined. Also attempted self feeding once repositioned but only ate 1 bite of potatoes d/t not being "ready to eat yet." NT will try feeding later. Pt then provided wash cloth and able ot follow commands to wash arm pits and face. Gown donned total A. Pt left at end of session in enclosure bed secured, call light in reach and all needs met  Session 2: Pt received in bed with no pain reported but initially frustrated not wanting to get up. This clinician and family present encouraged pt to get up and walk.  Therapeutic activity Focus of session on functional mobility for longer distances to improve upright tolerance, endurance, balance, and orientation/conversation during mobility with  increased scissoring while trying to talk. Pt completes 150', 200' and >500' functional mobility with grossly min A but up to MOD A for balance/perturbations/scissoring. BP 150/81 wth thigh high teds.   Pt left at end of session in enclosure bed, call light in reach and all needs met.    Therapy Documentation Precautions:  Precautions Precautions: Fall, Cervical Precaution Booklet Issued: No Precaution Comments: cortrak, bilateral soft mitts Required Braces or Orthoses: Sling, Cervical Brace Cervical Brace: At all times, Hard collar Restrictions Weight Bearing Restrictions: Yes RUE Weight Bearing: Non weight bearing Other Position/Activity Restrictions: in sling, nonoperative mgmt per ortho 9/18 General:      Agitated Behavior Scale: TBI Observation Details Observation Environment: CIR Start of observation period - Date: 02/11/22 Start of observation period - Time: 1120 End of observation period - Date: 02/11/22 End of observation period - Time: 1200 Agitated Behavior Scale (DO NOT LEAVE BLANKS) Short attention span, easy distractibility, inability to concentrate: Present to a moderate degree Impulsive, impatient, low tolerance for pain or frustration: Present to a slight degree Uncooperative, resistant to care, demanding: Absent Violent and/or threatening violence toward people or property: Absent Explosive and/or unpredictable anger: Absent Rocking, rubbing, moaning, or other self-stimulating behavior: Absent Pulling at tubes, restraints, etc.: Absent Wandering from treatment areas: Absent Restlessness, pacing, excessive movement: Present to a moderate degree Repetitive behaviors, motor, and/or verbal: Present to a moderate degree Rapid, loud, or excessive talking: Absent Sudden changes of mood: Absent Easily initiated or excessive  crying and/or laughter: Absent Self-abusiveness, physical and/or verbal: Absent Agitated behavior scale total score:  21    Therapy/Group: Individual Therapy  [name] 02/11/2022, 6:51 AM

## 2022-02-11 NOTE — Progress Notes (Signed)
Physical Therapy Session Note  Patient Details  Name: Dominic Smith MRN: [id number] Date of Birth: 10-21-1940  Today's Date: 02/11/2022 PT Individual Time: [id number] and 802-845 PT Individual Time Calculation (min): 45 min and 43 min  Short Term Goals: Week 3:  PT Short Term Goal 1 (Week 3): Patient will perform bed mobility with min A >50% of the time. PT Short Term Goal 2 (Week 3): Patient will perform basic transfers with min A consistently. PT Short Term Goal 3 (Week 3): Patient will ambulate >300 ft with min A without need for additional assist for LOB. PT Short Term Goal 4 (Week 3): Patient will participate in a formal balance assessment.  Skilled Therapeutic Interventions/Progress Updates: Pt presents supine in enclosed bed and agreeable to therapy.  Donned ace wraps in supine w/ pt following directions to lift feet.  Pt required mod A for sup to sitting at EOB.  L mitt removed for use w/ transfers.  Abd binder donned in sitting w/ frequent cueing for feet flat on floor.  Pt scoots to EOB w/ mod A, pt attempting to use RUE.  Pt transferred sit to stand w/ mod A and then stood for improved balance, LOB posteriorly.  Pt amb w/ L HHA x 4' to TIS and mod A w/ noted unsteadiness. Breakfast brought before him and verbal cues given for use of L hand.  Pt eating and drinking w/ occasional A to break apart eggs, sausage chunks and potatoes.  Pt w/o any choking or coughing.  Pt reaching forward for coffee and siting forward to take mouthfuls.  NT to check percentages eaten.  Pt wheeled to dayroom, pt stating that it is sunny out.   Pt transferred sit to stand w/ min A and amb to window w/ mod A and L HHA.  Pt reaching for arm rest for safe transition stand to sit.  Pt returned to room and amb from doorway to bed w/ same A.  Pt transferred sit to supine w/ mod A.  Pt remained semi-reclined in bed w/ all zippers latched and all needs available.     Therapy Documentation Precautions:   Precautions Precautions: Fall, Cervical Precaution Booklet Issued: No Precaution Comments: cortrak, bilateral soft mitts Required Braces or Orthoses: Sling, Cervical Brace Cervical Brace: At all times, Hard collar Restrictions Weight Bearing Restrictions: Yes RUE Weight Bearing: Non weight bearing Other Position/Activity Restrictions: in sling, nonoperative mgmt per ortho 9/18 General:   Vital Signs:   Pain:no c/o pain.      Therapy/Group: Individual Therapy  [name] 02/11/2022, 9:37 AM

## 2022-02-11 NOTE — Progress Notes (Signed)
Remains in enclosure bed and under tele-monitoring. To evaluate the need for tele this am shift. Restless and agitated at times but easily oriented. Foley catheter in situ and care done. CHG bath completed on my shift. Repositioned for comfort. Safety maintained.

## 2022-02-11 NOTE — Progress Notes (Signed)
PROGRESS NOTE   Subjective/Complaints: Pt ultimately switched to enclosure bed as wrist/leg restraints were ineffective. Seemed to be increasing agitation. Had a good night in enclosure bed. Ate very well again yesterday. Seems alert this morning although still confused. Working with PT when I came in room today. Denied any pain  ROS: Limited due to cognitive/behavioral   Objective:   CT HEAD WO CONTRAST (5MM)  Result Date: 02/09/2022 CLINICAL DATA:  Mental status change EXAM: CT HEAD WITHOUT CONTRAST TECHNIQUE: Contiguous axial images were obtained from the base of the skull through the vertex without intravenous contrast. RADIATION DOSE REDUCTION: This exam was performed according to the departmental dose-optimization program which includes automated exposure control, adjustment of the mA and/or kV according to patient size and/or use of iterative reconstruction technique. COMPARISON:  CT head 01/19/2022 FINDINGS: Brain: Bilateral subdural low-density fluid collections are decreased in size from 01/19/2022 measuring approximately 6 mm on the right and 4 mm on the left. No evidence of acute infarct or new hemorrhage. No hydrocephalus. Generalized cerebral atrophy. Ill-defined hypoattenuation within the cerebral white matter is nonspecific but consistent with chronic small vessel ischemic disease. Vascular: No hyperdense vessel. Intracranial arterial calcification. Skull: No fracture or focal lesion. Sinuses/Orbits: No acute finding. Chronic left maxillary and sphenoid sinusitis. Other: None. IMPRESSION: No acute intracranial abnormality. Decreased bilateral low-density subdural fluid collections compatible with chronic hematomas or hygromas. Electronically Signed   By: Placido Sou M.D.   On: 02/09/2022 23:33   DG Chest 2 View  Result Date: 02/09/2022 CLINICAL DATA:  Follow-up pleural effusion. EXAM: CHEST - 2 VIEW COMPARISON:  Radiograph  01/20/2022. Chest CT 01/10/2022 FINDINGS: Right pleural effusion is at least moderate in size, layering dependently. Pneumothorax on prior CT has resolved. No definite left pleural effusion. The previous left perihilar airspace disease has improved. The heart is normal in size. Aortic atherosclerosis. Subacute right distal clavicle fracture. IMPRESSION: 1. Right pleural effusion, at least moderate in size, layering dependently. 2. Improving left perihilar airspace disease. Electronically Signed   By: Keith Rake M.D.   On: 02/09/2022 19:26   CT CERVICAL SPINE WO CONTRAST  Result Date: 02/09/2022 CLINICAL DATA:  Spine fracture, cervical, traumatic cspine fracture C5/6 EXAM: CT CERVICAL SPINE WITHOUT CONTRAST TECHNIQUE: Multidetector CT imaging of the cervical spine was performed without intravenous contrast. Multiplanar CT image reconstructions were also generated. RADIATION DOSE REDUCTION: This exam was performed according to the departmental dose-optimization program which includes automated exposure control, adjustment of the mA and/or kV according to patient size and/or use of iterative reconstruction technique. COMPARISON:  None Available. FINDINGS: Alignment: Unchanged degenerative anterolisthesis at C4-C5 and trace retrolisthesis at C3-C4. Unchanged degenerative anterolisthesis at T2-T3. Skull base and vertebrae: There has been interval healing with some bony bridging and sclerosis across the C6 vertebral body fracture. There is bony bridging across the previously seen splaying anteriorly. There are persistent minimal residual fracture lines along the periphery. Interval non bridging callus formation along the right C7 transverse process fracture. Interval non bridging callus formation at the anterior right first rib fracture and distal right clavicle fracture, partially visualized. No evidence of new acute fracture. Soft tissues and spinal canal: Nearly resolved hematoma  in the left lower neck and  upper mediastinum. Disc levels: There is multilevel degenerative disc disease and facet arthropathy, unchanged from prior. Upper chest: Pleural effusion extends to the right apex. Focal left apical ground-glass opacities. Other: None. IMPRESSION: Healing C6 vertebral body fracture with some bony bridging and sclerosis. Bony bridging across the previously seen splaying anteriorly. Minimal residual fracture lines along the periphery. Healing right C7 transverse process fracture in unchanged alignment. Partially visualized healing right anterior first rib and distal clavicle fractures. Pleural effusion extends to the right apex. Focal left apical ground-glass opacities, possibly infectious/inflammatory. Recommend chest radiograph. Electronically Signed   By: Maurine Simmering M.D.   On: 02/09/2022 12:19    Recent Labs    02/09/22 0549  WBC 8.5  HGB 9.1*  HCT 28.4*  PLT 343     Recent Labs    02/09/22 0549  NA 143  K 3.9  CL 110  CO2 23  GLUCOSE 105*  BUN 22  CREATININE 1.04  CALCIUM 8.9      Intake/Output Summary (Last 24 hours) at 02/11/2022 0855 Last data filed at 02/11/2022 0827 Gross per 24 hour  Intake 390 ml  Output 300 ml  Net 90 ml      Pressure Injury 01/13/22 Coccyx Mid Unstageable - Full thickness tissue loss in which the base of the injury is covered by slough (yellow, tan, gray, green or brown) and/or eschar (tan, brown or black) in the wound bed. skin is broken with redness at the (Active)  01/13/22 1930  Location: Coccyx  Location Orientation: Mid  Staging: Unstageable - Full thickness tissue loss in which the base of the injury is covered by slough (yellow, tan, gray, green or brown) and/or eschar (tan, brown or black) in the wound bed.  Wound Description (Comments): skin is broken with redness at the base  Present on Admission: Yes (present on admission to rehab 9/27)    Physical Exam: Vital Signs Blood pressure 135/79, pulse 100, temperature 98.7 F (37.1 C),  temperature source Oral, resp. rate 17, height 5\' 11"  (1.803 m), weight 64.4 kg, SpO2 98 %.  Constitutional: No distress . Vital signs reviewed. HEENT: NCAT, EOMI, oral membranes moist, NGT in place Neck: supple Cardiovascular: RRR without murmur. No JVD    Respiratory/Chest: CTA Bilaterally without wheezes or rales. Normal effort    GI/Abdomen: BS +, non-tender, non-distended Ext: no clubbing, cyanosis, or edema Psych: pleasantly confused  Uro: foley in place, urine remains clear Skin: wounds at knees, both hands, left elbow, right ankle.  Sacral wound present, unstageable and dressed. Foam dressings at based of collar  Neuro: alert. Speech clear. Follows commands. Oriented to self only today.   Moves all 4 limbs spontaneously but difficut to grade.   Musculoskeletal: no focal jt pain, nl prom. Right arm in sling    Assessment/Plan: 1. Functional deficits which require 3+ hours per day of interdisciplinary therapy in a comprehensive inpatient rehab setting. Physiatrist is providing close team supervision and 24 hour management of active medical problems listed below. Physiatrist and rehab team continue to assess barriers to discharge/monitor patient progress toward functional and medical goals  Care Tool:  Bathing        Body parts bathed by helper: Right arm, Left arm, Chest, Abdomen, Front perineal area, Buttocks, Right upper leg, Left upper leg, Right lower leg, Left lower leg, Face     Bathing assist Assist Level: 2 Helpers     Upper Body Dressing/Undressing Upper body dressing  What is the patient wearing?: Pull over shirt    Upper body assist Assist Level: Dependent - Patient 0%    Lower Body Dressing/Undressing Lower body dressing            Lower body assist Assist for lower body dressing: 2 Helpers     Toileting Toileting    Toileting assist Assist for toileting: Moderate Assistance - Patient 50 - 74%     Transfers Chair/bed transfer  Transfers  assist     Chair/bed transfer assist level: Moderate Assistance - Patient 50 - 74%     Locomotion Ambulation   Ambulation assist   Ambulation activity did not occur: Safety/medical concerns  Assist level: Moderate Assistance - Patient 50 - 74% Assistive device: Hand held assist Max distance: 180   Walk 10 feet activity   Assist  Walk 10 feet activity did not occur: Safety/medical concerns  Assist level: Moderate Assistance - Patient - 50 - 74% Assistive device: Hand held assist   Walk 50 feet activity   Assist Walk 50 feet with 2 turns activity did not occur: Safety/medical concerns  Assist level: Moderate Assistance - Patient - 50 - 74% Assistive device: Hand held assist    Walk 150 feet activity   Assist Walk 150 feet activity did not occur: Safety/medical concerns  Assist level: Moderate Assistance - Patient - 50 - 74% Assistive device: Hand held assist    Walk 10 feet on uneven surface  activity   Assist Walk 10 feet on uneven surfaces activity did not occur: Safety/medical concerns         Wheelchair     Assist Is the patient using a wheelchair?: Yes Type of Wheelchair: Manual    Wheelchair assist level: Dependent - Patient 0% (TIS)      Wheelchair 50 feet with 2 turns activity    Assist    Wheelchair 50 feet with 2 turns activity did not occur: Safety/medical concerns       Wheelchair 150 feet activity     Assist  Wheelchair 150 feet activity did not occur: Safety/medical concerns       Blood pressure 135/79, pulse 100, temperature 98.7 F (37.1 C), temperature source Oral, resp. rate 17, height 5\' 11"  (1.803 m), weight 64.4 kg, SpO2 98 %.  Medical Problem List and Plan: 1. 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   -minimal/slow progress d/t waxing and waning behavior which is due to premorbid dementia, age, and  TBI    2.  Antithrombotics: -DVT/anticoagulation:  Pharmaceutical: Lovenox 30 mg BID             -antiplatelet therapy: aspirin 81 mg daily 3. Pain Management: continue Tylenol per tube scheduled,\; oxycodone, Robaxin prn 4. Mood/Behavior/Sleep: LCSW to evaluate and provide emotional support             -antipsychotic agents: risperdal             -probably mild baseline dementia (ex-wife confirmed this)  -continue sleep chart   -10/20 did better in enclosure bed --continue   -continue risperdal at lunch and in evening   -minimize any other neurosedating meds   -remove NGT 5. Neuropsych/cognition: This patient is not capable of making decisions on his own behalf.  6. Skin/Wound Care: routine skin care checks             -monitor scalp lac/skin abrasions             -appreciate WOC recs for unstageable sacral/coccyx pressure wound   -continue medihoney/foam dressing 7. Fluids/Electrolytes/Nutrition/dysphagia: Strict Is and Os and follow-up chemistries             -MBS --diet initiated D2/thins  10/12-decided to keep cortrak in for now, decreased HS TF  -pt appears to have eaten a little more this morning  -discussed PO intake with pt. Demonstrated SOME insight  -BMET WNL  -added megace to help stimulate appetite on 10/12  -10/20-ate well again for second consecutive day   -will dc cortrak   -add IVF at HS   -he's not on many medications fortunately    -needs risperdal for behavior--use disintegrating form -check BMET 10/21  8: Right distal clavicle and humerus fracture:  -continue sling; follow-up with Dr. Marcelino Scot on Monday -NWB RUE, pendulum activiites  9: Right 1 st rib fracture: pneumothorax resolved; IS/FV 10: C5-C6, C4 and C7 transverse process fractures:  10/19 appreciate NS f/u re fx;s. 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: continue daily aspirin 13: Urinary retention: has indwelling Foley catheter             -continue cardura    10/8  -Replaced foley cath d/t urinary retention and bleeding/irritation with IC  10/20 continue foley catheter d/t 2 failed voiding trials 15: SAH:  completed 7 days of Keppra; follow-up with neurosurgery 16: Volume overload/peripheral edema: received lasix 40mg  x 1 last week             -strict Is and Os and daily weights              Filed Weights   02/09/22 0404 02/09/22 1446 02/10/22 0500  Weight: 66.7 kg 66.7 kg 64.4 kg    -weights have been inconsistent-continue to monitor 17: AKI: see above 18: Elevated transaminases: trending downward; follow-up CMP tomorrow 19:  ?aspiration pneumonia w/x hx of staph and pseudomonas - vancomycin and cefepime total of 7 more days ended 10/5             -10/16 wbcs 9.3 20: SVT/sinus tach intermittently: monitor 21: Mediastinal hematoma: Echo WNL; Hgb stable 22: Ankle edema and ecchymosis: x-rays negative for osseous abnormality           Continue TEDS, elevation in bed 23. Loose stools improved. Had been constipated.   - 10/20 finally had 2 bm's yesterday without any intervention (refused sorbitol) 24. Normocytic anemia/ABLA: likely related to hospitalization and chronic disease  -no gross blood loss  continue b complex and iron  -10/18 9.1 25. Hypoglycemia:     -he's not diabetic. Dc'ed cbg checks.     LOS: 23 days A FACE TO FACE EVALUATION WAS PERFORMED  Meredith Staggers 02/11/2022, 8:55 AM

## 2022-02-12 LAB — BASIC METABOLIC PANEL
Anion gap: 10 (ref 5–15)
BUN: 32 mg/dL — ABNORMAL HIGH (ref 8–23)
CO2: 21 mmol/L — ABNORMAL LOW (ref 22–32)
Calcium: 8.9 mg/dL (ref 8.9–10.3)
Chloride: 111 mmol/L (ref 98–111)
Creatinine, Ser: 1.05 mg/dL (ref 0.61–1.24)
GFR, Estimated: >60 mL/min (ref >60)
Glucose, Bld: 95 mg/dL (ref 70–99)
Potassium: 3.7 mmol/L (ref 3.5–5.1)
Sodium: 142 mmol/L (ref 135–145)

## 2022-02-12 LAB — GLUCOSE, CAPILLARY
Glucose-Capillary: 100 mg/dL — ABNORMAL HIGH (ref 70–99)
Glucose-Capillary: 105 mg/dL — ABNORMAL HIGH (ref 70–99)
Glucose-Capillary: 87 mg/dL (ref 70–99)
Glucose-Capillary: 88 mg/dL (ref 70–99)

## 2022-02-12 NOTE — Progress Notes (Signed)
PROGRESS NOTE   Subjective/Complaints:  Still using some wrist restraints and enclosure bed.  Pt has minimal social behavior- Hi/bye, how are you, but couldn't do higher level cognition Denies pain  ROS: limited by cognition  Objective:   No results found.  No results for input(s): "WBC", "HGB", "HCT", "PLT" in the last 72 hours.    Recent Labs    02/12/22 0638  NA 142  K 3.7  CL 111  CO2 21*  GLUCOSE 95  BUN 32*  CREATININE 1.05  CALCIUM 8.9      Intake/Output Summary (Last 24 hours) at 02/12/2022 1630 Last data filed at 02/12/2022 1300 Gross per 24 hour  Intake 748.97 ml  Output 1050 ml  Net -301.03 ml      Pressure Injury 01/13/22 Coccyx Mid Unstageable - Full thickness tissue loss in which the base of the injury is covered by slough (yellow, tan, gray, green or brown) and/or eschar (tan, brown or black) in the wound bed. skin is broken with redness at the (Active)  01/13/22 1930  Location: Coccyx  Location Orientation: Mid  Staging: Unstageable - Full thickness tissue loss in which the base of the injury is covered by slough (yellow, tan, gray, green or brown) and/or eschar (tan, brown or black) in the wound bed.  Wound Description (Comments): skin is broken with redness at the base  Present on Admission: Yes (present on admission to rehab 9/27)    Physical Exam: Vital Signs Blood pressure (!) 142/69, pulse 81, temperature 98.7 F (37.1 C), resp. rate 18, height 5\' 11"  (1.803 m), weight 64.4 kg, SpO2 100 %.    General: awake, alert, in enclosure bed;  NAD HENT: conjugate gaze; oropharynx moist CV: regular rate; no JVD Pulmonary: CTA B/L; no W/R/R- good air movement GI: soft, NT, ND, (+)BS Psychiatric: pleasantly confused Neurological: Ox1- some appropriate social behaviors but perseverating severely  Uro: foley in place, urine remains clear Skin: wounds at knees, both hands, left elbow,  right ankle.  Sacral wound present, unstageable and dressed. Foam dressings at based of collar  Neuro: alert. Speech clear. Follows commands. Oriented to self only today.   Moves all 4 limbs spontaneously but difficut to grade.   Musculoskeletal: no focal jt pain, nl prom. Right arm in sling    Assessment/Plan: 1. Functional deficits which require 3+ hours per day of interdisciplinary therapy in a comprehensive inpatient rehab setting. Physiatrist is providing close team supervision and 24 hour management of active medical problems listed below. Physiatrist and rehab team continue to assess barriers to discharge/monitor patient progress toward functional and medical goals  Care Tool:  Bathing        Body parts bathed by helper: Right arm, Left arm, Chest, Abdomen, Front perineal area, Buttocks, Right upper leg, Left upper leg, Right lower leg, Left lower leg, Face     Bathing assist Assist Level: 2 Helpers     Upper Body Dressing/Undressing Upper body dressing   What is the patient wearing?: Pull over shirt    Upper body assist Assist Level: Dependent - Patient 0%    Lower Body Dressing/Undressing Lower body dressing  Lower body assist Assist for lower body dressing: 2 Helpers     Toileting Toileting    Toileting assist Assist for toileting: Moderate Assistance - Patient 50 - 74%     Transfers Chair/bed transfer  Transfers assist     Chair/bed transfer assist level: Moderate Assistance - Patient 50 - 74%     Locomotion Ambulation   Ambulation assist   Ambulation activity did not occur: Safety/medical concerns  Assist level: Moderate Assistance - Patient 50 - 74% Assistive device: Hand held assist Max distance: 180   Walk 10 feet activity   Assist  Walk 10 feet activity did not occur: Safety/medical concerns  Assist level: Moderate Assistance - Patient - 50 - 74% Assistive device: Hand held assist   Walk 50 feet activity   Assist  Walk 50 feet with 2 turns activity did not occur: Safety/medical concerns  Assist level: Moderate Assistance - Patient - 50 - 74% Assistive device: Hand held assist    Walk 150 feet activity   Assist Walk 150 feet activity did not occur: Safety/medical concerns  Assist level: Moderate Assistance - Patient - 50 - 74% Assistive device: Hand held assist    Walk 10 feet on uneven surface  activity   Assist Walk 10 feet on uneven surfaces activity did not occur: Safety/medical concerns         Wheelchair     Assist Is the patient using a wheelchair?: Yes Type of Wheelchair: Manual    Wheelchair assist level: Dependent - Patient 0% (TIS)      Wheelchair 50 feet with 2 turns activity    Assist    Wheelchair 50 feet with 2 turns activity did not occur: Safety/medical concerns       Wheelchair 150 feet activity     Assist  Wheelchair 150 feet activity did not occur: Safety/medical concerns       Blood pressure (!) 142/69, pulse 81, temperature 98.7 F (37.1 C), resp. rate 18, height 5\' 11"  (1.803 m), weight 64.4 kg, SpO2 100 %.  Medical Problem List and Plan: 1. 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   -minimal/slow progress d/t waxing and waning behavior which is due to premorbid dementia, age, and TBI   Con't CIR- PT, OT and SLP- looking at SNF 2.  Antithrombotics: -DVT/anticoagulation:  Pharmaceutical: Lovenox 30 mg BID             -antiplatelet therapy: aspirin 81 mg daily 3. Pain Management: continue Tylenol per tube scheduled,\; oxycodone, Robaxin prn 4. Mood/Behavior/Sleep: LCSW to evaluate and provide emotional support             -antipsychotic agents: risperdal             -probably mild baseline dementia (ex-wife confirmed this)  -continue sleep chart   -10/20 did better in enclosure bed --continue   -continue risperdal at lunch and in  evening   -minimize any other neurosedating meds   -remove NGT  10/21- still needs enclosure bed- confused and intermittently agitated- per primary physician/TBI physician 5. Neuropsych/cognition: This patient is not capable of making decisions on his own behalf.               6. Skin/Wound Care: routine skin care checks             -  monitor scalp lac/skin abrasions             -appreciate WOC recs for unstageable sacral/coccyx pressure wound   -continue medihoney/foam dressing 7. Fluids/Electrolytes/Nutrition/dysphagia: Strict Is and Os and follow-up chemistries             -MBS --diet initiated D2/thins  10/12-decided to keep cortrak in for now, decreased HS TF  -pt appears to have eaten a little more this morning  -discussed PO intake with pt. Demonstrated SOME insight  -BMET WNL  -added megace to help stimulate appetite on 10/12  -10/20-ate well again for second consecutive day   -will dc cortrak   -add IVF at HS   -he's not on many medications fortunately    -needs risperdal for behavior--use disintegrating form -check BMET 10/21  8: Right distal clavicle and humerus fracture:  -continue sling; follow-up with Dr. Marcelino Scot on Monday -NWB RUE, pendulum activiites  9: Right 1 st rib fracture: pneumothorax resolved; IS/FV 10: C5-C6, C4 and C7 transverse process fractures:  10/19 appreciate NS f/u re fx;s. 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: continue daily aspirin 13: Urinary retention: has indwelling Foley catheter             -continue cardura    10/8 -Replaced foley cath d/t urinary retention and bleeding/irritation with IC  10/20 continue foley catheter d/t 2 failed voiding trials 15: SAH:  completed 7 days of Keppra; follow-up with neurosurgery 16: Volume overload/peripheral edema: received lasix 40mg  x 1 last week             -strict Is and Os and daily weights              Filed Weights   02/09/22 0404 02/09/22 1446 02/10/22 0500  Weight: 66.7 kg 66.7 kg  64.4 kg    -weights have been inconsistent-continue to monitor 17: AKI: see above 18: Elevated transaminases: trending downward; follow-up CMP tomorrow 19:  ?aspiration pneumonia w/x hx of staph and pseudomonas - vancomycin and cefepime total of 7 more days ended 10/5             -10/16 wbcs 9.3 20: SVT/sinus tach intermittently: monitor 21: Mediastinal hematoma: Echo WNL; Hgb stable 22: Ankle edema and ecchymosis: x-rays negative for osseous abnormality           Continue TEDS, elevation in bed 23. Loose stools improved. Had been constipated.   - 10/20 finally had 2 bm's yesterday without any intervention (refused sorbitol) 24. Normocytic anemia/ABLA: likely related to hospitalization and chronic disease  -no gross blood loss  continue b complex and iron  -10/18 9.1 25. Hypoglycemia:     -he's not diabetic. Dc'ed cbg checks.     LOS: 24 days A FACE TO FACE EVALUATION WAS PERFORMED  Millan Legan 02/12/2022, 4:30 PM

## 2022-02-12 NOTE — Progress Notes (Signed)
0000 CBG 88. Pt given 2 spoons of pudding and grape juice. Next CBG at 4 am. PT is calm and resting without distress or agitation.

## 2022-02-12 NOTE — Progress Notes (Signed)
Occupational Therapy TBI Note  Patient Details  Name: Dominic Smith MRN: [id number] Date of Birth: 29-May-1940  Today's Date: 02/12/2022 OT Individual Time: 0700-0800 OT Individual Time Calculation (min): 60 min    Short Term Goals: Week 1:  OT Short Term Goal 1 (Week 1): Pt will consistently transfer to toilet with 1 caregiver OT Short Term Goal 1 - Progress (Week 1): Met OT Short Term Goal 2 (Week 1): Pt will complete 1/4 steps of donning shirt OT Short Term Goal 2 - Progress (Week 1): Progressing toward goal OT Short Term Goal 3 (Week 1): Pt will complete oral care with MOD A OT Short Term Goal 3 - Progress (Week 1): Progressing toward goal OT Short Term Goal 4 (Week 1): Pt will sit to stand with MOD A or less OT Short Term Goal 4 - Progress (Week 1): Met  Skilled Therapeutic Interventions/Progress Updates:     Pt received in bed with no pain. Pt in good mood. Pt oriented to self and hospital, but funcitonally still confused [place] the farm. ADL: Pt given 2 tasks to complete this session: put on pants and eat breakfast. Pt able to immediately recall, however after donning pants with MAX A for full reach as pt does not understand should not use RUE for large functional reach d/t fx. Pt taken out to dayroom at high low table and set up with plate, silverware and cup of juice. Pt self feeds with RUE--refuses LUE despite Education--RUE remains in sling with elbow supported on armrest of TIS. Overall pt does well with self feeding needing A 25% of meal for managing bite size. Pt able ot scan tabletop independently and locate cup and napkin. Pt enjoying looking out window and commenting on sunrise. No overt s/s of aspiration with SLP instructions followed. Pt states the eggs, sausage and potatoes, "taste like oatmeal" and "it's good" Pt left at end of session in enclosure bed with Mitts on, call light in reach and all needs met   Therapy Documentation Precautions:   Precautions Precautions: Fall, Cervical Precaution Booklet Issued: No Precaution Comments: cortrak, bilateral soft mitts Required Braces or Orthoses: Sling, Cervical Brace Cervical Brace: At all times, Hard collar Restrictions Weight Bearing Restrictions: Yes RUE Weight Bearing: Non weight bearing Other Position/Activity Restrictions: in sling, nonoperative mgmt per ortho 9/18 General:    Agitated Behavior Scale: TBI  Observation Details Observation Environment: CIR Start of observation period - Date: 02/12/22 Start of observation period - Time: 0700 End of observation period - Date: 02/12/22 End of observation period - Time: 0800 Agitated Behavior Scale (DO NOT LEAVE BLANKS) Short attention span, easy distractibility, inability to concentrate: Present to a moderate degree Impulsive, impatient, low tolerance for pain or frustration: Absent Uncooperative, resistant to care, demanding: Absent Violent and/or threatening violence toward people or property: Absent Explosive and/or unpredictable anger: Absent Rocking, rubbing, moaning, or other self-stimulating behavior: Absent Pulling at tubes, restraints, etc.: Present to a moderate degree Wandering from treatment areas: Absent Restlessness, pacing, excessive movement: Absent Repetitive behaviors, motor, and/or verbal: Absent Rapid, loud, or excessive talking: Absent Sudden changes of mood: Absent Easily initiated or excessive crying and/or laughter: Absent Self-abusiveness, physical and/or verbal: Absent Agitated behavior scale total score: 18    Therapy/Group: Individual Therapy  [name] 02/12/2022, 8:01 AM

## 2022-02-12 NOTE — Progress Notes (Signed)
Physical Therapy Session Note  Patient Details  Name: Dominic Smith MRN: [id number] Date of Birth: 1940-12-16  Today's Date: 02/12/2022 PT Individual Time: 1300-1348 PT Individual Time Calculation (min): 48 min   Short Term Goals: Week 3:  PT Short Term Goal 1 (Week 3): Patient will perform bed mobility with min A >50% of the time. PT Short Term Goal 2 (Week 3): Patient will perform basic transfers with min A consistently. PT Short Term Goal 3 (Week 3): Patient will ambulate >300 ft with min A without need for additional assist for LOB. PT Short Term Goal 4 (Week 3): Patient will participate in a formal balance assessment.  Skilled Therapeutic Interventions/Progress Updates:   First session:  Pt presents supine in enclosed bed.  Pt appears pleasant but unwilling to participate w/ therapy at this time.  PT re-directed to place and need for continued therapy, but unsuccessful.  Will attempt as able to return to make up for missed time.  Missed time 45 min.  Second session:Pt presents sitting in TIS and more alert this PM, pt agreeable to therapy.  Pt very fidgety w/ B hands, requiring multiple re-positioning of R sling.  Pt rubbing hands together.  Mitts removed for time during therapy session.  Pt transferred multiple trials sit to stand w/ mod A to min A and verbal cues for NWB to RUE.  Pt amb multiple trials x 180' w/ L HHA and min to mod A.  Pt following more directions this PM, showing awareness of proper directions when asked but then unable to carry through , ie turn R at next hallway, points to the right but at next hallway will turn to left.  Pt amb w/o LOB w/ cueing for visual scanning to approach TIS w/c.  Pt returned to room and able to feed self x 4 bites w/ spoon in L hand, pt attempts to use R hand w/ mitt for napking.  Pt handed off to NT for feeding.       Therapy Documentation Precautions:  Precautions Precautions: Fall, Cervical Precaution Booklet Issued: No Precaution  Comments: cortrak, bilateral soft mitts Required Braces or Orthoses: Sling, Cervical Brace Cervical Brace: At all times, Hard collar Restrictions Weight Bearing Restrictions: Yes RUE Weight Bearing: Non weight bearing Other Position/Activity Restrictions: in sling, nonoperative mgmt per ortho 9/18 General: PT Amount of Missed Time (min): 45 Minutes PT Missed Treatment Reason: Patient fatigue;Patient unwilling to participate Vital Signs:  Pain:no c/o pain.       Therapy/Group: Individual Therapy  [name] 02/12/2022, 1:59 PM

## 2022-02-13 NOTE — Progress Notes (Signed)
PROGRESS NOTE   Subjective/Complaints:  Pt says doesn't feel good, "because they took my food away"-  Based on what seen, pt wasn't able to feed self, since was leaning to L, and required LUE to prop- so asked NT's to get him into w/c so he can feed self- they were in this process when he got upset.   Denies pain LBM yesterday per NT's- pt unable to answer.    ROS: limited by cognition  Objective:   No results found.  No results for input(s): "WBC", "HGB", "HCT", "PLT" in the last 72 hours.    Recent Labs    02/12/22 0638  NA 142  K 3.7  CL 111  CO2 21*  GLUCOSE 95  BUN 32*  CREATININE 1.05  CALCIUM 8.9      Intake/Output Summary (Last 24 hours) at 02/13/2022 1302 Last data filed at 02/13/2022 0900 Gross per 24 hour  Intake 240 ml  Output 750 ml  Net -510 ml      Pressure Injury 01/13/22 Coccyx Mid Unstageable - Full thickness tissue loss in which the base of the injury is covered by slough (yellow, tan, gray, green or brown) and/or eschar (tan, brown or black) in the wound bed. skin is broken with redness at the (Active)  01/13/22 1930  Location: Coccyx  Location Orientation: Mid  Staging: Unstageable - Full thickness tissue loss in which the base of the injury is covered by slough (yellow, tan, gray, green or brown) and/or eschar (tan, brown or black) in the wound bed.  Wound Description (Comments): skin is broken with redness at the base  Present on Admission: Yes (present on admission to rehab 9/27)    Physical Exam: Vital Signs Blood pressure (!) 148/89, pulse 85, temperature 98 F (36.7 C), temperature source Oral, resp. rate 18, height 5\' 11"  (1.803 m), weight 64.4 kg, SpO2 100 %.     General: awake, alert, inappropriate- sitting on EOB in enclosure bed- unhappy since had to stop feeding him to get him into w/c; NT's at bedside transferring him; NAD HENT: conjugate gaze; oropharynx  moist CV: regular rate; no JVD Pulmonary: CTA B/L; no W/R/R- good air movement GI: soft, NT, ND, (+)BS Psychiatric: calm, but pouting about food/not being fed by staff Neurological: Ox1 Uro: foley in place, urine remains clear Skin: wounds at knees, both hands, left elbow, right ankle.  Sacral wound present, unstageable and dressed. Foam dressings at based of collar  Neuro: alert. Speech clear. Follows commands. Oriented to self only today.   Moves all 4 limbs spontaneously but difficut to grade.   Musculoskeletal: no focal jt pain, nl prom. Right arm in sling    Assessment/Plan: 1. Functional deficits which require 3+ hours per day of interdisciplinary therapy in a comprehensive inpatient rehab setting. Physiatrist is providing close team supervision and 24 hour management of active medical problems listed below. Physiatrist and rehab team continue to assess barriers to discharge/monitor patient progress toward functional and medical goals  Care Tool:  Bathing        Body parts bathed by helper: Right arm, Left arm, Chest, Abdomen, Front perineal area, Buttocks, Right upper leg, Left upper leg,  Right lower leg, Left lower leg, Face     Bathing assist Assist Level: 2 Helpers     Upper Body Dressing/Undressing Upper body dressing   What is the patient wearing?: Pull over shirt    Upper body assist Assist Level: Dependent - Patient 0%    Lower Body Dressing/Undressing Lower body dressing            Lower body assist Assist for lower body dressing: 2 Helpers     Toileting Toileting    Toileting assist Assist for toileting: Moderate Assistance - Patient 50 - 74%     Transfers Chair/bed transfer  Transfers assist     Chair/bed transfer assist level: Moderate Assistance - Patient 50 - 74%     Locomotion Ambulation   Ambulation assist   Ambulation activity did not occur: Safety/medical concerns  Assist level: Moderate Assistance - Patient 50 -  74% Assistive device: Hand held assist Max distance: 180   Walk 10 feet activity   Assist  Walk 10 feet activity did not occur: Safety/medical concerns  Assist level: Moderate Assistance - Patient - 50 - 74% Assistive device: Hand held assist   Walk 50 feet activity   Assist Walk 50 feet with 2 turns activity did not occur: Safety/medical concerns  Assist level: Moderate Assistance - Patient - 50 - 74% Assistive device: Hand held assist    Walk 150 feet activity   Assist Walk 150 feet activity did not occur: Safety/medical concerns  Assist level: Moderate Assistance - Patient - 50 - 74% Assistive device: Hand held assist    Walk 10 feet on uneven surface  activity   Assist Walk 10 feet on uneven surfaces activity did not occur: Safety/medical concerns         Wheelchair     Assist Is the patient using a wheelchair?: Yes Type of Wheelchair: Manual    Wheelchair assist level: Dependent - Patient 0% (TIS)      Wheelchair 50 feet with 2 turns activity    Assist    Wheelchair 50 feet with 2 turns activity did not occur: Safety/medical concerns       Wheelchair 150 feet activity     Assist  Wheelchair 150 feet activity did not occur: Safety/medical concerns       Blood pressure (!) 148/89, pulse 85, temperature 98 F (36.7 C), temperature source Oral, resp. rate 18, height 5\' 11"  (1.803 m), weight 64.4 kg, SpO2 100 %.  Medical Problem List and Plan: 1. 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   -minimal/slow progress d/t waxing and waning behavior which is due to premorbid dementia, age, and TBI  Con't CIR- PT, OT and SLP- looking at SNF 2.  Antithrombotics: -DVT/anticoagulation:  Pharmaceutical: Lovenox 30 mg BID             -antiplatelet therapy: aspirin 81 mg daily 3. Pain Management: continue Tylenol per tube scheduled,\; oxycodone,  Robaxin prn  10/22- denies pain- con't regimen 4. Mood/Behavior/Sleep: LCSW to evaluate and provide emotional support             -antipsychotic agents: risperdal             -probably mild baseline dementia (ex-wife confirmed this)  -continue sleep chart   -10/20 did better in enclosure bed --continue   -  continue risperdal at lunch and in evening   -minimize any other neurosedating meds   -remove NGT  10/21-10/22-- still needs enclosure bed- confused and intermittently agitated- per primary physician/TBI physician 5. Neuropsych/cognition: This patient is not capable of making decisions on his own behalf.               6. Skin/Wound Care: routine skin care checks             -monitor scalp lac/skin abrasions             -appreciate WOC recs for unstageable sacral/coccyx pressure wound   -continue medihoney/foam dressing 7. Fluids/Electrolytes/Nutrition/dysphagia: Strict Is and Os and follow-up chemistries             -MBS --diet initiated D2/thins  10/12-decided to keep cortrak in for now, decreased HS TF  -pt appears to have eaten a little more this morning  -discussed PO intake with pt. Demonstrated SOME insight  -BMET WNL  -added megace to help stimulate appetite on 10/12  -10/20-ate well again for second consecutive day   -will dc cortrak   -add IVF at HS   -he's not on many medications fortunately    -needs risperdal for behavior--use disintegrating form -check BMET 10/21  10/22- BUN up to 32- will increase night time IVFs to 75 cc /hour for 12 hours/night 8: Right distal clavicle and humerus fracture:  -continue sling; follow-up with Dr. Marcelino Scot on Monday -NWB RUE, pendulum activiites  10/22- wearing sling RUE 9: Right 1 st rib fracture: pneumothorax resolved; IS/FV 10: C5-C6, C4 and C7 transverse process fractures:  10/19 appreciate NS f/u re fx;s. 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: continue daily aspirin 13: Urinary retention: has indwelling Foley  catheter             -continue cardura    10/8 -Replaced foley cath d/t urinary retention and bleeding/irritation with IC  10/20 continue foley catheter d/t 2 failed voiding trials 15: SAH:  completed 7 days of Keppra; follow-up with neurosurgery 16: Volume overload/peripheral edema: received lasix 40mg  x 1 last week             -strict Is and Os and daily weights              Filed Weights   02/09/22 0404 02/09/22 1446 02/10/22 0500  Weight: 66.7 kg 66.7 kg 64.4 kg    -weights have been inconsistent-continue to monitor  10/22- will d/w nursing to get weights done- last one in computer 10/19 17: AKI: see above 18: Elevated transaminases: trending downward; follow-up CMP tomorrow 19:  ?aspiration pneumonia w/x hx of staph and pseudomonas - vancomycin and cefepime total of 7 more days ended 10/5             -10/16 wbcs 9.3 20: SVT/sinus tach intermittently: monitor 21: Mediastinal hematoma: Echo WNL; Hgb stable 22: Ankle edema and ecchymosis: x-rays negative for osseous abnormality           Continue TEDS, elevation in bed 23. Loose stools improved. Had been constipated.   - 10/20 finally had 2 bm's yesterday without any intervention (refused sorbitol) 24. Normocytic anemia/ABLA: likely related to hospitalization and chronic disease  -no gross blood loss  continue b complex and iron  -10/18 9.1 25. Hypoglycemia:     -he's not diabetic. Dc'ed cbg checks.     LOS: 25 days A FACE TO FACE EVALUATION WAS PERFORMED  Dominic Smith 02/13/2022, 1:02 PM

## 2022-02-13 NOTE — Progress Notes (Signed)
Occupational Therapy Session Note  Patient Details  Name: Alan Drummer MRN: [id number] Date of Birth: April 06, 1941  Today's Date: 02/13/2022 OT Individual Time: 1300-1330 OT Individual Time Calculation (min): 30 min    Short Term Goals: Week 4:  OT Short Term Goal 1 (Week 4): Pt will complete toilet transfer with MIN A consistently OT Short Term Goal 2 (Week 4): Pt will complete 1/3 steps of LB dressing OT Short Term Goal 3 (Week 4): Pt will be oriented to place 25% of encounters  Skilled Therapeutic Interventions/Progress Updates:    Upon OT arrival, pt seated in TIS. Pt able to state is name only. Pt was oriented to place and situation. Pt repetitive during session stating "You made me do it" and "And what does this prove?". Pt was provided education consistently throughout session with minimal comprehension and decreased memory. Treatment intervention with a focus on command following, use of the L UE, attention to task. Pt was educated on NWB precautions of R UE and sling was adjusted. While seated in TIS, pt places graded clips onto horizontal rods with the L UE. Pt requires max verbal cuing and manual assist for initiation but is able to place total of 5 clips with increased time. Pt occasionally undershoots placement of clips. Pt then engages in ball toss across tabletop pushing beach ball from one side of table to the other back and forth with therapist. Pt does better with cuing "push it" demonstrating a good response and is able to complete 10 times. Pt with poor eye contact during session and no agitation just confusion. Pt was left in TIS at end of session with all needs met and safety measures in place.   Therapy Documentation Precautions:  Precautions Precautions: Fall, Cervical Precaution Booklet Issued: No Precaution Comments: cortrak, bilateral soft mitts Required Braces or Orthoses: Sling, Cervical Brace Cervical Brace: At all times, Hard collar Restrictions Weight  Bearing Restrictions: Yes RUE Weight Bearing: Non weight bearing Other Position/Activity Restrictions: in sling, nonoperative mgmt per ortho 9/18     Therapy/Group: Individual Therapy  Marvetta Gibbons 02/13/2022, 1:35 PM

## 2022-02-13 NOTE — Progress Notes (Signed)
Physical Therapy Session Note  Patient Details  Name: Dominic Smith MRN: [id number] Date of Birth: 02/07/1941  Today's Date: 02/13/2022 PT Individual Time: 4158-3094 and  0768-0881 PT Individual Time Calculation (min): 45 min and 35 min   Short Term Goals: Week 3:  PT Short Term Goal 1 (Week 3): Patient will perform bed mobility with min A >50% of the time. PT Short Term Goal 1 - Progress (Week 3): Not met PT Short Term Goal 2 (Week 3): Patient will perform basic transfers with min A consistently. PT Short Term Goal 2 - Progress (Week 3): Not met PT Short Term Goal 3 (Week 3): Patient will ambulate >300 ft with min A without need for additional assist for LOB. PT Short Term Goal 3 - Progress (Week 3): Not met PT Short Term Goal 4 (Week 3): Patient will participate in a formal balance assessment. PT Short Term Goal 4 - Progress (Week 3): Not met Week 4:  PT Short Term Goal 1 (Week 4): Patient will perform bed mobility with min A consistently. PT Short Term Goal 2 (Week 4): Patient will perform basic transfers with min A >75% of the time. PT Short Term Goal 3 (Week 4): Patient will ambulate >200 feet with min A using LRAD without additional assist for LOB. PT Short Term Goal 4 (Week 4): Patient will perform dynamic standing balance >2 min with min A.  Skilled Therapeutic Interventions/Progress Updates:     Session 1: Patient in open enclosure bed with 2 NT's in the room performing peri-care upon PT arrival. Patient alert and agreeable to PT session. Patient denied pain during session.  Patient disoriented to time, place, and situation, only gave his first name when ask self orientation questions. Patient with poor initiation and awareness this session as well, leading to increased assist with mobility.   Patient with thick slough and serous drainage from skin tear to R arm, foam dressing coming off. Cleaned wound with soap and water and reapplied foam dressing with significant  improvement in appearance during session, RN made aware.   Donned B thigh high TED hose with total A prior to mobility.  Therapeutic Activity: Bed Mobility: Patient performed supine to sit with mod-max A with poor initiation and ability to follow cues despite increased time and simple one-step commands.  Transfers: Patient performed stand pivot bed>w/c with max A and poor motor planning with L foot bending back and not on the ground during transfer until able to progress patient to weight shifting for turn. Provided verbal cues for initiation and sequencing with increased time and multimodal cuing.  Patient in [place] w/c with soft waist restraint secured at end of session with breaks locked, Telesitter in place, and all needs within reach. Educated NT that patient has been successful with eating yogurt, berry magic cup, and chocolate ensure, as nursing reported patient only ate a few bites of his breakfast.   ABS: 16   Session 2: Patient lying in the enclosure bed opening the L side of the bed with clips not secure upon PT arrival, RN made aware. Patient alert and agreeable to PT session. Patient denied pain during session, reports "I feel great!"  Patient partially oriented to place and situation, oriented to self, and disoriented to time this afternoon. Provided reinforcement of orientation during session. Patient with increased verbalization, arousal, and improved initiation and awareness this afternoon.  B thigh high TED hose still donned prior to and throughout session.   Therapeutic Activity: Bed Mobility: Patient performed  supine to/from sit with min-mod A from a flat bed. Provided verbal cues for bringing his legs off the bed and pulling up using his L hand. Provided cues for NWB on R upper extremity with poor compliance. Adjusted R arm sling in sitting for improved support and reduced use of R hand.  Transfers: Patient performed sit to/from stand x2 with min A using L HHA. Provided verbal  cues for initiation and facilitation for forward weight shift x1 each trial.  Gait Training:  Patient ambulated >200 feet x2 with a sitting rest break between trials using L HHA with min A and x4 LOB due to R scissoring gait requiring mod-max A to correct. Ambulated with decreased gait speed, decreased step length and height, narrow BOS with intermittent scissoring on R, forward trunk lean, and downward head gaze. Provided verbal cues for erect posture, looking ahead at visual targets, increased BOS, and increased L weight shift to improved R foot placement.  While ambulating on second trial, used standing scale to weight patient during session. Patient stepped up on scale with min A using grab bar. Patient weighed 129.4 lbs on standing scale during session. RN made aware and documented patient's weight in flowsheet.   Patient in secured enclosure bed at end of session with breaks locked, Telesitter in the room, and all needs within reach.   ABS: 15  Therapy Documentation Precautions:  Precautions Precautions: Fall, Cervical Precaution Booklet Issued: No Precaution Comments: cortrak, bilateral soft mitts Required Braces or Orthoses: Sling, Cervical Brace Cervical Brace: At all times, Hard collar Restrictions Weight Bearing Restrictions: Yes RUE Weight Bearing: Non weight bearing Other Position/Activity Restrictions: in sling, nonoperative mgmt per ortho 9/18    Therapy/Group: Individual Therapy  Eve Rey L Khloe Hunkele PT, DPT, NCS, CBIS  02/13/2022, 6:16 PM

## 2022-02-13 NOTE — Progress Notes (Signed)
Speech Language Pathology TBI Note  Patient Details  Name: Dominic Smith MRN: [id number] Date of Birth: Mar 23, 1941  Today's Date: 02/13/2022 SLP Individual Time: 1030-1045 SLP Individual Time Calculation (min): 15 min  Short Term Goals: Week 4: SLP Short Term Goal 1 (Week 4): Patient will consume current diet with minimal overt s/s of aspiration and Min verbal cues for use of swallowing compensatory strategies. SLP Short Term Goal 2 (Week 4): Patient will orient to place and month with Mod A multimodal cues. SLP Short Term Goal 3 (Week 4): Patient will follow commands throughout functional tasks in 75% of opportunities with Max multimodal cues. SLP Short Term Goal 4 (Week 4): Patient will demonstate sustained attenton to functional tasks for 5 minutes with Max A multimodal cues for redirection.  Skilled Therapeutic Interventions:   Pt was seen for skilled ST targeting cognitive goals.  Pt was seated in tilt in space wheelchair with eyes closed upon therapist's arrival.  Pt remained soundly asleep despite ~15 minutes of SLP's attempts to awaken including sternal rub, cold compress, oral care, and small amounts of cold water applied to lips via straw siphon to entice pt to awaken for PO intake.  Pt would intermittently vocalize during therapist's attempts to awaken; however, his vocalizations were completely incomprehensible.  As a result, pt missed 30 min of scheduled treatment session.    Pain Pain Assessment Pain Scale: 0-10 Pain Score: 0-No pain  Agitated Behavior Scale: TBI Observation Details Observation Environment: CIR Start of observation period - Date: 02/13/22 Start of observation period - Time: 1030 End of observation period - Date: 02/13/22 End of observation period - Time: 1050 Agitated Behavior Scale (DO NOT LEAVE BLANKS) Short attention span, easy distractibility, inability to concentrate: Present to a moderate degree Impulsive, impatient, low tolerance for pain or  frustration: Absent Uncooperative, resistant to care, demanding: Absent Violent and/or threatening violence toward people or property: Absent Explosive and/or unpredictable anger: Absent Rocking, rubbing, moaning, or other self-stimulating behavior: Absent Pulling at tubes, restraints, etc.: Absent Wandering from treatment areas: Absent Restlessness, pacing, excessive movement: Absent Repetitive behaviors, motor, and/or verbal: Absent Rapid, loud, or excessive talking: Absent Sudden changes of mood: Absent Easily initiated or excessive crying and/or laughter: Absent Self-abusiveness, physical and/or verbal: Absent Agitated behavior scale total score: 16  Therapy/Group: Individual Therapy  Phynix Horton, Selinda Orion 02/13/2022, 10:50 AM

## 2022-02-14 DIAGNOSIS — K5900 Constipation, unspecified: Secondary | ICD-10-CM

## 2022-02-14 DIAGNOSIS — R451 Restlessness and agitation: Secondary | ICD-10-CM

## 2022-02-14 DIAGNOSIS — R7401 Elevation of levels of liver transaminase levels: Secondary | ICD-10-CM

## 2022-02-14 MED ORDER — BOOST / RESOURCE BREEZE PO LIQD CUSTOM
1.0000 | Freq: Three times a day (TID) | ORAL | Status: DC
  Administered 2022-02-14 – 2022-02-21 (×14): 1 via ORAL

## 2022-02-14 NOTE — Progress Notes (Signed)
Speech Language Pathology TBI Note  Patient Details  Name: Dominic Smith MRN: [id number] Date of Birth: 1940/06/29  Today's Date: 02/14/2022 SLP Individual Time: 0867-6195 SLP Individual Time Calculation (min): 40 min  Short Term Goals: Week 4: SLP Short Term Goal 1 (Week 4): Patient will consume current diet with minimal overt s/s of aspiration and Min verbal cues for use of swallowing compensatory strategies. SLP Short Term Goal 2 (Week 4): Patient will orient to place and month with Mod A multimodal cues. SLP Short Term Goal 3 (Week 4): Patient will follow commands throughout functional tasks in 75% of opportunities with Max multimodal cues. SLP Short Term Goal 4 (Week 4): Patient will demonstate sustained attenton to functional tasks for 5 minutes with Max A multimodal cues for redirection.  Skilled Therapeutic Interventions: Skilled treatment session focused on cognitive and dysphagia goals. Upon arrival, patient was awake in bed but required total A for orientation place, time and situation despite Max verbal and visual cues. Patient sat EOB with Max A multimodal cues. Patient consumed a snack of Dys. 1 textures with thin liquids via straw without overt s/s of aspiration with Max verbal cues needed for initiation throughout task. Patient required Max verbal cues for sustained attention to task for ~20 minutes. Patient left supine in enclosure bed with all needs within reach. Continue with current plan of care.      Pain No/Denies Pain   Agitated Behavior Scale: TBI Observation Details Observation Environment: [facility] Start of observation period - Date: 02/14/22 Start of observation period - Time: 73 End of observation period - Date: 02/14/22 End of observation period - Time: 1100 Agitated Behavior Scale (DO NOT LEAVE BLANKS) Short attention span, easy distractibility, inability to concentrate: Present to a moderate degree Impulsive, impatient, low tolerance for pain or  frustration: Absent Uncooperative, resistant to care, demanding: Absent Violent and/or threatening violence toward people or property: Absent Explosive and/or unpredictable anger: Absent Rocking, rubbing, moaning, or other self-stimulating behavior: Absent Pulling at tubes, restraints, etc.: Absent Wandering from treatment areas: Absent Restlessness, pacing, excessive movement: Absent Repetitive behaviors, motor, and/or verbal: Absent Rapid, loud, or excessive talking: Absent Sudden changes of mood: Absent Easily initiated or excessive crying and/or laughter: Absent Self-abusiveness, physical and/or verbal: Absent Agitated behavior scale total score: 16  Therapy/Group: Individual Therapy  Ishaaq Penna 02/14/2022, 3:35 PM

## 2022-02-14 NOTE — Progress Notes (Signed)
Physical Therapy Session Note  Patient Details  Name: Dominic Smith MRN: [id number] Date of Birth: 1941-02-29  Today's Date: 02/14/2022 PT Individual Time: 0910-1005 PT Individual Time Calculation (min): 55 min   Short Term Goals: Week 4:  PT Short Term Goal 1 (Week 4): Patient will perform bed mobility with min A consistently. PT Short Term Goal 2 (Week 4): Patient will perform basic transfers with min A >75% of the time. PT Short Term Goal 3 (Week 4): Patient will ambulate >200 feet with min A using LRAD without additional assist for LOB. PT Short Term Goal 4 (Week 4): Patient will perform dynamic standing balance >2 min with min A.  Skilled Therapeutic Interventions/Progress Updates:     Patient in secure enclosure bed on the phone with his son upon PT arrival. Patient alert and agreeable to PT session. Patient denied pain during session.  Patient and son engaged in spiritual activity over the phone. Provided time for patient to participate un-interrupted. Patient's son hung up. Patient perseverative on "We need to focus on the word of God." PT acknowledged and supportive of his spiritual beliefs, however, even with significant time patient unable to redirect to address mobility and hygiene this morning. Patient was incontinent of bowel prior to session, patient not internally or externally motivated to go to the bathroom to perform toileting/peri-care despite sitting patient EOB with total A with x2 attempts to stand with patient not initiating stand and agreeing to return to lying with max A. Performed rolling R/L with total A for peri-care and brief change. Sacral wound without dressing this morning, RN made aware and to address as soon as able at end of session, as supplies were not available in to room to apply dressing. MD rounded during session, informed MD that patient only oriented to his first name this morning with general decline in cognition and functional mobility the past 2  mornings with some improvement yesterday afternoon.   Patient required increased time for initiation, cuing, rest breaks, and for completion of tasks throughout session. Utilized therapeutic use of self throughout to promote efficiency.   Patient in secure enclosure bed with Telesitter in the room at end of session with breaks locked and all needs within reach.   ABS: 21  Therapy Documentation Precautions:  Precautions Precautions: Fall, Cervical Precaution Booklet Issued: No Precaution Comments: cortrak, bilateral soft mitts Required Braces or Orthoses: Sling, Cervical Brace Cervical Brace: At all times, Hard collar Restrictions Weight Bearing Restrictions: Yes RUE Weight Bearing: Non weight bearing Other Position/Activity Restrictions: in sling, nonoperative mgmt per ortho 9/18 General: PT Amount of Missed Time (min): 20 Minutes PT Missed Treatment Reason: Patient unwilling to participate;Other (Comment) (limited by cognitive deficits)    Therapy/Group: Individual Therapy  Shanin Szymanowski L Keisean Skowron PT, DPT, NCS, CBIS  02/14/2022, 10:14 AM

## 2022-02-14 NOTE — Progress Notes (Signed)
PROGRESS NOTE   Subjective/Complaints:  Working with therapy in enclosure bed this AM.  He denies pain. No reports of poor sleep noted.  Noted to have more difficulty with therapy this AM and past several days.    ROS: limited by cognition  Objective:   No results found.  No results for input(s): "WBC", "HGB", "HCT", "PLT" in the last 72 hours.    Recent Labs    02/12/22 0638  NA 142  K 3.7  CL 111  CO2 21*  GLUCOSE 95  BUN 32*  CREATININE 1.05  CALCIUM 8.9       Intake/Output Summary (Last 24 hours) at 02/14/2022 1157 Last data filed at 02/14/2022 1108 Gross per 24 hour  Intake 325 ml  Output 1200 ml  Net -875 ml       Pressure Injury 01/13/22 Coccyx Mid Unstageable - Full thickness tissue loss in which the base of the injury is covered by slough (yellow, tan, gray, green or brown) and/or eschar (tan, brown or black) in the wound bed. skin is broken with redness at the (Active)  01/13/22 1930  Location: Coccyx  Location Orientation: Mid  Staging: Unstageable - Full thickness tissue loss in which the base of the injury is covered by slough (yellow, tan, gray, green or brown) and/or eschar (tan, brown or black) in the wound bed.  Wound Description (Comments): skin is broken with redness at the base  Present on Admission: Yes (present on admission to rehab 9/27)    Physical Exam: Vital Signs Blood pressure (!) 143/68, pulse 85, temperature 98.3 F (36.8 C), temperature source Axillary, resp. rate 18, height 5\' 11"  (1.803 m), weight 58.8 kg, SpO2 100 %.     General: awake, alert,, sitting on EOB in enclosure bed, NAD HENT: conjugate gaze; oropharynx moist CV: regular rate; no JVD Pulmonary: CTA B/L; no W/R/R GI: soft, NT, ND, (+)BS Psychiatric: calm Neurological: Ox1 Uro: foley in place, urine remains clear yellow Skin: wounds at knees, both hands, left elbow, right ankle.  Small coccyx wound  present, minimal drainage, no signs of infection. Foam dressings at based of collar  Neuro: alert. Speech clear. Follows commands. Oriented to self only today.  Confused.  Moves all 4 limbs spontaneously but difficut to grade.   Musculoskeletal: no focal jt pain, nl prom. Right arm in sling    Assessment/Plan: 1. Functional deficits which require 3+ hours per day of interdisciplinary therapy in a comprehensive inpatient rehab setting. Physiatrist is providing close team supervision and 24 hour management of active medical problems listed below. Physiatrist and rehab team continue to assess barriers to discharge/monitor patient progress toward functional and medical goals  Care Tool:  Bathing        Body parts bathed by helper: Right arm, Left arm, Chest, Abdomen, Front perineal area, Buttocks, Right upper leg, Left upper leg, Right lower leg, Left lower leg, Face     Bathing assist Assist Level: 2 Helpers     Upper Body Dressing/Undressing Upper body dressing   What is the patient wearing?: Pull over shirt    Upper body assist Assist Level: Dependent - Patient 0%    Lower Body Dressing/Undressing  Lower body dressing            Lower body assist Assist for lower body dressing: 2 Helpers     Toileting Toileting    Toileting assist Assist for toileting: Moderate Assistance - Patient 50 - 74%     Transfers Chair/bed transfer  Transfers assist     Chair/bed transfer assist level: Moderate Assistance - Patient 50 - 74%     Locomotion Ambulation   Ambulation assist   Ambulation activity did not occur: Safety/medical concerns  Assist level: Moderate Assistance - Patient 50 - 74% Assistive device: Hand held assist Max distance: 180   Walk 10 feet activity   Assist  Walk 10 feet activity did not occur: Safety/medical concerns  Assist level: Moderate Assistance - Patient - 50 - 74% Assistive device: Hand held assist   Walk 50 feet activity   Assist  Walk 50 feet with 2 turns activity did not occur: Safety/medical concerns  Assist level: Moderate Assistance - Patient - 50 - 74% Assistive device: Hand held assist    Walk 150 feet activity   Assist Walk 150 feet activity did not occur: Safety/medical concerns  Assist level: Moderate Assistance - Patient - 50 - 74% Assistive device: Hand held assist    Walk 10 feet on uneven surface  activity   Assist Walk 10 feet on uneven surfaces activity did not occur: Safety/medical concerns         Wheelchair     Assist Is the patient using a wheelchair?: Yes Type of Wheelchair: Manual    Wheelchair assist level: Dependent - Patient 0% (TIS)      Wheelchair 50 feet with 2 turns activity    Assist    Wheelchair 50 feet with 2 turns activity did not occur: Safety/medical concerns       Wheelchair 150 feet activity     Assist  Wheelchair 150 feet activity did not occur: Safety/medical concerns       Blood pressure (!) 143/68, pulse 85, temperature 98.3 F (36.8 C), temperature source Axillary, resp. rate 18, height 5\' 11"  (1.803 m), weight 58.8 kg, SpO2 100 %.  Medical Problem List and Plan: 1. 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   -minimal/slow progress d/t waxing and waning behavior which is due to premorbid dementia, age, and TBI  Con't CIR- PT, OT and SLP- looking at SNF 2.  Antithrombotics: -DVT/anticoagulation:  Pharmaceutical: Lovenox 30 mg BID             -antiplatelet therapy: aspirin 81 mg daily 3. Pain Management: continue Tylenol per tube scheduled,\; oxycodone, Robaxin prn  10/23 Pt denies pain this AM 4. Mood/Behavior/Sleep: LCSW to evaluate and provide emotional support             -antipsychotic agents: risperdal             -probably mild baseline dementia (ex-wife confirmed this)  -continue sleep chart   -10/20 did better in  enclosure bed --continue   -continue risperdal at lunch and in evening   -minimize any other neurosedating meds   -remove NGT  10/23 continue enclosure bed due to confusion and intermittent agitation 5. Neuropsych/cognition: This patient is not capable of making decisions on his own behalf.  6. Skin/Wound Care: routine skin care checks             -monitor scalp lac/skin abrasions             -appreciate WOC recs for unstageable sacral/coccyx pressure wound   -continue medihoney/foam dressing 7. Fluids/Electrolytes/Nutrition/dysphagia: Strict Is and Os and follow-up chemistries             -MBS --diet initiated D2/thins  10/12-decided to keep cortrak in for now, decreased HS TF  -pt appears to have eaten a little more this morning  -discussed PO intake with pt. Demonstrated SOME insight  -BMET WNL  -added megace to help stimulate appetite on 10/12  -10/20-ate well again for second consecutive day   -will dc cortrak   -add IVF at HS   -he's not on many medications fortunately    -needs risperdal for behavior--use disintegrating form -check BMET 10/21  10/22- BUN up to 32- will increase night time IVFs to 75 cc /hour for 12 hours/night 8: Right distal clavicle and humerus fracture:    18: Elevated transaminases: trending downward  -AST 28 and ALT 34 10/6, improved 19:  ?aspiration pneumonia w/x hx of staph and pseudomonas - vancomycin and cefepime total of 7 more days ended 10/5             -10/18 WBC 8/5  20: SVT/sinus tach intermittently: monitor 21: Mediastinal hematoma: Echo WNL; Hgb stable 22: Ankle edema and ecchymosis: x-rays negative for osseous abnormality           Continue TEDS, elevation in bed 23. Loose stools improved. Had been constipated.   -10/23 LBM yesterday type 6, continue miralax for now 24. Normocytic anemia/ABLA: likely related to hospitalization and chronic disease  -no gross blood loss  continue b complex and iron  -10/18 9.1  -Recheck  CBC tomorrow 25. Hypoglycemia:     -he's not diabetic. Dc'ed cbg checks.     LOS: 26 days A FACE TO FACE EVALUATION WAS PERFORMED  Jennye Boroughs 02/14/2022, 11:57 AM

## 2022-02-14 NOTE — Progress Notes (Signed)
Occupational Therapy TBI Note  Patient Details  Name: Dominic Smith MRN: [id number] Date of Birth: 12/28/1940  Today's Date: 02/14/2022 OT Individual Time: 7939-0300 OT Individual Time Calculation (min): 43 min    Short Term Goals: Week 4:  OT Short Term Goal 1 (Week 4): Pt will complete toilet transfer with MIN A consistently OT Short Term Goal 2 (Week 4): Pt will complete 1/3 steps of LB dressing OT Short Term Goal 3 (Week 4): Pt will be oriented to place 25% of encounters  Skilled Therapeutic Interventions/Progress Updates:    Patient in enclosure bed upon therapy arrival with eyes open, laying diagonal. Reports 0/10 pain level. Due to foley tubing placement in enclosure bed, pt required to do bedside therapy with OT.   Patient participated in skilled OT session focusing on cognitive skills development while participating in simple card matching to work on visual scanning, direction following, and attention. Therapist facilitated activity participation by providing pt with 10 cards face up and providing simple 1 step instructions to locate matching number card that was provided to him in order to improve cognition such as activity sequencing during self care tasks. Pt was unable to locate matching number cards when requested. Task then modified with therapist pointing to each card asking pt if it matched the one that she held in her hand. Pt was able to verbalize "yes or No" correctly identifying if card was the number he was looking for. Pt follow one step commands inconsistently. He was asked to pick up the card from the table; he did not. Card was placed in his hand and he was asked to place it on the discard pile to his left on tray table; he could not. Overall, pt required total assist for simple cognitive matching task. Required direct one on one attention with max assist to redirect to task.    Therapy Documentation Precautions:  Precautions Precautions: Fall, Cervical Precaution  Booklet Issued: No Precaution Comments: cortrak, bilateral soft mitts Required Braces or Orthoses: Sling, Cervical Brace Cervical Brace: At all times, Hard collar Restrictions Weight Bearing Restrictions: Yes RUE Weight Bearing: Non weight bearing Other Position/Activity Restrictions: in sling, nonoperative mgmt per ortho 9/18 Agitated Behavior Scale: TBI Observation Details Observation Environment: pt's room Start of observation period - Date: 02/14/22 Start of observation period - Time: 1402 End of observation period - Date: 02/14/22 End of observation period - Time: 1445 Agitated Behavior Scale (DO NOT LEAVE BLANKS) Short attention span, easy distractibility, inability to concentrate: Present to an extreme degree Impulsive, impatient, low tolerance for pain or frustration: Absent Uncooperative, resistant to care, demanding: Present to a slight degree Violent and/or threatening violence toward people or property: Absent Explosive and/or unpredictable anger: Absent Rocking, rubbing, moaning, or other self-stimulating behavior: Present to a slight degree Pulling at tubes, restraints, etc.: Present to a slight degree Wandering from treatment areas: Absent Restlessness, pacing, excessive movement: Present to a slight degree Repetitive behaviors, motor, and/or verbal: Present to a slight degree Rapid, loud, or excessive talking: Absent Sudden changes of mood: Absent Easily initiated or excessive crying and/or laughter: Absent Self-abusiveness, physical and/or verbal: Absent Agitated behavior scale total score: 22   Therapy/Group: Individual Therapy  Ailene Ravel, OTR/L,CBIS  Supplemental OT - [facility]  02/14/2022, 4:19 PM

## 2022-02-14 NOTE — Progress Notes (Signed)
Nutrition Follow-up  DOCUMENTATION CODES:   Not applicable  INTERVENTION:   Feeding assist with all meals Boost Breeze po TID, each supplement provides 250 kcal and 9 grams of protein  NUTRITION DIAGNOSIS:   Altered GI function related to diarrhea as evidenced by other (comment) (multiple loose BMs per RN). - Progressing   GOAL:   Patient will meet greater than or equal to 90% of their needs - Being addressed via TF  MONITOR:   PO intake, Supplement acceptance, I & O's, Labs, Weight trends  REASON FOR ASSESSMENT:   Consult Enteral/tube feeding initiation and management, Other (Comment) (loose stools)  ASSESSMENT:   81 y.o. 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 PMH reviewed.  10/09 - diet advanced to Dysphagia 2, thin liquids 10/20 - Cortrak removed   Discussed case with RN. Pt continues to be A&O x 1. Reports that pt did not eat well for breakfast this morning and has a poor appetite.   Inconsistent weight, recommend obtaining new weight.  Admission Weight: 69.6 kg Current Weight: 58.8 kg  Meal Documentation 10/13-10/17: 25-50% x 6 meals (average 43%) 10/21-10/23: 0-100% x 7 meals (average 53%)  Medications reviewed and include: B complex w/ Vitamin C, Miralax  Labs reviewed.  UOP: 550 mL x 24 hours   Diet Order:   Diet Order             DIET DYS 2 Room service appropriate? Yes; Fluid consistency: Thin  Diet effective now                   EDUCATION NEEDS:   Not appropriate for education at this time  Skin:  Skin Assessment: Skin Integrity Issues: Skin Integrity Issues:: Unstageable Unstageable: Unstageable wound to coccyx  Last BM:  10/22  Height:   Ht Readings from Last 1 Encounters:  01/19/22 5\' 11"  (1.803 m)    Weight:   Wt Readings from Last 1 Encounters:  02/13/22 58.8 kg    Ideal Body Weight:  78.2 kg  BMI:  Body mass index is 18.08 kg/m.  Estimated Nutritional  Needs:  Kcal:  1800-2000 Protein:  90-105 grams Fluid:  >/= 1.8 L   Hermina Barters RD, LDN Clinical Dietitian See [facility] for contact information.

## 2022-02-15 ENCOUNTER — Inpatient Hospital Stay (HOSPITAL_COMMUNITY): Payer: Medicare Other

## 2022-02-15 DIAGNOSIS — S42291A Other displaced fracture of upper end of right humerus, initial encounter for closed fracture: Secondary | ICD-10-CM

## 2022-02-15 LAB — COMPREHENSIVE METABOLIC PANEL
ALT: 25 U/L (ref 0–44)
AST: 22 U/L (ref 15–41)
Albumin: 2.8 g/dL — ABNORMAL LOW (ref 3.5–5.0)
Alkaline Phosphatase: 119 U/L (ref 38–126)
Anion gap: 8 (ref 5–15)
BUN: 20 mg/dL (ref 8–23)
CO2: 21 mmol/L — ABNORMAL LOW (ref 22–32)
Calcium: 8.7 mg/dL — ABNORMAL LOW (ref 8.9–10.3)
Chloride: 109 mmol/L (ref 98–111)
Creatinine, Ser: 1.03 mg/dL (ref 0.61–1.24)
GFR, Estimated: >60 mL/min (ref >60)
Glucose, Bld: 94 mg/dL (ref 70–99)
Potassium: 3.4 mmol/L — ABNORMAL LOW (ref 3.5–5.1)
Sodium: 138 mmol/L (ref 135–145)
Total Bilirubin: 1.1 mg/dL (ref 0.3–1.2)
Total Protein: 6.8 g/dL (ref 6.5–8.1)

## 2022-02-15 LAB — CBC
HCT: 29.9 % — ABNORMAL LOW (ref 39.0–52.0)
Hemoglobin: 9.7 g/dL — ABNORMAL LOW (ref 13.0–17.0)
MCH: 28.4 pg (ref 26.0–34.0)
MCHC: 32.4 g/dL (ref 30.0–36.0)
MCV: 87.4 fL (ref 80.0–100.0)
Platelets: 324 10*3/uL (ref 150–400)
RBC: 3.42 MIL/uL — ABNORMAL LOW (ref 4.22–5.81)
RDW: 13.2 % (ref 11.5–15.5)
WBC: 8.8 10*3/uL (ref 4.0–10.5)
nRBC: 0 % (ref 0.0–0.2)

## 2022-02-15 NOTE — Progress Notes (Signed)
SLP Cancellation Note  Patient Details Name: Dominic Smith MRN: [id number] DOB: [birth year]   Cancelled treatment:       Patient missed 45 minutes of skilled SLP intervention due to fatigue. Upon arrival, patient was asleep in bed. SLP provided Max A verbal and tactile cues as well as environmental modifications to maximize arousal with minimal success. Patient would open his eyes for ~5 seconds but would quickly close them. Patient also not responsive to questions. Will re-attempt as able.                                                                                                Jordana Dugue, [place] 02/15/2022, 10:13 AM

## 2022-02-15 NOTE — Plan of Care (Signed)
After speaking with the care team and as discussed with MD in team conference, the patient's plan of care has been adjusted to QD as the patient is currently unable to tolerate the current therapy schedule with OT, PT, and SLP.

## 2022-02-15 NOTE — Patient Care Conference (Signed)
Inpatient RehabilitationTeam Conference and Plan of Care Update Date: 02/15/2022   Time: 10:23 AM    Patient Name: Dominic Smith      Medical Record Number: [id number]  Date of Birth: 1940-07-01 Sex: Male         Room/Bed: 4W10C/4W10C-01 Payor Info: Payor: MEDICARE / Plan: MEDICARE PART A / Product Type: *No Product type* /    Admit Date/Time:  01/19/2022  2:31 PM  Primary Diagnosis:  TBI (traumatic brain injury) [facility])  Hospital Problems: Principal Problem:   TBI (traumatic brain injury) ([place]) Active Problems:   HCAP (healthcare-associated pneumonia)   Azotemia   Urinary retention   Hypervolemia   Closed fracture of head of right humerus    Expected Discharge Date: Expected Discharge Date:  (SNF)  Team Members Present: Physician leading conference: Dr. Jennye Boroughs Social Worker Present: Loralee Pacas, [place] Nurse Present: Other (comment) Tacy Learn, RN) PT Present: Apolinar Junes, PT OT Present: Mariane Masters, OT SLP Present: Weston Anna, SLP PPS Coordinator present : Ileana Ladd, PT     Current Status/Progress Goal Weekly Team Focus  Bowel/Bladder   Incont of bowel. Foley cath in place.  Regain cont of bowel.  Assess qshift & PRN   Swallow/Nutrition/ Hydration   Dys. 2 textures with thin liquids, Mod A  Min A  tolerance of curent diet, use of swallowing compensatory strategies   ADL's   min-mod A SPT, MAX A for ADLs funciotnally, remains with increased confusion and disorientation + confabulation  MOD A  BADLs, trnasfers, basic orientation, funciotnal mobility   Mobility   Orthostasis improved this week, fluctuations in mobility and cognition from max A-min A, afternoons better than mornings, ambulating 200 ft min-mod A with L HHA  Min A overall  Activity tolerance, functional mobility, motor planning, consistency and carry-over with mobility, initiation, orientation, gait and stiar training, balance, behavior management,  patient/caregiver education   Communication             Safety/Cognition/ Behavioral Observations  [place] Level V: Max-Total A with language of confusion and confabulation  Mod A  sustained attention, orientaton, awareness   Pain   2 of 10 PAINAD  Decrease in pain  Assess qshift & PRN   Skin   Unstagable to buttocks and MASD  Promote healing and prevention of infection.  Assess qshift & PRN     Discharge Planning:  Pt will discharge to SNF. D/c pending medical readiness. Family exploring preferred SNFs at this time.   Team Discussion: TBI. Incontinent of bowel. Foley remains in place. Denies pain. Healing wound to buttocks as well as MASD. Enclosure bed. Therapies limited due to increased confusion. Fluctuates from mod/max to max/total assist. Seems to do better in afternoon. Labs ordered.   Patient on target to meet rehab goals: no, slow to progress  *See Care Plan and progress notes for long and short-term goals.   Revisions to Treatment Plan:  Medication review, monitor labs Teaching Needs: Medications, safety, skin/wound care, gait/transfer training, etc  Current Barriers to Discharge: Medical stability, Home enviroment access/layout, Incontinence, Wound care, Lack of/limited family support, Weight, and Behavior  Possible Resolutions to Barriers: Family education, SNF acceptance, medical readiness.     Medical Summary Current Status: TBI, confusion, agitation, anemia, loose BMs, azotemia  Barriers to Discharge: Behavior;Incontinence;Home enviroment access/layout;Medical stability  Barriers to Discharge Comments: TBI, confusion, agitation, anemia, loose BMs, azotemia Possible Resolutions to [employer] Focus: monitor labs, enclosure beds, modified diet, monitor sleep   Continued Need for Acute Rehabilitation  Level of Care: The patient requires daily medical management by a physician with specialized training in physical medicine and rehabilitation for the following  reasons: Direction of a multidisciplinary physical rehabilitation program to maximize functional independence : Yes Medical management of patient stability for increased activity during participation in an intensive rehabilitation regime.: Yes Analysis of laboratory values and/or radiology reports with any subsequent need for medication adjustment and/or medical intervention. : Yes   I attest that I was present, lead the team conference, and concur with the assessment and plan of the team.   Ernest Pine 02/15/2022, 1:45 PM

## 2022-02-15 NOTE — Progress Notes (Signed)
Unable to assess wound on coccyx due to patient not complying and refusing treatment. Writer has attempted 3 times to view wound with patient refusing each time. Will let oncoming nurse know to attempt to assess.

## 2022-02-15 NOTE — Progress Notes (Signed)
Physical Therapy TBI Note  Patient Details  Name: Dominic Smith MRN: [id number] Date of Birth: 1941/01/31  Today's Date: 02/15/2022 PT Individual Time: 0800-0840 PT Individual Time Calculation (min): 40 min   Short Term Goals: Week 4:  PT Short Term Goal 1 (Week 4): Patient will perform bed mobility with min A consistently. PT Short Term Goal 2 (Week 4): Patient will perform basic transfers with min A >75% of the time. PT Short Term Goal 3 (Week 4): Patient will ambulate >200 feet with min A using LRAD without additional assist for LOB. PT Short Term Goal 4 (Week 4): Patient will perform dynamic standing balance >2 min with min A.  Skilled Therapeutic Interventions/Progress Updates:     Patient in bed with NT finishing breakfast with patient and RN arriving to administer morning medications upon PT arrival. Per NT, patient ate ~25% of breakfast before refusing. Patient intermittently alert, agreeable to getting OOB initially. Patient with minimal verbal responses this morning. Patient required total A to initiate mobility and mod A for supine to sit and stand pivot bed<>w/c. While in w/c RN attempted to provide patient with crushed meds. Patient refused x3, closing his mouth and shaking his head no. Provided orientation, education on meds, and encouragement without success. Patient then indicated he wanted to get back to bed. Returned to bed and PT cued patient to lie down. Patient initiated lying with max cues and increased time and performed sit to supine with supervision lying diagonal in the bed without attempts to readjust. Repositioned and scooted up patient in the bed with total A using chuck pad.   Obtained sitting and standing BP during transfer back to bed to assess orthostatic hypotension without interventions donned. Orthostatic Vitals: Sitting: BP 132/87 Standing: BP 110/76 Patient did not report symptoms, but limited by cognitive deficits this morning.   Patient fell asleep  once in lying. PT donned B thigh high TED hose with total A and retrieved 4'x4" ACE wraps for [facility] management.   Patient in secured enclosure bed with Telesitter in the room at end of session with breaks locked and all needs within reach.   Therapy Documentation Precautions:  Precautions Precautions: Fall, Cervical Precaution Booklet Issued: No Precaution Comments: cortrak, bilateral soft mitts Required Braces or Orthoses: Sling, Cervical Brace Cervical Brace: At all times, Hard collar Restrictions Weight Bearing Restrictions: Yes RUE Weight Bearing: Non weight bearing Other Position/Activity Restrictions: in sling, nonoperative mgmt per ortho 9/18    Therapy/Group: Individual Therapy  Antolin Belsito L Annmarie Plemmons PT, DPT, NCS, CBIS  02/15/2022, 11:24 AM

## 2022-02-15 NOTE — Progress Notes (Signed)
Patient ID: Dominic Smith, male   DOB: [birth year], 81 y.o.   MRN: [id number]  [id number] left message for pt ex-wife [name] to provide updates from team conference, and any progress on SNF at this time. SW waiting on follow-up.   [id number] spoke with pt son [name] to follow-up with updates from team conference, pt not medically ready for transition at this time. He reports he was trying to work on an alternative plan for pt in which he was trying to locate a camper for him to stay on their land, but not able find one. SW reiterated once pt is medically ready for SNF, process will move quickly. SW also discussed importance of seeing if pt is eligible for Medicaid as if pt does not improve within the first 20 days there is a co-pay. He will follow-up with his mother to discuss further. SW encouraged follow-up if needed.   Loralee Pacas, MSW, [place] Office: [phone] Cell: [phone] Fax: [phone]

## 2022-02-15 NOTE — Progress Notes (Signed)
Occupational Therapy TBI Note  Patient Details  Name: Dominic Smith MRN: [id number] Date of Birth: August 04, 1940  Today's Date: 02/15/2022 OT Individual Time: 1100-1200 OT Individual Time Calculation (min): 60 min    Short Term Goals: Week 1:  OT Short Term Goal 1 (Week 1): Pt will consistently transfer to toilet with 1 caregiver OT Short Term Goal 1 - Progress (Week 1): Met OT Short Term Goal 2 (Week 1): Pt will complete 1/4 steps of donning shirt OT Short Term Goal 2 - Progress (Week 1): Progressing toward goal OT Short Term Goal 3 (Week 1): Pt will complete oral care with MOD A OT Short Term Goal 3 - Progress (Week 1): Progressing toward goal OT Short Term Goal 4 (Week 1): Pt will sit to stand with MOD A or less OT Short Term Goal 4 - Progress (Week 1): Met  Skilled Therapeutic Interventions/Progress Updates:     Pt received in bed with Xray tech present asking about bed features to image arm. Pt restless in bed. ADL: Pt completes ADL at overall Min-MOD Level for sit to stand at sink dressing! Skilled interventions include: increased time for pt to process commands, A to imitate doffing shirt and pulling on sleeve not IV guard on LUE to doff, and visual cues to place correct limbs in the correct sleeves. Pt needs A to thread 1st LE and catheter but then able to thread second and advance pants pasthips.  Therapeutic activity Used simple cuing to assist pt into positions to allow Xray tech to take images. Pt disoriented in conversation throughout asking about "going fast." Pt unable to follow commands for bed mobility therefore pillows used to keep pt in sidelying for last image then total A for supine>sit at EOB.  Pt left at end of session in TIS, waist retrain and mitts applied  call light in reach, telesitter directly in front of pt and all needs met   Therapy Documentation Precautions:  Precautions Precautions: Fall, Cervical Precaution Booklet Issued: No Precaution Comments:  cortrak, bilateral soft mitts Required Braces or Orthoses: Sling, Cervical Brace Cervical Brace: At all times, Hard collar Restrictions Weight Bearing Restrictions: Yes RUE Weight Bearing: Non weight bearing Other Position/Activity Restrictions: in sling, nonoperative mgmt per ortho 9/18 Agitated Behavior Scale: TBI  Observation Details Observation Environment: CIR Start of observation period - Date: 02/15/22 Start of observation period - Time: 1100 End of observation period - Date: 02/15/22 End of observation period - Time: 1200  Agitated Behavior Scale (DO NOT LEAVE BLANKS) Short attention span, easy distractibility, inability to concentrate: Present to a moderate degree Impulsive, impatient, low tolerance for pain or frustration: Absent Uncooperative, resistant to care, demanding: Absent Violent and/or threatening violence toward people or property: Absent Explosive and/or unpredictable anger: Absent Rocking, rubbing, moaning, or other self-stimulating behavior: Present to a slight degree Pulling at tubes, restraints, etc.: Present to a moderate degree Wandering from treatment areas: Absent Restlessness, pacing, excessive movement: Present to a moderate degree Repetitive behaviors, motor, and/or verbal: Present to a slight degree Rapid, loud, or excessive talking: Absent Sudden changes of mood: Absent Easily initiated or excessive crying and/or laughter: Absent Self-abusiveness, physical and/or verbal: Absent Agitated behavior scale total score: 22  Therapy/Group: Individual Therapy  [name] 02/15/2022, 12:16 PM

## 2022-02-15 NOTE — Progress Notes (Signed)
PROGRESS NOTE   Subjective/Complaints:  Asleep in canopy bed this AM.  Wakes briefly but then falls back to sleep.    ROS: limited by cognition  Objective:   No results found.  No results for input(s): "WBC", "HGB", "HCT", "PLT" in the last 72 hours.    No results for input(s): "NA", "K", "CL", "CO2", "GLUCOSE", "BUN", "CREATININE", "CALCIUM" in the last 72 hours.     Intake/Output Summary (Last 24 hours) at 02/15/2022 0831 Last data filed at 02/15/2022 0801 Gross per 24 hour  Intake 467 ml  Output 1350 ml  Net -883 ml       Pressure Injury 01/13/22 Coccyx Mid Unstageable - Full thickness tissue loss in which the base of the injury is covered by slough (yellow, tan, gray, green or brown) and/or eschar (tan, brown or black) in the wound bed. skin is broken with redness at the (Active)  01/13/22 1930  Location: Coccyx  Location Orientation: Mid  Staging: Unstageable - Full thickness tissue loss in which the base of the injury is covered by slough (yellow, tan, gray, green or brown) and/or eschar (tan, brown or black) in the wound bed.  Wound Description (Comments): skin is broken with redness at the base  Present on Admission: Yes (present on admission to rehab 9/27)    Physical Exam: Vital Signs Blood pressure (!) 144/96, pulse (!) 103, temperature 98 F (36.7 C), resp. rate 18, height 5\' 11"  (1.803 m), weight 58.8 kg, SpO2 100 %.     General: awake, alert,,sleeping in enclosure bed, NAD HENT: conjugate gaze; oropharynx moist CV: regular rate; no JVD Pulmonary: CTA B/L; no W/R/R GI: soft, NT, ND, (+)BS Psychiatric: calm Neurological: asleep this AM, wakes briefly Uro: foley in place, urine remains clear yellow Skin: wounds at knees, both hands, left elbow, right ankle.  Small coccyx wound present, minimal drainage, no signs of infection. Foam dressings at based of collar  Neuro: Sleeping,. Speech  clear. Follows commands. Oriented to self only.  Confused.  Moves all 4 limbs spontaneously but difficut to grade.   Musculoskeletal: no focal jt pain, nl prom. Right arm in sling    Assessment/Plan: 1. Functional deficits which require 3+ hours per day of interdisciplinary therapy in a comprehensive inpatient rehab setting. Physiatrist is providing close team supervision and 24 hour management of active medical problems listed below. Physiatrist and rehab team continue to assess barriers to discharge/monitor patient progress toward functional and medical goals  Care Tool:  Bathing        Body parts bathed by helper: Right arm, Left arm, Chest, Abdomen, Front perineal area, Buttocks, Right upper leg, Left upper leg, Right lower leg, Left lower leg, Face     Bathing assist Assist Level: 2 Helpers     Upper Body Dressing/Undressing Upper body dressing   What is the patient wearing?: Pull over shirt    Upper body assist Assist Level: Dependent - Patient 0%    Lower Body Dressing/Undressing Lower body dressing            Lower body assist Assist for lower body dressing: 2 Helpers     Toileting Toileting    Toileting assist  Assist for toileting: Moderate Assistance - Patient 50 - 74%     Transfers Chair/bed transfer  Transfers assist     Chair/bed transfer assist level: Moderate Assistance - Patient 50 - 74%     Locomotion Ambulation   Ambulation assist   Ambulation activity did not occur: Safety/medical concerns  Assist level: Moderate Assistance - Patient 50 - 74% Assistive device: Hand held assist Max distance: 180   Walk 10 feet activity   Assist  Walk 10 feet activity did not occur: Safety/medical concerns  Assist level: Moderate Assistance - Patient - 50 - 74% Assistive device: Hand held assist   Walk 50 feet activity   Assist Walk 50 feet with 2 turns activity did not occur: Safety/medical concerns  Assist level: Moderate Assistance -  Patient - 50 - 74% Assistive device: Hand held assist    Walk 150 feet activity   Assist Walk 150 feet activity did not occur: Safety/medical concerns  Assist level: Moderate Assistance - Patient - 50 - 74% Assistive device: Hand held assist    Walk 10 feet on uneven surface  activity   Assist Walk 10 feet on uneven surfaces activity did not occur: Safety/medical concerns         Wheelchair     Assist Is the patient using a wheelchair?: Yes Type of Wheelchair: Manual    Wheelchair assist level: Dependent - Patient 0% (TIS)      Wheelchair 50 feet with 2 turns activity    Assist    Wheelchair 50 feet with 2 turns activity did not occur: Safety/medical concerns       Wheelchair 150 feet activity     Assist  Wheelchair 150 feet activity did not occur: Safety/medical concerns       Blood pressure (!) 144/96, pulse (!) 103, temperature 98 F (36.7 C), resp. rate 18, height 5\' 11"  (1.803 m), weight 58.8 kg, SpO2 100 %.  Medical Problem List and Plan: 1. 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   -minimal/slow progress d/t waxing and waning behavior which is due to premorbid dementia, age, and TBI  Con't CIR- PT, OT and SLP- looking at [facility]  -Team conference today 2.  Antithrombotics: -DVT/anticoagulation:  Pharmaceutical: Lovenox 30 mg BID             -antiplatelet therapy: aspirin 81 mg daily 3. Pain Management: continue Tylenol per tube scheduled,\; oxycodone, Robaxin prn  10/23 Pt denies pain this AM 4. Mood/Behavior/Sleep: LCSW to evaluate and provide emotional support             -antipsychotic agents: risperdal             -probably mild baseline dementia (ex-wife confirmed this)  -continue sleep chart   -10/20 did better in enclosure bed --continue   -continue risperdal at lunch and in evening   -minimize any other neurosedating  meds   -remove NGT  10/24 Continue enclosure bed due to intermittent agitation and confusion, consider decrease risperdal 5. Neuropsych/cognition: This patient is not capable of making decisions on his own behalf.               6. Skin/Wound Care: routine skin care checks             -monitor scalp lac/skin abrasions             -  appreciate WOC recs for unstageable sacral/coccyx pressure wound   -continue medihoney/foam dressing 7. Fluids/Electrolytes/Nutrition/dysphagia: Strict Is and Os and follow-up chemistries             -MBS --diet initiated D2/thins  10/12-decided to keep cortrak in for now, decreased HS TF  -pt appears to have eaten a little more this morning  -discussed PO intake with pt. Demonstrated SOME insight  -BMET WNL  -added megace to help stimulate appetite on 10/12  -10/20-ate well again for second consecutive day   -will dc cortrak   -add IVF at HS   -he's not on many medications fortunately    -needs risperdal for behavior--use disintegrating form -check BMET 10/21  10/22- BUN up to 32- will increase night time IVFs to 75 cc /hour for 12 hours/night 10/23 recheck labs today 8: Right distal clavicle and humerus fracture:    -10/24 Contacted ortho regarding f/u for these fractures, they plan to repeat xrays 18: Elevated transaminases: trending downward  -AST 28 and ALT 34 10/6, improved 19:  ?aspiration pneumonia w/x hx of staph and pseudomonas - vancomycin and cefepime total of 7 more days ended 10/5             -10/18 WBC 8/5  Recheck WBC today  20: SVT/sinus tach intermittently: monitor 21: Mediastinal hematoma: Echo WNL; Hgb stable 22: Ankle edema and ecchymosis: x-rays negative for osseous abnormality           Continue TEDS, elevation in bed 23. Loose stools improved. Had been constipated.   -10/24 LBM yesterday, type 6, having regular Bms, continue miralax for now 24. Normocytic anemia/ABLA: likely related to hospitalization and chronic disease  -no  gross blood loss  continue b complex and iron  -10/18 9.1  -Recheck CBC today 25. Hypoglycemia:     -he's not diabetic. Dc'ed cbg checks.     LOS: 27 days A FACE TO FACE EVALUATION WAS PERFORMED  Jennye Boroughs 02/15/2022, 8:31 AM

## 2022-02-16 ENCOUNTER — Other Ambulatory Visit: Payer: Self-pay

## 2022-02-16 DIAGNOSIS — E876 Hypokalemia: Secondary | ICD-10-CM

## 2022-02-16 MED ORDER — POTASSIUM CHLORIDE 20 MEQ PO PACK
20.0000 meq | PACK | Freq: Two times a day (BID) | ORAL | Status: AC
  Administered 2022-02-16 – 2022-02-17 (×2): 20 meq via ORAL
  Filled 2022-02-16 (×2): qty 1

## 2022-02-16 MED ORDER — RISPERIDONE 0.5 MG PO TBDP
0.5000 mg | ORAL_TABLET | Freq: Every day | ORAL | Status: DC
  Administered 2022-02-16 – 2022-02-21 (×6): 0.5 mg via ORAL
  Filled 2022-02-16 (×6): qty 1

## 2022-02-16 NOTE — Progress Notes (Signed)
PROGRESS NOTE   Subjective/Complaints:  Pt in canopy bed this AM. No new concerns elicited. He denies pain.  Xrays reviewed by ortho today, continue sling and NWB RUE recommended.    ROS: limited by cognition  Objective:   DG Shoulder Right  Result Date: 02/15/2022 CLINICAL DATA:  Humerus fracture. EXAM: RIGHT SHOULDER - 2+ VIEW COMPARISON:  01/19/2022 and 01/10/2022 FINDINGS: Again noted are fractures involving the distal right clavicle and the proximal right humerus. Alignment of the distal clavicle fracture has minimally changed and there is evidence for some callus formation. Residual lucency at the proximal humeral fracture. Humeral fracture appears to involve the greater tuberosity and humeral neck region. Probable loose bodies inferior to the glenohumeral joint are similar to the previous examination. Right shoulder is located. Degenerative changes at the [facility] joint. IMPRESSION: 1. Minimal change in alignment of the distal right clavicle fracture. Evidence for some callus formation. 2. No significant change in the appearance of the proximal right humerus fracture. Fracture line remains visible. Electronically Signed   By: Markus Daft M.D.   On: 02/15/2022 12:01    Recent Labs    02/15/22 1034  WBC 8.8  HGB 9.7*  HCT 29.9*  PLT 324      Recent Labs    02/15/22 1034  NA 138  K 3.4*  CL 109  CO2 21*  GLUCOSE 94  BUN 20  CREATININE 1.03  CALCIUM 8.7*       Intake/Output Summary (Last 24 hours) at 02/16/2022 0825 Last data filed at 02/16/2022 0447 Gross per 24 hour  Intake 4824.24 ml  Output 575 ml  Net 4249.24 ml       Pressure Injury 01/13/22 Coccyx Mid Unstageable - Full thickness tissue loss in which the base of the injury is covered by slough (yellow, tan, gray, green or brown) and/or eschar (tan, brown or black) in the wound bed. skin is broken with redness at the (Active)  01/13/22 1930  Location:  Coccyx  Location Orientation: Mid  Staging: Unstageable - Full thickness tissue loss in which the base of the injury is covered by slough (yellow, tan, gray, green or brown) and/or eschar (tan, brown or black) in the wound bed.  Wound Description (Comments): skin is broken with redness at the base  Present on Admission: Yes (present on admission to rehab 9/27)    Physical Exam: Vital Signs Blood pressure (!) 159/59, pulse 85, temperature 97.6 F (36.4 C), temperature source Oral, resp. rate 16, height 5\' 11"  (1.803 m), weight 57 kg, SpO2 100 %.     General: awake, alert,,sleeping in enclosure bed, NAD HENT: conjugate gaze; oropharynx moist CV: regular rate; no JVD Pulmonary: CTA B/L; no W/R/R GI: soft, NT, ND, (+)BS Psychiatric: calm Neurological: sleeping initially but wakes to voice Uro: foley in place, urine remains clear yellow Skin: wounds at knees, both hands, left elbow, right ankle.  Small coccyx wound present, minimal drainage, no signs of infection. Foam dressings at based of collar  Neuro: Sleeping but wakes to voice, Speech clear. Follows commands. Oriented to self and [place] today. Confused.  Moves all 4 limbs spontaneously but difficut to grade.   Musculoskeletal: no  focal jt pain, nl prom. Right arm in sling    Assessment/Plan: 1. Functional deficits which require 3+ hours per day of interdisciplinary therapy in a comprehensive inpatient rehab setting. Physiatrist is providing close team supervision and 24 hour management of active medical problems listed below. Physiatrist and rehab team continue to assess barriers to discharge/monitor patient progress toward functional and medical goals  Care Tool:  Bathing        Body parts bathed by helper: Right arm, Left arm, Chest, Abdomen, Front perineal area, Buttocks, Right upper leg, Left upper leg, Right lower leg, Left lower leg, Face     Bathing assist Assist Level: 2 Helpers     Upper Body  Dressing/Undressing Upper body dressing   What is the patient wearing?: Pull over shirt    Upper body assist Assist Level: Dependent - Patient 0%    Lower Body Dressing/Undressing Lower body dressing            Lower body assist Assist for lower body dressing: 2 Helpers     Toileting Toileting    Toileting assist Assist for toileting: Moderate Assistance - Patient 50 - 74%     Transfers Chair/bed transfer  Transfers assist     Chair/bed transfer assist level: Moderate Assistance - Patient 50 - 74%     Locomotion Ambulation   Ambulation assist   Ambulation activity did not occur: Safety/medical concerns  Assist level: Moderate Assistance - Patient 50 - 74% Assistive device: Hand held assist Max distance: 180   Walk 10 feet activity   Assist  Walk 10 feet activity did not occur: Safety/medical concerns  Assist level: Moderate Assistance - Patient - 50 - 74% Assistive device: Hand held assist   Walk 50 feet activity   Assist Walk 50 feet with 2 turns activity did not occur: Safety/medical concerns  Assist level: Moderate Assistance - Patient - 50 - 74% Assistive device: Hand held assist    Walk 150 feet activity   Assist Walk 150 feet activity did not occur: Safety/medical concerns  Assist level: Moderate Assistance - Patient - 50 - 74% Assistive device: Hand held assist    Walk 10 feet on uneven surface  activity   Assist Walk 10 feet on uneven surfaces activity did not occur: Safety/medical concerns         Wheelchair     Assist Is the patient using a wheelchair?: Yes Type of Wheelchair: Manual    Wheelchair assist level: Dependent - Patient 0% (TIS)      Wheelchair 50 feet with 2 turns activity    Assist    Wheelchair 50 feet with 2 turns activity did not occur: Safety/medical concerns       Wheelchair 150 feet activity     Assist  Wheelchair 150 feet activity did not occur: Safety/medical concerns        Blood pressure (!) 159/59, pulse 85, temperature 97.6 F (36.4 C), temperature source Oral, resp. rate 16, height 5\' 11"  (1.803 m), weight 57 kg, SpO2 100 %.  Medical Problem List and Plan: 1. 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   -minimal/slow progress d/t waxing and waning behavior which is due to premorbid dementia, age, and TBI  Con't CIR- PT, OT and SLP- looking at SNF  -Adjusted to QD 2.  Antithrombotics: -DVT/anticoagulation:  Pharmaceutical: Lovenox 30 mg BID             -antiplatelet therapy: aspirin 81 mg daily 3. Pain Management: continue Tylenol per tube scheduled,\; oxycodone, Robaxin prn  10/23 Pt denies pain this AM 4. Mood/Behavior/Sleep: LCSW to evaluate and provide emotional support             -antipsychotic agents: risperdal             -probably mild baseline dementia (ex-wife confirmed this)  -continue sleep chart   -10/20 did better in enclosure bed --continue   -continue risperdal at lunch and in evening   -minimize any other neurosedating meds   -remove NGT  10/24 Continue enclosure bed due to intermittent agitation and confusion, consider decrease risperdal  10/25 decrease risperdal to 0.5mg  HS 5. Neuropsych/cognition: This patient is not capable of making decisions on his own behalf.               6. Skin/Wound Care: routine skin care checks             -monitor scalp lac/skin abrasions             -appreciate WOC recs for unstageable sacral/coccyx pressure wound   -continue medihoney/foam dressing 7. Fluids/Electrolytes/Nutrition/dysphagia: Strict Is and Os and follow-up chemistries             -MBS --diet initiated D2/thins  10/12-decided to keep cortrak in for now, decreased HS TF  -pt appears to have eaten a little more this morning  -discussed PO intake with pt. Demonstrated SOME insight  -BMET WNL  -added megace to help stimulate appetite on  10/12  -10/20-ate well again for second consecutive day   -will dc cortrak   -add IVF at HS   -he's not on many medications fortunately    -needs risperdal for behavior--use disintegrating form -check BMET 10/21  10/22- BUN up to 32- will increase night time IVFs to 75 cc /hour for 12 hours/night 10/24 BUN 20 and Cr 1.03 stable, continue to monitor   8: Right distal clavicle and humerus fracture:    -10/25 Xrays reviewed by ortho today, continue sling and NWB RUE recommended.  F/u Dr. Marcelino Scot 3-4 weeks  18: Elevated transaminases: trending downward  -AST 28 and ALT 34 10/6, improved 19:  ?aspiration pneumonia w/x hx of staph and pseudomonas - vancomycin and cefepime total of 7 more days ended 10/5             -10/18 WBC 8/5  -10/25 WBC stable at 8/8 yesterday  20: SVT/sinus tach intermittently: monitor 21: Mediastinal hematoma: Echo WNL; Hgb stable 22: Ankle edema and ecchymosis: x-rays negative for osseous abnormality           Continue TEDS, elevation in bed 23. Loose stools improved. Had been constipated.   -10/24 LBM yesterday, type 6, having regular Bms, continue miralax for now 24. Normocytic anemia/ABLA: likely related to hospitalization and chronic disease  -no gross blood loss  continue b complex and iron  -10/18 9.1  -10/24 HGB up to 9.7 25. Hypoglycemia:     -he's not diabetic. Dc'ed cbg checks.  26. Hypokalemia  -K+ 3.4, order KCL supplement    LOS: 28 days A FACE TO FACE EVALUATION WAS PERFORMED  Jennye Boroughs 02/16/2022, 8:25 AM

## 2022-02-16 NOTE — Progress Notes (Signed)
Occupational Therapy Session Note  Patient Details  Name: Dominic Smith MRN: [id number] Date of Birth: July 22, 1940  Today's Date: 02/16/2022 OT Individual Time: 0702-0715 OT Individual Time Calculation (min): 13 min    Short Term Goals: Week 1:  OT Short Term Goal 1 (Week 1): Pt will consistently transfer to toilet with 1 caregiver OT Short Term Goal 1 - Progress (Week 1): Met OT Short Term Goal 2 (Week 1): Pt will complete 1/4 steps of donning shirt OT Short Term Goal 2 - Progress (Week 1): Progressing toward goal OT Short Term Goal 3 (Week 1): Pt will complete oral care with MOD A OT Short Term Goal 3 - Progress (Week 1): Progressing toward goal OT Short Term Goal 4 (Week 1): Pt will sit to stand with MOD A or less OT Short Term Goal 4 - Progress (Week 1): Met  Skilled Therapeutic Interventions/Progress Updates:     Pt received in bed asleep but easily aroused. Pt horizontal in enclosure bed. Pt requires total A to reposition in the bed d/t inability to process commands. Pt stating that he did not want breakfast. Pt agreeable to stretching BLE. All don to decrease risk of constracture in hip flexors, hamtrings and calves d/t pt keeping BLE bent in bed 90% of the time. Pt tolerated slight overpressure to improve ROM/stretch. Decreased knee extension noted R>L. Pt left at end of session in enclosure bed secured with call light in reach and all needs met   Therapy Documentation Precautions:  Precautions Precautions: Fall, Cervical Precaution Booklet Issued: No Precaution Comments: cortrak, bilateral soft mitts Required Braces or Orthoses: Sling, Cervical Brace Cervical Brace: At all times, Hard collar Restrictions Weight Bearing Restrictions: Yes RUE Weight Bearing: Non weight bearing Other Position/Activity Restrictions: in sling, nonoperative mgmt per ortho 9/18 General:    Therapy/Group: Individual Therapy  [name] 02/16/2022, 6:56 AM

## 2022-02-16 NOTE — Progress Notes (Signed)
Physical Therapy Session Note  Patient Details  Name: Dominic Smith MRN: [id number] Date of Birth: 03/23/1941  Today's Date: 02/16/2022 PT Individual Time: 1405-1500   AND 55 min  Short Term Goals:  Week 4:  PT Short Term Goal 1 (Week 4): Patient will perform bed mobility with min A consistently. PT Short Term Goal 2 (Week 4): Patient will perform basic transfers with min A >75% of the time. PT Short Term Goal 3 (Week 4): Patient will ambulate >200 feet with min A using LRAD without additional assist for LOB. PT Short Term Goal 4 (Week 4): Patient will perform dynamic standing balance >2 min with min A.  Skilled Therapeutic Interventions/Progress Updates:   Pt received supine in bed and agreeable to PT. Supine>sit transfer with max assist from PT for  cues to initiate, then total A for sit>supine to EOB. Sitting balance EOB x 30 minutes with supervision assist. PT donned abdominal binder and TED hose on BLE with total Assist. PT provided orientation information to pt with no carry over. Pt oriented to person only. Sit>stand with total A to initiate on 5 attempts then performed with min assist on sixth attempt and mod assist to turn and pivot to WC. Sitting balacen/tolerance in [facility] with VS assessment. Sitting 131/77 HR 76. Standing 116/82. HR 85. Pt then required min assist after 3 additional attempts to perform stand pivot transfer back to bed with LUE supported on PT. Sit>supine completed with total A due to inability to initiate and left supine in bed with call bell in reach and all needs met.        Therapy Documentation Precautions:  Precautions Precautions: Fall, Cervical Precaution Booklet Issued: No Precaution Comments: cortrak, bilateral soft mitts Required Braces or Orthoses: Sling, Cervical Brace Cervical Brace: At all times, Hard collar Restrictions Weight Bearing Restrictions: Yes RUE Weight Bearing: Non weight bearing Other Position/Activity Restrictions: in sling,  nonoperative mgmt per ortho 9/18    Vital Signs: Therapy Vitals Temp: 98.1 F (36.7 C) Temp Source: Oral Pulse Rate: 79 Resp: 15 BP: 121/66 Patient Position (if appropriate): Lying Oxygen Therapy SpO2: 100 % O2 Device: Room Air Pain:  Faces: none.    Therapy/Group: Individual Therapy  Lorie Phenix 02/16/2022, 2:14 PM

## 2022-02-16 NOTE — Progress Notes (Signed)
Patient ID: Dominic Smith, male   DOB: [birth year], 81 y.o.   MRN: [id number]  Shoulder x-rays reviewed, appropriate progression. Continue sling and NWB RUE. F/u ~3-4 weeks with Dr. Marcelino Scot.    Lisette [name], PA-C Orthopedic Surgery [phone]

## 2022-02-17 NOTE — Progress Notes (Signed)
PROGRESS NOTE   Subjective/Complaints:  Pt in canopy bed this AM. Alert and more interactive today. Denies pain. No concerns elicited.    ROS: limited by cognition  Objective:   No results found.  Recent Labs    02/15/22 1034  WBC 8.8  HGB 9.7*  HCT 29.9*  PLT 324       Recent Labs    02/15/22 1034  NA 138  K 3.4*  CL 109  CO2 21*  GLUCOSE 94  BUN 20  CREATININE 1.03  CALCIUM 8.7*       Intake/Output Summary (Last 24 hours) at 02/17/2022 1136 Last data filed at 02/17/2022 1131 Gross per 24 hour  Intake 297 ml  Output 2250 ml  Net -1953 ml       Pressure Injury 01/13/22 Coccyx Mid Unstageable - Full thickness tissue loss in which the base of the injury is covered by slough (yellow, tan, gray, green or brown) and/or eschar (tan, brown or black) in the wound bed. skin is broken with redness at the (Active)  01/13/22 1930  Location: Coccyx  Location Orientation: Mid  Staging: Unstageable - Full thickness tissue loss in which the base of the injury is covered by slough (yellow, tan, gray, green or brown) and/or eschar (tan, brown or black) in the wound bed.  Wound Description (Comments): skin is broken with redness at the base  Present on Admission: Yes (present on admission to rehab 9/27)    Physical Exam: Vital Signs Blood pressure (!) 167/53, pulse 84, temperature 98.4 F (36.9 C), temperature source Oral, resp. rate 16, height 5\' 11"  (1.803 m), weight 57 kg, SpO2 100 %.     General: awake, alert,,sleeping in enclosure bed, NAD HENT: conjugate gaze; oropharynx moist CV: regular rate; no JVD Pulmonary: CTA B/L; no W/R/R GI: soft, NT, ND, (+)BS Psychiatric: calm, pleasant Uro: foley in place, urine remains clear yellow Skin: wounds at knees, both hands, left elbow, right ankle.  Small coccyx wound present, minimal drainage, no signs of infection. Foam dressings at based of collar  Neuro:  Alert,  Speech clear. Follows simple commands. Oriented to self and [place] [employer] today. No oriented to situation or date. Confused.  Moves all 4 limbs spontaneously but difficut to grade.   Musculoskeletal: no focal jt pain, nl prom. Right arm in sling    Assessment/Plan: 1. Functional deficits which require 3+ hours per day of interdisciplinary therapy in a comprehensive inpatient rehab setting. Physiatrist is providing close team supervision and 24 hour management of active medical problems listed below. Physiatrist and rehab team continue to assess barriers to discharge/monitor patient progress toward functional and medical goals  Care Tool:  Bathing        Body parts bathed by helper: Right arm, Left arm, Chest, Abdomen, Front perineal area, Buttocks, Right upper leg, Left upper leg, Right lower leg, Left lower leg, Face     Bathing assist Assist Level: 2 Helpers     Upper Body Dressing/Undressing Upper body dressing   What is the patient wearing?: Pull over shirt    Upper body assist Assist Level: Dependent - Patient 0%    Lower Body Dressing/Undressing  Lower body dressing            Lower body assist Assist for lower body dressing: 2 Helpers     Toileting Toileting    Toileting assist Assist for toileting: Moderate Assistance - Patient 50 - 74%     Transfers Chair/bed transfer  Transfers assist     Chair/bed transfer assist level: Moderate Assistance - Patient 50 - 74%     Locomotion Ambulation   Ambulation assist   Ambulation activity did not occur: Safety/medical concerns  Assist level: Moderate Assistance - Patient 50 - 74% Assistive device: Hand held assist Max distance: 180   Walk 10 feet activity   Assist  Walk 10 feet activity did not occur: Safety/medical concerns  Assist level: Moderate Assistance - Patient - 50 - 74% Assistive device: Hand held assist   Walk 50 feet activity   Assist Walk 50 feet with 2 turns  activity did not occur: Safety/medical concerns  Assist level: Moderate Assistance - Patient - 50 - 74% Assistive device: Hand held assist    Walk 150 feet activity   Assist Walk 150 feet activity did not occur: Safety/medical concerns  Assist level: Moderate Assistance - Patient - 50 - 74% Assistive device: Hand held assist    Walk 10 feet on uneven surface  activity   Assist Walk 10 feet on uneven surfaces activity did not occur: Safety/medical concerns         Wheelchair     Assist Is the patient using a wheelchair?: Yes Type of Wheelchair: Manual    Wheelchair assist level: Dependent - Patient 0% (TIS)      Wheelchair 50 feet with 2 turns activity    Assist    Wheelchair 50 feet with 2 turns activity did not occur: Safety/medical concerns       Wheelchair 150 feet activity     Assist  Wheelchair 150 feet activity did not occur: Safety/medical concerns       Blood pressure (!) 167/53, pulse 84, temperature 98.4 F (36.9 C), temperature source Oral, resp. rate 16, height 5\' 11"  (1.803 m), weight 57 kg, SpO2 100 %.  Medical Problem List and Plan: 1. 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   -minimal/slow progress d/t waxing and waning behavior which is due to premorbid dementia, age, and TBI  Con't CIR- PT, OT and SLP- looking at SNF  -Adjusted to QD 2.  Antithrombotics: -DVT/anticoagulation:  Pharmaceutical: Lovenox 30 mg BID             -antiplatelet therapy: aspirin 81 mg daily 3. Pain Management: continue Tylenol per tube scheduled,\; oxycodone, Robaxin prn  10/23 Pt denies pain this AM 4. Mood/Behavior/Sleep: LCSW to evaluate and provide emotional support             -antipsychotic agents: risperdal             -probably mild baseline dementia (ex-wife confirmed this)  -continue sleep chart   -10/20 did better in enclosure bed  --continue   -continue risperdal at lunch and in evening   -minimize any other neurosedating meds   -remove NGT  10/26 decreased risperdal to 0.5mg  HS yesterday, will stop noon dose, continue ritalin to limit sedating medications 5. Neuropsych/cognition: This patient is not capable of making decisions on his own behalf.  6. Skin/Wound Care: routine skin care checks             -monitor scalp lac/skin abrasions             -appreciate WOC recs for unstageable sacral/coccyx pressure wound   -continue medihoney/foam dressing 7. Fluids/Electrolytes/Nutrition/dysphagia: Strict Is and Os and follow-up chemistries             -MBS --diet initiated D2/thins  10/12-decided to keep cortrak in for now, decreased HS TF  -pt appears to have eaten a little more this morning  -discussed PO intake with pt. Demonstrated SOME insight  -BMET WNL  -added megace to help stimulate appetite on 10/12  -10/20-ate well again for second consecutive day   -will dc cortrak   -add IVF at HS   -he's not on many medications fortunately    -needs risperdal for behavior--use disintegrating form -check BMET 10/21  10/22- BUN up to 32- will increase night time IVFs to 75 cc /hour for 12 hours/night 10/24 BUN 20 and Cr 1.03 stable, continue to monitor   8: Right distal clavicle and humerus fracture:    -10/25 Xrays reviewed by ortho today, continue sling and NWB RUE recommended.  F/u Dr. Marcelino Scot 3-4 weeks  18: Elevated transaminases: trending downward  -AST 28 and ALT 34 10/6, improved 19:  ?aspiration pneumonia w/x hx of staph and pseudomonas - vancomycin and cefepime total of 7 more days ended 10/5  -10/25 WBC stable at 8.8 yesterday 20: SVT/sinus tach intermittently: monitor 21: Mediastinal hematoma: Echo WNL; Hgb stable 22: Ankle edema and ecchymosis: x-rays negative for osseous abnormality           Continue TEDS, elevation in bed 23. Loose stools improved. Had been constipated.   -10/26 LBM  yesterday, fairly regular Bms, continue to monitor 24. Normocytic anemia/ABLA: likely related to hospitalization and chronic disease  -no gross blood loss  continue b complex and iron  -10/24 HGB up to 9.7 25. Hypoglycemia:     -he's not diabetic. Dc'ed cbg checks.  26. Hypokalemia  -K+ 3.4, order KCL supplement  -Recheck tomorrow    LOS: 29 days A FACE TO FACE EVALUATION WAS PERFORMED  Jennye Boroughs 02/17/2022, 11:36 AM

## 2022-02-17 NOTE — Progress Notes (Signed)
Speech Language Pathology Weekly Progress and Session Note  Patient Details  Name: Dominic Smith MRN: [id number] Date of Birth: [birth year]  Beginning of progress report period: February 10, 2022 End of progress report period: February 17, 2022  Today's Date: 02/17/2022 SLP Co-Treatment Time: 1000-1030 SLP Co-Treatment Time Calculation (min): 30 min  Short Term Goals: Week 4: SLP Short Term Goal 1 (Week 4): Patient will consume current diet with minimal overt s/s of aspiration and Min verbal cues for use of swallowing compensatory strategies. SLP Short Term Goal 1 - Progress (Week 4): Not met SLP Short Term Goal 2 (Week 4): Patient will orient to place and month with Mod A multimodal cues. SLP Short Term Goal 2 - Progress (Week 4): Not met SLP Short Term Goal 3 (Week 4): Patient will follow commands throughout functional tasks in 75% of opportunities with Max multimodal cues. SLP Short Term Goal 3 - Progress (Week 4): Met SLP Short Term Goal 4 (Week 4): Patient will demonstate sustained attenton to functional tasks for 5 minutes with Max A multimodal cues for redirection. SLP Short Term Goal 4 - Progress (Week 4): Met    New Short Term Goals: Week 5: SLP Short Term Goal 1 (Week 5): Patient will consume current diet with minimal overt s/s of aspiration and Min verbal cues for use of swallowing compensatory strategies. SLP Short Term Goal 2 (Week 5): Patient will orient to place,  month and situation with Max A multimodal cues. SLP Short Term Goal 3 (Week 5): Patient will demonstate sustained attenton to functional tasks for 10 minutes with Max A multimodal cues for redirection.  Weekly Progress Updates: Patient has made slow and inconsistent gains and has met 2 of 4 STGs this reporting period. Currently, patient demonstrates behaviors consistent with a Rancho Level V and requires overall Max-Total A multimodal cues to complete functional and familiar tasks safely in regards to sustained  attention, initiation, and orientation. Function is also impacted by fatigue, language of confusion, and confabulation.. Patient is consuming Dys. 2 textures with thin liquids with minimal overt s/s of aspiration with Mod verbal cues for use of swallowing compensatory strategies/attention to bolus and Max encouragement for PO intake. Recommend patient continue current diet. Patient and family education ongoing. Patient would benefit from continued skilled SLP intervention to maximize his swallowing and cognitive functioning prior to discharge.     Intensity: Minumum of 1-2 x/day, 30 to 90 minutes Frequency: 3 to 5 out of 7 days Duration/Length of Stay: TBD due to SNF placement Treatment/Interventions: Cognitive remediation/compensation;Dysphagia/aspiration precaution training;Speech/Language facilitation;Internal/external aids;Cueing hierarchy;Environmental controls;Therapeutic Activities;Functional tasks;Patient/family education   Daily Session  Skilled Therapeutic Interventions:  Skilled co-treatment session focused on cognitive and dysphagia goals. Upon arrival, patient was asleep in bed and difficult to rouse. OT joined session with focus on maximizing positioning to improve arousal. TEDs were donned Total A and patient sat EOB with assistance from OT. Patient declined all snacks and liquids despite Max encouragement. Patient essentially kept his eyes closed and initially verbalized "no" but then became unresponsive to questions. Patient eventually consumed thin liquids via straw without overt s/s of aspiration. Patient required total A to return to supine. Patient left secure in enclosure bed with mitts on and all needs within reach. Continue with current plan of care.     Pain No/Denies Pain   Therapy/Group: Individual Therapy  Aileena Iglesia 02/17/2022, 7:03 AM

## 2022-02-17 NOTE — Progress Notes (Signed)
Occupational Therapy Weekly Progress Note  Patient Details  Name: Dominic Smith MRN: [id number] Date of Birth: [birth year]  Beginning of progress report period: February 10, 2022 End of progress report period: February 17, 2022  Today's Date: 02/17/2022 OT Individual Time: 1030-1045 OT Individual Time Calculation (min): 15 min    Patient has met 1 of 3 short term goals.  Pt has continued to make slow progress towards OT goals waxing and waning in orientaiton, basic cognition, and functional performance of BADLs. When pt is attentive, alert and less confused can perform BADLs at MOD A level, however if the pt is lethargic and confused can require max-total A for all components of BADL. Pt schedule has been reduced to QD d/t limited participation in skilled tx at this time.   Patient continues to demonstrate the following deficits: muscle weakness, decreased cardiorespiratoy endurance, impaired timing and sequencing, abnormal tone, unbalanced muscle activation, motor apraxia, ataxia, and decreased coordination, decreased visual perceptual skills, decreased motor planning, decreased initiation, decreased attention, decreased awareness, decreased problem solving, decreased safety awareness, decreased memory, delayed processing, and demonstrates behaviors consistent with Rancho Level 5, and decreased sitting balance, decreased standing balance, decreased postural control, decreased balance strategies, and difficulty maintaining precautions and therefore will continue to benefit from skilled OT intervention to enhance overall performance with BADL and Reduce care partner burden.  Patient progressing toward long term goals..  Continue plan of care.  OT Short Term Goals Week 4:  OT Short Term Goal 1 (Week 4): Pt will complete toilet transfer with MIN A consistently OT Short Term Goal 1 - Progress (Week 4): Progressing toward goal OT Short Term Goal 2 (Week 4): Pt will complete 1/3 steps of LB  dressing OT Short Term Goal 2 - Progress (Week 4): Met OT Short Term Goal 3 (Week 4): Pt will be oriented to place 25% of encounters OT Short Term Goal 3 - Progress (Week 4): Progressing toward goal Week 5:  OT Short Term Goal 1 (Week 5): STG=LTG d/t ELOS  Skilled Therapeutic Interventions/Progress Updates:    Pt seen as tx with ST see note. Focus of session with ST on arousal, self feeding, dressing and functional mobility. This clinician facilitated bed mobility with increased time and decreased participation by pt. Towards end of session resistive of all ideas and movements stating, "no" to everything even if it was to confirm what he wanted. Pt initially needing MODA for sitting blance at EOB, but then improving to supervision. MAX A to trasfers into chair after total A to don teds for BP management. Pt returned to bed at end of session with enclusure zipped up, mitts donned and call light tin reach. Pt sleeping soundly  Therapy Documentation Precautions:  Precautions Precautions: Fall, Cervical Precaution Booklet Issued: No Precaution Comments: cortrak, bilateral soft mitts Required Braces or Orthoses: Sling, Cervical Brace Cervical Brace: At all times, Hard collar Restrictions Weight Bearing Restrictions: Yes RUE Weight Bearing: Weight bearing as tolerated Other Position/Activity Restrictions: in sling, nonoperative mgmt per ortho 9/18 General:     Therapy/Group: Individual Therapy  [name] 02/17/2022, 7:00 AM

## 2022-02-17 NOTE — Progress Notes (Signed)
Physical Therapy Session Note  Patient Details  Name: Dominic Smith MRN: [id number] Date of Birth: 1941-03-18  Today's Date: 02/17/2022 PT Individual Time: 0258-5277 PT Individual Time Calculation (min): 30 min   Short Term Goals: Week 4:  PT Short Term Goal 1 (Week 4): Patient will perform bed mobility with min A consistently. PT Short Term Goal 2 (Week 4): Patient will perform basic transfers with min A >75% of the time. PT Short Term Goal 3 (Week 4): Patient will ambulate >200 feet with min A using LRAD without additional assist for LOB. PT Short Term Goal 4 (Week 4): Patient will perform dynamic standing balance >2 min with min A.  Skilled Therapeutic Interventions/Progress Updates: Pt presents supine in enclosed bed asleep, but aroused to voice stim.  Pt continues to perseverate on "a tea that wecould sell out of."  Pt allows donning of wraps to B LE s but does not assist w/ lifting legs as done previously.  Pt transfers sup to sit w/ max A although agreeable to transfer, no initiation.  Pt sits EOB w/ SBA for donning of abd binder and then transfers sit to stand w/ mod A and steps to [facility] w/ mod to max A and weight shift.  Pt performed x 2 w/ seated rest breaks between.  Pt returned to supine w/ max A although states will lie back down.  Pt left in enclosed bed w/ all openings latched and all needs in reach.  Pt quickly falls asleep before PT has left room.     Therapy Documentation Precautions:  Precautions Precautions: Fall, Cervical Precaution Booklet Issued: No Precaution Comments: cortrak, bilateral soft mitts Required Braces or Orthoses: Sling, Cervical Brace Cervical Brace: At all times, Hard collar Restrictions Weight Bearing Restrictions: Yes RUE Weight Bearing: Weight bearing as tolerated Other Position/Activity Restrictions: in sling, nonoperative mgmt per ortho 9/18 General:   Vital Signs: Therapy Vitals Temp: 97.8 F (36.6 C) Temp Source: Oral Pulse Rate:  79 Resp: 18 BP: (!) 154/95 Patient Position (if appropriate): Lying Oxygen Therapy SpO2: 100 % O2 Device: Room Air Pain:appears pain-free.      Therapy/Group: Individual Therapy  [name] 02/17/2022, 1:39 PM

## 2022-02-17 NOTE — Plan of Care (Signed)
Goals downgraded due to slow and inconsistent progress  Problem: RH Swallowing Goal: LTG Patient will consume least restrictive diet using compensatory strategies with assistance (SLP) Description: LTG:  Patient will consume least restrictive diet using compensatory strategies with assistance (SLP) Flowsheets (Taken 02/17/2022 0657) LTG: Pt Patient will consume least restrictive diet using compensatory strategies with assistance of (SLP): Moderate Assistance - Patient 50 - 74% Goal: LTG Patient will participate in dysphagia therapy to increase swallow function with assistance (SLP) Description: LTG:  Patient will participate in dysphagia therapy to increase swallow function with assistance (SLP) Flowsheets (Taken 02/17/2022 0657) LTG: Pt will participate in dysphagia therapy to increase swallow function with assistance of (SLP): Moderate Assistance - Patient 50 - 74%   Problem: RH Cognition - SLP Goal: RH LTG Patient will demonstrate orientation with cues Description:  LTG:  Patient will demonstrate orientation to person/place/time/situation with cues (SLP)   Flowsheets (Taken 02/17/2022 0657) LTG Patient will demonstrate orientation to:  Place  Time  Situation LTG: Patient will demonstrate orientation using cueing (SLP): Maximal Assistance - Patient 25 - 49%   Problem: RH Comprehension Communication Goal: LTG Patient will comprehend basic/complex auditory (SLP) Description: LTG: Patient will comprehend basic/complex auditory information with cues (SLP). Flowsheets (Taken 02/17/2022 [phone]) LTG: Patient will comprehend auditory information with cueing (SLP): Minimal Assistance - Patient > 75%   Problem: RH Expression Communication Goal: LTG Patient will verbally express basic/complex needs(SLP) Description: LTG:  Patient will verbally express basic/complex needs, wants or ideas with cues  (SLP) Flowsheets (Taken 02/17/2022 0657) LTG: Patient will verbally express basic/complex needs,  wants or ideas (SLP): Minimal Assistance - Patient > 75%   Problem: RH Attention Goal: LTG Patient will demonstrate this level of attention during functional activites (SLP) Description: LTG:  Patient will will demonstrate this level of attention during functional activites (SLP) Flowsheets (Taken 02/17/2022 [phone]) Patient will demonstrate during cognitive/linguistic activities the attention type of: Sustained LTG: Patient will demonstrate this level of attention during cognitive/linguistic activities with assistance of (SLP): Maximal Assistance - Patient 25 - 49% Number of minutes patient will demonstrate attention during cognitive/linguistic activities: 15   Goals discharged due to slow and inconsistent progress  Problem: RH Problem Solving Goal: LTG Patient will demonstrate problem solving for (SLP) Description: LTG:  Patient will demonstrate problem solving for basic/complex daily situations with cues  (SLP) Outcome: Not Applicable   Problem: RH Memory Goal: LTG Patient will use memory compensatory aids to (SLP) Description: LTG:  Patient will use memory compensatory aids to recall biographical/new, daily complex information with cues (SLP) Outcome: Not Applicable   Problem: RH Awareness Goal: LTG: Patient will demonstrate awareness during functional activites type of (SLP) Description: LTG: Patient will demonstrate awareness during functional activites type of (SLP) Outcome: Not Applicable

## 2022-02-18 DIAGNOSIS — R41840 Attention and concentration deficit: Secondary | ICD-10-CM

## 2022-02-18 LAB — BASIC METABOLIC PANEL
Anion gap: 3 — ABNORMAL LOW (ref 5–15)
BUN: 17 mg/dL (ref 8–23)
CO2: 23 mmol/L (ref 22–32)
Calcium: 8.7 mg/dL — ABNORMAL LOW (ref 8.9–10.3)
Chloride: 112 mmol/L — ABNORMAL HIGH (ref 98–111)
Creatinine, Ser: 1.19 mg/dL (ref 0.61–1.24)
GFR, Estimated: >60 mL/min (ref >60)
Glucose, Bld: 114 mg/dL — ABNORMAL HIGH (ref 70–99)
Potassium: 3.8 mmol/L (ref 3.5–5.1)
Sodium: 138 mmol/L (ref 135–145)

## 2022-02-18 LAB — CBC
HCT: 31.7 % — ABNORMAL LOW (ref 39.0–52.0)
Hemoglobin: 10.2 g/dL — ABNORMAL LOW (ref 13.0–17.0)
MCH: 28.6 pg (ref 26.0–34.0)
MCHC: 32.2 g/dL (ref 30.0–36.0)
MCV: 88.8 fL (ref 80.0–100.0)
Platelets: 278 10*3/uL (ref 150–400)
RBC: 3.57 MIL/uL — ABNORMAL LOW (ref 4.22–5.81)
RDW: 13.7 % (ref 11.5–15.5)
WBC: 8.3 10*3/uL (ref 4.0–10.5)
nRBC: 0 % (ref 0.0–0.2)

## 2022-02-18 LAB — POTASSIUM: Potassium: 3.9 mmol/L (ref 3.5–5.1)

## 2022-02-18 MED ORDER — METHYLPHENIDATE HCL 5 MG PO TABS
15.0000 mg | ORAL_TABLET | Freq: Every day | ORAL | Status: DC
  Administered 2022-02-19 – 2022-03-08 (×18): 15 mg via ORAL
  Filled 2022-02-18 (×18): qty 3

## 2022-02-18 NOTE — Progress Notes (Signed)
Speech Language Pathology TBI Note  Patient Details  Name: Dominic Smith MRN: [id number] Date of Birth: 08/08/1940  Today's Date: 02/18/2022 SLP Individual Time: 1430-1505 SLP Individual Time Calculation (min): 35 min  Short Term Goals:  Week 5: SLP Short Term Goal 1 (Week 5): Patient will consume current diet with minimal overt s/s of aspiration and Min verbal cues for use of swallowing compensatory strategies. SLP Short Term Goal 2 (Week 5): Patient will orient to place,  month and situation with Max A multimodal cues. SLP Short Term Goal 3 (Week 5): Patient will demonstate sustained attenton to functional tasks for 10 minutes with Max A multimodal cues for redirection.     Skilled Therapeutic Interventions: Skilled treatment session focused on cognitive and dysphagia goals. Upon arrival, patient was awake while upright in the enclosure bed. Patient sat EOB with total +2 assist and initially required Mod A for sitting balance. However, patient eventually supervision for sitting EOB while consuming a snack of Dys. 1 textures with thin liquids. Patient consumed 2 yogurts without overt s/s of aspiration and required Min verbal cues for sustained attention to task. Patient with intermittent language of confusion but remained calm and cooperative throughout. Patient handed off to OT. Continue with current plan of care.      Pain Pain Assessment Pain Scale: 0-10 Pain Score: 0-No pain  Agitated Behavior Scale: TBI  ABS discontinued d/t ABS score less than 20 for the last three days or no behaviors present     Therapy/Group: Individual Therapy  Bryanah Sidell, [place] 02/18/2022, 3:17 PM

## 2022-02-18 NOTE — Progress Notes (Signed)
Occupational Therapy Session Note  Patient Details  Name: Dominic Smith MRN: [id number] Date of Birth: 1940/08/15  Today's Date: 02/18/2022 OT Individual Time: 1500-1530 OT Individual Time Calculation (min): 30 min    Short Term Goals: Week 4:  OT Short Term Goal 1 (Week 4): Pt will complete toilet transfer with MIN A consistently OT Short Term Goal 1 - Progress (Week 4): Progressing toward goal OT Short Term Goal 2 (Week 4): Pt will complete 1/3 steps of LB dressing OT Short Term Goal 2 - Progress (Week 4): Met OT Short Term Goal 3 (Week 4): Pt will be oriented to place 25% of encounters OT Short Term Goal 3 - Progress (Week 4): Progressing toward goal Week 5:  OT Short Term Goal 1 (Week 5): STG=LTG d/t ELOS  Skilled Therapeutic Interventions/Progress Updates:     Pt seen for skilled OT session with a focus on dressing and functional mobility. Rn assisted during beginning of session to pause IV for UB dressing. Pt sititng EOB with close supervision and receptive of changing into clean clothes. Pt attempted to put clean shirt on over shirt he is currently wearing requiring moderate cues to doff clothing first. Pt unable to follow verbal cues to take off his shirt reaching for therapist's gown and complimenting it continuously. Therapist initiated taking Pt's L arm out of shirt and bring it over his head with Pt assisting go doff the R arm. Multimodal cues provided to Pt. to don shirt with Pt able to pull shirt over his head following therapist initiating donning R/L arms. Abdominal binder donned on Pt with total A. Pt weaved bilateral legs into pants with increased time to respond to verbal cues and to attend to task. Pt pulled pants to his knees and then transitioned to laying on his side in bed. Pt provided with moderate multimodal cues to sit back up and finish pulling up pants, but pt was no longer attending to task. Therapist provided Max A to pull pants up the Pt's waist while he was  laying in bed. Pt was left resting in posey bed with safety mitts donned, call bell in reach, and all needs met.    Therapy Documentation Precautions:  Precautions Precautions: Fall, Cervical Precaution Booklet Issued: No Precaution Comments: cortrak, bilateral soft mitts Required Braces or Orthoses: Sling, Cervical Brace Cervical Brace: At all times, Hard collar Restrictions Weight Bearing Restrictions: Yes RUE Weight Bearing: Weight bearing as tolerated Other Position/Activity Restrictions: in sling, nonoperative mgmt per ortho 9/18 General:   Vital Signs: Therapy Vitals Temp: 97.6 F (36.4 C) Temp Source: Oral Pulse Rate: 75 Resp: 18 BP: (Abnormal) 174/76 Patient Position (if appropriate): Lying Oxygen Therapy SpO2: 100 % O2 Device: Room Air Pain: Pain Assessment Pain Scale: 0-10 Pain Score: 0-No pain ADL: ADL Eating: NPO Grooming: Dependent Where Assessed-Grooming: Wheelchair Upper Body Bathing: Dependent Where Assessed-Upper Body Bathing: Wheelchair Lower Body Bathing: Dependent Where Assessed-Lower Body Bathing: Wheelchair Upper Body Dressing: Dependent Where Assessed-Upper Body Dressing: Wheelchair Lower Body Dressing: Dependent Where Assessed-Lower Body Dressing: Software engineer Transfer: Dependent Toilet Transfer Method: Ambulating (MIn-MOD A for power up)   Therapy/Group: Individual Therapy  Janey Genta 02/18/2022, 4:48 PM

## 2022-02-18 NOTE — Progress Notes (Signed)
Physical Therapy Weekly Progress Note  Patient Details  Name: Dominic Smith MRN: [id number] Date of Birth: 1941-04-17  Beginning of progress report period: February 11, 2022 End of progress report period: February 18, 2022  {CHL IP REHAB PT TIME CALCULATION:304800500}  Patient has met {number 1-5:22450} of {number 1-5:20334} short term goals.  ***  Patient continues to demonstrate the following deficits {impairments:3041632} and therefore will continue to benefit from skilled PT intervention to increase functional independence with mobility.  Patient {LTG progression:3041653}.  {plan of JHER:7408144}  PT Short Term Goals {YJE:5631497}  Skilled Therapeutic Interventions/Progress Updates:      Therapy Documentation Precautions:  Precautions Precautions: Fall, Cervical Precaution Booklet Issued: No Precaution Comments: cortrak, bilateral soft mitts Required Braces or Orthoses: Sling, Cervical Brace Cervical Brace: At all times, Hard collar Restrictions Weight Bearing Restrictions: Yes RUE Weight Bearing: Weight bearing as tolerated Other Position/Activity Restrictions: in sling, nonoperative mgmt per ortho 9/18 General:   Vital Signs:  Pain:   Vision/Perception     Mobility:   Locomotion :    Trunk/Postural Assessment :    Balance:   Exercises:   Other Treatments:     Therapy/Group: Individual Therapy  Alger Simons 02/18/2022, 10:26 AM

## 2022-02-18 NOTE — Progress Notes (Addendum)
PROGRESS NOTE   Subjective/Complaints:  Working with therapy this AM at bedsite, he said he wanted to lay back down in bed. No concerns elicited.   LBM 10/25   ROS: limited by cognition  Objective:   No results found.  No results for input(s): "WBC", "HGB", "HCT", "PLT" in the last 72 hours.     No results for input(s): "NA", "K", "CL", "CO2", "GLUCOSE", "BUN", "CREATININE", "CALCIUM" in the last 72 hours.     Intake/Output Summary (Last 24 hours) at 02/18/2022 1043 Last data filed at 02/18/2022 0530 Gross per 24 hour  Intake 150 ml  Output 2750 ml  Net -2600 ml       Pressure Injury 01/13/22 Coccyx Mid Unstageable - Full thickness tissue loss in which the base of the injury is covered by slough (yellow, tan, gray, green or brown) and/or eschar (tan, brown or black) in the wound bed. skin is broken with redness at the (Active)  01/13/22 1930  Location: Coccyx  Location Orientation: Mid  Staging: Unstageable - Full thickness tissue loss in which the base of the injury is covered by slough (yellow, tan, gray, green or brown) and/or eschar (tan, brown or black) in the wound bed.  Wound Description (Comments): skin is broken with redness at the base  Present on Admission: Yes (present on admission to rehab 9/27)    Physical Exam: Vital Signs Blood pressure (!) 156/71, pulse 81, temperature 98.4 F (36.9 C), temperature source Oral, resp. rate 16, height 5\' 11"  (1.803 m), weight 57 kg, SpO2 100 %.     General: awake, in enclosure bed, NAD HENT: conjugate gaze; oropharynx moist CV: regular rate; no JVD Pulmonary: CTA B/L; no W/R/R GI: soft, NT, ND, (+)BS Abdominal binder in place Psychiatric: calm, pleasant Uro: foley in place, urine remains clear yellow Skin: wounds at knees, both hands, left elbow, right ankle.  Small coccyx wound present, minimal drainage, no signs of infection. Foam dressings at based  of collar  Neuro: Alert,  Speech clear. Follows simple commands intermittently. Oriented to self only today. Not oriented to situation or date. Confused.  Moves all 4 limbs spontaneously but difficut to grade.  Appears to fatigue easy Musculoskeletal: no focal jt pain, nl prom. Right arm in sling    Assessment/Plan: 1. Functional deficits which require 3+ hours per day of interdisciplinary therapy in a comprehensive inpatient rehab setting. Physiatrist is providing close team supervision and 24 hour management of active medical problems listed below. Physiatrist and rehab team continue to assess barriers to discharge/monitor patient progress toward functional and medical goals  Care Tool:  Bathing        Body parts bathed by helper: Right arm, Left arm, Chest, Abdomen, Front perineal area, Buttocks, Right upper leg, Left upper leg, Right lower leg, Left lower leg, Face     Bathing assist Assist Level: 2 Helpers     Upper Body Dressing/Undressing Upper body dressing   What is the patient wearing?: Pull over shirt    Upper body assist Assist Level: Dependent - Patient 0%    Lower Body Dressing/Undressing Lower body dressing  Lower body assist Assist for lower body dressing: 2 Helpers     Toileting Toileting    Toileting assist Assist for toileting: Moderate Assistance - Patient 50 - 74%     Transfers Chair/bed transfer  Transfers assist     Chair/bed transfer assist level: Moderate Assistance - Patient 50 - 74%     Locomotion Ambulation   Ambulation assist   Ambulation activity did not occur: Safety/medical concerns  Assist level: Moderate Assistance - Patient 50 - 74% Assistive device: Hand held assist Max distance: 180   Walk 10 feet activity   Assist  Walk 10 feet activity did not occur: Safety/medical concerns  Assist level: Moderate Assistance - Patient - 50 - 74% Assistive device: Hand held assist   Walk 50 feet  activity   Assist Walk 50 feet with 2 turns activity did not occur: Safety/medical concerns  Assist level: Moderate Assistance - Patient - 50 - 74% Assistive device: Hand held assist    Walk 150 feet activity   Assist Walk 150 feet activity did not occur: Safety/medical concerns  Assist level: Moderate Assistance - Patient - 50 - 74% Assistive device: Hand held assist    Walk 10 feet on uneven surface  activity   Assist Walk 10 feet on uneven surfaces activity did not occur: Safety/medical concerns         Wheelchair     Assist Is the patient using a wheelchair?: Yes Type of Wheelchair: Manual    Wheelchair assist level: Dependent - Patient 0% (TIS)      Wheelchair 50 feet with 2 turns activity    Assist    Wheelchair 50 feet with 2 turns activity did not occur: Safety/medical concerns       Wheelchair 150 feet activity     Assist  Wheelchair 150 feet activity did not occur: Safety/medical concerns       Blood pressure (!) 156/71, pulse 81, temperature 98.4 F (36.9 C), temperature source Oral, resp. rate 16, height 5\' 11"  (1.803 m), weight 57 kg, SpO2 100 %.  Medical Problem List and Plan: 1. 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   -minimal/slow progress d/t waxing and waning behavior which is due to premorbid dementia, age, and TBI  -Behavior constant Rachos level V  Con't CIR- PT, OT and SLP- looking at SNF  -Adjusted to QD  2.  Antithrombotics: -DVT/anticoagulation:  Pharmaceutical: Lovenox 30 mg BID             -antiplatelet therapy: aspirin 81 mg daily 3. Pain Management: continue Tylenol per tube scheduled,\; oxycodone, Robaxin prn  10/23 Pt denies pain this AM 4. Mood/Behavior/Sleep: LCSW to evaluate and provide emotional support             -antipsychotic agents: risperdal             -probably mild baseline dementia (ex-wife  confirmed this)  -continue sleep chart   -10/20 did better in enclosure bed --continue   -continue risperdal at lunch and in evening   -minimize any other neurosedating meds   -remove NGT  10/26 decreased risperdal to 0.5mg  HS yesterday, will stop noon dose  10/27 will increase ritalin dose to 15mg  BID 5. Neuropsych/cognition: This patient is not capable of making decisions on his own behalf.  6. Skin/Wound Care: routine skin care checks             -monitor scalp lac/skin abrasions             -appreciate WOC recs for unstageable sacral/coccyx pressure wound   -continue medihoney/foam dressing 7. Fluids/Electrolytes/Nutrition/dysphagia: Strict Is and Os and follow-up chemistries             -MBS --diet initiated D2/thins  10/12-decided to keep cortrak in for now, decreased HS TF  -pt appears to have eaten a little more this morning  -discussed PO intake with pt. Demonstrated SOME insight  -BMET WNL  -added megace to help stimulate appetite on 10/12  -10/20-ate well again for second consecutive day   -will dc cortrak   -add IVF at HS   -he's not on many medications fortunately    -needs risperdal for behavior--use disintegrating form -check BMET 10/21  10/22- BUN up to 32- will increase night time IVFs to 75 cc /hour for 12 hours/night 10/24 BUN 20 and Cr 1.03 stable, continue to monitor  Recheck labs today  8: Right distal clavicle and humerus fracture:    -10/25 Xrays reviewed by ortho today, continue sling and NWB RUE recommended.  F/u Dr. Marcelino Scot 3-4 weeks  18: Elevated transaminases: trending downward  -AST 28 and ALT 34 10/6, improved 19:  ?aspiration pneumonia w/x hx of staph and pseudomonas - vancomycin and cefepime total of 7 more days ended 10/5  -10/25 WBC stable at 8.8 yesterday 20: SVT/sinus tach intermittently: monitor 21: Mediastinal hematoma: Echo WNL; Hgb stable 22: Ankle edema and ecchymosis: x-rays negative for osseous abnormality            Continue TEDS, elevation in bed 23. Loose stools improved. Had been constipated.   -10/26 LBM yesterday, fairly regular Bms, continue to monitor 24. Normocytic anemia/ABLA: likely related to hospitalization and chronic disease  -no gross blood loss  continue b complex and iron  -10/24 HGB up to 9.7  -recheck today 25. Hypoglycemia:     -he's not diabetic. Dc'ed cbg checks.  26. Hypokalemia  -10/27 Improved to 3.9 after supplement    LOS: 30 days A FACE TO [place] 02/18/2022, 10:43 AM

## 2022-02-19 LAB — MRSA NEXT GEN BY PCR, NASAL: MRSA by PCR Next Gen: DETECTED — AB

## 2022-02-19 NOTE — Progress Notes (Signed)
PROGRESS NOTE   Subjective/Complaints:  Pt in enclosure bed, son was visiting earlier and requested grounds pass Pt remains dioriented   ROS: limited by cognition  Objective:   No results found.  Recent Labs    02/18/22 1230  WBC 8.3  HGB 10.2*  HCT 31.7*  PLT 278       Recent Labs    02/18/22 0945 02/18/22 1230  NA  --  138  K 3.9 3.8  CL  --  112*  CO2  --  23  GLUCOSE  --  114*  BUN  --  17  CREATININE  --  1.19  CALCIUM  --  8.7*       Intake/Output Summary (Last 24 hours) at 02/19/2022 1322 Last data filed at 02/19/2022 0730 Gross per 24 hour  Intake 118 ml  Output 2051 ml  Net -1933 ml       Pressure Injury 01/13/22 Coccyx Mid Unstageable - Full thickness tissue loss in which the base of the injury is covered by slough (yellow, tan, gray, green or brown) and/or eschar (tan, brown or black) in the wound bed. skin is broken with redness at the (Active)  01/13/22 1930  Location: Coccyx  Location Orientation: Mid  Staging: Unstageable - Full thickness tissue loss in which the base of the injury is covered by slough (yellow, tan, gray, green or brown) and/or eschar (tan, brown or black) in the wound bed.  Wound Description (Comments): skin is broken with redness at the base  Present on Admission: Yes (present on admission to rehab 9/27)    Physical Exam: Vital Signs Blood pressure (!) 165/93, pulse 78, temperature 98.3 F (36.8 C), temperature source Oral, resp. rate 16, height 5\' 11"  (1.803 m), weight 57 kg, SpO2 100 %.   General: No acute distress Mood and affect are appropriate Heart: Regular rate and rhythm no rubs murmurs or extra sounds Lungs: Clear to auscultation, breathing unlabored, no rales or wheezes Abdomen: Positive bowel sounds, soft nontender to palpation, nondistended Extremities: No clubbing, cyanosis, or edema    Skin: wounds at knees, both hands, left elbow,  right ankle.  Small coccyx wound present, minimal drainage, no signs of infection. Foam dressings at based of collar  Neuro: Alert,  Speech clear. Follows simple commands intermittently. Oriented to self only states he is not at home but he lives here Confused no agitation.  Moves all 4 limbs spontaneously but difficut to grade.  Appears to fatigue easy Musculoskeletal: no focal jt pain, nl prom. Right arm in sling    Assessment/Plan: 1. Functional deficits which require 3+ hours per day of interdisciplinary therapy in a comprehensive inpatient rehab setting. Physiatrist is providing close team supervision and 24 hour management of active medical problems listed below. Physiatrist and rehab team continue to assess barriers to discharge/monitor patient progress toward functional and medical goals  Care Tool:  Bathing        Body parts bathed by helper: Right arm, Left arm, Chest, Abdomen, Front perineal area, Buttocks, Right upper leg, Left upper leg, Right lower leg, Left lower leg, Face     Bathing assist Assist Level: 2 Helpers  Upper Body Dressing/Undressing Upper body dressing   What is the patient wearing?: Pull over shirt    Upper body assist Assist Level: Dependent - Patient 0%    Lower Body Dressing/Undressing Lower body dressing            Lower body assist Assist for lower body dressing: 2 Helpers     Toileting Toileting    Toileting assist Assist for toileting: Moderate Assistance - Patient 50 - 74%     Transfers Chair/bed transfer  Transfers assist     Chair/bed transfer assist level: Moderate Assistance - Patient 50 - 74%     Locomotion Ambulation   Ambulation assist   Ambulation activity did not occur: Safety/medical concerns  Assist level: Moderate Assistance - Patient 50 - 74% Assistive device: Hand held assist Max distance: 180   Walk 10 feet activity   Assist  Walk 10 feet activity did not occur: Safety/medical  concerns  Assist level: Moderate Assistance - Patient - 50 - 74% Assistive device: Hand held assist   Walk 50 feet activity   Assist Walk 50 feet with 2 turns activity did not occur: Safety/medical concerns  Assist level: Moderate Assistance - Patient - 50 - 74% Assistive device: Hand held assist    Walk 150 feet activity   Assist Walk 150 feet activity did not occur: Safety/medical concerns  Assist level: Moderate Assistance - Patient - 50 - 74% Assistive device: Hand held assist    Walk 10 feet on uneven surface  activity   Assist Walk 10 feet on uneven surfaces activity did not occur: Safety/medical concerns         Wheelchair     Assist Is the patient using a wheelchair?: Yes Type of Wheelchair: Manual    Wheelchair assist level: Dependent - Patient 0% (TIS)      Wheelchair 50 feet with 2 turns activity    Assist    Wheelchair 50 feet with 2 turns activity did not occur: Safety/medical concerns       Wheelchair 150 feet activity     Assist  Wheelchair 150 feet activity did not occur: Safety/medical concerns       Blood pressure (!) 165/93, pulse 78, temperature 98.3 F (36.8 C), temperature source Oral, resp. rate 16, height 5\' 11"  (1.803 m), weight 57 kg, SpO2 100 %.  Medical Problem List and Plan: 1. 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   -minimal/slow progress d/t waxing and waning behavior which is due to premorbid dementia, age, and TBI  -Behavior constant Rachos level V  Con't CIR- PT, OT and SLP- looking at SNF  -Adjusted to QD  2.  Antithrombotics: -DVT/anticoagulation:  Pharmaceutical: Lovenox 30 mg BID             -antiplatelet therapy: aspirin 81 mg daily 3. Pain Management: continue Tylenol per tube scheduled,\; oxycodone, Robaxin prn  10/23 Pt denies pain this AM 4. Mood/Behavior/Sleep: LCSW to evaluate and provide  emotional support             -antipsychotic agents: risperdal             -probably mild baseline dementia (ex-wife confirmed this)  -continue sleep chart   -10/20 did better in enclosure bed --continue   -continue risperdal at lunch and in evening   -  minimize any other neurosedating meds   -remove NGT  10/26 decreased risperdal to 0.5mg  HS yesterday, will stop noon dose  10/27 will increase ritalin dose to 15mg  BID 5. Neuropsych/cognition: This patient is not capable of making decisions on his own behalf.               6. Skin/Wound Care: routine skin care checks             -monitor scalp lac/skin abrasions             -appreciate WOC recs for unstageable sacral/coccyx pressure wound   -continue medihoney/foam dressing 7. Fluids/Electrolytes/Nutrition/dysphagia: Strict Is and Os and follow-up chemistries             -MBS --diet initiated D2/thins  10/12-decided to keep cortrak in for now, decreased HS TF  -pt appears to have eaten a little more this morning  -discussed PO intake with pt. Demonstrated SOME insight  -BMET WNL  -added megace to help stimulate appetite on 10/12  -10/20-ate well again for second consecutive day   -will dc cortrak   -add IVF at HS   -he's not on many medications fortunately    -needs risperdal for behavior--use disintegrating form -check BMET 10/21  10/22- BUN up to 32- will increase night time IVFs to 75 cc /hour for 12 hours/night 10/24 BUN 20 and Cr 1.03 stable, continue to monitor  Recheck labs today  8: Right distal clavicle and humerus fracture:    -10/25 Xrays reviewed by ortho today, continue sling and NWB RUE recommended.  F/u Dr. Marcelino Scot 3-4 weeks  18: Elevated transaminases: trending downward  -AST 28 and ALT 34 10/6, improved 19:  ?aspiration pneumonia w/x hx of staph and pseudomonas - vancomycin and cefepime total of 7 more days ended 10/5  -10/25 WBC stable at 8.8 yesterday 20: SVT/sinus tach intermittently: monitor 21: Mediastinal  hematoma: Echo WNL; Hgb stable 22: Ankle edema and ecchymosis: x-rays negative for osseous abnormality           Continue TEDS, elevation in bed 23. Loose stools improved. Had been constipated.   -10/26 LBM yesterday, fairly regular Bms, continue to monitor 24. Normocytic anemia/ABLA: likely related to hospitalization and chronic disease  -no gross blood loss  continue b complex and iron  -10/24 HGB up to 9.7  -recheck today 25. Hypoglycemia:     -he's not diabetic. Dc'ed cbg checks.  26. Hypokalemia  -10/27 Improved to 3.9 after supplement    LOS: 31 days A FACE TO [place] E [place] 02/19/2022, 1:22 PM

## 2022-02-20 NOTE — Plan of Care (Signed)
  Problem: Safety: Goal: Non-violent Restraint(s) Outcome: Progressing   Problem: Consults Goal: RH BRAIN INJURY PATIENT EDUCATION Description: Description: See Patient Education module for eduction specifics Outcome: Progressing Goal: Skin Care Protocol Initiated - if Braden Score 18 or less Description: If consults are not indicated, leave blank or document N/A Outcome: Progressing Goal: Nutrition Consult-if indicated Outcome: Progressing   Problem: RH BOWEL ELIMINATION Goal: RH STG MANAGE BOWEL WITH ASSISTANCE Description: STG Manage Bowel with max/total Assistance. Outcome: Progressing   Problem: RH BLADDER ELIMINATION Goal: RH STG MANAGE BLADDER WITH ASSISTANCE Description: STG Manage Bladder With total Assistance Outcome: Progressing Goal: RH STG MANAGE BLADDER WITH EQUIPMENT WITH ASSISTANCE Description: STG Manage Bladder With Equipment With total Assistance Outcome: Progressing   Problem: RH SKIN INTEGRITY Goal: RH STG SKIN FREE OF INFECTION/BREAKDOWN Description: Pressure injury to coccyx will improve and there will be no additional infection/breakdown while in rehab Outcome: Progressing Goal: RH STG MAINTAIN SKIN INTEGRITY WITH ASSISTANCE Description: STG Maintain Skin Integrity With mod Assistance. Outcome: Progressing Goal: RH STG ABLE TO PERFORM INCISION/WOUND CARE W/ASSISTANCE Description: STG Able To Perform Incision/Wound Care With total Assistance. Outcome: Progressing   Problem: RH SAFETY Goal: RH STG ADHERE TO SAFETY PRECAUTIONS W/ASSISTANCE/DEVICE Description: STG Adhere to Safety Precautions With mod/max Assistance/Device. Outcome: Progressing   Problem: RH COGNITION-NURSING Goal: RH STG USES MEMORY AIDS/STRATEGIES W/ASSIST TO PROBLEM SOLVE Description: STG Uses Memory Aids/Strategies With mod Assistance to Problem Solve. Outcome: Progressing Goal: RH STG ANTICIPATES NEEDS/CALLS FOR ASSIST W/ASSIST/CUES Description: STG Anticipates Needs/Calls for  Assist With mod Assistance/Cues. Outcome: Progressing   Problem: RH PAIN MANAGEMENT Goal: RH STG PAIN MANAGED AT OR BELOW PT'S PAIN GOAL Description: Pain will be managed at or below 3 out of 10 on pain scale Outcome: Progressing   Problem: RH KNOWLEDGE DEFICIT BRAIN INJURY Goal: RH STG INCREASE KNOWLEDGE OF SELF CARE AFTER BRAIN INJURY Description: Patient/caregiver will be able to perform self care with min assist from nursing education, therapies, and nursing handouts Outcome: Progressing Goal: RH STG INCREASE KNOWLEDGE OF DYSPHAGIA/FLUID INTAKE Description: Patient/ caregiver will be able to verbalize appropriate foods and textures from nursing education, therapies, and nursing handouts Outcome: Progressing

## 2022-02-20 NOTE — Progress Notes (Signed)
Occupational Therapy TBI Note  Patient Details  Name: Dominic Smith MRN: [id number] Date of Birth: August 20, 1940  Today's Date: 02/20/2022 OT Individual Time: 1545-1640 OT Individual Time Calculation (min): 55 min    Short Term Goals: Week 4:  OT Short Term Goal 1 (Week 4): Pt will complete toilet transfer with MIN A consistently OT Short Term Goal 1 - Progress (Week 4): Progressing toward goal OT Short Term Goal 2 (Week 4): Pt will complete 1/3 steps of LB dressing OT Short Term Goal 2 - Progress (Week 4): Met OT Short Term Goal 3 (Week 4): Pt will be oriented to place 25% of encounters OT Short Term Goal 3 - Progress (Week 4): Progressing toward goal  Skilled Therapeutic Interventions/Progress Updates:    Per RN, son requesting pt to go outside for therapy. Upon OT arrival, pt in posey bed with soft restraint mittens. Pt pleasant and easily roused by voice. Pt reports no pain and is agreeable to OT treatment. Pt completes supine to sit transfer with Min A and stand step transfer into w/c with Mod-Min A. Pt was transported outside of hospital with Total A and sat out in sun with Supervision. No agitation noted throughout session, Pt expresses happiness being outside and states "The sun feels good". Engaged in orientation and cognition questions with pt. Pt was given 2 choices to choose from during questions. Pt able to correctly state the bush was green and that his shirt is blue. Pt unable to state that this therapist pants were black, that there is a car versus a tractor, and that the month of the year was October not April. Pt able to state that the air felt good. Pt perseverates with nonsensical speech about his family throughout session. Pt was returned to his room via w/c and total A and completes stand pivot transfer with Mod A and sit to sidelying transfer with Min A. Pt's soft restraint mittens were donned and posey bed intact. Pt left with all needs met and safety measures in place.    Therapy Documentation Precautions:  Precautions Precautions: Fall, Cervical Precaution Booklet Issued: No Precaution Comments: cortrak, bilateral soft mitts Required Braces or Orthoses: Sling, Cervical Brace Cervical Brace: At all times, Hard collar Restrictions Weight Bearing Restrictions: Yes RUE Weight Bearing: Weight bearing as tolerated Other Position/Activity Restrictions: in sling, nonoperative mgmt per ortho 9/18    Agitated Behavior Scale: TBI   Agitated Behavior Scale (DO NOT LEAVE BLANKS) Short attention span, easy distractibility, inability to concentrate: Present to a slight degree Impulsive, impatient, low tolerance for pain or frustration: Absent Uncooperative, resistant to care, demanding: Absent Violent and/or threatening violence toward people or property: Absent Explosive and/or unpredictable anger: Absent Rocking, rubbing, moaning, or other self-stimulating behavior: Absent Pulling at tubes, restraints, etc.: Absent Wandering from treatment areas: Absent Restlessness, pacing, excessive movement: Absent Repetitive behaviors, motor, and/or verbal: Present to a slight degree Rapid, loud, or excessive talking: Absent Sudden changes of mood: Absent Easily initiated or excessive crying and/or laughter: Absent Self-abusiveness, physical and/or verbal: Absent Agitated behavior scale total score: 16   Therapy/Group: Individual Therapy  Marvetta Gibbons 02/20/2022, 5:13 PM

## 2022-02-21 DIAGNOSIS — I10 Essential (primary) hypertension: Secondary | ICD-10-CM

## 2022-02-21 LAB — BASIC METABOLIC PANEL
Anion gap: 6 (ref 5–15)
BUN: 18 mg/dL (ref 8–23)
CO2: 22 mmol/L (ref 22–32)
Calcium: 8.8 mg/dL — ABNORMAL LOW (ref 8.9–10.3)
Chloride: 109 mmol/L (ref 98–111)
Creatinine, Ser: 1.03 mg/dL (ref 0.61–1.24)
GFR, Estimated: >60 mL/min (ref >60)
Glucose, Bld: 109 mg/dL — ABNORMAL HIGH (ref 70–99)
Potassium: 3.8 mmol/L (ref 3.5–5.1)
Sodium: 137 mmol/L (ref 135–145)

## 2022-02-21 LAB — CBC
HCT: 33 % — ABNORMAL LOW (ref 39.0–52.0)
Hemoglobin: 10.6 g/dL — ABNORMAL LOW (ref 13.0–17.0)
MCH: 28.3 pg (ref 26.0–34.0)
MCHC: 32.1 g/dL (ref 30.0–36.0)
MCV: 88.2 fL (ref 80.0–100.0)
Platelets: 268 10*3/uL (ref 150–400)
RBC: 3.74 MIL/uL — ABNORMAL LOW (ref 4.22–5.81)
RDW: 13.6 % (ref 11.5–15.5)
WBC: 8.4 10*3/uL (ref 4.0–10.5)
nRBC: 0 % (ref 0.0–0.2)

## 2022-02-21 MED ORDER — MUPIROCIN 2 % EX OINT
1.0000 | TOPICAL_OINTMENT | Freq: Two times a day (BID) | CUTANEOUS | Status: AC
  Administered 2022-02-21 – 2022-02-25 (×10): 1 via NASAL
  Filled 2022-02-21: qty 22

## 2022-02-21 MED ORDER — CHLORHEXIDINE GLUCONATE CLOTH 2 % EX PADS
6.0000 | MEDICATED_PAD | Freq: Every day | CUTANEOUS | Status: AC
  Administered 2022-02-21 – 2022-02-23 (×3): 6 via TOPICAL

## 2022-02-21 MED ORDER — METOPROLOL TARTRATE 25 MG PO TABS
25.0000 mg | ORAL_TABLET | Freq: Two times a day (BID) | ORAL | Status: DC
  Administered 2022-02-21 – 2022-03-08 (×30): 25 mg via ORAL
  Filled 2022-02-21 (×31): qty 1

## 2022-02-21 MED ORDER — CHLORHEXIDINE GLUCONATE CLOTH 2 % EX PADS
6.0000 | MEDICATED_PAD | Freq: Every day | CUTANEOUS | Status: AC
  Administered 2022-02-21 – 2022-02-24 (×4): 6 via TOPICAL

## 2022-02-21 MED ORDER — MUPIROCIN 2 % EX OINT
1.0000 | TOPICAL_OINTMENT | Freq: Two times a day (BID) | CUTANEOUS | Status: DC
  Filled 2022-02-21: qty 22

## 2022-02-21 NOTE — Progress Notes (Signed)
Occupational Therapy TBI Note  Patient Details  Name: Matheau Orona MRN: [id number] Date of Birth: 07-Aug-1940  Today's Date: 02/21/2022 OT Individual Time: 6767-2094 OT Individual Time Calculation (min): 30 min    Short Term Goals: Week 5:  OT Short Term Goal 1 (Week 5): STG=LTG d/t ELOS  Skilled Therapeutic Interventions/Progress Updates:    Pt received sleeping in enclosure bed. Initially pt lethargic and difficult to arouse. Lifted HOB and pt then became more alert and was able to engage with therapist. Pt agreeable to sitting to EOB to eat some ice cream.  (NT reported that pt had been unwilling to eat breakfast or lunch so wanted to provide him a more appealing option with the magic cup) Pt needed total A to move to EOB and initially max A with balance to avoid falling back.  Pt was then able to get stabilized and sat at EOB for over 20 min engaging with this therapist smiling at my jokes, enjoying the ice cream, talking about his family.  Pt WAS oriented to month and that he was in the hospital! Attempted to have pt self feed the ice cream but he had difficulty manipulating spoon with L hand and could not maintain grasp at all in L hand.    His bed needed to be swept out so had NT clean out bed. Attempted to try to stand pt, but he was not able to push through his legs and unable to lift pt.   NT assisted me with moving pt back to supine.   Adjusted bed so NT could continue feeding pt. NT in room with pt.   Therapy Documentation Precautions:  Precautions Precautions: Fall, Cervical Precaution Booklet Issued: No Precaution Comments: cortrak, bilateral soft mitts Required Braces or Orthoses: Sling, Cervical Brace Cervical Brace: At all times, Hard collar Restrictions Weight Bearing Restrictions: Yes RUE Weight Bearing: Non weight bearing Other Position/Activity Restrictions: in sling, nonoperative mgmt per ortho 9/18    Vital Signs: Therapy Vitals Temp: 97.8 F (36.6  C) Temp Source: Oral Pulse Rate: 62 Resp: 15 BP: (Abnormal) 155/69 Patient Position (if appropriate): Lying Oxygen Therapy SpO2: 99 % O2 Device: Room Air Pain:  No c/o pain  Agitated Behavior Scale: TBI Observation Details Observation Environment: pt room Start of observation period - Date: 02/21/22 Start of observation period - Time: 1305 End of observation period - Date: 02/21/22 End of observation period - Time: 1335 Agitated Behavior Scale (DO NOT LEAVE BLANKS) Short attention span, easy distractibility, inability to concentrate: Present to a slight degree Impulsive, impatient, low tolerance for pain or frustration: Absent Uncooperative, resistant to care, demanding: Absent Violent and/or threatening violence toward people or property: Absent Explosive and/or unpredictable anger: Absent Rocking, rubbing, moaning, or other self-stimulating behavior: Absent Pulling at tubes, restraints, etc.: Absent Wandering from treatment areas: Absent Restlessness, pacing, excessive movement: Absent Repetitive behaviors, motor, and/or verbal: Absent Rapid, loud, or excessive talking: Absent Sudden changes of mood: Absent Easily initiated or excessive crying and/or laughter: Absent Self-abusiveness, physical and/or verbal: Absent Agitated behavior scale total score: 15      Therapy/Group: Individual Therapy  Ackerman 02/21/2022, 4:25 PM

## 2022-02-21 NOTE — Progress Notes (Signed)
PROGRESS NOTE   Subjective/Complaints:  Pt in enclosure bed this AM with soft restrain mittens. Eyes closed but answering questions.    ROS: limited by cognition  Objective:   No results found.  Recent Labs    02/18/22 1230 02/21/22 0600  WBC 8.3 8.4  HGB 10.2* 10.6*  HCT 31.7* 33.0*  PLT 278 268       Recent Labs    02/18/22 1230 02/21/22 0600  NA 138 137  K 3.8 3.8  CL 112* 109  CO2 23 22  GLUCOSE 114* 109*  BUN 17 18  CREATININE 1.19 1.03  CALCIUM 8.7* 8.8*       Intake/Output Summary (Last 24 hours) at 02/21/2022 [id number] Last data filed at 02/21/2022 0300 Gross per 24 hour  Intake 118 ml  Output 2100 ml  Net -1982 ml       Pressure Injury 01/13/22 Coccyx Mid Unstageable - Full thickness tissue loss in which the base of the injury is covered by slough (yellow, tan, gray, green or brown) and/or eschar (tan, brown or black) in the wound bed. skin is broken with redness at the (Active)  01/13/22 1930  Location: Coccyx  Location Orientation: Mid  Staging: Unstageable - Full thickness tissue loss in which the base of the injury is covered by slough (yellow, tan, gray, green or brown) and/or eschar (tan, brown or black) in the wound bed.  Wound Description (Comments): skin is broken with redness at the base  Present on Admission: Yes (present on admission to rehab 9/27)    Physical Exam: Vital Signs Blood pressure (!) 160/81, pulse 75, temperature 99.3 F (37.4 C), resp. rate 14, height 5\' 11"  (1.803 m), weight 57 kg, SpO2 100 %.   General: No acute distress Mood and affect are appropriate Heart: Regular rate and rhythm no rubs murmurs or extra sounds Lungs: Clear to auscultation, breathing unlabored, no rales or wheezes Abdomen: Positive bowel sounds, soft nontender to palpation, nondistended Extremities: No clubbing, cyanosis, or edema    Skin: wounds at knees, both hands, left elbow,  right ankle.  Small coccyx wound present, minimal drainage, no signs of infection. Foam dressings at based of collar  Neuro: Alert,  Speech clear. Follows simple commands intermittently. Oriented to self only states he is not at home but he lives here Confused no agitation.  Moves all 4 limbs spontaneously but difficut to grade.  Appears to fatigue easy Musculoskeletal: no focal jt pain, nl prom. Right arm in sling    Assessment/Plan: 1. Functional deficits which require 3+ hours per day of interdisciplinary therapy in a comprehensive inpatient rehab setting. Physiatrist is providing close team supervision and 24 hour management of active medical problems listed below. Physiatrist and rehab team continue to assess barriers to discharge/monitor patient progress toward functional and medical goals  Care Tool:  Bathing        Body parts bathed by helper: Right arm, Left arm, Chest, Abdomen, Front perineal area, Buttocks, Right upper leg, Left upper leg, Right lower leg, Left lower leg, Face     Bathing assist Assist Level: 2 Helpers     Upper Body Dressing/Undressing Upper body dressing   What  is the patient wearing?: Pull over shirt    Upper body assist Assist Level: Dependent - Patient 0%    Lower Body Dressing/Undressing Lower body dressing            Lower body assist Assist for lower body dressing: 2 Helpers     Toileting Toileting    Toileting assist Assist for toileting: Moderate Assistance - Patient 50 - 74%     Transfers Chair/bed transfer  Transfers assist     Chair/bed transfer assist level: Moderate Assistance - Patient 50 - 74%     Locomotion Ambulation   Ambulation assist   Ambulation activity did not occur: Safety/medical concerns  Assist level: Moderate Assistance - Patient 50 - 74% Assistive device: Hand held assist Max distance: 180   Walk 10 feet activity   Assist  Walk 10 feet activity did not occur: Safety/medical  concerns  Assist level: Moderate Assistance - Patient - 50 - 74% Assistive device: Hand held assist   Walk 50 feet activity   Assist Walk 50 feet with 2 turns activity did not occur: Safety/medical concerns  Assist level: Moderate Assistance - Patient - 50 - 74% Assistive device: Hand held assist    Walk 150 feet activity   Assist Walk 150 feet activity did not occur: Safety/medical concerns  Assist level: Moderate Assistance - Patient - 50 - 74% Assistive device: Hand held assist    Walk 10 feet on uneven surface  activity   Assist Walk 10 feet on uneven surfaces activity did not occur: Safety/medical concerns         Wheelchair     Assist Is the patient using a wheelchair?: Yes Type of Wheelchair: Manual    Wheelchair assist level: Dependent - Patient 0% (TIS)      Wheelchair 50 feet with 2 turns activity    Assist    Wheelchair 50 feet with 2 turns activity did not occur: Safety/medical concerns       Wheelchair 150 feet activity     Assist  Wheelchair 150 feet activity did not occur: Safety/medical concerns       Blood pressure (!) 160/81, pulse 75, temperature 99.3 F (37.4 C), resp. rate 14, height 5\' 11"  (1.803 m), weight 57 kg, SpO2 100 %.  Medical Problem List and Plan: 1. 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   -minimal/slow progress d/t waxing and waning behavior which is due to premorbid dementia, age, and TBI  -Behavior constant Rachos level V  Con't CIR- PT, OT and SLP- looking at SNF  -Adjusted to QD  2.  Antithrombotics: -DVT/anticoagulation:  Pharmaceutical: Lovenox 30 mg BID             -antiplatelet therapy: aspirin 81 mg daily 3. Pain Management: continue Tylenol per tube scheduled,\; oxycodone, Robaxin prn  10/23 Pt denies pain this AM 4. Mood/Behavior/Sleep: LCSW to evaluate and provide emotional support              -antipsychotic agents: risperdal             -probably mild baseline dementia (ex-wife confirmed this)  -continue sleep chart   -10/20 did better in enclosure bed --continue   -continue risperdal at lunch and in evening   -minimize any other neurosedating meds   -remove NGT  10/26 decreased  risperdal to 0.5mg  HS yesterday, will stop noon dose  10/27 will increase ritalin dose to 15mg  BID 5. Neuropsych/cognition: This patient is not capable of making decisions on his own behalf.               6. Skin/Wound Care: routine skin care checks             -monitor scalp lac/skin abrasions             -appreciate WOC recs for unstageable sacral/coccyx pressure wound   -continue medihoney/foam dressing 7. Fluids/Electrolytes/Nutrition/dysphagia: Strict Is and Os and follow-up chemistries             -MBS --diet initiated D2/thins  10/12-decided to keep cortrak in for now, decreased HS TF  -pt appears to have eaten a little more this morning  -discussed PO intake with pt. Demonstrated SOME insight  -BMET WNL  -added megace to help stimulate appetite on 10/12  -10/20-ate well again for second consecutive day   -will dc cortrak   -add IVF at HS   -he's not on many medications fortunately    -needs risperdal for behavior--use disintegrating form -check BMET 10/21  10/22- BUN up to 32- will increase night time IVFs to 75 cc /hour for 12 hours/night 10/30 BUN and Cr stable  8: Right distal clavicle and humerus fracture:    -10/25 Xrays reviewed by ortho today, continue sling and NWB RUE recommended.  F/u Dr. Marcelino Scot 3-4 weeks  18: Elevated transaminases: trending downward  -AST 28 and ALT 34 10/6, improved 19:  ?aspiration pneumonia w/x hx of staph and pseudomonas - vancomycin and cefepime total of 7 more days ended 10/5  -10/30 WBC stable at 8.4 20: SVT/sinus tach intermittently: monitor 21: Mediastinal hematoma: Echo WNL; Hgb stable 22: Ankle edema and ecchymosis: x-rays negative for  osseous abnormality           Continue TEDS, elevation in bed 23. Loose stools improved. Had been constipated.   -10/30 last significant BM on 10/28 but had smear today, regular Bms overall, Continue miralax 24. Normocytic anemia/ABLA: likely related to hospitalization and chronic disease  -no gross blood loss  continue b complex and iron  -10/30 HGB improved to 10.6 25. Hypoglycemia:     -he's not diabetic. Dc'ed cbg checks.  26. Hypokalemia  -10/30 Stable at K+ 3.8 27. HTN  -Start metoprolol 25mg  BID    LOS: 33 days A FACE TO FACE EVALUATION WAS PERFORMED  Jennye Boroughs 02/21/2022, 8:07 AM

## 2022-02-21 NOTE — Progress Notes (Signed)
Physical Therapy TBI Note  Patient Details  Name: Dominic Smith MRN: [id number] Date of Birth: Jul 23, 1940  Today's Date: 02/21/2022 PT Individual Time: 0945-1025 PT Individual Time Calculation (min): 40 min   Short Term Goals: Week 5:  PT Short Term Goal 1 (Week 5): STG = LTG d/t ELOS  Skilled Therapeutic Interventions/Progress Updates:     Patient asleep in secure enclosure bed upon PT arrival. Patient very slow to arouse with his eyes closed most of the session. Patient grimacing with mobility and reported increased back pain, RN made aware. PT provided repositioning, rest breaks, and distraction as pain interventions throughout session.   Donned L thigh high TED hose and non-skid sock with total A. Noted dressing with skin tears to L knee and thigh, did not don TED hose on L lower extremity. Abdominal binder adjusted for BP support during session. Donned pants with total A bed level prior to mobility.   Therapeutic Activity: Bed Mobility: Patient performed supine to sit with max-total A due to poor initiation and patient arousal. Provided verbal cues for initiation and sequencing. Transfers: Patient performed sit to/from stand x1 on the standing scale with max A +2, progressed to mod A of 1 with L upper extremity support once in standing.   Attained patient weight in standing: 110.8 lbs.  Progress to pivot to TIS w/c and scooting back x2 with max A +2. Patient positioned in TIS w/c and handed off to NT and patient's son to attempt to assist patient with eating something this morning, as breakfast was untouched in the room.   Patient required increased time for initiation, cuing, rest breaks, and for completion of tasks throughout session. Utilized therapeutic use of self throughout to promote efficiency.   Therapy Documentation Precautions:  Precautions Precautions: Fall, Cervical Precaution Booklet Issued: No Precaution Comments: cortrak, bilateral soft mitts Required Braces  or Orthoses: Sling, Cervical Brace Cervical Brace: At all times, Hard collar Restrictions Weight Bearing Restrictions: Yes RUE Weight Bearing: Weight bearing as tolerated Other Position/Activity Restrictions: in sling, nonoperative mgmt per ortho 9/18    Therapy/Group: Individual Therapy  Koran Seabrook L Tyreanna Bisesi PT, DPT, NCS, CBIS  02/21/2022, 12:57 PM

## 2022-02-22 DIAGNOSIS — E639 Nutritional deficiency, unspecified: Secondary | ICD-10-CM

## 2022-02-22 MED ORDER — ENSURE ENLIVE PO LIQD
237.0000 mL | Freq: Three times a day (TID) | ORAL | Status: DC
  Administered 2022-02-22 – 2022-03-08 (×32): 237 mL via ORAL

## 2022-02-22 NOTE — Progress Notes (Incomplete)
Patient ID: Dominic Smith, male   DOB: [birth year], 81 y.o.   MRN: [id number]

## 2022-02-22 NOTE — Progress Notes (Signed)
PROGRESS NOTE   Subjective/Complaints:  Eyes closed in enclosure bed. No complaints elicited. He had completed therapy a few minutes ago. He denies pain.    ROS: limited by cognition  Objective:   No results found.  Recent Labs    02/21/22 0600  WBC 8.4  HGB 10.6*  HCT 33.0*  PLT 268       Recent Labs    02/21/22 0600  NA 137  K 3.8  CL 109  CO2 22  GLUCOSE 109*  BUN 18  CREATININE 1.03  CALCIUM 8.8*       Intake/Output Summary (Last 24 hours) at 02/22/2022 0902 Last data filed at 02/22/2022 [id number] Gross per 24 hour  Intake 50 ml  Output 1650 ml  Net -1600 ml       Pressure Injury 01/13/22 Coccyx Mid Unstageable - Full thickness tissue loss in which the base of the injury is covered by slough (yellow, tan, gray, green or brown) and/or eschar (tan, brown or black) in the wound bed. skin is broken with redness at the (Active)  01/13/22 1930  Location: Coccyx  Location Orientation: Mid  Staging: Unstageable - Full thickness tissue loss in which the base of the injury is covered by slough (yellow, tan, gray, green or brown) and/or eschar (tan, brown or black) in the wound bed.  Wound Description (Comments): skin is broken with redness at the base  Present on Admission: Yes (present on admission to rehab 9/27)    Physical Exam: Vital Signs Blood pressure (!) 183/89, pulse 77, temperature 99.1 F (37.3 C), temperature source Oral, resp. rate 16, height 5\' 11"  (1.803 m), weight 50.3 kg, SpO2 99 %.   General: No acute distress, appears comfortable.  Mood and affect are appropriate Heart: Regular rate and rhythm no rubs murmurs or extra sounds Lungs: Clear to auscultation, breathing unlabored, no rales or wheezes Abdomen: Positive bowel sounds, soft nontender to palpation, nondistended Extremities: No clubbing, cyanosis, or edema    Skin: wounds at knees, both hands, left elbow, right ankle.   Small coccyx wound present, minimal drainage, no signs of infection.   Neuro: Alert,  Speech clear. Follows simple commands intermittently. Oriented to self  and hospital today Confused with no agitation.  Moves all 4 limbs spontaneously but difficut to grade.  Appears to fatigue easy Musculoskeletal: no focal jt pain, nl prom. Right arm in sling    Assessment/Plan: 1. Functional deficits which require 3+ hours per day of interdisciplinary therapy in a comprehensive inpatient rehab setting. Physiatrist is providing close team supervision and 24 hour management of active medical problems listed below. Physiatrist and rehab team continue to assess barriers to discharge/monitor patient progress toward functional and medical goals  Care Tool:  Bathing        Body parts bathed by helper: Right arm, Left arm, Chest, Abdomen, Front perineal area, Buttocks, Right upper leg, Left upper leg, Right lower leg, Left lower leg, Face     Bathing assist Assist Level: 2 Helpers     Upper Body Dressing/Undressing Upper body dressing   What is the patient wearing?: Pull over shirt    Upper body assist Assist Level: Dependent -  Patient 0%    Lower Body Dressing/Undressing Lower body dressing            Lower body assist Assist for lower body dressing: 2 Helpers     Toileting Toileting    Toileting assist Assist for toileting: Moderate Assistance - Patient 50 - 74%     Transfers Chair/bed transfer  Transfers assist     Chair/bed transfer assist level: Moderate Assistance - Patient 50 - 74%     Locomotion Ambulation   Ambulation assist   Ambulation activity did not occur: Safety/medical concerns  Assist level: Moderate Assistance - Patient 50 - 74% Assistive device: Hand held assist Max distance: 180   Walk 10 feet activity   Assist  Walk 10 feet activity did not occur: Safety/medical concerns  Assist level: Moderate Assistance - Patient - 50 - 74% Assistive device:  Hand held assist   Walk 50 feet activity   Assist Walk 50 feet with 2 turns activity did not occur: Safety/medical concerns  Assist level: Moderate Assistance - Patient - 50 - 74% Assistive device: Hand held assist    Walk 150 feet activity   Assist Walk 150 feet activity did not occur: Safety/medical concerns  Assist level: Moderate Assistance - Patient - 50 - 74% Assistive device: Hand held assist    Walk 10 feet on uneven surface  activity   Assist Walk 10 feet on uneven surfaces activity did not occur: Safety/medical concerns         Wheelchair     Assist Is the patient using a wheelchair?: Yes Type of Wheelchair: Manual    Wheelchair assist level: Dependent - Patient 0% (TIS)      Wheelchair 50 feet with 2 turns activity    Assist    Wheelchair 50 feet with 2 turns activity did not occur: Safety/medical concerns       Wheelchair 150 feet activity     Assist  Wheelchair 150 feet activity did not occur: Safety/medical concerns       Blood pressure (!) 183/89, pulse 77, temperature 99.1 F (37.3 C), temperature source Oral, resp. rate 16, height 5\' 11"  (1.803 m), weight 50.3 kg, SpO2 99 %.  Medical Problem List and Plan: 1. 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   -minimal/slow progress d/t waxing and waning behavior which is due to premorbid dementia, age, and TBI  -Behavior constant Rachos level V  Con't CIR- PT, OT and SLP- looking at SNF  -Adjusted to QD  -Team conference today  2.  Antithrombotics: -DVT/anticoagulation:  Pharmaceutical: Lovenox 30 mg BID             -antiplatelet therapy: aspirin 81 mg daily 3. Pain Management: continue Tylenol per tube scheduled,\; oxycodone, Robaxin prn  10/23 Pt denies pain this AM 4. Mood/Behavior/Sleep: LCSW to evaluate and provide emotional support             -antipsychotic agents:  risperdal             -probably mild baseline dementia (ex-wife confirmed this)  -continue sleep chart   -10/20 did better in enclosure bed --continue   -continue risperdal at lunch and in evening   -minimize any other neurosedating meds   -remove NGT  10/26 decreased risperdal to 0.5mg  HS yesterday, will stop noon dose  10/27 will increase ritalin dose to 15mg  BID  10/31 Will stop risperdal 5. Neuropsych/cognition: This patient is not capable of making decisions on his own behalf.               6. Skin/Wound Care: routine skin care checks             -monitor scalp lac/skin abrasions             -appreciate WOC recs for unstageable sacral/coccyx pressure wound   -continue medihoney/foam dressing 7. Fluids/Electrolytes/Nutrition/dysphagia: Strict Is and Os and follow-up chemistries             -MBS --diet initiated D2/thins  10/12-decided to keep cortrak in for now, decreased HS TF  -pt appears to have eaten a little more this morning  -discussed PO intake with pt. Demonstrated SOME insight  -BMET WNL  -added megace to help stimulate appetite on 10/12  -10/20-ate well again for second consecutive day   -will dc cortrak   -add IVF at HS   -he's not on many medications fortunately    -needs risperdal for behavior--use disintegrating form -check BMET 10/21  10/22- BUN up to 32- will increase night time IVFs to 75 cc /hour for 12 hours/night 10/30 BUN and Cr stable -10/31 Decreased intake yesterday, ate better day before, Will consult dietician  8: Right distal clavicle and humerus fracture:    -10/25 Xrays reviewed by ortho today, continue sling and NWB RUE recommended.  F/u Dr. Marcelino Scot 3-4 weeks  18: Elevated transaminases: trending downward  -AST 28 and ALT 34 10/6, improved 19:  ?aspiration pneumonia w/x hx of staph and pseudomonas - vancomycin and cefepime total of 7 more days ended 10/5  -10/30 WBC stable at 8.4 20: SVT/sinus tach intermittently: monitor 21: Mediastinal  hematoma: Echo WNL; Hgb stable 22: Ankle edema and ecchymosis: x-rays negative for osseous abnormality           Continue TEDS, elevation in bed 23. Loose stools improved. Had been constipated.   -10/30 last significant BM on 10/28 but had smear today, regular Bms overall, Continue miralax 24. Normocytic anemia/ABLA: likely related to hospitalization and chronic disease  -no gross blood loss  continue b complex and iron  -10/30 HGB improved to 10.6 25. Hypoglycemia:     -he's not diabetic. Dc'ed cbg checks.  26. Hypokalemia  -10/30 Stable at K+ 3.8 27. HTN  -10/31 Started metoprolol 25mg  BID yesterday, monitor response    LOS: 34 days A FACE TO FACE EVALUATION WAS PERFORMED  Jennye Boroughs 02/22/2022, 9:02 AM

## 2022-02-22 NOTE — Progress Notes (Signed)
Physical Therapy TBI Note  Patient Details  Name: Dominic Smith MRN: [id number] Date of Birth: 23-Nov-1940  Today's Date: 02/22/2022 PT Individual Time: 0925-0940, [id number], and 5638-9373 PT Individual Time Calculation (min): 15 min, 30 min, and 10 min   Short Term Goals: Week 4:  PT Short Term Goal 1 (Week 4): Patient will perform bed mobility with min A consistently. PT Short Term Goal 1 - Progress (Week 4): Not met PT Short Term Goal 2 (Week 4): Patient will perform basic transfers with min A >75% of the time. PT Short Term Goal 2 - Progress (Week 4): Not met PT Short Term Goal 3 (Week 4): Patient will ambulate >200 feet with min A using LRAD without additional assist for LOB. PT Short Term Goal 3 - Progress (Week 4): Not met PT Short Term Goal 4 (Week 4): Patient will perform dynamic standing balance >2 min with min A. PT Short Term Goal 4 - Progress (Week 4): Progressing toward goal Week 5:  PT Short Term Goal 1 (Week 5): STG = LTG d/t ELOS  Skilled Therapeutic Interventions/Progress Updates:     Session 1: Initiated session to apply B ace wraps for BP management during later therapy sessions for increased time for mobility during sessions. Patient in open enclosure bed with RN in the room upon PT arrival. Patient lethargic with minimal arousal and verbalization with eyes closed throughout session.   Discussed PO intake with RN, patient did not eat breakfast this morning. Per discussion with RN applied Xeroform and foam dressings to skin tears on patient's R knee. Then applied B thigh high ACE wraps for BP management. Patient participated in orientation questions, limited by decreased arousal. Patient oriented to his first name only. Provided orientation and education on TBI symptoms. Repositioned patient in bed due to legs flexed and windswept R in the bed. Floated B heels on pillow.   Patient asleep in secure enclosure bed in the room at end of session with breaks locked and all  need in reach.   Session 2: Patient sitting at nurses station eating lunch with 1-1 supervision, handed off from RN. Patient required max cues and total A to take 3 additional bites of vanilla magic cup before refusing any other food options despite increased time between bites and multiple offers of several different options.   Patient transported via TIS w/c to the Day room to focus on functional transfers. Attempted sit to/from stand x4 with L hand on therapist's shoulder, L hand on IV pole, L HHA, and B arms on therapists shoulders with total A. No notable lower extremity activation from patient and patient with B knees and hips flexed without extension despite max facilitation. Attempted x2 in [place] with R hand pulling up. Patient again without initiation beyond reaching forward and grabbing the bar in front of him. Patient denies pain. Stated "I only stand with certain people" x2 with no further explanation to why he was not initiating transfer.   RN made aware of patient's poor performance with transfers and made plan for PT to return later this afternoon to transfer patient back to bed for nursing safety due to heavy transfer assist. Patient requested to stay sitting at the nurses station at end of session.   Patient in [place] w/c handed-off to the nurse secretary for 1-1 supervision at end of session with breaks locked, seat belt alarm set, B mitts donned, and all needs within reach.   Session 3: PT returned to transfer patient back to bed.  Transported patient back to his room via TIS w/c. Patient performed squat pivot TIS w/c>bed with total A and max cues for sequencing and initiation. Performed sitting balance EOC with CGA-min A ~1 min then assisted to supine and performed scooting up in bed x1 with total A. Patient did not follow cues to initiate laying down or scooting. Would verbally acknowledge what PT said to do, but unable to initiate mobility.    Discussed concerns with patient's  functional decline during team conference this morning and with RN and CSW again this afternoon.   Therapy Documentation Precautions:  Precautions Precautions: Fall, Cervical Precaution Booklet Issued: No Precaution Comments: cortrak, bilateral soft mitts Required Braces or Orthoses: Sling, Cervical Brace Cervical Brace: At all times, Hard collar Restrictions Weight Bearing Restrictions: Yes RUE Weight Bearing: Non weight bearing Other Position/Activity Restrictions: in sling, nonoperative mgmt per ortho 9/18    Therapy/Group: Individual Therapy  Maripat Borba L Wasil Wolke PT, DPT, NCS, CBIS  02/22/2022, 5:05 PM

## 2022-02-22 NOTE — Progress Notes (Addendum)
Nutrition Follow-up / Consult  DOCUMENTATION CODES:   Not applicable  INTERVENTION:   To maximize kcal and protein intake, change supplement to Ensure Enlive/Plus po TID, each supplement provides 350 kcal and 20 grams of protein.  Continue B-complex with vitamin C.  Magic cup TID with meals, each supplement provides 290 kcal and 9 grams of protein.  Calorie count x 48 hours.   NUTRITION DIAGNOSIS:   Inadequate oral intake related to lethargy/confusion as evidenced by meal completion < 50%.  Progressing   GOAL:   Patient will meet greater than or equal to 90% of their needs  Progressing   MONITOR:   PO intake, Supplement acceptance, Labs  REASON FOR ASSESSMENT:   Consult Assessment of nutrition requirement/status, Poor PO, Calorie Count  ASSESSMENT:   81 y.o. 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 PMH reviewed.  Cortrak was removed 10/20. Patient is now in an enclosure bed.  Currently on a dysphagia 2 diet with thin liquids. Intake of meals is variable. He ate 75-100% of dinner and lunch on 10/29. Yesterday's intake was 0-25-30%.   He is being offered Boost Breeze TID between meals, per documentation, he is accepting 1-3 supplements per day. He drank ~60 ml of Ensure with SLP today.   Labs reviewed.  Medications reviewed and include B-complex with vitamin C, Miralax.  Diet Order:   Diet Order             DIET DYS 2 Room service appropriate? Yes; Fluid consistency: Thin  Diet effective now                   EDUCATION NEEDS:   Not appropriate for education at this time  Skin:  Skin Assessment: Skin Integrity Issues: Skin Integrity Issues:: Unstageable, Other (Comment) Unstageable: Unstageable wound to coccyx Other: MASD perineum  Last BM:  10/27 type 5  Height:   Ht Readings from Last 1 Encounters:  01/19/22 5\' 11"  (1.803 m)    Weight:   Wt Readings from Last 1 Encounters:   02/21/22 50.3 kg    Ideal Body Weight:  78.2 kg  BMI:  Body mass index is 15.45 kg/m.  Estimated Nutritional Needs:   Kcal:  1800-2000  Protein:  90-105 grams  Fluid:  >/= 1.8 L   Lucas Mallow RD, LDN, CNSC Please refer to Amion for contact information.

## 2022-02-22 NOTE — Progress Notes (Signed)
Patient moved to regular bed with TS. Mats to both sides of bed.

## 2022-02-22 NOTE — Patient Care Conference (Signed)
Inpatient RehabilitationTeam Conference and Plan of Care Update Date: 02/22/2022   Time: 10:31 AM    Patient Name: Dominic Smith      Medical Record Number: [id number]  Date of Birth: 05-Oct-1940 Sex: Male         Room/Bed: 4W10C/4W10C-01 Payor Info: Payor: MEDICARE / Plan: MEDICARE PART A / Product Type: *No Product type* /    Admit Date/Time:  01/19/2022  2:31 PM  Primary Diagnosis:  TBI (traumatic brain injury) [facility])  Hospital Problems: Principal Problem:   TBI (traumatic brain injury) ([place]) Active Problems:   HCAP (healthcare-associated pneumonia)   Azotemia   Urinary retention   Hypervolemia   Closed fracture of head of right humerus    Expected Discharge Date: Expected Discharge Date:  (SNF)  Team Members Present: Physician leading conference: Other (comment) (Dr Durel Salts) Social Worker Present: Loralee Pacas, [place] Nurse Present: Other (comment) Tacy Learn, RN) PT Present: Apolinar Junes, PT OT Present: Mariane Masters, OT SLP Present: Sherren Kerns, SLP PPS Coordinator present : Gunnar Fusi, SLP     Current Status/Progress Goal Weekly Team Focus  Bowel/Bladder   Incont of bowel. Foley in place.  Regain cont of bowel.  Assess qshift & prn.   Swallow/Nutrition/ Hydration   dys 2 diet with thin liquids, mod A  min A  tolerance of current diet, use of swallowing comensatory strategies   ADL's   mod A SPT, MOD A for ADLs, confusion and disorientation remains, NWB remains wiht RUE, rancho 5, QD schedule  MOD A  orientation, basic transfers, funcitonal mobility, self feeding   Mobility   Overall functional decline, mod-total A for bed mobility and transfers, non-ambulatory due to decreased arousal and declined initiation by patient  Min A overall  Activity tolerance, arousal, initiation, functional mobility, motor planning, orientation, balance, patient/caregiver education   Communication   sup A  sup A  goals met   Safety/Cognition/  Behavioral Observations  Rancho Level V; max-to-total A with language of confusion  Mod A  sustained attention, orientation, awareness   Pain   0 of 10 pain  Remain pain free.  Assess qshift & prn.   Skin   Unstagable to buttocks and MASD.  Promote wound healing and prevention of infection.  Assess qshift & prn.     Discharge Planning:  Pt will discharge to SNF. D/c pending medical readiness. Family exploring preferred SNFs at this time.   Team Discussion: TBI. Engineer, manufacturing). Incontinent of bowel. Foley to remain.  Pain improved. Unstagable to coccyx with MASD. RUE- NWB. Restraint removed. Patient requires encouragement for PO intake. Therapies limited due to arousal.  Patient on target to meet rehab goals: no, patient has not progressed this week  *See Care Plan and progress notes for long and short-term goals.   Revisions to Treatment Plan:  Medications, safety, remove enclosure bed/restraints, encourage PO intake, etc.   Teaching Needs: Medications, safety, gait/transfer training, skin/wound care, etc.   Current Barriers to Discharge: Medical stability, Incontinence, Wound care, Lack of/limited family support, Weight, Weight bearing restrictions, and Behavior  Possible Resolutions to Barriers: Removal of restraints for SNF placement.      Medical Summary Current Status: complex medically, declining overall  Barriers to Discharge: Behavior;Decreased family/caregiver support;Home enviroment access/layout;Wound care;Nutrition means;Medication compliance;Medical stability;Weight bearing restrictions  Barriers to Discharge Comments: dysphagia w/ poor PO intakes, cogniitive deficits, behavioral issues, sleep-wake cycle dysregulation, ongoing lethargy Possible Resolutions to [employer]: continue working with therapies, working on [employer], titrating antipsychotic  Continued Need for Acute Rehabilitation Level of Care: The patient requires daily medical  management by a physician with specialized training in physical medicine and rehabilitation for the following reasons: Direction of a multidisciplinary physical rehabilitation program to maximize functional independence : Yes Medical management of patient stability for increased activity during participation in an intensive rehabilitation regime.: Yes Analysis of laboratory values and/or radiology reports with any subsequent need for medication adjustment and/or medical intervention. : Yes   I attest that I was present, lead the team conference, and concur with the assessment and plan of the team.   Ernest Pine 02/22/2022, 2:19 PM

## 2022-02-22 NOTE — Progress Notes (Signed)
Occupational Therapy TBI Note  Patient Details  Name: Dominic Smith MRN: [id number] Date of Birth: October 05, 1940  Today's Date: 02/22/2022 OT Individual Time: 1137-1201 OT Individual Time Calculation (min): 24 min    Short Term Goals: Week 1:  OT Short Term Goal 1 (Week 1): Pt will consistently transfer to toilet with 1 caregiver OT Short Term Goal 1 - Progress (Week 1): Met OT Short Term Goal 2 (Week 1): Pt will complete 1/4 steps of donning shirt OT Short Term Goal 2 - Progress (Week 1): Progressing toward goal OT Short Term Goal 3 (Week 1): Pt will complete oral care with MOD A OT Short Term Goal 3 - Progress (Week 1): Progressing toward goal OT Short Term Goal 4 (Week 1): Pt will sit to stand with MOD A or less OT Short Term Goal 4 - Progress (Week 1): Met  Skilled Therapeutic Interventions/Progress Updates:     Pt received in bed with no pain  Therapeutic activity Pt pleasantly confused and internally distracted. Pt unable to follow commands today for mobility. Pt completes sitting balance after 4 min with supervision but then needs MAX A to stand pivot transfer to TIS. Safety belt placed, mitts andwarm blanket provided. Checked vitals 2x and stable in TIS tilted back. Pt falling asleep. Taken to RN station to be sat up to eat lunch.   Pt left at end of session in bed with exit alarm on, call light in reach and all needs met   Therapy Documentation Precautions:  Precautions Precautions: Fall, Cervical Precaution Booklet Issued: No Precaution Comments: cortrak, bilateral soft mitts Required Braces or Orthoses: Sling, Cervical Brace Cervical Brace: At all times, Hard collar Restrictions Weight Bearing Restrictions: Yes RUE Weight Bearing: Non weight bearing Other Position/Activity Restrictions: in sling, nonoperative mgmt per ortho 9/18 General:    Agitated Behavior Scale: TBI ABS discontinued d/t ABS score less than 20 for the last three days or no behaviors  present        Therapy/Group: Individual Therapy  [name] 02/22/2022, 6:58 AM

## 2022-02-22 NOTE — Progress Notes (Signed)
Speech Language Pathology TBI Note  Patient Details  Name: Dominic Smith MRN: [id number] Date of Birth: [birth year]  Today's Date: 02/22/2022 SLP Individual Time: 0800-0830 SLP Individual Time Calculation (min): 30 min  Short Term Goals: Week 5: SLP Short Term Goal 1 (Week 5): Patient will consume current diet with minimal overt s/s of aspiration and Min verbal cues for use of swallowing compensatory strategies. SLP Short Term Goal 2 (Week 5): Patient will orient to place,  month and situation with Max A multimodal cues. SLP Short Term Goal 3 (Week 5): Patient will demonstate sustained attenton to functional tasks for 10 minutes with Max A multimodal cues for redirection.  Skilled Therapeutic Interventions: Skilled ST treatment focused on cognitive and swallowing goals. Pt was greeted in enclosure bed and accompanied by NT who reported pt did not exhibit interest in consuming morning meal. Pt was alert upon arrival but became progressively fatigued throughout session. Pt was agreeable to consuming ~33m of vanilla ensure via straw without overt s/sx of aspiration. Pt resisted bites of dys 2 solids ad evidenced pursed lips and minimal-to-no oral acceptance. Pt did receive a small bite of scrambled eggs which resulted in him orally expelling bolus. Pt was oriented to self only and required max-to-total A to orient to place/time/situation. Pt was unable to direct attention to external orientation aids. Pt exhibited language of confusion and significant internal distractibility throughout, limited to <30 seconds with max A verbal redirection. Patient was left in enclosure bed with soft mits in place, telesitter and and immediate needs met at end of session. Continue per current plan of care.      Pain  None/denied  Agitated Behavior Scale: TBI Observation Details Observation Environment: pt room Start of observation period - Date: 02/22/22 Start of observation period - Time: 0800 End of  observation period - Date: 02/22/22 End of observation period - Time: 0845 Agitated Behavior Scale (DO NOT LEAVE BLANKS) Short attention span, easy distractibility, inability to concentrate: Present to a moderate degree Impulsive, impatient, low tolerance for pain or frustration: Absent Uncooperative, resistant to care, demanding: Absent Violent and/or threatening violence toward people or property: Absent Explosive and/or unpredictable anger: Absent Rocking, rubbing, moaning, or other self-stimulating behavior: Absent Pulling at tubes, restraints, etc.: Absent Wandering from treatment areas: Absent Restlessness, pacing, excessive movement: Absent Repetitive behaviors, motor, and/or verbal: Absent Rapid, loud, or excessive talking: Absent Sudden changes of mood: Absent Easily initiated or excessive crying and/or laughter: Absent Self-abusiveness, physical and/or verbal: Absent Agitated behavior scale total score: 16  Therapy/Group: Individual Therapy  BPatty Sermons10/31/2023, 8:39 AM

## 2022-02-23 MED ORDER — MEGESTROL ACETATE 400 MG/10ML PO SUSP
400.0000 mg | Freq: Two times a day (BID) | ORAL | Status: DC
  Administered 2022-02-23 – 2022-03-08 (×27): 400 mg via ORAL
  Filled 2022-02-23 (×27): qty 10

## 2022-02-23 NOTE — Plan of Care (Signed)
  Problem: RH Swallowing Goal: LTG Patient will consume least restrictive diet using compensatory strategies with assistance (SLP) Description: LTG:  Patient will consume least restrictive diet using compensatory strategies with assistance (SLP) Flowsheets (Taken 02/23/2022 1650) LTG: Pt Patient will consume least restrictive diet using compensatory strategies with assistance of (SLP): Minimal Assistance - Patient > 75%   Problem: RH Comprehension Communication Goal: LTG Patient will comprehend basic/complex auditory (SLP) Description: LTG: Patient will comprehend basic/complex auditory information with cues (SLP). Flowsheets (Taken 02/23/2022 1650) LTG: Patient will comprehend auditory information with cueing (SLP): Moderate Assistance - Patient 50 - 74%   Problem: RH Attention Goal: LTG Patient will demonstrate this level of attention during functional activites (SLP) Description: LTG:  Patient will will demonstrate this level of attention during functional activites (SLP) Flowsheets (Taken 02/23/2022 1650) Number of minutes patient will demonstrate attention during cognitive/linguistic activities: 10

## 2022-02-23 NOTE — Progress Notes (Signed)
Speech Language Pathology Weekly Progress and Session Note  Patient Details  Name: Larin Weissberg MRN: [id number] Date of Birth: Jan 04, 1941  Beginning of progress report period: February 17, 2022 End of progress report period: February 23, 2022  Today's Date: 02/23/2022 SLP Individual Time: 1415-1445 SLP Individual Time Calculation (min): 30 min  Short Term Goals: Week 5: SLP Short Term Goal 1 (Week 5): Patient will consume current diet with minimal overt s/s of aspiration and Min verbal cues for use of swallowing compensatory strategies. SLP Short Term Goal 1 - Progress (Week 5): Met SLP Short Term Goal 2 (Week 5): Patient will orient to place,  month and situation with Max A multimodal cues. SLP Short Term Goal 2 - Progress (Week 5): Not met SLP Short Term Goal 3 (Week 5): Patient will demonstate sustained attenton to functional tasks for 10 minutes with Max A multimodal cues for redirection. SLP Short Term Goal 3 - Progress (Week 5): Not met  New Short Term Goals: Week 6: SLP Short Term Goal 1 (Week 6): Patient will consume current diet with minimal overt s/s of aspiration and sup verbal cues for use of swallowing compensatory strategies. SLP Short Term Goal 2 (Week 6): Patient will orient to place, month and situation with Max A multimodal cues. SLP Short Term Goal 3 (Week 6): Patient will demonstate sustained attenton to functional tasks for 7 minutes with Max A multimodal cues for redirection.  Weekly Progress Updates: Patient continues to make made slow gains and has met 1 of 3 STGs this reporting period. Patient demonstrates behaviors consistent with a Rancho Level V and requires overall Max-Total A multimodal cues to complete functional and familiar tasks safely in regards to sustained attention, orientation, and following simple commands. Pt continues to be limited by fatigue, language of confusion, and substantial internal distractibility. Pt has progressed with swallow function and  was able to advance to dysphagia 3 solids and thin liquids with minimal overt s/s of aspiration with min verbal cues for use of swallowing compensatory strategies/attention to bolus and Max encouragement for PO intake. Intake continues to be poor. Patient and family education is ongoing. Patient would benefit from continued skilled SLP intervention to maximize cognitive functioning and overall functional independence prior to discharge.   Intensity: Minumum of 1-2 x/day, 30 to 90 minutes Frequency: 3 to 5 out of 7 days Duration/Length of Stay: TBD due to SNF placement Treatment/Interventions: Cognitive remediation/compensation;Dysphagia/aspiration precaution training;Speech/Language facilitation;Internal/external aids;Cueing hierarchy;Environmental controls;Therapeutic Activities;Functional tasks;Patient/family education   Daily Session Skilled Therapeutic Interventions: Skilled ST treatment focused on swallowing and cognitive goals. Pt was sleeping on arrival but roused to greeting + name. Pt remained alert for duration of session, although continuing to exhibit language of confusion and significant internal distractibility. Pt perseverated on wanting to eat peanuts. SLP provided education on diet textures and no peanuts on unit. SLP provided pt with a fig newton bar as part of dysphagia 3 PO trials. Pt held bar in left hand and performed self feeding with intermittent physical assist to adjust grip. Pt initiated self feeding and required intermittent cueing to persist to this task. From an oral standpoint, pt exhibited slightly prolonged mastication, min oral residuals, and without overt s/sx of aspiration. Pt consumed sips of thin liquid through straw without overt s/sx of aspiration. SLP recommends dit advancement to dysphagia 3 textures and thin liquids at this time. Discussed this with pt to which he responded "I want a cheeseburger." Pt was able to verbalize food preferences through a series  of  yes/no questions with min A verbal cues for attention and clarification. SLP ordered pt's dinner including a cheeseburger, chicken noodle soup, peas, and a sugar cookie. Notified pt's nurse and NT regarding diet advancement. SLP also spoke with NT regarding placing bite sized food items in pt's hand to see if this improves initiation of self feeding, as pt appeared to consume more when he was holding the item as compared to when SLP attempted to provide total A. Patient was left in bed with soft mits in place, alarm activated, and immediate needs within reach at end of session. Continue per current plan of care.      General    Pain  None  Therapy/Group: Individual Therapy  Patty Sermons 02/23/2022, 4:49 PM

## 2022-02-23 NOTE — Progress Notes (Signed)
Physical Therapy Note  Patient Details  Name: Dominic Smith MRN: [id number] Date of Birth: August 05, 1940 Today's Date: 02/23/2022    PT unable to come at scheduled time due to a scheduling conflict. Returned to make up missed time in the afternoon. Patient asleep and slow to arouse. Patient unable to keep eyes open while PT asked orientation questions and declined mobility or other skilled interventions at this time due to fatigue. Patient missed 30 min of skilled PT due to fatigue, RN made aware. Will attempt to make-up missed time as able.     Tyion Boylen L Rustin Erhart PT, DPT, NCS, CBIS  02/23/2022, 4:36 PM

## 2022-02-23 NOTE — Progress Notes (Signed)
Occupational Therapy Weekly Progress Note  Patient Details  Name: Dominic Smith MRN: [id number] Date of Birth: 04-02-1941  Beginning of progress report period: February 17, 2022 End of progress report period: January 23, 2022  Today's Date: 02/23/2022 OT Individual Time: 1305-1330 OT Individual Time Calculation (min): 25 min    STGs had been set to LTG d/t hopeful DC to SNF, however pt remains at IPR setting on a QD schedule. Pt has made little to no progress this reporting period limited by lethargy, decreased nutritional intake and severly impaired cognition. 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 Pt remains only orieted to self at a rancho 5 level. Pt has not been able ot initiate OOB mobility the end of this reporting period requiring MAX-Total A for bed mobility, transfers and ADLs.   Patient continues to demonstrate the following deficits: muscle weakness, decreased cardiorespiratoy endurance, impaired timing and sequencing, unbalanced muscle activation, decreased coordination, and decreased motor planning, decreased visual acuity and decreased visual perceptual skills, decreased midline orientation and ideational apraxia, decreased initiation, decreased attention, decreased awareness, decreased problem solving, decreased safety awareness, decreased memory, delayed processing, and demonstrates behaviors consistent with Rancho Level 5, and decreased sitting balance, decreased standing balance, decreased postural control, decreased balance strategies, and difficulty maintaining precautions and therefore will continue to benefit from skilled OT intervention to enhance overall performance with BADL and Reduce care partner burden.  Patient progressing toward long term goals..  Continue plan of care.  OT Short Term Goals Week 5:  OT Short Term Goal 1 (Week 5): STG=LTG d/t ELOS Week 6:  OT  Short Term Goal 1 (Week 6): Pt will self feed 30 % of meals OT Short Term Goal 2 (Week 6): Pt will initate sit to stand 25% of offerings OT Short Term Goal 3 (Week 6): Pt will transfer wiht MOD A to power up to standing  Skilled Therapeutic Interventions/Progress Updates:    1:1. Pt received in bed with son present. Pt pleasantly confused throughout. Pt son asking about eating, prognosis and CLOF. Discussed current decline, poor PO intake, and sever cognitive impairments. Pt son making statements to pt, "dad if you dont start eating they will have to put a tube in your stomach." This clinician tried to be direct about profound cognitive impairments taking so long to improve likely is not a good prognosticator for long term recovery for independence with BADLs. Feel a family meeting would be beneficial for setting goals of care. Attempted to have pt transfer into chair but pt totla A sup>sit and resisting transfer to TIS therefore laid back down. Exited session with pt seated in bed, exit alarm on and call light in reach   Therapy Documentation Precautions:  Precautions Precautions: Fall, Cervical Precaution Booklet Issued: No Precaution Comments: cortrak, bilateral soft mitts Required Braces or Orthoses: Sling, Cervical Brace Cervical Brace: At all times, Hard collar Restrictions Weight Bearing Restrictions: Yes RUE Weight Bearing: Non weight bearing Other Position/Activity Restrictions: in sling, nonoperative mgmt per ortho 9/18 General:   Therapy/Group: Individual Therapy  [name] 02/23/2022, 2:56 PM

## 2022-02-23 NOTE — Progress Notes (Addendum)
PROGRESS NOTE   Subjective/Complaints:  Pt now in regular bed. No concerns elicited. Seen by nutrition, continue to have variable PO intake.    ROS: limited by cognition  Objective:   No results found.  Recent Labs    02/21/22 0600  WBC 8.4  HGB 10.6*  HCT 33.0*  PLT 268       Recent Labs    02/21/22 0600  NA 137  K 3.8  CL 109  CO2 22  GLUCOSE 109*  BUN 18  CREATININE 1.03  CALCIUM 8.8*       Intake/Output Summary (Last 24 hours) at 02/23/2022 1036 Last data filed at 02/23/2022 0502 Gross per 24 hour  Intake --  Output 2300 ml  Net -2300 ml       Pressure Injury 01/13/22 Coccyx Mid Unstageable - Full thickness tissue loss in which the base of the injury is covered by slough (yellow, tan, gray, green or brown) and/or eschar (tan, brown or black) in the wound bed. skin is broken with redness at the (Active)  01/13/22 1930  Location: Coccyx  Location Orientation: Mid  Staging: Unstageable - Full thickness tissue loss in which the base of the injury is covered by slough (yellow, tan, gray, green or brown) and/or eschar (tan, brown or black) in the wound bed.  Wound Description (Comments): skin is broken with redness at the base  Present on Admission: Yes (present on admission to rehab 9/27)    Physical Exam: Vital Signs Blood pressure (!) 137/106, pulse 73, temperature 98.4 F (36.9 C), temperature source Oral, resp. rate 16, height 5\' 11"  (1.803 m), weight 56.3 kg, SpO2 100 %.   General: No acute distress, appears comfortable. In regular bed now. Mood and affect are appropriate Heart: Regular rate and rhythm no rubs murmurs or extra sounds Lungs: Clear to auscultation, breathing unlabored, no rales or wheezes Abdomen: Positive bowel sounds, soft nontender to palpation, nondistended Extremities: No clubbing, cyanosis, or edema Skin: wounds at knees, both hands, left elbow, right ankle.  Small  coccyx wound present, minimal drainage, appears decreased in size from last week   Neuro: Eyes closed, Speech clear. Follows simple commands intermittently. Oriented to self  and hospital today Confused with no agitation.  Moves all 4 limbs spontaneously but difficut to grade.  Appears to fatigue easy Musculoskeletal: no focal jt pain, nl prom. Right arm in sling    Assessment/Plan: 1. Functional deficits which require 3+ hours per day of interdisciplinary therapy in a comprehensive inpatient rehab setting. Physiatrist is providing close team supervision and 24 hour management of active medical problems listed below. Physiatrist and rehab team continue to assess barriers to discharge/monitor patient progress toward functional and medical goals  Care Tool:  Bathing        Body parts bathed by helper: Right arm, Left arm, Chest, Abdomen, Front perineal area, Buttocks, Right upper leg, Left upper leg, Right lower leg, Left lower leg, Face     Bathing assist Assist Level: 2 Helpers     Upper Body Dressing/Undressing Upper body dressing   What is the patient wearing?: Pull over shirt    Upper body assist Assist Level: Dependent -  Patient 0%    Lower Body Dressing/Undressing Lower body dressing            Lower body assist Assist for lower body dressing: 2 Helpers     Toileting Toileting    Toileting assist Assist for toileting: Moderate Assistance - Patient 50 - 74%     Transfers Chair/bed transfer  Transfers assist     Chair/bed transfer assist level: Moderate Assistance - Patient 50 - 74%     Locomotion Ambulation   Ambulation assist   Ambulation activity did not occur: Safety/medical concerns  Assist level: Moderate Assistance - Patient 50 - 74% Assistive device: Hand held assist Max distance: 180   Walk 10 feet activity   Assist  Walk 10 feet activity did not occur: Safety/medical concerns  Assist level: Moderate Assistance - Patient - 50 -  74% Assistive device: Hand held assist   Walk 50 feet activity   Assist Walk 50 feet with 2 turns activity did not occur: Safety/medical concerns  Assist level: Moderate Assistance - Patient - 50 - 74% Assistive device: Hand held assist    Walk 150 feet activity   Assist Walk 150 feet activity did not occur: Safety/medical concerns  Assist level: Moderate Assistance - Patient - 50 - 74% Assistive device: Hand held assist    Walk 10 feet on uneven surface  activity   Assist Walk 10 feet on uneven surfaces activity did not occur: Safety/medical concerns         Wheelchair     Assist Is the patient using a wheelchair?: Yes Type of Wheelchair: Manual    Wheelchair assist level: Dependent - Patient 0% (TIS)      Wheelchair 50 feet with 2 turns activity    Assist    Wheelchair 50 feet with 2 turns activity did not occur: Safety/medical concerns       Wheelchair 150 feet activity     Assist  Wheelchair 150 feet activity did not occur: Safety/medical concerns       Blood pressure (!) 137/106, pulse 73, temperature 98.4 F (36.9 C), temperature source Oral, resp. rate 16, height 5\' 11"  (1.803 m), weight 56.3 kg, SpO2 100 %.  Medical Problem List and Plan: 1. 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   -minimal/slow progress d/t waxing and waning behavior which is due to premorbid dementia, age, and TBI  -Behavior constant Rachos level V  Con't CIR- PT, OT and SLP- looking at SNF  -Adjusted to QD 2.  Antithrombotics: -DVT/anticoagulation:  Pharmaceutical: Lovenox 30 mg BID             -antiplatelet therapy: aspirin 81 mg daily 3. Pain Management: continue Tylenol per tube scheduled,\; oxycodone, Robaxin prn  10/23 Pt denies pain this AM 4. Mood/Behavior/Sleep: LCSW to evaluate and provide emotional support             -antipsychotic agents:  risperdal             -probably mild baseline dementia (ex-wife confirmed this)  -continue sleep chart   -10/20 did better in enclosure bed --continue   -continue risperdal at lunch and in evening   -minimize any other neurosedating meds   -remove NGT  10/26 decreased risperdal to 0.5mg  HS yesterday, will stop noon dose  10/27 will increase ritalin dose  to 15mg  BID  10/31 Will stop risperdal 5. Neuropsych/cognition: This patient is not capable of making decisions on his own behalf.               6. Skin/Wound Care: routine skin care checks             -monitor scalp lac/skin abrasions             -appreciate WOC recs for unstageable sacral/coccyx pressure wound   -continue medihoney/foam dressing 7. Fluids/Electrolytes/Nutrition/dysphagia: Strict Is and Os and follow-up chemistries             -MBS --diet initiated D2/thins  10/12-decided to keep cortrak in for now, decreased HS TF  -pt appears to have eaten a little more this morning  -discussed PO intake with pt. Demonstrated SOME insight  -BMET WNL  -added megace to help stimulate appetite on 10/12  -10/20-ate well again for second consecutive day   -will dc cortrak   -add IVF at HS   -he's not on many medications fortunately    -needs risperdal for behavior--use disintegrating form -check BMET 10/21  10/22- BUN up to 32- will increase night time IVFs to 75 cc /hour for 12 hours/night 10/30 BUN and Cr stable -10/31 Decreased intake yesterday, ate better day before, Will consult dietician -11/1 Megace Spoke with Son who is agreeable to PEG placement, Will place IR order   8: Right distal clavicle and humerus fracture:    -10/25 Xrays reviewed by ortho today, continue sling and NWB RUE recommended.  F/u Dr. Marcelino Scot 3-4 weeks  18: Elevated transaminases: trending downward  -AST 28 and ALT 34 10/6, improved 19:  ?aspiration pneumonia w/x hx of staph and pseudomonas - vancomycin and cefepime total of 7 more days ended  10/5  -10/30 WBC stable at 8.4 20: SVT/sinus tach intermittently: monitor 21: Mediastinal hematoma: Echo WNL; Hgb stable 22: Ankle edema and ecchymosis: x-rays negative for osseous abnormality           Continue TEDS, elevation in bed 23. Loose stools improved. Had been constipated.   -11/1 LBM yesterday, appears to be having regular BMs 24. Normocytic anemia/ABLA: likely related to hospitalization and chronic disease  -no gross blood loss  continue b complex and iron  -10/30 HGB improved to 10.6 25. Hypoglycemia:     -he's not diabetic. Dc'ed cbg checks.  26. Hypokalemia  -10/30 Stable at K+ 3.8 27. HTN  -10/31 Started metoprolol 25mg  BID yesterday  -11/1 BP appears to be improved, continue to monitor    LOS: 35 days A FACE TO [place] 02/23/2022, 10:36 AM

## 2022-02-23 NOTE — Progress Notes (Signed)
Nutrition Quick Note:   Calorie count discontinued. RD followed up to collect calorie count ticket today. Only one ticket in the folder that states that the pt only ate 3-4 bites of his lunch yesterday. Per record, pt refused his dinner tray yesterday. Discussed with MD that the pt is likely to not meet his nutritional needs with PO intakes alone. MD states that the son would like to proceed with PEG placement. No date set for PEG at this time. Re-consult for when PEG is placed for TF orders.   RD will continue to monitor POC.   Thalia Bloodgood, RD, LDN, CNSC.

## 2022-02-23 NOTE — Progress Notes (Signed)
  Order seen for Gastrostomy tube placement.   CT scan reviewed by Dr. Denna Haggard.   The colon lies right in front of the stomach.   There is no percutaneous window to allow for safe placement for gastrostomy tube.   Please consult General Surgery for placement.  Murrell Redden PA-C 02/23/2022 3:43 PM

## 2022-02-24 DIAGNOSIS — S069X9S Unspecified intracranial injury with loss of consciousness of unspecified duration, sequela: Secondary | ICD-10-CM

## 2022-02-24 DIAGNOSIS — E46 Unspecified protein-calorie malnutrition: Secondary | ICD-10-CM

## 2022-02-24 NOTE — Progress Notes (Signed)
Physical Therapy Weekly Progress Note  Patient Details  Name: Dominic Smith MRN: [id number] Date of Birth: [birth year]  Beginning of progress report period: February 18, 2022 End of progress report period: February 24, 2022  Today's Date: 02/24/2022 PT Individual Time: 1415-1445 PT Individual Time Calculation (min): 30 min   STGs had been set to LTG d/t hopeful DC to SNF, however pt remains at IPR setting on a QD schedule. Pt has made little to no progress this reporting period limited by lethargy, decreased nutritional intake and severly impaired cognition. 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 Pt remains only orieted to self at a rancho 5 level. Pt has not been able ot initiate OOB mobility requiring Max-Total A for bed mobility and transfers until today. With improved nutritional intake x2 days, patient with increased arousal and initiation with transfers and gait at mod-max A level of assist today.     Patient continues to demonstrate the following deficits muscle weakness and muscle joint tightness, decreased cardiorespiratoy endurance, impaired timing and sequencing, motor apraxia, ataxia, decreased coordination, and decreased motor planning, ideational apraxia, decreased initiation, decreased attention, decreased awareness, decreased problem solving, decreased safety awareness, decreased memory, delayed processing, and demonstrates behaviors consistent with Rancho Level V, and decreased sitting balance, decreased standing balance, decreased postural control, decreased balance strategies, and difficulty maintaining precautions and therefore will continue to benefit from skilled PT intervention to increase functional independence with mobility.  Patient  with limited progress toward LTGs this reporting period .  Plan of care revisions: adjusted patient to QD schedule for improved tolerance  with therapy and plan for d/c to SNF when patient is medically and behaviorally appropriate.  PT Short Term Goals Week 5:  PT Short Term Goal 1 (Week 5): STG = LTG d/t ELOS Week 6:  PT Short Term Goal 1 (Week 6): STG=LTG due to ELOS  Skilled Therapeutic Interventions/Progress Updates:     Patient restless sitting at nurses station attempting to figure out how to unlock the w/c breaks per patient report upon PT arrival. Patient alert and verbose with language of confusion throughout session and agreeable to PT session. Patient denied pain during session.  Therapeutic Activity: Bed Mobility: Patient performed sit to supine with max-total A, initially patient initiated lying at foot of bed without assist, however, once cued to return to sitting to lay at the [facility] patient with reduced motor planning and unable to initiate lying on his R side requiring additional assist. Transfers: Patient performed sit to/from stand x1 with mod-max A and max cuing and increased time for initiation and sequencing for motor planning.   Gait Training:  Patient ambulated >150 feet with mod-max A and increased posterior bias holding IV pole with his L hand for support with PT providing facilitation for forward progression using IV pole. Ambulated with decreased gait speed, shuffling gait, very narrow BOS, increased PF in stance resulting in posterior bias, forward trunk flexion, and intermittent downward head gaze. Patient verbose throughout gait training Provided verbal cues for attention to task throughout.  Patient in bed at end of session with breaks locked, B hand mitts donned, bed alarm set to mod setting, 4 rails up for safety, B floor mats in place, and all needs within reach.   Therapy Documentation Precautions:  Precautions Precautions: Fall, Cervical Precaution Booklet Issued: No Precaution Comments: cortrak, bilateral soft mitts Required Braces or Orthoses: Sling, Cervical Brace Cervical Brace:  At all  times, Hard collar Restrictions Weight Bearing Restrictions: Yes RUE Weight Bearing: Non weight bearing Other Position/Activity Restrictions: in sling, nonoperative mgmt per ortho 9/18   Therapy/Group: Individual Therapy  Keeya Dyckman L Cassell Voorhies PT, DPT, NCS, CBIS  02/24/2022, 6:15 PM

## 2022-02-24 NOTE — Progress Notes (Signed)
PROGRESS NOTE   Subjective/Complaints:  Pt without new issues. Seems comfortable in bed, mittens on.   ROS: Limited due to cognitive/behavioral   Objective:   No results found.  No results for input(s): "WBC", "HGB", "HCT", "PLT" in the last 72 hours.    No results for input(s): "NA", "K", "CL", "CO2", "GLUCOSE", "BUN", "CREATININE", "CALCIUM" in the last 72 hours.    Intake/Output Summary (Last 24 hours) at 02/24/2022 [id number] Last data filed at 02/24/2022 0630 Gross per 24 hour  Intake 260 ml  Output 3600 ml  Net -3340 ml      Pressure Injury 01/13/22 Coccyx Mid Unstageable - Full thickness tissue loss in which the base of the injury is covered by slough (yellow, tan, gray, green or brown) and/or eschar (tan, brown or black) in the wound bed. skin is broken with redness at the (Active)  01/13/22 1930  Location: Coccyx  Location Orientation: Mid  Staging: Unstageable - Full thickness tissue loss in which the base of the injury is covered by slough (yellow, tan, gray, green or brown) and/or eschar (tan, brown or black) in the wound bed.  Wound Description (Comments): skin is broken with redness at the base  Present on Admission: Yes (present on admission to rehab 9/27)    Physical Exam: Vital Signs Blood pressure (!) 178/71, pulse 84, temperature 98.3 F (36.8 C), resp. rate 18, height 5\' 11"  (1.803 m), weight 56.2 kg, SpO2 98 %.   Constitutional: No distress . Vital signs reviewed. Wearing mittens HEENT: NCAT, EOMI, oral membranes moist Neck: supple Cardiovascular: RRR without murmur. No JVD    Respiratory/Chest: CTA Bilaterally without wheezes or rales. Normal effort    GI/Abdomen: BS +, non-tender, non-distended Ext: no clubbing, cyanosis, or edema Psych: pleasant and cooperative  Skin: wounds at knees, both hands, left elbow, right ankle.  Small coccyx wound present, minimal drainage, appears decreased in size  from last week   Neuro: Eyes closed, Speech clear. Follows simple commands intermittently. Oriented to self  and hospital today Confused with no agitation.  Moves all 4 limbs spontaneously but difficut to grade.  Appears to fatigue easy Musculoskeletal: no focal jt pain, nl prom. Right arm moving more freely    Assessment/Plan: 1. Functional deficits which require 3+ hours per day of interdisciplinary therapy in a comprehensive inpatient rehab setting. Physiatrist is providing close team supervision and 24 hour management of active medical problems listed below. Physiatrist and rehab team continue to assess barriers to discharge/monitor patient progress toward functional and medical goals  Care Tool:  Bathing        Body parts bathed by helper: Right arm, Left arm, Chest, Abdomen, Front perineal area, Buttocks, Right upper leg, Left upper leg, Right lower leg, Left lower leg, Face     Bathing assist Assist Level: 2 Helpers     Upper Body Dressing/Undressing Upper body dressing   What is the patient wearing?: Pull over shirt    Upper body assist Assist Level: Dependent - Patient 0%    Lower Body Dressing/Undressing Lower body dressing            Lower body assist Assist for lower body dressing: 2  Helpers     Toileting Toileting    Toileting assist Assist for toileting: Moderate Assistance - Patient 50 - 74%     Transfers Chair/bed transfer  Transfers assist     Chair/bed transfer assist level: Moderate Assistance - Patient 50 - 74%     Locomotion Ambulation   Ambulation assist   Ambulation activity did not occur: Safety/medical concerns  Assist level: Moderate Assistance - Patient 50 - 74% Assistive device: Hand held assist Max distance: 180   Walk 10 feet activity   Assist  Walk 10 feet activity did not occur: Safety/medical concerns  Assist level: Moderate Assistance - Patient - 50 - 74% Assistive device: Hand held assist   Walk 50 feet  activity   Assist Walk 50 feet with 2 turns activity did not occur: Safety/medical concerns  Assist level: Moderate Assistance - Patient - 50 - 74% Assistive device: Hand held assist    Walk 150 feet activity   Assist Walk 150 feet activity did not occur: Safety/medical concerns  Assist level: Moderate Assistance - Patient - 50 - 74% Assistive device: Hand held assist    Walk 10 feet on uneven surface  activity   Assist Walk 10 feet on uneven surfaces activity did not occur: Safety/medical concerns         Wheelchair     Assist Is the patient using a wheelchair?: Yes Type of Wheelchair: Manual    Wheelchair assist level: Dependent - Patient 0% (TIS)      Wheelchair 50 feet with 2 turns activity    Assist    Wheelchair 50 feet with 2 turns activity did not occur: Safety/medical concerns       Wheelchair 150 feet activity     Assist  Wheelchair 150 feet activity did not occur: Safety/medical concerns       Blood pressure (!) 178/71, pulse 84, temperature 98.3 F (36.8 C), resp. rate 18, height 5\' 11"  (1.803 m), weight 56.2 kg, SpO2 98 %.  Medical Problem List and Plan: 1. 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   - Ranchos level V  -Continue CIR therapies including PT, OT, and SLP   -Adjusted to QD 2.  Antithrombotics: -DVT/anticoagulation:  Pharmaceutical: Lovenox 30 mg BID             -antiplatelet therapy: aspirin 81 mg daily 3. Pain Management: continue Tylenol per tube scheduled,\; oxycodone, Robaxin prn  10/23 Pt denies pain this AM 4. 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 Neuropsych/cognition: This patient  is not capable of making decisions on his own behalf.               6. Skin/Wound Care: routine skin care checks             -monitor scalp lac/skin abrasions             -appreciate WOC recs for unstageable sacral/coccyx pressure wound   -continue medihoney/foam dressing 7. Fluids/Electrolytes/Nutrition/dysphagia: Strict Is and Os and follow-up chemistries             -  MBS --diet initiated D2/thins  10/12-decided to keep cortrak in for now, decreased HS TF  -pt appears to have eaten a little more this morning  -discussed PO intake with pt. Demonstrated SOME insight  -BMET WNL  -added megace to help stimulate appetite on 10/12   -11/2 still not eating or drinking enough consistently. Son has agreed to PEG. IR cannot place as colon is in front of stomach. Will request surgery consult.   8: Right distal clavicle and humerus fracture:    -10/25 Xrays reviewed by ortho today, continue sling and NWB RUE recommended.  F/u Dr. Marcelino Scot 3-4 weeks  18: Elevated transaminases: trending downward  -AST 28 and ALT 34 10/6, improved 19:  ?aspiration pneumonia w/x hx of staph and pseudomonas - vancomycin and cefepime total of 7 more days ended 10/5  -10/30 WBC stable at 8.4 20: SVT/sinus tach intermittently: monitor 21: Mediastinal hematoma: Echo WNL; Hgb stable 22: Ankle edema and ecchymosis: x-rays negative for osseous abnormality           Continue TEDS, elevation in bed 23. Loose stools improved. Had been constipated.   - LBM 11/2, appears to be having regular BMs 24. Normocytic anemia/ABLA: likely related to hospitalization and chronic disease  -no gross blood loss  continue b complex and iron  -10/30 HGB improved to 10.6 25. Hypoglycemia:     -he's not diabetic. Dc'ed cbg checks.  26. Hypokalemia  -10/30 Stable at K+ 3.8 27. HTN  -10/31 Started metoprolol 25mg  BID yesterday  -11/2 BP appears to be improved since addition despite this am's reading    LOS: 36 days A FACE TO [place] 02/24/2022, 9:06 AM

## 2022-02-24 NOTE — Progress Notes (Signed)
Call placed to general surgery on-call regarding g-tube placement.

## 2022-02-24 NOTE — Progress Notes (Signed)
Patient discontinued his foley by breaking the tubing, enough remained for writer to remove without trauma to the patient. S.Setzer, PA notified.

## 2022-02-24 NOTE — Progress Notes (Signed)
Occupational Therapy TBI Note  Patient Details  Name: Dominic Smith MRN: [id number] Date of Birth: January 31, 1941  Today's Date: 02/24/2022 OT Individual Time: 0935-1000 OT Individual Time Calculation (min): 25 min    Short Term Goals: Week 1:  OT Short Term Goal 1 (Week 1): Pt will consistently transfer to toilet with 1 caregiver OT Short Term Goal 1 - Progress (Week 1): Met OT Short Term Goal 2 (Week 1): Pt will complete 1/4 steps of donning shirt OT Short Term Goal 2 - Progress (Week 1): Progressing toward goal OT Short Term Goal 3 (Week 1): Pt will complete oral care with MOD A OT Short Term Goal 3 - Progress (Week 1): Progressing toward goal OT Short Term Goal 4 (Week 1): Pt will sit to stand with MOD A or less OT Short Term Goal 4 - Progress (Week 1): Met  Skilled Therapeutic Interventions/Progress Updates:     Pt received in bed with no pain.  ADL: Pt much more awake, alert and able to follow commands. Pt reporting he is hungry and eats 2 greek yogurt cups with only 1 instance of telling pt to take smaller bites. Pt then able ot intiate supine>sit but overall max A for trunk elevation and MOD A for stand pivot transfer to w/c after donning teds. Pt continues to demo poor motor planning once in semi stand and inability to folow commands for hand placement.   Pt left at end of session in TIS at RN station with exit alarm on, call light in reach and all needs met   Therapy Documentation Precautions:  Precautions Precautions: Fall, Cervical Precaution Booklet Issued: No Precaution Comments: cortrak, bilateral soft mitts Required Braces or Orthoses: Sling, Cervical Brace Cervical Brace: At all times, Hard collar Restrictions Weight Bearing Restrictions: Yes RUE Weight Bearing: Non weight bearing Other Position/Activity Restrictions: in sling, nonoperative mgmt per ortho 9/18 General:    Agitated Behavior Scale: TBI ABS discontinued d/t ABS score less than 20 for the last  three days or no behaviors present        Therapy/Group: Individual Therapy  [name] 02/24/2022, 6:52 AM

## 2022-02-25 MED ORDER — TAMSULOSIN HCL 0.4 MG PO CAPS
0.4000 mg | ORAL_CAPSULE | Freq: Every day | ORAL | Status: DC
  Administered 2022-02-25 – 2022-03-07 (×11): 0.4 mg via ORAL
  Filled 2022-02-25 (×11): qty 1

## 2022-02-25 NOTE — Progress Notes (Signed)
Physical Therapy TBI Note  Patient Details  Name: Dominic Smith MRN: [id number] Date of Birth: 1940/09/28  Today's Date: 02/25/2022 PT Individual Time: 0630-1601 PT Individual Time Calculation (min): 29 min   Short Term Goals: Week 1:  PT Short Term Goal 1 (Week 1): pt will initiate gait training PT Short Term Goal 1 - Progress (Week 1): Met PT Short Term Goal 2 (Week 1): Pt will participate in 1 outcome measure PT Short Term Goal 2 - Progress (Week 1): Met PT Short Term Goal 3 (Week 1): Pt will sit OOB between therapy sessions. PT Short Term Goal 3 - Progress (Week 1): Met Week 2:  PT Short Term Goal 1 (Week 2): Patient will perform basic transfers with min A >50% of the time using LRAD. PT Short Term Goal 1 - Progress (Week 2): Met PT Short Term Goal 2 (Week 2): Patient will perform bed mobility with min A >50% of the time. PT Short Term Goal 2 - Progress (Week 2): Progressing toward goal PT Short Term Goal 3 (Week 2): Patient will ambulate >200 ft with min A using LRAD. PT Short Term Goal 3 - Progress (Week 2): Met PT Short Term Goal 4 (Week 2): Patient will initiate stair training. PT Short Term Goal 4 - Progress (Week 2): Met Week 3:  PT Short Term Goal 1 (Week 3): Patient will perform bed mobility with min A >50% of the time. PT Short Term Goal 1 - Progress (Week 3): Not met PT Short Term Goal 2 (Week 3): Patient will perform basic transfers with min A consistently. PT Short Term Goal 2 - Progress (Week 3): Not met PT Short Term Goal 3 (Week 3): Patient will ambulate >300 ft with min A without need for additional assist for LOB. PT Short Term Goal 3 - Progress (Week 3): Not met PT Short Term Goal 4 (Week 3): Patient will participate in a formal balance assessment. PT Short Term Goal 4 - Progress (Week 3): Not met Week 4:  PT Short Term Goal 1 (Week 4): Patient will perform bed mobility with min A consistently. PT Short Term Goal 1 - Progress (Week 4): Not met PT Short Term  Goal 2 (Week 4): Patient will perform basic transfers with min A >75% of the time. PT Short Term Goal 2 - Progress (Week 4): Not met PT Short Term Goal 3 (Week 4): Patient will ambulate >200 feet with min A using LRAD without additional assist for LOB. PT Short Term Goal 3 - Progress (Week 4): Not met PT Short Term Goal 4 (Week 4): Patient will perform dynamic standing balance >2 min with min A. PT Short Term Goal 4 - Progress (Week 4): Progressing toward goal Week 5:  PT Short Term Goal 1 (Week 5): STG = LTG d/t ELOS Week 6:     Skilled Therapeutic Interventions/Progress Updates:      Therapy Documentation Precautions:  Precautions Precautions: Fall, Cervical Precaution Booklet Issued: No Precaution Comments: cortrak, bilateral soft mitts Required Braces or Orthoses: Sling, Cervical Brace Cervical Brace: At all times, Hard collar Restrictions Weight Bearing Restrictions: Yes RUE Weight Bearing: Non weight bearing Other Position/Activity Restrictions: in sling, nonoperative mgmt per ortho 9/18   Agitated Behavior Scale: TBI Observation Details Observation Environment: CIR Start of observation period - Date: 02/25/22 Start of observation period - Time: 0945 End of observation period - Date: 02/25/22 End of observation period - Time: 1014 Agitated Behavior Scale (DO NOT LEAVE BLANKS) Short attention span, easy  distractibility, inability to concentrate: Present to a moderate degree Impulsive, impatient, low tolerance for pain or frustration: Absent Uncooperative, resistant to care, demanding: Absent Violent and/or threatening violence toward people or property: Absent Explosive and/or unpredictable anger: Absent Rocking, rubbing, moaning, or other self-stimulating behavior: Absent Pulling at tubes, restraints, etc.: Absent Wandering from treatment areas: Absent Restlessness, pacing, excessive movement: Absent Repetitive behaviors, motor, and/or verbal: Absent Rapid, loud, or  excessive talking: Absent Sudden changes of mood: Absent Easily initiated or excessive crying and/or laughter: Absent Self-abusiveness, physical and/or verbal: Absent Agitated behavior scale total score: 16  Pt received in handoff from OT on unit.  Pt awake, alert, confused. Sit to stand from wc w/tactile facilitation/+2 HHA/guiding to inititate.  Initially post lean but this resolves as pt proceeds w/gait. Gait >23f w/HHA of 2, narrow base gait, decreased step length bilat.  Introduction of conversation/simple cognitive/orientation questions result in significantly slowed gait.  Pt transported to nurses station via TCurticeand provided w/greek yogurt.  Feeds self approx 20% of time w/cues to inititat, requries hand over hand guiding 80% time.  Nursing notified of intake.  Pt left seated in TIS w/chair tilted partially for safety and alarm belt on/armed.     Therapy/Group: Individual Therapy BCallie Fielding PT   BJerrilyn Cairo11/06/2021, 12:26 PM

## 2022-02-25 NOTE — Consult Note (Signed)
Consultation: Urinary retention, difficult Foley  History of Present Illness: Dominic Smith is an 81 year old male with a traumatic brain injury.  He is in rehab and had some confusion.  He traumatically removed his Foley catheter and nurses could not replace it.  They tried a 66 French catheter and a 16 [place] coud.  Bladder scan was 800 cc and patient complaining of need to urinate but cannot.  No known GU history.  CT scan in September 2023 showed a normal prostate without significant BPH.    History reviewed. No pertinent past medical history. History reviewed. No pertinent surgical history.  Home Medications:  No medications prior to admission.   Allergies: No Known Allergies  History reviewed. No pertinent family history. Social History:  reports that he has never smoked. He has never used smokeless tobacco. He reports that he does not drink alcohol and does not use drugs.  ROS: A complete review of systems was performed.  All systems are negative except for pertinent findings as noted. Review of Systems  Unable to perform ROS: Mental acuity     Physical Exam:  Vital signs in last 24 hours: Temp:  [97.8 F (36.6 C)-98.6 F (37 C)] 97.8 F (36.6 C) (11/03 0408) Pulse Rate:  [72-88] 72 (11/03 0408) Resp:  [15-18] 18 (11/03 0408) BP: (123-170)/(68-76) 155/76 (11/03 0408) SpO2:  [100 %] 100 % (11/03 0408) General:  Alert. No acute distress. Patient tells me he needs to pee.  Tells me he needs to get up and pee.  Trying to clutch at bladder but restrained. HEENT: Normocephalic, atraumatic Cardiovascular: Regular rate and rhythm Lungs: Regular rate and effort Abdomen: Soft, nontender, nondistended, no abdominal masses, bladder palpably distended. Back: No CVA tenderness Extremities: No edema Neurologic: Grossly intact GU: Glans and meatus appear normal.  No blood per meatus.  Scrotum appears normal.  Procedure: Penis was prepped and draped in the usual sterile fashion.  An 87  French coud catheter was passed without difficulty.  Urine was clear.  Catheter left to gravity drainage.  Laboratory Data:  No results found for this or any previous visit (from the past 24 hour(s)). Recent Results (from the past 240 hour(s))  MRSA Next Gen by PCR, Nasal     Status: Abnormal   Collection Time: 02/18/22 10:45 PM   Specimen: Nasal Mucosa; Nasal Swab  Result Value Ref Range Status   MRSA by PCR Next Gen DETECTED (A) NOT DETECTED Final    Comment: RESULT CALLED TO, READ BACK BY AND VERIFIED WITH: J MURRAY,RN@0017  02/19/22 [place] (NOTE) The GeneXpert MRSA Assay (FDA approved for NASAL specimens only), is one component of a comprehensive MRSA colonization surveillance program. It is not intended to diagnose MRSA infection nor to guide or monitor treatment for MRSA infections. Test performance is not FDA approved in patients less than 76 years old. Performed at [facility], [place] [address] [place], [place] [address]    Creatinine: Recent Labs    02/18/22 1230 02/21/22 0600  CREATININE 1.19 1.03    Impression/Assessment:  Urinary retention-  Plan:  Continue Foley catheter per primary team.  I would recommend starting patient on tamsulosin as this might optimize chance for success at his voiding trial/Foley removal.  Please page GU on-call with any questions, concerns or changes in patient's status.  Festus Aloe 02/25/2022, 7:28 AM

## 2022-02-25 NOTE — Progress Notes (Addendum)
Speech Language Pathology Daily Session Note  Patient Details  Name: Dominic Smith MRN: [id number] Date of Birth: 12/27/1940  Today's Date: 02/25/2022 SLP Individual Time: 6578-4696 SLP Individual Time Calculation (min): 34 min  Short Term Goals: Week 6: SLP Short Term Goal 1 (Week 6): Patient will consume current diet with minimal overt s/s of aspiration and sup verbal cues for use of swallowing compensatory strategies. - Pt consumed ~85% of [place] bar with intermittent MOD A for self-feeding via spoon and self-administration of thin liquid via straw. Subtle throat clear noted x 1 with serial sips of thin liquid via straw; benefited from MOD A verbal and tactile cues for taking small + single sips of liquid at a time. With implementation of single + small sips of thin liquid and accepting one bite at a time. No additional s/sx concerning for pharyngeal dysphagia noted. Oral phase appeared functional for Dysphagia 3 textures.   SLP Short Term Goal 2 (Week 6): Patient will orient to place, month and situation with Max A multimodal cues. - Pt oriented to place with MIN-MOD A + use of external aid and MAX to TOTAL A for date with use of calendar.  SLP Short Term Goal 3 (Week 6): Patient will demonstate sustained attenton to functional tasks for 7 minutes with Max A multimodal cues for redirection. - Pt demonstrated sustained attention to functional task (counting days of the month 1-30) for ~3 minutes with MAX A multinodal cues for participation.  Skilled Therapeutic Interventions: S: Pt seen this date for skilled ST intervention targeting swallowing and cognitive goals outlined above. Pt received in TIS, sleeping at RN station; aroused to name. Denies pain. Agreeable to ST intervention in hospital room. Participatory with min encouragement. Remains confused and disoriented x 3 (only oriented to self).  O: Please see above for objective data re: pt's performance on targeted goals. Pt education  limited due to severity of cognitive deficits; family not present. Continues to benefit from intermittent MOD-MAX A for motor initiation, particularly for self-feeding, though cues were faded to SUP A as task progressed. Overall MOD A for command following within functional context.  A: Pt appears minimally sitmulable for skilled ST intervention as evident by poor recall of information despite Max A and use of errorless learning and spaced retrieval interventions. Recommend continuation of Dysphagia 3 diet textures and thin liquids given 1:1 assistance for safety and intake. Continue to provide external aids when re-orienting, provide gentle tactile and model prompts as needed to follow commands, and allow extra processing time and verbal and/or written choice prompts for communication of wants, needs, and ideas,as needed.  P: Pt left at RN station for safety. Continue per current ST POC next session.   Pain None/Denies; NAD  Therapy/Group: Individual Therapy  Kazimir Hartnett A Jerry Haugen 02/25/2022, 11:59 AM

## 2022-02-25 NOTE — Progress Notes (Signed)
PROGRESS NOTE   Subjective/Complaints:  Pt resting in bed. Appears comfortable. Foley couldn't be replaced overnight after pt pulled out.  Urology replaced.   ROS: Limited due to cognitive/behavioral   Objective:   No results found.  No results for input(s): "WBC", "HGB", "HCT", "PLT" in the last 72 hours.    No results for input(s): "NA", "K", "CL", "CO2", "GLUCOSE", "BUN", "CREATININE", "CALCIUM" in the last 72 hours.    Intake/Output Summary (Last 24 hours) at 02/25/2022 [id number] Last data filed at 02/25/2022 0816 Gross per 24 hour  Intake 90 ml  Output 600 ml  Net -510 ml      Pressure Injury 01/13/22 Coccyx Mid Unstageable - Full thickness tissue loss in which the base of the injury is covered by slough (yellow, tan, gray, green or brown) and/or eschar (tan, brown or black) in the wound bed. skin is broken with redness at the (Active)  01/13/22 1930  Location: Coccyx  Location Orientation: Mid  Staging: Unstageable - Full thickness tissue loss in which the base of the injury is covered by slough (yellow, tan, gray, green or brown) and/or eschar (tan, brown or black) in the wound bed.  Wound Description (Comments): skin is broken with redness at the base  Present on Admission: Yes (present on admission to rehab 9/27)    Physical Exam: Vital Signs Blood pressure (!) 155/76, pulse 72, temperature 97.8 F (36.6 C), resp. rate 18, height 5\' 11"  (1.803 m), weight 56.2 kg, SpO2 100 %.   Constitutional: No distress . Vital signs reviewed. HEENT: NCAT, EOMI, oral membranes moist Neck: supple Cardiovascular: RRR without murmur. No JVD    Respiratory/Chest: CTA Bilaterally without wheezes or rales. Normal effort    GI/Abdomen: BS +, non-tender, non-distended Ext: no clubbing, cyanosis, or edema Psych: pleasantly confused Skin: wounds at knees, both hands, left elbow, right ankle.  Small coccyx wound present, minimal  drainage, appears decreased in size from last week   Neuro: Eyes closed, Speech clear. Follows simple commands intermittently. Oriented to self  and hospital today Confused with no agitation.  Moves all 4 limbs spontaneously but difficut to grade.  Appears to fatigue easy Musculoskeletal: no focal jt pain, nl prom. Right arm moving more freely    Assessment/Plan: 1. Functional deficits which require 3+ hours per day of interdisciplinary therapy in a comprehensive inpatient rehab setting. Physiatrist is providing close team supervision and 24 hour management of active medical problems listed below. Physiatrist and rehab team continue to assess barriers to discharge/monitor patient progress toward functional and medical goals  Care Tool:  Bathing        Body parts bathed by helper: Right arm, Left arm, Chest, Abdomen, Front perineal area, Buttocks, Right upper leg, Left upper leg, Right lower leg, Left lower leg, Face     Bathing assist Assist Level: 2 Helpers     Upper Body Dressing/Undressing Upper body dressing   What is the patient wearing?: Pull over shirt    Upper body assist Assist Level: Dependent - Patient 0%    Lower Body Dressing/Undressing Lower body dressing            Lower body assist Assist for  lower body dressing: 2 Helpers     Toileting Toileting    Toileting assist Assist for toileting: Moderate Assistance - Patient 50 - 74%     Transfers Chair/bed transfer  Transfers assist     Chair/bed transfer assist level: Moderate Assistance - Patient 50 - 74%     Locomotion Ambulation   Ambulation assist   Ambulation activity did not occur: Safety/medical concerns  Assist level: Moderate Assistance - Patient 50 - 74% Assistive device: Hand held assist Max distance: 180   Walk 10 feet activity   Assist  Walk 10 feet activity did not occur: Safety/medical concerns  Assist level: Moderate Assistance - Patient - 50 - 74% Assistive device:  Hand held assist   Walk 50 feet activity   Assist Walk 50 feet with 2 turns activity did not occur: Safety/medical concerns  Assist level: Moderate Assistance - Patient - 50 - 74% Assistive device: Hand held assist    Walk 150 feet activity   Assist Walk 150 feet activity did not occur: Safety/medical concerns  Assist level: Moderate Assistance - Patient - 50 - 74% Assistive device: Hand held assist    Walk 10 feet on uneven surface  activity   Assist Walk 10 feet on uneven surfaces activity did not occur: Safety/medical concerns         Wheelchair     Assist Is the patient using a wheelchair?: Yes Type of Wheelchair: Manual    Wheelchair assist level: Dependent - Patient 0% (TIS)      Wheelchair 50 feet with 2 turns activity    Assist    Wheelchair 50 feet with 2 turns activity did not occur: Safety/medical concerns       Wheelchair 150 feet activity     Assist  Wheelchair 150 feet activity did not occur: Safety/medical concerns       Blood pressure (!) 155/76, pulse 72, temperature 97.8 F (36.6 C), resp. rate 18, height 5\' 11"  (1.803 m), weight 56.2 kg, SpO2 100 %.  Medical Problem List and Plan: 1. 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   - [place] level V  -Continue CIR therapies including PT, OT, and SLP on QD schedule. 2.  Antithrombotics: -DVT/anticoagulation:  Pharmaceutical: Lovenox 30 mg BID             -antiplatelet therapy: aspirin 81 mg daily 3. Pain Management: continue Tylenol per tube scheduled,\; oxycodone, Robaxin prn  10/23 Pt denies pain this AM 4. 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  Neuropsych/cognition: This patient is not capable of making decisions on his own behalf.               6. Skin/Wound Care: routine skin care checks             -monitor scalp lac/skin abrasions             -appreciate WOC recs for unstageable sacral/coccyx pressure wound   -continue medihoney/foam dressing 7. Fluids/Electrolytes/Nutrition/dysphagia: Strict Is and Os and follow-up chemistries             -  MBS --diet initiated D2/thins  10/12-decided to keep cortrak in for now, decreased HS TF  -pt appears to have eaten a little more this morning  -discussed PO intake with pt. Demonstrated SOME insight  -BMET WNL  -added megace to help stimulate appetite on 10/12   -11/2 still not eating or drinking enough consistently. Son has agreed to PEG. IR cannot place as colon is in front of stomach. Have requested surgery consult.   8: Right distal clavicle and humerus fracture:    -10/25 Xrays reviewed by ortho today, continue sling and NWB RUE recommended.  F/u Dr. 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   -will retry flomax again  -appreciate urology help in replacing foley 15: SAH:  completed 7 days of Keppra; follow-up with neurosurgery 16: Volume overload/peripheral edema: improved 18: Elevated transaminases: trending downward  -AST 28 and ALT 34 10/6, improved 19:  ?aspiration pneumonia w/x hx of staph and pseudomonas - vancomycin and cefepime total of 7 more days ended 10/5  -10/30 WBC stable at 8.4 20: SVT/sinus tach intermittently: monitor 21: Mediastinal hematoma: Echo WNL; Hgb stable 22: Ankle edema and ecchymosis: x-rays negative for osseous abnormality           Continue TEDS, elevation in bed 23. Loose stools improved.  Had been constipated.   - LBM 11/2, appears to be having regular BMs 24. Normocytic anemia/ABLA: likely related to hospitalization and chronic disease  -no gross blood loss  continue b complex and iron  -10/30 HGB improved to 10.6 25. Hypoglycemia:     -he's not diabetic. Dc'ed cbg checks.  26. Hypokalemia  -10/30 Stable at K+ 3.8 27. HTN  -10/31 Started metoprolol 25mg  BID yesterday  -  BP appears to be improved      LOS: 37 days A FACE TO FACE EVALUATION WAS PERFORMED  Dominic Smith 02/25/2022, 9:03 AM

## 2022-02-25 NOTE — Progress Notes (Signed)
Occupational Therapy TBI Note  Patient Details  Name: Dominic Smith MRN: [id number] Date of Birth: 08/28/1940  Today's Date: 02/25/2022 OT Individual Time: 1683-7290 OT Individual Time Calculation (min): 26 min    Short Term Goals: Week 1:  OT Short Term Goal 1 (Week 1): Pt will consistently transfer to toilet with 1 caregiver OT Short Term Goal 1 - Progress (Week 1): Met OT Short Term Goal 2 (Week 1): Pt will complete 1/4 steps of donning shirt OT Short Term Goal 2 - Progress (Week 1): Progressing toward goal OT Short Term Goal 3 (Week 1): Pt will complete oral care with MOD A OT Short Term Goal 3 - Progress (Week 1): Progressing toward goal OT Short Term Goal 4 (Week 1): Pt will sit to stand with MOD A or less OT Short Term Goal 4 - Progress (Week 1): Met  Skilled Therapeutic Interventions/Progress Updates:    Pt received in bed with no pain, family exiting as OT entered. Pt remains confused but more able ot follow commands today  ADL: Pt completes ADL at overall MOD A Level. Skilled interventions include: VC for continuation of doffing steps, A to set up clothing oriented to pt for improved donning and MOD A power up sit to stand for clothing maangment and functional transfers. Per RN request coandplaced over IV site to keep pt from pulling at it.  Pt left at end of session in TIS with direct handoff to PT.  Therapy Documentation Precautions:  Precautions Precautions: Fall, Cervical Precaution Booklet Issued: No Precaution Comments: cortrak, bilateral soft mitts Required Braces or Orthoses: Sling, Cervical Brace Cervical Brace: At all times, Hard collar Restrictions Weight Bearing Restrictions: Yes RUE Weight Bearing: Non weight bearing Other Position/Activity Restrictions: in sling, nonoperative mgmt per ortho 9/18 General:    Agitated Behavior Scale: TBI ABS discontinued d/t ABS score less than 20 for the last three days or no behaviors present         Therapy/Group: Individual Therapy  [name] 02/25/2022, 6:50 AM

## 2022-02-25 NOTE — Progress Notes (Signed)
Staff unable to replace Foley catheter. Urology, Dr. Junious Silk, contacted for replacement.

## 2022-02-25 NOTE — Progress Notes (Signed)
Pt bladder scanned for 250ml at 0200 and 655ml at 0430. Notified on call provider Risa Grill (PA).New order for foley received. Foley attempt unsuccessful x2 with charge nurse Mardene Sayer. Per Katharine Look, hold half normal saline for now and consult to urologist. Resident is stable with no adverse reaction.                         E. Adrain Nesbit  R.N

## 2022-02-26 NOTE — Progress Notes (Signed)
PROGRESS NOTE   Subjective/Complaints:  No new issues. Uneventful night.   ROS: Limited due to cognitive/behavioral   Objective:   No results found.  No results for input(s): "WBC", "HGB", "HCT", "PLT" in the last 72 hours.    No results for input(s): "NA", "K", "CL", "CO2", "GLUCOSE", "BUN", "CREATININE", "CALCIUM" in the last 72 hours.    Intake/Output Summary (Last 24 hours) at 02/26/2022 1156 Last data filed at 02/26/2022 0442 Gross per 24 hour  Intake 236 ml  Output 1700 ml  Net -1464 ml      Pressure Injury 01/13/22 Coccyx Mid Unstageable - Full thickness tissue loss in which the base of the injury is covered by slough (yellow, tan, gray, green or brown) and/or eschar (tan, brown or black) in the wound bed. skin is broken with redness at the (Active)  01/13/22 1930  Location: Coccyx  Location Orientation: Mid  Staging: Unstageable - Full thickness tissue loss in which the base of the injury is covered by slough (yellow, tan, gray, green or brown) and/or eschar (tan, brown or black) in the wound bed.  Wound Description (Comments): skin is broken with redness at the base  Present on Admission: Yes (present on admission to rehab 9/27)    Physical Exam: Vital Signs Blood pressure (!) 162/78, pulse 72, temperature 98.9 F (37.2 C), temperature source Oral, resp. rate 16, height 5\' 11"  (1.803 m), weight 58.3 kg, SpO2 100 %.   Constitutional: No distress . Vital signs reviewed. mittens HEENT: NCAT, EOMI, oral membranes moist Neck: supple Cardiovascular: RRR without murmur. No JVD    Respiratory/Chest: CTA Bilaterally without wheezes or rales. Normal effort    GI/Abdomen: BS +, non-tender, non-distended Ext: no clubbing, cyanosis, or edema Psych: pleasantly confused Skin: wounds at knees, both hands, left elbow, right ankle.  Small coccyx wound present, minimal drainage, appears decreased in size from last week    Neuro: speech fairly clear. . Follows simple commands intermittently. Oriented to self  and hospital today Confused with no agitation.  Moves all 4 limbs spontaneously but difficut to grade.  Appears to fatigue easy Musculoskeletal: no focal jt pain, nl prom. Right arm moving more freely    Assessment/Plan: 1. Functional deficits which require 3+ hours per day of interdisciplinary therapy in a comprehensive inpatient rehab setting. Physiatrist is providing close team supervision and 24 hour management of active medical problems listed below. Physiatrist and rehab team continue to assess barriers to discharge/monitor patient progress toward functional and medical goals  Care Tool:  Bathing        Body parts bathed by helper: Right arm, Left arm, Chest, Abdomen, Front perineal area, Buttocks, Right upper leg, Left upper leg, Right lower leg, Left lower leg, Face     Bathing assist Assist Level: 2 Helpers     Upper Body Dressing/Undressing Upper body dressing   What is the patient wearing?: Pull over shirt    Upper body assist Assist Level: Dependent - Patient 0%    Lower Body Dressing/Undressing Lower body dressing            Lower body assist Assist for lower body dressing: 2 Helpers  Toileting Toileting    Toileting assist Assist for toileting: Moderate Assistance - Patient 50 - 74%     Transfers Chair/bed transfer  Transfers assist     Chair/bed transfer assist level: Moderate Assistance - Patient 50 - 74%     Locomotion Ambulation   Ambulation assist   Ambulation activity did not occur: Safety/medical concerns  Assist level: Moderate Assistance - Patient 50 - 74% Assistive device: Hand held assist Max distance: 180   Walk 10 feet activity   Assist  Walk 10 feet activity did not occur: Safety/medical concerns  Assist level: Moderate Assistance - Patient - 50 - 74% Assistive device: Hand held assist   Walk 50 feet activity   Assist  Walk 50 feet with 2 turns activity did not occur: Safety/medical concerns  Assist level: Moderate Assistance - Patient - 50 - 74% Assistive device: Hand held assist    Walk 150 feet activity   Assist Walk 150 feet activity did not occur: Safety/medical concerns  Assist level: Moderate Assistance - Patient - 50 - 74% Assistive device: Hand held assist    Walk 10 feet on uneven surface  activity   Assist Walk 10 feet on uneven surfaces activity did not occur: Safety/medical concerns         Wheelchair     Assist Is the patient using a wheelchair?: Yes Type of Wheelchair: Manual    Wheelchair assist level: Dependent - Patient 0% (TIS)      Wheelchair 50 feet with 2 turns activity    Assist    Wheelchair 50 feet with 2 turns activity did not occur: Safety/medical concerns       Wheelchair 150 feet activity     Assist  Wheelchair 150 feet activity did not occur: Safety/medical concerns       Blood pressure (!) 162/78, pulse 72, temperature 98.9 F (37.2 C), temperature source Oral, resp. rate 16, height 5\' 11"  (1.803 m), weight 58.3 kg, SpO2 100 %.  Medical Problem List and Plan: 1. 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   - [place] level V  -Continue CIR therapies including PT, OT, and SLP on QD schedule. 2.  Antithrombotics: -DVT/anticoagulation:  Pharmaceutical: Lovenox 30 mg BID             -antiplatelet therapy: aspirin 81 mg daily 3. Pain Management: continue Tylenol per tube scheduled,\; oxycodone, Robaxin prn  10/23 Pt denies pain this AM 4. 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 Neuropsych/cognition: This patient  is not capable of making decisions on his own behalf.               6. Skin/Wound Care: routine skin care checks             -monitor scalp lac/skin abrasions             -appreciate WOC recs for unstageable sacral/coccyx pressure wound   -continue medihoney/foam dressing 7. Fluids/Electrolytes/Nutrition/dysphagia: Strict Is and Os and follow-up chemistries             -  MBS --diet initiated D2/thins  10/12-decided to keep cortrak in for now, decreased HS TF  -pt appears to have eaten a little more this morning  -discussed PO intake with pt. Demonstrated SOME insight  -BMET WNL  -added megace to help stimulate appetite on 10/12   -11/2 still not eating or drinking enough consistently. Son has agreed to PEG. IR cannot place as colon is in front of stomach. Have requested surgery consult. I still don't see a consult from them.  8: Right distal clavicle and humerus fracture:    -10/25 Xrays reviewed by ortho today, continue sling and NWB RUE recommended.  F/u Dr. 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   -retrial of flomax    -appreciate urology help in replacing foley 15: SAH:  completed 7 days of Keppra; follow-up with neurosurgery 16: Volume overload/peripheral edema: improved 18: Elevated transaminases: trending downward  -AST 28 and ALT 34 10/6, improved 19:  ?aspiration pneumonia w/x hx of staph and pseudomonas - vancomycin and cefepime total of 7 more days ended 10/5  -10/30 WBC stable at 8.4 20: SVT/sinus tach intermittently: monitor 21: Mediastinal hematoma: Echo WNL; Hgb stable 22: Ankle edema and ecchymosis: x-rays negative for osseous abnormality           Continue TEDS, elevation in bed 23. Loose stools improved.  Had been constipated.   - LBM 11/2, appears to be having regular BMs 24. Normocytic anemia/ABLA: likely related to hospitalization and chronic disease  -no gross blood loss  continue b complex and iron  -10/30 HGB improved to 10.6 25. Hypoglycemia:     -he's not diabetic. Dc'ed cbg checks.  26. Hypokalemia  -10/30 Stable at K+ 3.8 27. HTN  -10/31 Started metoprolol 25mg  BID -should improve further with flomax on board     LOS: 38 days A FACE TO [place] 02/26/2022, 11:56 AM

## 2022-02-28 DIAGNOSIS — D649 Anemia, unspecified: Secondary | ICD-10-CM

## 2022-02-28 NOTE — Progress Notes (Signed)
I contacted general surgery last week (Thursday and Friday) and spoke to PA on call regarding gastrostomy tube placement.  On her eval, the patient was now taking in adequate PO and his son asked that the patient be monitored and re-evaluated for need.  We will need to re-consult GS, if needed.

## 2022-02-28 NOTE — Progress Notes (Signed)
PROGRESS NOTE   Subjective/Complaints:  No new concerns this AM.  Seen at bedside this AM. Later he was sitting at nurses station.  Eating better past few days.   ROS: Limited due to cognitive/behavioral   Objective:   No results found.  No results for input(s): "WBC", "HGB", "HCT", "PLT" in the last 72 hours.    No results for input(s): "NA", "K", "CL", "CO2", "GLUCOSE", "BUN", "CREATININE", "CALCIUM" in the last 72 hours.    Intake/Output Summary (Last 24 hours) at 02/28/2022 0817 Last data filed at 02/28/2022 0816 Gross per 24 hour  Intake 480 ml  Output 1800 ml  Net -1320 ml       Pressure Injury 01/13/22 Coccyx Mid Unstageable - Full thickness tissue loss in which the base of the injury is covered by slough (yellow, tan, gray, green or brown) and/or eschar (tan, brown or black) in the wound bed. skin is broken with redness at the (Active)  01/13/22 1930  Location: Coccyx  Location Orientation: Mid  Staging: Unstageable - Full thickness tissue loss in which the base of the injury is covered by slough (yellow, tan, gray, green or brown) and/or eschar (tan, brown or black) in the wound bed.  Wound Description (Comments): skin is broken with redness at the base  Present on Admission: Yes (present on admission to rehab 9/27)    Physical Exam: Vital Signs Blood pressure (!) 130/58, pulse 71, temperature 97.9 F (36.6 C), resp. rate 16, height 5\' 11"  (1.803 m), weight 56.1 kg, SpO2 98 %.   Constitutional: No distress . Vital signs reviewed. mittens HEENT: NCAT, EOMI, oral membranes moist Neck: supple Cardiovascular: RRR without murmur. No JVD    Respiratory/Chest: CTA Bilaterally without wheezes or rales. Normal effort    GI/Abdomen: BS +, non-tender, non-distended Ext: no clubbing, cyanosis, or edema Psych: pleasantly confused Skin: wounds at knees, both hands, left elbow, right ankle.  Small coccyx wound  present, minimal drainage, appears decreased in size from last week   Neuro: Awake and more alert, speech fairly clear. . Follows simple commands intermittently. Oriented to self  only,  Confused with no agitation.  Moves all 4 limbs spontaneously but difficut to grade.  Appears to fatigue easy Musculoskeletal: no focal jt pain, nl prom. Right arm moving more freely    Assessment/Plan: 1. Functional deficits which require 3+ hours per day of interdisciplinary therapy in a comprehensive inpatient rehab setting. Physiatrist is providing close team supervision and 24 hour management of active medical problems listed below. Physiatrist and rehab team continue to assess barriers to discharge/monitor patient progress toward functional and medical goals  Care Tool:  Bathing        Body parts bathed by helper: Right arm, Left arm, Chest, Abdomen, Front perineal area, Buttocks, Right upper leg, Left upper leg, Right lower leg, Left lower leg, Face     Bathing assist Assist Level: 2 Helpers     Upper Body Dressing/Undressing Upper body dressing   What is the patient wearing?: Pull over shirt    Upper body assist Assist Level: Dependent - Patient 0%    Lower Body Dressing/Undressing Lower body dressing  Lower body assist Assist for lower body dressing: 2 Helpers     Toileting Toileting    Toileting assist Assist for toileting: Moderate Assistance - Patient 50 - 74%     Transfers Chair/bed transfer  Transfers assist     Chair/bed transfer assist level: Moderate Assistance - Patient 50 - 74%     Locomotion Ambulation   Ambulation assist   Ambulation activity did not occur: Safety/medical concerns  Assist level: Moderate Assistance - Patient 50 - 74% Assistive device: Hand held assist Max distance: 180   Walk 10 feet activity   Assist  Walk 10 feet activity did not occur: Safety/medical concerns  Assist level: Moderate Assistance - Patient - 50 -  74% Assistive device: Hand held assist   Walk 50 feet activity   Assist Walk 50 feet with 2 turns activity did not occur: Safety/medical concerns  Assist level: Moderate Assistance - Patient - 50 - 74% Assistive device: Hand held assist    Walk 150 feet activity   Assist Walk 150 feet activity did not occur: Safety/medical concerns  Assist level: Moderate Assistance - Patient - 50 - 74% Assistive device: Hand held assist    Walk 10 feet on uneven surface  activity   Assist Walk 10 feet on uneven surfaces activity did not occur: Safety/medical concerns         Wheelchair     Assist Is the patient using a wheelchair?: Yes Type of Wheelchair: Manual    Wheelchair assist level: Dependent - Patient 0% (TIS)      Wheelchair 50 feet with 2 turns activity    Assist    Wheelchair 50 feet with 2 turns activity did not occur: Safety/medical concerns       Wheelchair 150 feet activity     Assist  Wheelchair 150 feet activity did not occur: Safety/medical concerns       Blood pressure (!) 130/58, pulse 71, temperature 97.9 F (36.6 C), resp. rate 16, height 5\' 11"  (1.803 m), weight 56.1 kg, SpO2 98 %.  Medical Problem List and Plan: 1. 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   - [place] level V  -Continue CIR therapies including PT, OT, and SLP on QD schedule. 2.  Antithrombotics: -DVT/anticoagulation:  Pharmaceutical: Lovenox 30 mg BID             -antiplatelet therapy: aspirin 81 mg daily 3. Pain Management: continue Tylenol per tube scheduled,\; oxycodone, Robaxin prn  10/23 Pt denies pain this AM 4. 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 Neuropsych/cognition: This patient is not capable of making decisions on his own behalf.               6. Skin/Wound Care: routine skin care checks             -monitor scalp lac/skin abrasions             -appreciate WOC recs for unstageable sacral/coccyx pressure wound   -continue medihoney/foam  dressing 7. Fluids/Electrolytes/Nutrition/dysphagia: Strict Is and Os and follow-up chemistries             -MBS --diet initiated D2/thins  10/12-decided to keep cortrak in for now, decreased HS TF  -pt appears to have eaten a little more this morning  -discussed PO intake with pt. Demonstrated SOME insight  -BMET WNL  -added megace to help stimulate appetite on 10/12   -11/2 still not eating or drinking enough consistently. Son has agreed to PEG. IR cannot place as colon is in front of stomach. Have requested surgery consult. I still don't see a consult from them.  -11/6 Contacted surgery regarding PEG, they say Son wanted to hold off because pt has been eating better last few days   8: Right distal clavicle and humerus fracture:    -10/25 Xrays reviewed by ortho today, continue sling and NWB RUE recommended.  F/u Dr. 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   -retrial of flomax    -appreciate urology help in replacing foley 15: SAH:  completed 7 days of Keppra; follow-up with neurosurgery 16: Volume overload/peripheral edema: improved 18: Elevated transaminases: trending downward  -AST 28 and ALT 34 10/6, improved 19:  ?aspiration pneumonia w/x hx of staph and pseudomonas - vancomycin and cefepime total of 7 more days ended 10/5  -10/30 WBC stable at 8.4 20: SVT/sinus tach  intermittently: monitor 21: Mediastinal hematoma: Echo WNL; Hgb stable 22: Ankle edema and ecchymosis: x-rays negative for osseous abnormality           Continue TEDS, elevation in bed 23. Loose stools improved. Had been constipated.   - LBM 11/6 today Large type 6, improved 24. Normocytic anemia/ABLA: likely related to hospitalization and chronic disease  -no gross blood loss  continue b complex and iron  -11/6 HGB up to 10.6 25. Hypoglycemia:     -he's not diabetic. Dc'ed cbg checks.  26. Hypokalemia  -11/6 Stable at 3.8 27. HTN  -10/31 Started metoprolol 25mg  BID -should improve further with flomax on board     LOS: 40 days A FACE TO [place] 02/28/2022, 8:17 AM

## 2022-02-28 NOTE — Progress Notes (Signed)
Occupational Therapy Session Note  Patient Details  Name: Madox Corkins MRN: [id number] Date of Birth: 11/14/1940  Today's Date: 02/28/2022 OT Individual Time: 1130-1200 OT Individual Time Calculation (min): 30 min    Short Term Goals: Week 4:  OT Short Term Goal 1 (Week 4): Pt will complete toilet transfer with MIN A consistently OT Short Term Goal 1 - Progress (Week 4): Progressing toward goal OT Short Term Goal 2 (Week 4): Pt will complete 1/3 steps of LB dressing OT Short Term Goal 2 - Progress (Week 4): Met OT Short Term Goal 3 (Week 4): Pt will be oriented to place 25% of encounters OT Short Term Goal 3 - Progress (Week 4): Progressing toward goal Week 5:  OT Short Term Goal 1 (Week 5): STG=LTG d/t ELOS  Skilled Therapeutic Interventions/Progress Updates:   Pt seen for skilled OT session with care coordination via handoff from Henrietta who provided highlights of previous session and pt's current behavioral status in general. Pt agreeable and talkative during session this am. Transported in TIS to sink side in room for functional activity incorporating basic attention,1-2 step functional task sequencing, forward weight shifts and simple scanning. OT asked some basic questions and pt became somewhat tearful reporting he worked his whole life relating to "[place]". Pt was somewhat perseverative re: this topic but was able to redirect for functional task attention. OT facilitated forward weight shift with manual cues and support for sit to stand x 4 trials with 2 successful attempts to integrate for turing on water faucet with mod fading to min A. Pt was able to doff soiled pull over shirt with initiation assist, UB bathing, hair combing, deodorant application and donning clean pullover shirt with mod cues due to apraxia, demo and min-mod A for follow through. Clean socks presented, max cues to perform cross over technique to doff socks but pt unable to complete donning without max A.  No agitation demo this session and easily transitioned back to main nursing session for 1:1 S and assist for lunch meal with staff agreeable to handoff.     Therapy Documentation Precautions:  Precautions Precautions: Fall, Cervical Precaution Booklet Issued: No Precaution Comments: cortrak, bilateral soft mitts Required Braces or Orthoses: Sling, Cervical Brace Cervical Brace: At all times, Hard collar Restrictions Weight Bearing Restrictions: Yes RUE Weight Bearing: Non weight bearing Other Position/Activity Restrictions: in sling, nonoperative mgmt per ortho 9/18  Agitated Behavior Scale: TBI ABS discontinued d/t ABS score less than 20 for the last three days or no behaviors present  Therapy/Group: Individual Therapy  Barnabas Lister 02/28/2022, 7:57 AM

## 2022-02-28 NOTE — Progress Notes (Signed)
Speech Language Pathology TBI Daily Session Note  Patient Details  Name: Ahmet Schank MRN: [id number] Date of Birth: 11/21/1940  Today's Date: 02/28/2022 SLP Individual Time: 1107-1130 SLP Individual Time Calculation (min): 23 min  Short Term Goals: Week 6: SLP Short Term Goal 1 (Week 6): Patient will consume current diet with minimal overt s/s of aspiration and sup verbal cues for use of swallowing compensatory strategies. SLP Short Term Goal 2 (Week 6): Patient will orient to place, month and situation with Max A multimodal cues. SLP Short Term Goal 3 (Week 6): Patient will demonstate sustained [place] to functional tasks for 7 minutes with Max A multimodal cues for redirection.  Skilled Therapeutic Interventions: Skilled treatment session focused on cognitive goals. Upon arrival, patient was awake while upright in the wheelchair. Patient was calm and cooperative throughout the session with language of confusion noted. SLP facilitated session by providing Max A verbal cues for problem solving, initiation, and sustained attention for ~60 seconds to a basic calendar making task in which patient had to place the numbers in sequential order. Max verbal cues were also needed for orientation to month. Patient handed off to OT. Continue with current plan of care.      Pain No/Denies Pain   ABS discontinued d/t ABS score less than 20 for the last three days or no behaviors present   Therapy/Group: Individual Therapy  Darrius Montano 02/28/2022, 3:27 PM

## 2022-02-28 NOTE — Progress Notes (Signed)
Physical Therapy Session Note  Patient Details  Name: Dominic Smith MRN: [id number] Date of Birth: October 02, 1940  Today's Date: 02/28/2022 PT Individual Time: 2563-8937 PT Individual Time Calculation (min): 45 min   Short Term Goals: Week 6:  PT Short Term Goal 1 (Week 6): STG=LTG due to ELOS  Skilled Therapeutic Interventions/Progress Updates:     Patient in bed with B mitts donned upon PT arrival. Patient alert and agreeable to PT session. Patient denied pain during session.  Patient in good spirits this morning, pleasantly confused, oriented only to self, and cracking jokes with PT. PT provided orientation during session.   Therapeutic Activity: Bed Mobility: Donned B thigh high TED hose and non-skid socks bed level with total A for energy/time management prior to mobility. Patient performed supine to sit with min-mod A for trunk control and initiation. Provided verbal cues for initiation and sequencing, noted improved latency for initiation and motor planning with mobility this session. Threaded legs through paper pants with total A due to catheter management and pulled pants up in standing with max A with patient pulling up on L side with limited success.  Transfers: Patient performed stand pivot bed>TIS w/c and sit to/from stand x1 with min-mod A using L HHA. Provided verbal cues for scooting forward, forward weight shift, and facilitation for forward weight shift due to posterior bias when coming to standing.  Gait Training:  Patient ambulated ~100 feet using L HHA with min-mod A due to posterior bias and L inattention. Ambulated with narrow BOS, shuffling gait, and poor visual scanning L>R. Provided verbal cues for erect posture, visual targets for change in direction, and increased BOS. Limited distance due to patient fatigue, patient requested rest break, but denied symptoms other than fatigue at this time.   Patient required increased time for initiation, cuing, rest breaks, and for  completion of tasks throughout session. Utilized therapeutic use of self throughout to promote efficiency.   Patient in [place] w/c handed off to LPN at the nurses station for 1-1 supervision at end of session with breaks locked, seat belt alarm set, and all needs within reach. LPN and PT agreed to leave B mitts off during 1-1 supervision.   Therapy Documentation Precautions:  Precautions Precautions: Fall, Cervical Precaution Booklet Issued: No Precaution Comments: cortrak, bilateral soft mitts Required Braces or Orthoses: Sling, Cervical Brace Cervical Brace: At all times, Hard collar Restrictions Weight Bearing Restrictions: Yes RUE Weight Bearing: Non weight bearing Other Position/Activity Restrictions: in sling, nonoperative mgmt per ortho 9/18   Therapy/Group: Individual Therapy  Dominic Smith L Homar Weinkauf PT, DPT, NCS, CBIS  02/28/2022, 12:16 PM

## 2022-03-01 MED ORDER — ASPIRIN 81 MG PO CHEW
81.0000 mg | CHEWABLE_TABLET | Freq: Every day | ORAL | Status: DC
  Administered 2022-03-01 – 2022-03-08 (×8): 81 mg via ORAL
  Filled 2022-03-01 (×7): qty 1

## 2022-03-01 NOTE — Progress Notes (Signed)
Patient ID: Dominic Smith, male   DOB: [birth year], 81 y.o.   MRN: [id number]  SW spoke with pt ex-wife [name] to provide updates from team conference, and will be sending out SNF referral this week. Preferred SNF locations: 1) [place], 2) [facility], [facility], 4) [facility], 5) [facility], 6)[facility] 7 ) [facility], and 8) [facility].  SW discussed if she has applied for Medicaid. Reports she intends to go to Medicaid office to get assistance as she tried to apply online and there were too many questions. SW informed will continue to provide updates once there is more information available.   Loralee Pacas, MSW, [place] Office: [phone] Cell: [phone] Fax: [phone]

## 2022-03-01 NOTE — Progress Notes (Signed)
Nutrition Follow-up  DOCUMENTATION CODES:   Underweight  INTERVENTION:   Continue Ensure Enlive po TID, each supplement provides 350 kcal and 20 grams of protein. Continue B Complex with Vitamin C Continue Magic cup TID with meals, each supplement provides 290 kcal and 9 grams of protein  NUTRITION DIAGNOSIS:   Inadequate oral intake related to lethargy/confusion as evidenced by meal completion < 50%. - Progressing  GOAL:   Patient will meet greater than or equal to 90% of their needs - Progressing  MONITOR:   PO intake, Supplement acceptance, Weight trends, Skin  REASON FOR ASSESSMENT:   Consult Assessment of nutrition requirement/status, Poor PO, Calorie Count  ASSESSMENT:   81 y.o. 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 PMH reviewed.  10/09 - diet advanced to Dysphagia 2, thin liquids 10/20 - Cortrak removed   11/01 - diet advanced to Dysphagia 3, thin liquids  Pt sleeping at time of visit, did not wake to RD voice or touch. Discussed with Rn, RN reports pt just received medication. Reports that he has been eating well and drinking the Ensure shakes.  Son would like to hold on PEG placement due to pt eating much better now.   Meal Documentation 10/13-10/17: 25-50% x 6 meals (average 43%) 10/21-10/23: 0-100% x 7 meals (average 53%) 11/05-11/07: 50-100% x 8 meals (average 91%)  Medications reviewed and include: B Complex w/ Vit C, Megace, Miralax Labs reviewed.   Diet Order:   Diet Order             DIET DYS 3 Room service appropriate? Yes; Fluid consistency: Thin  Diet effective now                   EDUCATION NEEDS:   No education needs have been identified at this time  Skin:  Skin Assessment: Skin Integrity Issues: Skin Integrity Issues:: Unstageable, Other (Comment) Unstageable: Unstageable wound to coccyx Other: MASD perineum  Last BM:  11/6  Height:   Ht Readings from Last 1  Encounters:  01/19/22 5\' 11"  (1.803 m)    Weight:   Wt Readings from Last 1 Encounters:  03/01/22 56.2 kg    Ideal Body Weight:  78.2 kg  BMI:  Body mass index is 17.28 kg/m.  Estimated Nutritional Needs:  Kcal:  1800-2000 Protein:  90-105 grams Fluid:  >/= 1.8 L   Hermina Barters RD, LDN Clinical Dietitian See [facility] for contact information.

## 2022-03-01 NOTE — Progress Notes (Signed)
PROGRESS NOTE   Subjective/Complaints:  Son in room. Pt eating hash brown from [employer]. No new issues reported.   ROS: Limited due to cognitive/behavioral   Objective:   No results found.  No results for input(s): "WBC", "HGB", "HCT", "PLT" in the last 72 hours.    No results for input(s): "NA", "K", "CL", "CO2", "GLUCOSE", "BUN", "CREATININE", "CALCIUM" in the last 72 hours.    Intake/Output Summary (Last 24 hours) at 03/01/2022 0908 Last data filed at 03/01/2022 0443 Gross per 24 hour  Intake 707 ml  Output 1000 ml  Net -293 ml      Pressure Injury 01/13/22 Coccyx Mid Unstageable - Full thickness tissue loss in which the base of the injury is covered by slough (yellow, tan, gray, green or brown) and/or eschar (tan, brown or black) in the wound bed. skin is broken with redness at the (Active)  01/13/22 1930  Location: Coccyx  Location Orientation: Mid  Staging: Unstageable - Full thickness tissue loss in which the base of the injury is covered by slough (yellow, tan, gray, green or brown) and/or eschar (tan, brown or black) in the wound bed.  Wound Description (Comments): skin is broken with redness at the base  Present on Admission: Yes (present on admission to rehab 9/27)    Physical Exam: Vital Signs Blood pressure (!) 166/72, pulse 75, temperature 98.4 F (36.9 C), temperature source Oral, resp. rate 16, height 5\' 11"  (1.803 m), weight 56.2 kg, SpO2 100 %.   Constitutional: No distress . Vital signs reviewed. HEENT: NCAT, EOMI, oral membranes moist Neck: supple Cardiovascular: RRR without murmur. No JVD    Respiratory/Chest: CTA Bilaterally without wheezes or rales. Normal effort    GI/Abdomen: BS +, non-tender, non-distended Ext: no clubbing, cyanosis, or edema Psych: confused,  cooperative  Skin: wounds at knees, both hands, left elbow, right ankle.  Small coccyx wound present, minimal drainage, appears  decreased in size from last week   Neuro: speech fairly clear. . Follows simple commands intermittently. Oriented to self  and hospital today, limited insight, confused.  Moves all 4 limbs spontaneously but difficut to grade.    Musculoskeletal: no focal jt pain, nl prom. Right arm moving more automatically    Assessment/Plan: 1. Functional deficits which require 3+ hours per day of interdisciplinary therapy in a comprehensive inpatient rehab setting. Physiatrist is providing close team supervision and 24 hour management of active medical problems listed below. Physiatrist and rehab team continue to assess barriers to discharge/monitor patient progress toward functional and medical goals  Care Tool:  Bathing        Body parts bathed by helper: Right arm, Left arm, Chest, Abdomen, Front perineal area, Buttocks, Right upper leg, Left upper leg, Right lower leg, Left lower leg, Face     Bathing assist Assist Level: 2 Helpers     Upper Body Dressing/Undressing Upper body dressing   What is the patient wearing?: Pull over shirt    Upper body assist Assist Level: Dependent - Patient 0%    Lower Body Dressing/Undressing Lower body dressing            Lower body assist Assist for lower body dressing:  2 Helpers     Chartered loss adjuster assist Assist for toileting: Moderate Assistance - Patient 50 - 74%     Transfers Chair/bed transfer  Transfers assist     Chair/bed transfer assist level: Moderate Assistance - Patient 50 - 74%     Locomotion Ambulation   Ambulation assist   Ambulation activity did not occur: Safety/medical concerns  Assist level: Moderate Assistance - Patient 50 - 74% Assistive device: Hand held assist Max distance: 180   Walk 10 feet activity   Assist  Walk 10 feet activity did not occur: Safety/medical concerns  Assist level: Moderate Assistance - Patient - 50 - 74% Assistive device: Hand held assist   Walk 50 feet  activity   Assist Walk 50 feet with 2 turns activity did not occur: Safety/medical concerns  Assist level: Moderate Assistance - Patient - 50 - 74% Assistive device: Hand held assist    Walk 150 feet activity   Assist Walk 150 feet activity did not occur: Safety/medical concerns  Assist level: Moderate Assistance - Patient - 50 - 74% Assistive device: Hand held assist    Walk 10 feet on uneven surface  activity   Assist Walk 10 feet on uneven surfaces activity did not occur: Safety/medical concerns         Wheelchair     Assist Is the patient using a wheelchair?: Yes Type of Wheelchair: Manual    Wheelchair assist level: Dependent - Patient 0% (TIS)      Wheelchair 50 feet with 2 turns activity    Assist    Wheelchair 50 feet with 2 turns activity did not occur: Safety/medical concerns       Wheelchair 150 feet activity     Assist  Wheelchair 150 feet activity did not occur: Safety/medical concerns       Blood pressure (!) 166/72, pulse 75, temperature 98.4 F (36.9 C), temperature source Oral, resp. rate 16, height 5\' 11"  (1.803 m), weight 56.2 kg, SpO2 100 %.  Medical Problem List and Plan: 1. 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   - Ranchos level V  -Continue CIR therapies including PT, OT, and SLP. Interdisciplinary team conference today to discuss goals, barriers to discharge, and dc planning.    -discussed plan with son at length today including PEG/nutrition 2.  Antithrombotics: -DVT/anticoagulation:  Pharmaceutical: Lovenox 30 mg BID             -antiplatelet therapy: aspirin 81 mg daily 3. Pain Management: continue Tylenol per tube scheduled,\; oxycodone, Robaxin prn  10/23 Pt denies pain this AM 4. 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 Neuropsych/cognition: This patient is not capable of making decisions on his own behalf.               6. Skin/Wound Care: routine skin care checks             -monitor scalp lac/skin abrasions             -appreciate  WOC recs for unstageable sacral/coccyx pressure wound   -continue medihoney/foam dressing 7. Fluids/Electrolytes/Nutrition/dysphagia: Strict Is and Os and follow-up chemistries             -MBS --diet initiated D2/thins  10/12-decided to keep cortrak in for now, decreased HS TF  -pt appears to have eaten a little more this morning  -discussed PO intake with pt. Demonstrated SOME insight  -BMET WNL  -added megace to help stimulate appetite on 10/12   -11/6 pt eating more again. Will hold off on PEG permanently, d/w son   -will check BMET tomorrow, consider resuming IVF (HS only) 8: Right distal clavicle and humerus fracture:    -10/25 Xrays reviewed by ortho today, continue sling and NWB RUE recommended.  F/u Dr. 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   -retrial of flomax without drop in BP  -appreciate urology help in replacing foley 15: SAH:  completed 7 days of Keppra; follow-up with neurosurgery 16: Volume overload/peripheral edema: improved 18: Elevated transaminases: trending downward  -AST 28 and ALT 34 10/6, improved 19:  ?aspiration pneumonia w/x hx of staph and pseudomonas - vancomycin and cefepime total of 7 more days ended 10/5  -10/30 WBC stable at 8.4 20: SVT/sinus tach intermittently: monitor 21: Mediastinal hematoma: Echo WNL; Hgb stable 22: Ankle edema and  ecchymosis: x-rays negative for osseous abnormality           Continue TEDS, elevation in bed 23. Loose stools improved.     - LBM 11/7, large formed 24. Normocytic anemia/ABLA: likely related to hospitalization and chronic disease  -no gross blood loss  continue b complex and iron  -10/30 HGB improved to 10.6 25. Hypoglycemia:     -he's not diabetic. Dc'ed cbg checks.  26. Hypokalemia  -10/30 Stable at K+ 3.8 27. HTN  -10/31 Started metoprolol 25mg  BID -overall improved, some systolic elevatoin [id number]    LOS: 41 days A FACE TO [place] 03/01/2022, 9:08 AM

## 2022-03-01 NOTE — Patient Care Conference (Signed)
Inpatient RehabilitationTeam Conference and Plan of Care Update Date: 03/01/2022   Time: 10:26 AM   Patient Name: Dominic Smith      Medical Record Number: [id number]  Date of Birth: 10-30-1940 Sex: Male         Room/Bed: 4W10C/4W10C-01 Payor Info: Payor: MEDICARE / Plan: MEDICARE PART A / Product Type: *No Product type* /    Admit Date/Time:  01/19/2022  2:31 PM  Primary Diagnosis:  TBI (traumatic brain injury) [facility])  Hospital Problems: Principal Problem:   TBI (traumatic brain injury) ([place]) Active Problems:   HCAP (healthcare-associated pneumonia)   Azotemia   Urinary retention   Hypervolemia   Closed fracture of head of right humerus    Expected Discharge Date: Expected Discharge Date:  (SNF)  Team Members Present: Physician leading conference: Dr. Alger Simons Social Worker Present: Loralee Pacas, [place] Nurse Present: Tacy Learn, RN PT Present: Apolinar Junes, PT OT Present: Mariane Masters, OT SLP Present: Weston Anna, SLP PPS Coordinator present : Gunnar Fusi, SLP     Current Status/Progress Goal Weekly Team Focus  Bowel/Bladder   Incontinent of bowel, Foley in place   Regain some continence of bowel.   Routinely toilet    Swallow/Nutrition/ Hydration   Dys. 3 textures with thin liquids, Min-Mod A   min A   tolerance of current diet    ADL's   MOD A transfers, posterior lean, MOD A ADLs, rancho 5, QD schedule, improved appetite, NWB thorugh RUE   MOD A   orientation, functional transfers, self feeding, family education/meeting    Mobility   Improved functional mobility, arousal, and participation this week, min-mod A with mobility ambulating up to 250 ft L HHA   Min A overall   Activity tolerance, arousal, initiation, functional mobility, motor planning, orientation, balance, patient/caregiver education    Communication                Safety/Cognition/ Behavioral Observations  Rancho Level V: Max-Total A with language of  confusion   Mod A   sustained attention, orientation, intellectual awareness    Pain   no c/o pain   Remain pain free   Assess Qshift and prn    Skin   Healing unstageable to buttocks, and MASD   Promote wound healing and prevention of infection   Assess Qshift and prn      Discharge Planning:  Pt will d/c to SNF. D/C pending until medically ready for transition.   Team Discussion: TBI. Incontinent of bowel. Foley remains in place. Denies pain. Healing unstagable to sacrum. Patient eating and supplements have improved with encouragement. No longer PEG tube placement. Patient participating in therapies with decreased agitation. Enclosure bed removed. No restraints in place at this time.  Patient on target to meet rehab goals: yes, ambulates 250 feet HHA  *See Care Plan and progress notes for long and short-term goals.   Revisions to Treatment Plan:  Restraints removed. Monitor behaviors. Monitor labs  Teaching Needs: Medications, safety, gait/transfer training, skin/wound, etc.   Current Barriers to Discharge: Incontinence, Lack of/limited family support, Weight bearing restrictions, and Behavior/Mood  Possible Resolutions to Barriers: SNF, once patient is medically ready.     Medical Summary Current Status: eating better. still confused. agitation improved. pain getter. no plans for peg now  Barriers to Discharge: Medical stability   Possible Resolutions to Barriers/Weekly Focus: pushing po. hs IVF if needed. pain control, education for family   Continued Need for Acute Rehabilitation Level of Care: The patient  requires daily medical management by a physician with specialized training in physical medicine and rehabilitation for the following reasons: Direction of a multidisciplinary physical rehabilitation program to maximize functional independence : Yes Medical management of patient stability for increased activity during participation in an intensive rehabilitation  regime.: Yes Analysis of laboratory values and/or radiology reports with any subsequent need for medication adjustment and/or medical intervention. : Yes   I attest that I was present, lead the team conference, and concur with the assessment and plan of the team.   Jearld Adjutant 03/01/2022, 2:28 PM

## 2022-03-01 NOTE — Progress Notes (Signed)
Occupational Therapy TBI Note  Patient Details  Name: Dominic Smith MRN: [id number] Date of Birth: January 02, 1941  {CHL IP REHAB OT TIME CALCULATIONS:304400400}   Short Term Goals: {OT SFK:8127517}  Skilled Therapeutic Interventions/Progress Updates:     Pt received in *** with *** out of 10 pain in ***. *** provided for pain relief  ADL: Pt completes ADL at overall *** Level. Skilled interventions include:  Neuromuscular Reeducation ****  Therapeutic exercise ***   Therapeutic activity ***  Pt left at end of session in *** with exit alarm on, call light in reach and all needs met   Therapy Documentation Precautions:  Precautions Precautions: Fall, Cervical Precaution Booklet Issued: No Precaution Comments: cortrak, bilateral soft mitts Required Braces or Orthoses: Sling, Cervical Brace Cervical Brace: At all times, Hard collar Restrictions Weight Bearing Restrictions: Yes RUE Weight Bearing: Non weight bearing Other Position/Activity Restrictions: in sling, nonoperative mgmt per ortho 9/18 General:   Agitated Behavior Scale: TBI       Therapy/Group: {Therapy/Group:[id number]}  [name] 03/01/2022, 6:53 AM

## 2022-03-01 NOTE — Progress Notes (Signed)
Physical Therapy Session Note  Patient Details  Name: Dominic Smith MRN: [id number] Date of Birth: 09-09-1940  Today's Date: 03/01/2022 PT Individual Time: 1035-1105 PT Individual Time Calculation (min): 30 min   Short Term Goals: Week 6:  PT Short Term Goal 1 (Week 6): STG=LTG due to ELOS  Skilled Therapeutic Interventions/Progress Updates:     Patient in bed upon PT arrival. Patient alert and agreeable to PT session. Patient denied pain during session.   Patient in good spirits again this morning, pleasantly confused, oriented only to self and location, and cracking jokes with PT. PT provided orientation during session.    Therapeutic Activity: Bed Mobility: Donned B thigh high TED hose and non-skid socks bed level with total A for energy/time management prior to mobility. Patient performed supine to sit with min-mod A for trunk control and initiation. Provided verbal cues for initiation and sequencing, noted improved latency for initiation and motor planning with mobility this session. Threaded legs through paper pants with total A due to catheter management and pulled pants up in standing with max A with patient pulling up on L side with limited success.  Transfers: Patient performed sit to/from stand x1 with min-mod A using L HHA. Provided verbal cues for scooting forward, forward weight shift, and facilitation for forward weight shift due to posterior bias when coming to standing.   Gait Training:  Patient ambulated ~200 feet using L HHA with min A and intermittent mod A due to posterior bias and L inattention, progressed to ambulating >50 ft with patient pushing TIS w/c to the nurses station. Ambulated with narrow BOS, shuffling gait, and poor visual scanning L>R. Provided verbal cues for erect posture, visual targets for change in direction, and increased BOS. Limited distance due to patient fatigue, patient requested rest break, but denied symptoms other than fatigue at this time.     Patient required increased time for initiation, cuing, rest breaks, and for completion of tasks throughout session. Utilized therapeutic use of self throughout to promote efficiency.    Patient in [place] w/c handed off to nurse secretary at the nurses station for 1-1 supervision at end of session with breaks locked, seat belt alarm set, and all needs within reach.   Therapy Documentation Precautions:  Precautions Precautions: Fall, Cervical Precaution Booklet Issued: No Precaution Comments: cortrak, bilateral soft mitts Required Braces or Orthoses: Sling, Cervical Brace Cervical Brace: At all times, Hard collar Restrictions Weight Bearing Restrictions: Yes RUE Weight Bearing: Non weight bearing Other Position/Activity Restrictions: in sling, nonoperative mgmt per ortho 9/18    Therapy/Group: Individual Therapy  Cherylyn Sundby L Eivan Gallina PT, DPT, NCS, CBIS  03/01/2022, 12:43 PM

## 2022-03-02 LAB — PREALBUMIN: Prealbumin: 18 mg/dL (ref 18–38)

## 2022-03-02 LAB — CBC
HCT: 32.1 % — ABNORMAL LOW (ref 39.0–52.0)
Hemoglobin: 10.5 g/dL — ABNORMAL LOW (ref 13.0–17.0)
MCH: 28.8 pg (ref 26.0–34.0)
MCHC: 32.7 g/dL (ref 30.0–36.0)
MCV: 87.9 fL (ref 80.0–100.0)
Platelets: 264 10*3/uL (ref 150–400)
RBC: 3.65 MIL/uL — ABNORMAL LOW (ref 4.22–5.81)
RDW: 14.4 % (ref 11.5–15.5)
WBC: 7.4 10*3/uL (ref 4.0–10.5)
nRBC: 0 % (ref 0.0–0.2)

## 2022-03-02 LAB — BASIC METABOLIC PANEL
Anion gap: 6 (ref 5–15)
BUN: 28 mg/dL — ABNORMAL HIGH (ref 8–23)
CO2: 23 mmol/L (ref 22–32)
Calcium: 9.2 mg/dL (ref 8.9–10.3)
Chloride: 111 mmol/L (ref 98–111)
Creatinine, Ser: 1.09 mg/dL (ref 0.61–1.24)
GFR, Estimated: >60 mL/min (ref >60)
Glucose, Bld: 102 mg/dL — ABNORMAL HIGH (ref 70–99)
Potassium: 4 mmol/L (ref 3.5–5.1)
Sodium: 140 mmol/L (ref 135–145)

## 2022-03-02 MED ORDER — SODIUM CHLORIDE 0.45 % IV SOLN: INTRAVENOUS | Status: DC

## 2022-03-02 MED ORDER — SODIUM CHLORIDE 0.9% FLUSH: 10.0000 mL | INTRAVENOUS | Status: DC | PRN

## 2022-03-02 MED ORDER — SODIUM CHLORIDE 0.9% FLUSH
10.0000 mL | Freq: Two times a day (BID) | INTRAVENOUS | Status: DC
  Administered 2022-03-02 – 2022-03-05 (×4): 10 mL

## 2022-03-02 NOTE — Plan of Care (Signed)
Pt's plan of care adjusted to 15/7 after speaking with care team and discussed with MD in team conference as pt currently tolerating current therapy schedule and would benefit from increased schedule to 15/7 due to increased arousal and participation with OT, PT, and SLP.    Dede Dobesh PT, DPT, NCS, CBIS

## 2022-03-02 NOTE — Progress Notes (Signed)

## 2022-03-02 NOTE — Progress Notes (Signed)
Occupational Therapy Weekly Progress Note  Patient Details  Name: Dominic Smith MRN: [id number] Date of Birth: 11-14-1940  Beginning of progress report period: February 24, 2022 End of progress report period: March 02, 2022  Today's Date: 03/02/2022 OT Individual Time: 802-825  Pt goals were set to LTG d/t SNF search, however pt has yet to DC SNF. Pt has transitioned out of an enclosure bed and into a regular bed with a telesitter d/t improved restlessness and decreased agitation. Pt with overall improved participation and appetite this week increasing functional mobility up to 273fet with min-mod A, ADLs with MOD-MAX A and improved command following. Pt remains at a rancho 5, with family having poor insight into prognosis.   Patient continues to demonstrate the following deficits: muscle weakness, decreased cardiorespiratoy endurance, impaired timing and sequencing and decreased coordination, decreased visual acuity and decreased visual perceptual skills, decreased motor planning and ideational apraxia, decreased initiation, decreased attention, decreased awareness, decreased problem solving, decreased safety awareness, decreased memory, delayed processing, and demonstrates behaviors consistent with Rancho Level 5, and decreased sitting balance, decreased standing balance, decreased postural control, and decreased balance strategies and therefore will continue to benefit from skilled OT intervention to enhance overall performance with BADL and Reduce care partner burden.  Patient progressing toward long term goals..  Continue plan of care.  OT Short Term Goals Week 6:  OT Short Term Goal 1 (Week 6): Pt will self feed 30 % of meals OT Short Term Goal 2 (Week 6): Pt will initate sit to stand 25% of offerings OT Short Term Goal 3 (Week 6): Pt will transfer wiht MOD A to power up to standing Week 7:  OT Short Term Goal 1 (Week 7): STG=LTG d/t ELOS  Skilled Therapeutic Interventions/Progress  Updates:     Pt received in bed with no pain reported somewhat resistive to idea of getting up or doing anything.  Attempted ot engage pt in EOB dressing and self feeding but pt refused to sit EOB. Pt was able to imitate bridging hips to advance pants pasthips after OT threads BLE and dons teds. Pt refuses to self feed but accepts 2 bites of magic cup activly turning head away from 3rd bite.  Pt left at end of session in bed  with exit alarm on, call light in reach and all needs met  ABS- ABS discontinued d/t ABS score less than 20 for the last three days or no behaviors present  Therapy Documentation Precautions:  Precautions Precautions: Fall, Cervical Precaution Booklet Issued: No Precaution Comments: cortrak, bilateral soft mitts Required Braces or Orthoses: Sling, Cervical Brace Cervical Brace: At all times, Hard collar Restrictions Weight Bearing Restrictions: Yes RUE Weight Bearing: Non weight bearing Other Position/Activity Restrictions: in sling, nonoperative mgmt per ortho 9/18 General:     Therapy/Group: Individual Therapy  STonny Branch11/11/2021, 6:45 AM

## 2022-03-02 NOTE — Progress Notes (Signed)
PROGRESS NOTE   Subjective/Complaints:  Pt rested well. Was sleeping when I came in. No new issues reported. Ate well again yesterday  ROS: Limited due to cognitive/behavioral    Objective:   No results found.  Recent Labs    03/02/22 0559  WBC 7.4  HGB 10.5*  HCT 32.1*  PLT 264      Recent Labs    03/02/22 0559  NA 140  K 4.0  CL 111  CO2 23  GLUCOSE 102*  BUN 28*  CREATININE 1.09  CALCIUM 9.2      Intake/Output Summary (Last 24 hours) at 03/02/2022 0825 Last data filed at 03/02/2022 0300 Gross per 24 hour  Intake 600 ml  Output 850 ml  Net -250 ml      Pressure Injury 01/13/22 Coccyx Mid Unstageable - Full thickness tissue loss in which the base of the injury is covered by slough (yellow, tan, gray, green or brown) and/or eschar (tan, brown or black) in the wound bed. skin is broken with redness at the (Active)  01/13/22 1930  Location: Coccyx  Location Orientation: Mid  Staging: Unstageable - Full thickness tissue loss in which the base of the injury is covered by slough (yellow, tan, gray, green or brown) and/or eschar (tan, brown or black) in the wound bed.  Wound Description (Comments): skin is broken with redness at the base  Present on Admission: Yes (present on admission to rehab 9/27)    Physical Exam: Vital Signs Blood pressure (!) 135/52, pulse 73, temperature 97.9 F (36.6 C), temperature source Oral, resp. rate 14, height 5\' 11"  (1.803 m), weight 56.1 kg, SpO2 100 %.   Constitutional: No distress . Vital signs reviewed. HEENT: NCAT, EOMI, oral membranes moist Neck: supple Cardiovascular: RRR without murmur. No JVD    Respiratory/Chest: CTA Bilaterally without wheezes or rales. Normal effort    GI/Abdomen: BS +, non-tender, non-distended Ext: no clubbing, cyanosis, or edema Psych: pleasant and cooperative  Skin: wounds at knees, both hands, left elbow, right ankle.  Small coccyx  wound still present, minimal drainage, appears decreased in size from last week   Neuro: speech pretty clear. Still confused. Oriented to person, hospital.  Moves all 4 limbs spontaneously but difficut to grade.    Musculoskeletal: no focal jt pain, nl prom. Right arm moving more automatically    Assessment/Plan: 1. Functional deficits which require 3+ hours per day of interdisciplinary therapy in a comprehensive inpatient rehab setting. Physiatrist is providing close team supervision and 24 hour management of active medical problems listed below. Physiatrist and rehab team continue to assess barriers to discharge/monitor patient progress toward functional and medical goals  Care Tool:  Bathing        Body parts bathed by helper: Right arm, Left arm, Chest, Abdomen, Front perineal area, Buttocks, Right upper leg, Left upper leg, Right lower leg, Left lower leg, Face     Bathing assist Assist Level: 2 Helpers     Upper Body Dressing/Undressing Upper body dressing   What is the patient wearing?: Pull over shirt    Upper body assist Assist Level: Dependent - Patient 0%    Lower Body Dressing/Undressing Lower body  dressing            Lower body assist Assist for lower body dressing: 2 Helpers     Toileting Toileting    Toileting assist Assist for toileting: Moderate Assistance - Patient 50 - 74%     Transfers Chair/bed transfer  Transfers assist     Chair/bed transfer assist level: Moderate Assistance - Patient 50 - 74%     Locomotion Ambulation   Ambulation assist   Ambulation activity did not occur: Safety/medical concerns  Assist level: Moderate Assistance - Patient 50 - 74% Assistive device: Hand held assist Max distance: 180   Walk 10 feet activity   Assist  Walk 10 feet activity did not occur: Safety/medical concerns  Assist level: Moderate Assistance - Patient - 50 - 74% Assistive device: Hand held assist   Walk 50 feet  activity   Assist Walk 50 feet with 2 turns activity did not occur: Safety/medical concerns  Assist level: Moderate Assistance - Patient - 50 - 74% Assistive device: Hand held assist    Walk 150 feet activity   Assist Walk 150 feet activity did not occur: Safety/medical concerns  Assist level: Moderate Assistance - Patient - 50 - 74% Assistive device: Hand held assist    Walk 10 feet on uneven surface  activity   Assist Walk 10 feet on uneven surfaces activity did not occur: Safety/medical concerns         Wheelchair     Assist Is the patient using a wheelchair?: Yes Type of Wheelchair: Manual    Wheelchair assist level: Dependent - Patient 0% (TIS)      Wheelchair 50 feet with 2 turns activity    Assist    Wheelchair 50 feet with 2 turns activity did not occur: Safety/medical concerns       Wheelchair 150 feet activity     Assist  Wheelchair 150 feet activity did not occur: Safety/medical concerns       Blood pressure (!) 135/52, pulse 73, temperature 97.9 F (36.6 C), temperature source Oral, resp. rate 14, height 5\' 11"  (1.803 m), weight 56.1 kg, SpO2 100 %.  Medical Problem List and Plan: 1. 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  Antithrombotics: -DVT/anticoagulation:  Pharmaceutical: Lovenox 30 mg BID             -antiplatelet therapy: aspirin 81 mg daily 3. Pain Management: continue Tylenol per tube scheduled,\; oxycodone, Robaxin prn  10/23 Pt denies pain this AM 4. 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 Neuropsych/cognition: This patient  is not capable of making decisions on his own behalf.               6. Skin/Wound Care: routine skin care checks             -monitor scalp lac/skin abrasions             -appreciate WOC recs for unstageable sacral/coccyx  pressure wound   -continue medihoney/foam dressing 7. Fluids/Electrolytes/Nutrition/dysphagia: Strict Is and Os and follow-up chemistries             -MBS --diet initiated D2/thins  10/12-decided to keep cortrak in for now, decreased HS TF  -pt appears to have eaten a little more this morning  -discussed PO intake with pt. Demonstrated SOME insight  -BMET WNL  -added megace to help stimulate appetite on 10/12   -11/8 ate well again yesterday   -BUN trending up though. Will resume IVF at HS   -prealbumin is up to 18 (13) 8: Right distal clavicle and humerus fracture:    -10/25 Xrays reviewed by ortho today, continue sling and NWB RUE recommended.  F/u Dr. 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   -retrial of flomax without drop in BP  -appreciate urology help in replacing foley  -voiding trial after discharge 15: SAH:  completed 7 days of Keppra; follow-up with neurosurgery 16: Volume overload/peripheral edema: improved 18: Elevated transaminases: trending downward  -AST 28 and ALT 34 10/6, improved 19:  ?aspiration pneumonia w/x hx of staph and pseudomonas - vancomycin and cefepime total of 7 more days ended 10/5  -10/30 WBC stable at 8.4 20: SVT/sinus tach intermittently: monitor 21: Mediastinal hematoma: Echo WNL; Hgb stable 22: Ankle edema and ecchymosis: x-rays negative for osseous abnormality           Continue TEDS, elevation in bed 23. Loose stools improved.     - LBM 11/7, large formed 24.  Normocytic anemia/ABLA: likely related to hospitalization and chronic disease  -no gross blood loss  continue b complex and iron  -10/30 HGB improved to 10.6 25. Hypoglycemia:     -he's not diabetic. Dc'ed cbg checks.  26. Hypokalemia  -10/30 Stable at K+ 3.8 27. HTN  -10/31 Started metoprolol 25mg  BID -overall improved     LOS: 42 days A FACE TO FACE EVALUATION WAS PERFORMED  Meredith Staggers 03/02/2022, 8:25 AM

## 2022-03-02 NOTE — Progress Notes (Signed)
Physical Therapy Session Note  Patient Details  Name: Dominic Smith MRN: [id number] Date of Birth: [birth year]  Today's Date: 03/02/2022 PT Individual Time: 1000-1030 PT Individual Time Calculation (min): 30 min   Short Term Goals: Week 6:  PT Short Term Goal 1 (Week 6): STG=LTG due to ELOS  Skilled Therapeutic Interventions/Progress Updates:     Patient in bed upon PT arrival. Patient alert, but minimally verbal and attentive to PT this morning. Noted increased R gaze and L inattention and delayed response. Patient mostly shaking his head yes/no in response to questions and became tearful when initiating movement with PT to sit EOB. Patient unable vs unwilling to verbalize cause for tearfulness. Denied pain or frustration during session.   Therapeutic Activity: Bed Mobility: Patient with B thigh high TEDs and abdominal binder donned prior to session, donned B non-skid socks with total A due to poor patient initiation. Patient performed sit to supine to/from sit with max-total A with use of HOB for trunk control. Provided verbal cues for initiation and sequencing with limited to no initiation from patient. Once in sitting, patient scooted forward with total A to place feet on the floor and progressed from min A to supervision sitting EOB >5 min. Patient with continued R gaze despite PT sitting on his L side, turned his head to therapist x2 despite constant stimulation on his L. Patient did not follow basic cues for reaching, naming objects, or answering orientation questions. Patient just shaking his head no in response to cues. Provided orientation with patient in sitting. Transfers: Patient performed stand pivot bed>TIS w/c with max-mod A with increased posterior bias compared to last 2 days. Provided verbal cues and facilitation for initiation, forward weight shift, lateral weight shift to step, and hand-over-hand assist for reaching back to sit.  Patient required increased time for  initiation, cuing, rest breaks, and for completion of tasks throughout session. Utilized therapeutic use of self throughout to promote efficiency.   Patient in TIS w/c handed off to nurse secretary for 1-1 supervision in sitting at end of session with breaks locked, seat belt alarm set, and all needs within reach.   Therapy Documentation Precautions:  Precautions Precautions: Fall, Cervical Precaution Booklet Issued: No Precaution Comments: cortrak, bilateral soft mitts Required Braces or Orthoses: Sling, Cervical Brace Cervical Brace: At all times, Hard collar Restrictions Weight Bearing Restrictions: Yes RUE Weight Bearing: Non weight bearing Other Position/Activity Restrictions: in sling, nonoperative mgmt per ortho 9/18    Therapy/Group: Individual Therapy  Semaje Kinker L Merdith Adan PT, DPT, NCS, CBIS  03/02/2022, 12:42 PM

## 2022-03-03 NOTE — Progress Notes (Signed)
Occupational Therapy TBI Note  Patient Details  Name: Quay Simkin MRN: [id number] Date of Birth: 06-13-1940  Today's Date: 03/03/2022 OT Individual Time: 1300-1330 OT Individual Time Calculation (min): 30 min    Short Term Goals: Week 1:  OT Short Term Goal 1 (Week 1): Pt will consistently transfer to toilet with 1 caregiver OT Short Term Goal 1 - Progress (Week 1): Met OT Short Term Goal 2 (Week 1): Pt will complete 1/4 steps of donning shirt OT Short Term Goal 2 - Progress (Week 1): Progressing toward goal OT Short Term Goal 3 (Week 1): Pt will complete oral care with MOD A OT Short Term Goal 3 - Progress (Week 1): Progressing toward goal OT Short Term Goal 4 (Week 1): Pt will sit to stand with MOD A or less OT Short Term Goal 4 - Progress (Week 1): Met  Skilled Therapeutic Interventions/Progress Updates:     Pt received in bed with no pain   ADL: Pt completes EOB changing of clothes with total A to remove all distractors (bed sheets/call bell etc) to assist pt to initiate bed mobility. Pt requires MOD A for bed mobility then S for sitting balance. Pt requires demonstration cuing for doffing strategy with shirt, A to orient new shirt to self (unaware donning backwards) and CGA for changing pants at sit to stand level. Pt able to don socks with cuing to find opening of the sock. MIN A ambulatory transfer to TIS with OT keeping pt from pushing up with RUE.   Pt left at end of session in bed with exit alarm on, call light in reach and all needs met   Therapy Documentation Precautions:  Precautions Precautions: Fall, Cervical Precaution Booklet Issued: No Precaution Comments: cortrak, bilateral soft mitts Required Braces or Orthoses: Sling, Cervical Brace Cervical Brace: At all times, Hard collar Restrictions Weight Bearing Restrictions: Yes RUE Weight Bearing: Non weight bearing Other Position/Activity Restrictions: in sling, nonoperative mgmt per ortho 9/18 General:       Agitated Behavior Scale: TBI ABS discontinued d/t ABS score less than 20 for the last three days or no behaviors present        Therapy/Group: Individual Therapy  [name] 03/03/2022, 6:53 AM

## 2022-03-03 NOTE — Progress Notes (Signed)
Physical Therapy Weekly Progress Note  Patient Details  Name: Dominic Smith MRN: [id number] Date of Birth: [birth year]  Beginning of progress report period: February 24, 2022 End of progress report period: March 03, 2022  Today's Date: 03/03/2022 PT Individual Time: 0093-8182 PT Individual Time Calculation (min): 30 min   Patient with steady improvement in arousal and functional mobility this week. Continued to have x1 day of poor participation, otherwise progressed to min-mod A for bed mobility and transfers, ambulating >250 feet with CGA-min A with and without Smith HHA, and min A for 4x6" steps using B rails. Continues to demonstrate decreased orientation and language of confusion throughout sessions. Overall with improved affect and bright spirits this week with therapy. Increased patient's therapy schedule to 15/7 due to improved participation and patient tolerance. Patient is awaiting SNF placement and progressing toward LTG. Caregiver is unable to provide the necessary assistance needed at d/c. Patient will benefit from low intensity skilled PT at d/c to continue to progress to a level that his caregiver is able to assist him at home.   Patient continues to demonstrate the following deficits muscle weakness and muscle joint tightness, decreased cardiorespiratoy endurance, decreased midline orientation, decreased attention to left, and decreased motor planning, decreased initiation, decreased attention, decreased awareness, decreased problem solving, decreased safety awareness, decreased memory, delayed processing, and demonstrates behaviors consistent with [place] Level V, and decreased sitting balance, decreased standing balance, decreased postural control, decreased balance strategies, and difficulty maintaining precautions and therefore will continue to benefit from skilled PT intervention to increase functional independence with mobility.  Patient progressing toward long term goals.  Continue  plan of care.  PT Short Term Goals Week 6:  PT Short Term Goal 1 (Week 6): STG=LTG due to ELOS Week 7:     Skilled Therapeutic Interventions/Progress Updates:     Patient in TIS w/c at the nurses station, handed off from unit secretary, upon PT arrival. Patient alert and agreeable to PT session. Patient denied pain during session.  Patient in good spirits and verbose with language of confusion throughout session. Oriented only to his name, provided orientation during session.   Therapeutic Activity: Transfers: Patient performed sit to/from stand x3 with min A and increased time for initiation. Provided verbal cues for pushing up with his Smith hand, foot placement (pt tends to cross his feet and unaware that they are crossed before standing), and forward weight shift. Initiation improved with 1-2-3 count cuing.   Gait Training:  Patient ambulated >250 feet using Smith HHA with min A. He then ambulated ~50 feet x2 without AD with CGA-min A for steadying support. Ambulated with narrow BOS, shuffling gait, and poor visual scanning Smith>R. Provided verbal cues for erect posture, visual targets for change in direction, and increased BOS   Patient ascended/descended 4x6" steps using B rails with min A. Performed reciprocal gait pattern while ascending and step-to gait pattern while descending. Provided cues for technique and sequencing.   Patient required increased time for initiation, cuing, rest breaks, and for completion of tasks throughout session. Utilized therapeutic use of self throughout to promote efficiency.   Patient in TIS w/c, handed off to unit secretary, at end of session with breaks locked, seat belt alarm set, and all needs within reach.   Therapy Documentation Precautions:  Precautions Precautions: Fall, Cervical Precaution Booklet Issued: No Precaution Comments: cortrak, bilateral soft mitts Required Braces or Orthoses: Sling, Cervical Brace Cervical Brace: At all times, Hard  collar Restrictions Weight Bearing Restrictions: Yes RUE  Weight Bearing: Non weight bearing Other Position/Activity Restrictions: in sling, nonoperative mgmt per ortho 9/18   Therapy/Group: Individual Therapy  Dominic Smith Dominic Smith PT, DPT, NCS, CBIS  03/03/2022, 7:48 PM

## 2022-03-03 NOTE — Progress Notes (Signed)
PROGRESS NOTE   Subjective/Complaints:  Pt participating more with therapy. Schedule increased to 15/7. Pt seemed to sleep last night. Had Midline placed for IV access yesterday  ROS: Limited due to cognitive/behavioral   Objective:   No results found.  Recent Labs    03/02/22 0559  WBC 7.4  HGB 10.5*  HCT 32.1*  PLT 264      Recent Labs    03/02/22 0559  NA 140  K 4.0  CL 111  CO2 23  GLUCOSE 102*  BUN 28*  CREATININE 1.09  CALCIUM 9.2      Intake/Output Summary (Last 24 hours) at 03/03/2022 0848 Last data filed at 03/03/2022 [id number] Gross per 24 hour  Intake 1606.75 ml  Output 1200 ml  Net 406.75 ml      Pressure Injury 01/13/22 Coccyx Mid Unstageable - Full thickness tissue loss in which the base of the injury is covered by slough (yellow, tan, gray, green or brown) and/or eschar (tan, brown or black) in the wound bed. skin is broken with redness at the (Active)  01/13/22 1930  Location: Coccyx  Location Orientation: Mid  Staging: Unstageable - Full thickness tissue loss in which the base of the injury is covered by slough (yellow, tan, gray, green or brown) and/or eschar (tan, brown or black) in the wound bed.  Wound Description (Comments): skin is broken with redness at the base  Present on Admission: Yes (present on admission to rehab 9/27)    Physical Exam: Vital Signs Blood pressure (!) 167/78, pulse 76, temperature 97.6 F (36.4 C), temperature source Oral, resp. rate 16, height 5\' 11"  (1.803 m), weight 56.7 kg, SpO2 100 %.   Constitutional: No distress . Vital signs reviewed. HEENT: NCAT, EOMI, oral membranes moist Neck: supple Cardiovascular: RRR without murmur. No JVD    Respiratory/Chest: CTA Bilaterally without wheezes or rales. Normal effort    GI/Abdomen: BS +, non-tender, non-distended Ext: no clubbing, cyanosis, or edema Psych: pleasant and cooperative  Skin: wounds at knees,  both hands, left elbow, right ankle.  Small coccyx wound is present, minimal drainage, appears decreased in size from last week   Neuro: speech pretty clear. Still confused. Oriented to person only today. Follows basic commands  Moves all 4 limbs spontaneously but difficut to grade.    Musculoskeletal: no focal jt pain, nl prom. Right arm moving more freely although he has limitations with ROM at shoulder    Assessment/Plan: 1. Functional deficits which require 3+ hours per day of interdisciplinary therapy in a comprehensive inpatient rehab setting. Physiatrist is providing close team supervision and 24 hour management of active medical problems listed below. Physiatrist and rehab team continue to assess barriers to discharge/monitor patient progress toward functional and medical goals  Care Tool:  Bathing        Body parts bathed by helper: Right arm, Left arm, Chest, Abdomen, Front perineal area, Buttocks, Right upper leg, Left upper leg, Right lower leg, Left lower leg, Face     Bathing assist Assist Level: 2 Helpers     Upper Body Dressing/Undressing Upper body dressing   What is the patient wearing?: Pull over shirt    Upper  body assist Assist Level: Dependent - Patient 0%    Lower Body Dressing/Undressing Lower body dressing            Lower body assist Assist for lower body dressing: 2 Helpers     Toileting Toileting    Toileting assist Assist for toileting: Total Assistance - Patient < 25%     Transfers Chair/bed transfer  Transfers assist     Chair/bed transfer assist level: Moderate Assistance - Patient 50 - 74%     Locomotion Ambulation   Ambulation assist   Ambulation activity did not occur: Safety/medical concerns  Assist level: Moderate Assistance - Patient 50 - 74% Assistive device: Hand held assist Max distance: 180   Walk 10 feet activity   Assist  Walk 10 feet activity did not occur: Safety/medical concerns  Assist level:  Moderate Assistance - Patient - 50 - 74% Assistive device: Hand held assist   Walk 50 feet activity   Assist Walk 50 feet with 2 turns activity did not occur: Safety/medical concerns  Assist level: Moderate Assistance - Patient - 50 - 74% Assistive device: Hand held assist    Walk 150 feet activity   Assist Walk 150 feet activity did not occur: Safety/medical concerns  Assist level: Moderate Assistance - Patient - 50 - 74% Assistive device: Hand held assist    Walk 10 feet on uneven surface  activity   Assist Walk 10 feet on uneven surfaces activity did not occur: Safety/medical concerns         Wheelchair     Assist Is the patient using a wheelchair?: Yes Type of Wheelchair: Manual    Wheelchair assist level: Dependent - Patient 0% (TIS)      Wheelchair 50 feet with 2 turns activity    Assist    Wheelchair 50 feet with 2 turns activity did not occur: Safety/medical concerns       Wheelchair 150 feet activity     Assist  Wheelchair 150 feet activity did not occur: Safety/medical concerns       Blood pressure (!) 167/78, pulse 76, temperature 97.6 F (36.4 C), temperature source Oral, resp. rate 16, height 5\' 11"  (1.803 m), weight 56.7 kg, SpO2 100 %.  Medical Problem List and Plan: 1. 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  Antithrombotics: -DVT/anticoagulation:  Pharmaceutical: Lovenox 30 mg BID             -antiplatelet therapy: aspirin 81 mg daily 3. Pain Management: continue Tylenol per tube scheduled,\; oxycodone, Robaxin prn  10/23 Pt denies pain this AM 4. 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 Neuropsych/cognition: This patient is not capable of making decisions on his own behalf.               6. Skin/Wound Care: routine skin care checks             -monitor scalp  lac/skin abrasions             -appreciate WOC recs for unstageable sacral/coccyx pressure wound   -continue medihoney/foam dressing 7. Fluids/Electrolytes/Nutrition/dysphagia: Strict Is and Os and follow-up chemistries             -MBS --diet initiated D2/thins  10/12-decided to keep cortrak in for now, decreased HS TF  -pt appears to have eaten a little more this morning  -discussed PO intake with pt. Demonstrated SOME insight  -BMET WNL  -added megace to help stimulate appetite on 10/12   -11/9 intake of solids has improved. Still not maintaining fluids   -midline placed for admin of IVF at HS. He will need this when he leaves here. 8: Right distal clavicle and humerus fracture:    -10/25 Xrays reviewed by ortho today, continue sling and NWB RUE recommended.  F/u Dr. Carola Frost 3-4 weeks 9: Right 1 st rib fracture: pneumothorax resolved; IS/FV 10: C5-C6, C4 and C7 transverse process fractures:  -collar removed 11: Possible right VA and ICA injuries: continue daily aspirin 13: Urinary retention: has indwelling Foley catheter             -cardura stopped d/t hypotension.   -retrial of flomax without drop in BP  -appreciate urology help in replacing foley  -voiding trial after discharge 15: SAH:  completed 7 days of Keppra; follow-up with neurosurgery 16: Volume overload/peripheral edema: improved 18: Elevated transaminases: trending downward  -AST 28 and ALT 34 10/6, improved 19:  ?aspiration pneumonia w/x hx of staph and pseudomonas - vancomycin and cefepime total of 7 more days ended 10/5    20: SVT/sinus tach intermittently: monitor 21: Mediastinal hematoma: Echo WNL; Hgb stable 22: Ankle edema and ecchymosis: x-rays negative for osseous  abnormality           Continue TEDS, elevation in bed 23. Loose stools improved.     - LBM 11/7, large formed 24. Normocytic anemia/ABLA: likely related to hospitalization and chronic disease  -no gross blood loss  continue b complex and iron  -11/8 hgb holding at 10.5 25. Hypoglycemia:     -he's not diabetic. Dc'ed cbg checks.  26. Hypokalemia  -10/30 Stable at K+ 3.8 27. HTN  -10/31 Started metoprolol 25mg  BID -overall improved with some periodic elevations    LOS: 43 days A FACE TO FACE EVALUATION WAS PERFORMED  03/03/2022, 8:48 AM

## 2022-03-03 NOTE — Progress Notes (Signed)
Speech Language Pathology Weekly Progress and Session Note  Patient Details  Name: Dominic Smith MRN: [id number] Date of Birth: 14-Feb-1941  Beginning of progress report period: February 24, 2022 End of progress report period: March 03, 2022  Today's Date: 03/03/2022 SLP Individual Time: 1020-1100 SLP Individual Time Calculation (min): 40 min  Short Term Goals: Week 6: SLP Short Term Goal 1 (Week 6): Patient will consume current diet with minimal overt s/s of aspiration and sup verbal cues for use of swallowing compensatory strategies. SLP Short Term Goal 1 - Progress (Week 6): Met SLP Short Term Goal 2 (Week 6): Patient will orient to place, month and situation with Max A multimodal cues. SLP Short Term Goal 2 - Progress (Week 6): Not met SLP Short Term Goal 3 (Week 6): Patient will demonstate sustained attenton to functional tasks for 7 minutes with Max A multimodal cues for redirection. SLP Short Term Goal 3 - Progress (Week 6): Not met    New Short Term Goals: Week 7: SLP Short Term Goal 1 (Week 7): Patient will orient to place, month and situation with Max A multimodal cues. SLP Short Term Goal 2 (Week 7): Patient will demonstate sustained attenton to functional tasks for 7 minutes with Max A multimodal cues for redirection. SLP Short Term Goal 3 (Week 7): Patient will intitiate tasks with Mod verbal cues in 75% of opportunities. SLP Short Term Goal 4 (Week 7): Patient will consume current diet with minimal overt s/s of aspiration and Mod I for use of swallowing compensatory strategies.  Weekly Progress Updates: Patient continues to make slow but steady gains and has met 1 of 3 STGs this reporting period. Currently, patient is consuming Dys. 3 textures with thin liquids with minimal overt s/s of aspiration and overall supervision level verbal cues for use of swallowing compensatory strategies. Overall, patient continues to demonstrate behaviors consistent with a Rancho Level V-VI and  requires overall Max-Total A multimodal cues to complete functional and familiar tasks safely in regard to orientation with use of aids, intellectual awareness, sustained attention, initiation and overall problem solving. Patient and family education ongoing. Patient would benefit from continued skilled SLP intervention to maximize his swallowing and cognitive functioning prior to discharge.     Intensity: Minumum of 1-2 x/day, 30 to 90 minutes Frequency: 3 to 5 out of 7 days Duration/Length of Stay: TBD due to SNF placement Treatment/Interventions: Cognitive remediation/compensation;Dysphagia/aspiration precaution training;Speech/Language facilitation;Internal/external aids;Cueing hierarchy;Environmental controls;Therapeutic Activities;Functional tasks;Patient/family education   Daily Session  Skilled Therapeutic Interventions:  Skilled treatment session focused on cognitive goals. Upon arrival, patient was awake in bed and had been listening to the news. Due to this, patient with significant language of confusion with verbal perseveration regarding "politics" and "coming together." Patient required total A for orientation to place, time and situation with inability to utilize external aids due to decreased attention to task/topic. SLP also attempted a basic sorting task in which patient required total A for problem solving and attention and was unable to be redirected to task due to verbal perseveration. Patient left upright in bed with alarm on, mitts in place, radio on to help decrease verbal perseveration.  Continue with current plan of care.     Pain No/Denies Pain   Therapy/Group: Individual Therapy  Aella Ronda 03/03/2022, 6:37 AM

## 2022-03-03 NOTE — Progress Notes (Signed)
Orthopedic Tech Progress Note Patient Details:  Dominic Smith November 12, 1940 [id number]  Abdominal binder is at bedside.   Ortho Devices Type of Ortho Device: Abdominal binder Ortho Device/Splint Location: at bedside Ortho Device/Splint Interventions: Ordered   Post Interventions Patient Tolerated: Other (comment) Instructions Provided: Other (comment)  Docia Furl 03/03/2022, 2:00 PM

## 2022-03-03 NOTE — NC FL2 (Signed)
[place] MEDICAID FL2 LEVEL OF CARE SCREENING TOOL     IDENTIFICATION  Patient Name: Dominic Smith Birthdate: Nov 20, 1940 Sex: male Admission Date (Current Location): 01/19/2022  [facility] and [place] Number:  Producer, television/film/video and Address:  [facility], [address] [place], [place] [address]      Provider Number: [id number]  Attending Physician Name and Address:  Ranelle Oyster, MD  Relative Name and Phone Number:  Sharron Simpson (son)#[phone]    Current Level of Care: Hospital Recommended Level of Care: Skilled Nursing Facility Prior Approval Number:    Date Approved/Denied:   PASRR Number: [id number]  Discharge Plan: SNF    Current Diagnoses: Patient Active Problem List   Diagnosis Date Noted   Closed fracture of head of right humerus    Azotemia    Urinary retention    Hypervolemia    Protein-calorie malnutrition, severe 01/19/2022   TBI (traumatic brain injury) (HCC) 01/19/2022   HCAP (healthcare-associated pneumonia) 01/19/2022   Rib fracture 01/10/2022   Acute cystitis with hematuria    AKI (acute kidney injury) (HCC) 05/15/2021   Acute lower UTI 05/15/2021   Dehydration 05/15/2021   Heart murmur 05/15/2021   Mild aortic valve stenosis: Mild to moderate aortic valve stenosis per 2D echo 05/15/2021 05/15/2021   Acute metabolic encephalopathy 05/14/2021    Orientation RESPIRATION BLADDER Height & Weight     Self  Normal Indwelling catheter Weight: 125 lb (56.7 kg) Height:  5\' 11"  (180.3 cm)  BEHAVIORAL SYMPTOMS/MOOD NEUROLOGICAL BOWEL NUTRITION STATUS      Incontinent Diet (D3 thin)  AMBULATORY STATUS COMMUNICATION OF NEEDS Skin   Limited Assist Verbally Normal                       Personal Care Assistance Level of Assistance  Bathing, Feeding, Dressing Bathing Assistance: Limited assistance Feeding assistance: Limited assistance Dressing Assistance: Limited assistance     Functional Limitations Info   Sight, Hearing, Speech Sight Info: Adequate Hearing Info: Adequate Speech Info: Adequate    SPECIAL CARE FACTORS FREQUENCY  PT (By licensed PT), OT (By licensed OT), Speech therapy     PT Frequency: 5xs a week OT Frequency: 5xs a week     Speech Therapy Frequency: 5xs a week      Contractures Contractures Info: Not present    Additional Factors Info  Allergies, Code Status, Psychotropic Code Status Info: Full Allergies Info: NKA Psychotropic Info: See d/c instructions         Current Medications (03/03/2022):  This is the current hospital active medication list Current Facility-Administered Medications  Medication Dose Route Frequency Provider Last Rate Last Admin   0.45 % sodium chloride infusion   Intravenous Continuous 13/12/2021, MD   Stopped at 03/03/22 [id number]   0.9 %  sodium chloride infusion   Intravenous PRN 0272, MD   Stopped at 02/08/22 1910   acetaminophen (TYLENOL) tablet 325-650 mg  325-650 mg Per Tube Q4H PRN 02/10/22, PA-C   650 mg at 02/21/22 1008   alum & mag hydroxide-simeth (MAALOX/MYLANTA) 200-200-20 MG/5ML suspension 30 mL  30 mL Oral Q4H PRN 11-01-2000, PA-C       aspirin chewable tablet 81 mg  81 mg Oral Daily Milinda Antis, MD   81 mg at 03/03/22 13/09/23   B-complex with vitamin C tablet 1 tablet  1 tablet Oral Daily 5366, MD   1 tablet  at 03/03/22 0925   Chlorhexidine Gluconate Cloth 2 % PADS 6 each  6 each Topical 2 times per day Ranelle Oyster, MD   6 each at 03/03/22 0646   diphenoxylate-atropine (LOMOTIL) 2.5-0.025 MG per tablet 1 tablet  1 tablet Per Tube QID PRN Jacquelynn Cree, PA-C   1 tablet at 01/25/22 0524   enoxaparin (LOVENOX) injection 30 mg  30 mg Subcutaneous Q12H Milinda Antis, PA-C   30 mg at 03/03/22 9563   feeding supplement (ENSURE ENLIVE / ENSURE PLUS) liquid 237 mL  237 mL Oral TID BM Ranelle Oyster, MD   237 mL at 03/03/22 8756   Gerhardt's butt cream   Topical BID Milinda Antis, PA-C   Given at 03/03/22 4332   guaiFENesin (ROBITUSSIN) 100 MG/5ML liquid 10 mL  10 mL Oral Q6H PRN Ranelle Oyster, MD       leptospermum manuka honey (MEDIHONEY) paste 1 Application  1 Application Topical Daily Ranelle Oyster, MD   1 Application at 03/01/22 0748   megestrol (MEGACE) 400 MG/10ML suspension 400 mg  400 mg Oral BID Fanny Dance, MD   400 mg at 03/03/22 0925   methylphenidate (RITALIN) tablet 15 mg  15 mg Oral Daily Fanny Dance, MD   15 mg at 03/03/22 0925   metoprolol tartrate (LOPRESSOR) tablet 25 mg  25 mg Oral BID Fanny Dance, MD   25 mg at 03/03/22 9518   Oral care mouth rinse  15 mL Mouth Rinse 4 times per day Ranelle Oyster, MD   15 mL at 03/03/22 8416   Oral care mouth rinse  15 mL Mouth Rinse PRN Ranelle Oyster, MD       polyethylene glycol (MIRALAX / GLYCOLAX) packet 17 g  17 g Per Tube Daily Ranelle Oyster, MD   17 g at 03/02/22 0856   QUEtiapine (SEROQUEL) tablet 50 mg  50 mg Oral Q8H PRN Ranelle Oyster, MD   50 mg at 03/02/22 1517   sodium chloride flush (NS) 0.9 % injection 10-40 mL  10-40 mL Intracatheter Q12H Faith Rogue T, MD   10 mL at 03/03/22 0927   sodium chloride flush (NS) 0.9 % injection 10-40 mL  10-40 mL Intracatheter PRN Ranelle Oyster, MD       tamsulosin [facility]) capsule 0.4 mg  0.4 mg Oral QPC supper Faith Rogue T, MD   0.4 mg at 03/02/22 1717   traMADol (ULTRAM) tablet 25 mg  25 mg Oral Q6H PRN Ranelle Oyster, MD   25 mg at 03/02/22 1517     Discharge Medications: Please see discharge summary for a list of discharge medications.  Relevant Imaging Results:  Relevant Lab Results:   Additional Information [id number]  Gretchen Short, LCSW

## 2022-03-03 NOTE — Progress Notes (Addendum)
Patient ID: Dominic Smith, male   DOB: Aug 28, 1940, 81 y.o.   MRN: [id number]  SW sent out SNF referral.   Preferred SNF locations:  1) [facility]-declined 2) [facility]-pending [facility] 4) [facility]- declined as facility full 5) [facility]pending 6)[facility] 7 ) [facility]pending 8) [facility]-pending  Cecile Sheerer, MSW, [place] Office: [phone] Cell: [phone] Fax: [phone]

## 2022-03-04 LAB — CBC
HCT: 29.5 % — ABNORMAL LOW (ref 39.0–52.0)
Hemoglobin: 9.3 g/dL — ABNORMAL LOW (ref 13.0–17.0)
MCH: 28.3 pg (ref 26.0–34.0)
MCHC: 31.5 g/dL (ref 30.0–36.0)
MCV: 89.7 fL (ref 80.0–100.0)
Platelets: 204 10*3/uL (ref 150–400)
RBC: 3.29 MIL/uL — ABNORMAL LOW (ref 4.22–5.81)
RDW: 14.6 % (ref 11.5–15.5)
WBC: 6.9 10*3/uL (ref 4.0–10.5)
nRBC: 0 % (ref 0.0–0.2)

## 2022-03-04 LAB — GLUCOSE, CAPILLARY: Glucose-Capillary: 113 mg/dL — ABNORMAL HIGH (ref 70–99)

## 2022-03-04 NOTE — Progress Notes (Signed)
Occupational Therapy Session Note  Patient Details  Name: Dominic Smith MRN: [id number] Date of Birth: 1941-04-23  Today's Date: 03/04/2022 OT Individual Time: 1335-1405 OT Individual Time Calculation (min): 30 min    Short Term Goals: Week 7:  OT Short Term Goal 1 (Week 7): STG=LTG d/t ELOS  Skilled Therapeutic Interventions/Progress Updates:    Pt resting in TIS w/c at nursing station. OT intervention with focus of following one step commands, orientation, activity tolerance, and safety awareness. Sit<>stand with min A. Attempted to engage pt in ball toss activity but pt unable to follow commands. Pt oriented to upcoming holiday with max verbal cues. Pt became fixated on turkeys. Pt returned to nursing station. Belt alarm activated.   Therapy Documentation Precautions:  Precautions Precautions: Fall, Cervical Precaution Booklet Issued: No Precaution Comments: cortrak, bilateral soft mitts Required Braces or Orthoses: Sling, Cervical Brace Cervical Brace: At all times, Hard collar Restrictions Weight Bearing Restrictions: Yes RUE Weight Bearing: Non weight bearing Other Position/Activity Restrictions: in sling, nonoperative mgmt per ortho 9/18    Pain: Pain Assessment Pain Score: 0-No pain   Therapy/Group: Individual Therapy  Rich Brave 03/04/2022, 2:08 PM

## 2022-03-04 NOTE — Progress Notes (Signed)
Speech Language Pathology TBI Note  Patient Details  Name: Dominic Smith MRN: [id number] Date of Birth: 1940/08/29  Today's Date: 03/04/2022 SLP Individual Time: 1216-1300 SLP Individual Time Calculation (min): 44 min  Short Term Goals: Week 7: SLP Short Term Goal 1 (Week 7): Patient will orient to place, month and situation with Max A multimodal cues. SLP Short Term Goal 2 (Week 7): Patient will demonstate sustained attenton to functional tasks for 7 minutes with Max A multimodal cues for redirection. SLP Short Term Goal 3 (Week 7): Patient will intitiate tasks with Mod verbal cues in 75% of opportunities. SLP Short Term Goal 4 (Week 7): Patient will consume current diet with minimal overt s/s of aspiration and Mod I for use of swallowing compensatory strategies.  Skilled Therapeutic Interventions:Skilled ST services focused on swallow and cognitive skills. SLP facilitated PO consumption of dys 3 textures and thin liquids via straw on lunch tray. Pt demonstrated ability to self-feed meal with mod I use of swallow strategies and no overt s/s aspiration. Pt then became tangential and required mod A increase to max A verbal cues for initiation of self-feeding ice cream and redirecting attention. Pt demonstrated sustained attention in less than 1 minute interval on structured task such as sorting cards by two colors. Pt was orientated to hospital and self. Pt was perseverative on family and conspiracy theories. Pt was left behind nurse's station in chair. SLP recommends to continue skilled services.     Pain Pain Assessment Pain Score: 0-No pain  Agitated Behavior Scale: TBI Observation Details Observation Environment: ST room Start of observation period - Date: 03/04/22 Start of observation period - Time: 1216 End of observation period - Date: 03/04/22 End of observation period - Time: 1300 Agitated Behavior Scale (DO NOT LEAVE BLANKS) Short attention span, easy distractibility,  inability to concentrate: Present to an extreme degree Impulsive, impatient, low tolerance for pain or frustration: Absent Uncooperative, resistant to care, demanding: Absent Violent and/or threatening violence toward people or property: Absent Explosive and/or unpredictable anger: Absent Rocking, rubbing, moaning, or other self-stimulating behavior: Absent Pulling at tubes, restraints, etc.: Absent Wandering from treatment areas: Absent Restlessness, pacing, excessive movement: Absent Repetitive behaviors, motor, and/or verbal: Present to a moderate degree Rapid, loud, or excessive talking: Present to a moderate degree Sudden changes of mood: Absent Easily initiated or excessive crying and/or laughter: Present to a slight degree Self-abusiveness, physical and/or verbal: Absent Agitated behavior scale total score: 22  Therapy/Group: Individual Therapy  Nayra Coury  [facility] 03/04/2022, 1:56 PM
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
## 2022-03-04 NOTE — Progress Notes (Signed)
Occupational Therapy TBI Note  Patient Details  Name: Dominic Smith MRN: [id number] Date of Birth: [birth year]  Today's Date: 03/04/2022 OT Individual Time: 1135-1200 OT Individual Time Calculation (min): 25 min    Short Term Goals: Week 7:  OT Short Term Goal 1 (Week 7): STG=LTG d/t ELOS  Skilled Therapeutic Interventions/Progress Updates:    Pt resting in TIS w/c at nursing station upon arrival. Pt's 2 sons present. Intial focus on orientation to sons and current personal situation. Pt transitioned to gym and attempted to engage pt in standing tasks. Sit<>stand  x2 with min A but pt unable to follow directions for simple task. Pt returned to nursing station. Belt alarm activated.   Therapy Documentation Precautions:  Precautions Precautions: Fall, Cervical Precaution Booklet Issued: No Precaution Comments: cortrak, bilateral soft mitts Required Braces or Orthoses: Sling, Cervical Brace Cervical Brace: At all times, Hard collar Restrictions Weight Bearing Restrictions: Yes RUE Weight Bearing: Non weight bearing Other Position/Activity Restrictions: in sling, nonoperative mgmt per ortho 9/18 Pain:  Pt denies pain this afternoon   Therapy/Group: Individual Therapy  Rich Brave 03/04/2022, 7:01 AM
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
## 2022-03-04 NOTE — Progress Notes (Signed)
Physical Therapy Session Note  Patient Details  Name: Dominic Smith MRN: [id number] Date of Birth: 01-18-1941  Today's Date: 03/04/2022 PT Individual Time: 0902-0959 PT Individual Time Calculation (min): 57 min   Short Term Goals: Week 6:  PT Short Term Goal 1 (Week 6): STG=LTG due to ELOS  Skilled Therapeutic Interventions/Progress Updates:     Pt received semi reclined in bed. Agrees to therapy and no complaint of pain. PT cues pt to performs supine to sit and pt had difficulty motor planning movement, requiring minA from PT to complete, with cues for sequencing and positioning at EOB. Once seated, PT provides totalA to don thigh high Ted hose and abdominal binder. Pt performs stand step transfer to [facility] with minA and cues for positioning and reminder to keep NWB through R upper extremity. WC transport to gym for time management. Pt ambulates x350' without AD, with minA at trunk for stability due to pt demonstrating consistent R sided lean. PT provides cues for increasing stride length and step height to decrease risk for falls. Additional cueing provided for safe transition back to WC. Pt takes seated rest break. No complaint of dizziness or fatigue. Following rest break, pt ambulates additional 350'. Initially pt ambulates with CGA, but requires max cueing for navigation, and during gait has multiple LOBs requiring up to modA to maintain safe upright posture. WC transport back to RN station. Pt left with RN supervision.  Therapy Documentation Precautions:  Precautions Precautions: Fall, Cervical Precaution Booklet Issued: No Precaution Comments: cortrak, bilateral soft mitts Required Braces or Orthoses: Sling, Cervical Brace Cervical Brace: At all times, Hard collar Restrictions Weight Bearing Restrictions: Yes RUE Weight Bearing: Non weight bearing Other Position/Activity Restrictions: in sling, nonoperative mgmt per ortho 9/18   Therapy/Group: Individual Therapy  Beau Fanny, PT, DPT 03/04/2022, 5:22 PM

## 2022-03-05 NOTE — Progress Notes (Signed)
Pt is alert and awake more through the night with noted confusion and restlessness and asking questions about going home and irritated at times. Frequent rounding done for safety PO fluids offered and tolerated well. Pt finished entire Ensure over the night and had 2 med smooth Bowel movements. Mittens in place and all lines and tubes are connected and maintained. Pt needs addressed. Floor mats in place and bed alarm is activated.

## 2022-03-05 NOTE — Progress Notes (Signed)
Occupational Therapy Session Note  Patient Details  Name: Carlson Belland MRN: [id number] Date of Birth: 1940-07-07  Today's Date: 03/05/2022 OT Individual Time: 1045-1055 OT Individual Time Calculation (min): 10 min    Short Term Goals: Week 7:  OT Short Term Goal 1 (Week 7): STG=LTG d/t ELOS  Skilled Therapeutic Interventions/Progress Updates: Patient presented supine in bed with head of bed elevated and hand mitts donned.   He spoke disoriented to this clinician and at times as if speaking to himself or someone else.  Topics were not related to situation at hand and topics varied.  After mitts were doffed by this clinician, With initial Minimal hand over hand assist, faded to set up to motor plan moving the wash cloth and locate face, he was able to follow command to wash right side of face.  With verbal cue he washed left side of face.  With setup he was able to use sponge to cleanse mouth.  After cleansing mouth, he returned to speaking about varied topics and was disoriented.  "Hospital" was taped to window that he faced; however, when asked the location and situation, he continued speaking of other topics.  After oral care, he was not able to follow further commands.  This clinician redonned his hand mits, and his bed alarm was left engaged.  Nursing staff asked that patient remain in bed after session due to no availability for constant supervision at nursing staff at the conclusion on his OT session.  In his room in bed telesitter supervision monitored for safety.  Patient was scheduled for another therapy 2 hours later and available supervision would be reassessed at that time.  Offer OT next scheduled date/time.  Continue to address OT POC as applicable.     Therapy Documentation Precautions:  Precautions Precautions: Fall, Cervical Precaution Booklet Issued: No Precaution Comments: cortrak, bilateral soft mitts Required Braces or Orthoses: Sling, Cervical Brace Cervical Brace: At  all times, Hard collar Restrictions Weight Bearing Restrictions: Yes RUE Weight Bearing: Non weight bearing Other Position/Activity Restrictions: in sling, nonoperative mgmt per ortho 9/18 General: General OT Amount of Missed Time: 50 Minutes Unable to focus and follow commands except to wash face and cleans mouth with sponge with hand over hand assist for face  Pain: patient distracted and did not answer.  No obvious signs of distress    Therapy/Group: Individual Therapy  Bud Face [facility] 03/05/2022, 4:54 PM

## 2022-03-05 NOTE — Progress Notes (Signed)
Physical Therapy Session Note  Patient Details  Name: Dominic Smith MRN: [id number] Date of Birth: 1940-08-14  Today's Date: 03/05/2022 PT Individual Time: 1300-1345 PT Individual Time Calculation (min): 45 min   Short Term Goals: Week 6:  PT Short Term Goal 1 (Week 6): STG=LTG due to ELOS  Skilled Therapeutic Interventions/Progress Updates:     Patient in bed finishing lunch with NT providing total A for feeding upon PT arrival. Patient alert and agreeable to PT session. Patient denied pain during session.  Donned B thigh high TED hose and paper scrub pants with total A bed level for energy time management with limited patient initiation due to motor planning deficits and decreased attention.   Therapeutic Activity: Bed Mobility: Patient performed supine to/from sit with min A with use of hospital bed features. Provided max verbal cues for initiation with limited response until assistance provided. Transfers: Patient performed sit to/from stand x5 with min A progressing to close supervision without an AD. Provided verbal cues for initiation, forward weight shift, completion of turns and controlled descent.  Gait Training:  Patient ambulated >100 feet x2 without an AD with min A. Ambulated with narrow BOS, shuffling gait, and poor visual scanning L>R. Provided verbal cues for erect posture, visual targets for change in direction, and increased BOS.  Neuromuscular Re-ed: Berg Balance Test Sit to Stand: Able to stand using hands after several tries Standing Unsupported: Able to stand 30 seconds unsupported Sitting with Back Unsupported but Feet Supported on Floor or Stool: Able to sit 2 minutes under supervision Stand to Sit: Sits independently, has uncontrolled descent Transfers: Needs one person to assist Standing Unsupported with Eyes Closed: Able to stand 3 seconds Standing Ubsupported with Feet Together: Needs help to attain position and unable to hold for 15 seconds From  Standing, Reach Forward with Outstretched Arm: Loses balance while trying/requires external support From Standing Position, Pick up Object from Floor: Unable to try/needs assist to keep balance From Standing Position, Turn to Look Behind Over each Shoulder: Needs assist to keep from losing balance and falling Turn 360 Degrees: Needs assistance while turning Standing Unsupported, Alternately Place Feet on Step/Stool: Needs assistance to keep from falling or unable to try Standing Unsupported, One Foot in Front: Loses balance while stepping or standing Standing on One Leg: Unable to try or needs assist to prevent fall Total Score: 11/56 (improved from 4/56 on 10/5) Patient demonstrated increased fall risk noted by score of 11/56 on the Berg Balance Scale.  <45/56 = fall risk, <42/56 = predictive of recurrent falls, <40/56 = 100% fall risk  >41 = independent, 21-40 = assistive device, 0-20 = wheelchair level  MDC 6.9 (4 pts 45-56, 5 pts 35-44, 7 pts 25-34) (ANPTA Core Set of Outcome Measures for Adults with Neurologic Conditions, 2018)  Patient in bed at end of session with breaks locked, bed alarm set, and all needs within reach.   Therapy Documentation Precautions:  Precautions Precautions: Fall, Cervical Precaution Booklet Issued: No Precaution Comments: cortrak, bilateral soft mitts Required Braces or Orthoses: Sling, Cervical Brace Cervical Brace: At all times, Hard collar Restrictions Weight Bearing Restrictions: Yes RUE Weight Bearing: Non weight bearing Other Position/Activity Restrictions: in sling, nonoperative mgmt per ortho 9/18    Therapy/Group: Individual Therapy  Abria Vannostrand L Sandrine Bloodsworth PT, DPT, NCS, CBIS  03/05/2022, 3:48 PM

## 2022-03-05 NOTE — Progress Notes (Signed)
Speech Language Pathology TBI Note  Patient Details  Name: Dominic Smith MRN: [id number] Date of Birth: 1940/09/18  Today's Date: 03/05/2022 SLP Individual Time: 0840-0920 SLP Individual Time Calculation (min): 40 min  Short Term Goals: Week 7: SLP Short Term Goal 1 (Week 7): Patient will orient to place, month and situation with Max A multimodal cues. - Initially, pt benefited from Total A to verbalize orientation to place and month despite SLP providing Max A multimodal cues to include visual supports posted in room. Demonstrated recall for location only when given Max A multimodal cues following 1-minute delay without distractions x 2. Continued to require Total A for month despite Max A multimodal cues, multiple rehearsals and use of spaced retrieval techniques.   SLP Short Term Goal 2 (Week 7): Patient will demonstate sustained attenton to functional tasks for 7 minutes with Max A multimodal cues for redirection. -  Pt demonstrated sustained attention to functional task (folding washcloths - last 2 steps) for ~5 minutes given Max to Total A multimodal cues for redirection.   SLP Short Term Goal 3 (Week 7): Patient will intitiate tasks with Mod verbal cues in 75% of opportunities. - Pt initiated motor movements to complete functional ADL task of folding washcloths (last 2 steps of process - folding and placing in completed pile) in 0% of opportunities given Mod A; only 10% with Max A multimodal cues. All other trials required Total (hand-over-hand) assistance.  SLP Short Term Goal 4 (Week 7): Patient will consume current diet with minimal overt s/s of aspiration and Mod I for use of swallowing compensatory strategies. - Did not formally address this session. No difficulty observed during med pass with RN.  Skilled Therapeutic Interventions: S: Pt seen this date for skilled ST intervention targeting cognitive goals outlined above. Pt received awake, though lethargic and lying in bed. Flat  affect noted. Denies pain. Agreeable to ST intervention at bedside.   O: Please see above for objective data re: pt's performance on targeted goals. Education limited due to severity of pt's cognitive deficits. No family present. Continues to exhibit language of confusion and required Total to Max A to complete basic cognitive tasks during today's session.  A: Pt did not appear sitmulable for skilled ST intervention. Benefited from frequent rest breaks and minimally from use of backward chaining techniques for motor planning, sequencing, and attention. Minimal benefit observed with use of behavioral strategies to include providing visual supports for sequencing steps to complete ADL task, positive reinforcement, and use of timer/providing designated number of trials.  P: Pt left in bed with all safety measures activated. Call bell reviewed and within reach and all immediate needs met. Continue per current ST POC next session.   Pain None/Denies; NAD  Agitated Behavior Scale: TBI ABS discontinued d/t ABS score less than 20 for the last three days or no behaviors present    Therapy/Group: Individual Therapy  Dominic Smith A Dominic Smith 03/05/2022, 9:21 AM
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
## 2022-03-06 NOTE — Progress Notes (Signed)
Pt upper left midline was out upon entering on rounds. Pt now has a 22 G in the right posterior forearm. No further concerns at this time.

## 2022-03-06 NOTE — Progress Notes (Signed)
Occupational Therapy Session Note  Patient Details  Name: Keanan Melander MRN: [id number] Date of Birth: [birth year]  Today's Date: 03/06/2022 OT Individual Time: 1300-1353 OT Individual Time Calculation (min): 53 min    Short Term Goals: Week 3:  OT Short Term Goal 1 (Week 3): Pt will complete 1/3 steps of donning shirt OT Short Term Goal 1 - Progress (Week 3): Met OT Short Term Goal 2 (Week 3): Pt will compelte toilet transfer with MIN A consistently OT Short Term Goal 2 - Progress (Week 3): Progressing toward goal OT Short Term Goal 3 (Week 3): Pt will wash 75% of body with MOD cuing for sequencing OT Short Term Goal 3 - Progress (Week 3): Met OT Short Term Goal 4 (Week 3): Pt will complete 1/3 steps of dressing OT Short Term Goal 4 - Progress (Week 3): Progressing toward goal  Skilled Therapeutic Interventions/Progress Updates:    Pt greeted at time of session bed level resting but awake, no s/s of pain and did not report pain when asked. Note lunch tray arriving at this time and as pt is full supervision per nursing, focus of session on self feeding with sequencing and following commands. Supine > sit MIN A and pt sitting EOB approx 35 mins for self feeding activity. Pt needed supervision but eating 100% of all foods provided. Pt able to scoop, bring to mouth, etc with standard fork and attend to both R and L side of plate/tray. Note pt needed multimodal cues for problem solving putting butter on bread as pt trying to eat butter plain and trying initially to cut up bread but able to be redirected. Pt perseverating on paper products on tray at times but able to be redirected to self feeding tasks. Sit > stand and ambulated to sink level MIN A with LUE support around therapist and OT managing foley, able to wash hands with MIN A before returning to bed level. Note pt encouraged to minimize use of RUE and adhere to NWB but 2/2 cognitive deficits unable to follow commands. At end of session left  all needs met alarm on and telesitter in room call bell in reach.   Therapy Documentation Precautions:  Precautions Precautions: Fall, Cervical Precaution Booklet Issued: No Precaution Comments: cortrak, bilateral soft mitts Required Braces or Orthoses: Sling, Cervical Brace Cervical Brace: At all times, Hard collar Restrictions Weight Bearing Restrictions: No RUE Weight Bearing: Non weight bearing Other Position/Activity Restrictions: in sling, nonoperative mgmt per ortho 9/18     Therapy/Group: Individual Therapy  Viona Gilmore 03/06/2022, 12:18 PM

## 2022-03-07 NOTE — Progress Notes (Signed)
Patient ID: Dominic Smith, male   DOB: [birth year], 81 y.o.   MRN: [id number]  SW spoke with Eunice Blase Powell/Admissions [phone] [facility] and waiting on follow-up about referral. Willing to extend bed offer under Medicare Part A for 20 days. On day 21, pt will become private pay.   SW discussed above with pt son Judie Bonus. He states he is out of town this week, and can ask  his mother about signing admission paperwork. SW shared will speak with SNF about arrangements that can be made. He is aware SW will follow-up about if pt will transition to SNF tomorrow as pending medical team.  *medical team in agreement with d/c tomorrow.   PTAR pick up scheduled for tomorrow (11/14) at 12:30pm.   SW spoke with Debbie/Admissions to discuss above about pt son. States will need someone to come in and sign consent for treatment. SW will confirm Rm# and nurse   SW spoke with pt son Judie Bonus to confirm d/c tomorrow, and discuss above. SW informed will call pt mother to discuss coming in to sign form. SW left message for pt mother to inform on above d/c and provided contact information for facility and admissions coordinator.   Cecile Sheerer, MSW, LCSWA Office: [phone] Cell: [phone] Fax: [phone]

## 2022-03-07 NOTE — Progress Notes (Signed)
Physical Therapy Session Note  Patient Details  Name: Dominic Smith MRN: [id number] Date of Birth: [birth year]  Today's Date: 03/07/2022 PT Individual Time: 0905-0920 PT Individual Time Calculation (min): 15 min   Short Term Goals: Week 6:  PT Short Term Goal 1 (Week 6): STG=LTG due to ELOS  Skilled Therapeutic Interventions/Progress Updates:     Patient in bed starring at the window in front of him noted to be tearful upon PT arrival. Patient would make intermittent eye contact with PT and minimal head nods to questions, but nonverbal and tearful this morning. Utilized therapeutic use of self to inquire about patient's emotional state with success, provided patient with orientation, and attempted to encouraged and initiate mobility with patient. Patient did not initiate and not overtly responsive to interventions at this time. Asked patient if he would like therapist to come back later and patient provided PT with a small nod yes. Instructed patient to wipe away his tears with a tissue, patient initiated x1 without lifting his hand fully to his face. PT provided total A to wipe his tears and secured B mitts for patient safety. Patient in bed with floor mat on R side of the bed, breaks locked, bed alarm set, and all needs in reach. Patient missed 30 min of skilled PT due to current emotional state, RN made aware. Will attempt to make-up missed time as able.     Returned to see patient at 12:30, patient eating with NT sitting EOB with >50% of lunch remaining. Will continue to attempt to make up missed time as patient is available.   Therapy Documentation Precautions:  Precautions Precautions: Fall, Cervical Precaution Booklet Issued: No Precaution Comments: cortrak, bilateral soft mitts Required Braces or Orthoses: Sling, Cervical Brace Cervical Brace: At all times, Hard collar Restrictions Weight Bearing Restrictions: No RUE Weight Bearing: Non weight bearing Other Position/Activity  Restrictions: in sling, nonoperative mgmt per ortho 9/18      Therapy/Group: Individual Therapy  Raea Magallon L Tiffanni Scarfo PT, DPT, NCS, CBIS  03/07/2022, 9:42 AM

## 2022-03-07 NOTE — Progress Notes (Signed)
Occupational Therapy Discharge Summary  Patient Details  Name: Dominic Smith MRN: [id number] Date of Birth: May 29, 1940  Date of Discharge from OT service:March 07, 2022  Today's Date: 03/07/2022 OT Individual Time: 1561-5379 OT Individual Time Calculation (min): 41 min    Patient has met 9 of 12 long term goals due to improved activity tolerance, improved balance, postural control, functional use of  RIGHT upper extremity, improved attention, and improved coordination.  Patient to discharge at [facility] Assist level.  Patient's care partner unavailable to provide the necessary physical and cognitive assistance at discharge.    Reasons goals not met: Toileting rated at Dependent due to cognitive deficits and presence of indwelling catheter. Memory recall and awareness deficits continue to be prominent requiring consistent supervision and verbal and visual verbal cues to complete BADL tasks.    Recommendation:  Patient will benefit from ongoing skilled OT services in skilled nursing facility setting to continue to advance functional skills in the area of BADL.  Equipment: No equipment provided  Reasons for discharge: treatment goals met and discharge from hospital  Patient/family agrees with progress made and goals achieved: Yes OT Skilled Intervention Pt seated on EOB upon therapy arrival with NT at bedside. Pt completed OT session with focus on ADL re-training and functional mobility within room. Physically pt performs well and cognitive deficits appear to be greater. Throughout session, pt required consistent Min physical cues to initiate tasks such as washing arms/chest, removing socks from feet, etc. See below for functional performance. During ADL tasks. Pt left in bed with bed alarm active and bilateral hand mitts placed.   OT Discharge Precautions/Restrictions  Precautions Precautions: Fall Precaution Comments: bilateral soft mitts Required Braces or Orthoses:  Sling Restrictions Weight Bearing Restrictions: Yes RUE Weight Bearing: Non weight bearing Pain Pain Assessment Pain Scale: 0-10 Pain Score: 0-No pain ADL ADL Eating: Supervision/safety Grooming: Minimal assistance Where Assessed-Grooming: Sitting at sink, Wheelchair Upper Body Bathing: Minimal assistance Where Assessed-Upper Body Bathing: Shower Lower Body Bathing: Minimal assistance Where Assessed-Lower Body Bathing: Shower Upper Body Dressing: Minimal assistance Where Assessed-Upper Body Dressing: Edge of bed Lower Body Dressing: Minimal assistance Where Assessed-Lower Body Dressing: Edge of bed Toileting: Dependent (indwelling catheter) Toilet Transfer: Minimal assistance Toilet Transfer Method: Counselling psychologist: Energy manager: Curator Method: Heritage manager: Grab bars, Shower seat with back Vision Baseline Vision/History: 0 No visual deficits Patient Visual Report: No change from baseline Vision Assessment?: Yes Tracking/Visual Pursuits: Unable to hold eye position out of midline;Requires cues, head turns, or add eye shifts to track Saccades: Within functional limits Convergence: Within functional limits Visual Fields: No apparent deficits Additional Comments: cognitive deficts impact visual assessment. pt with difficulty following direction. Vision assessed during functional tasks. Perception  Perception: Impaired Inattention/Neglect: Does not attend to left side of body Praxis Praxis: Impaired Praxis Impairment Details: Perseveration;Initiation Cognition Cognition Overall Cognitive Status: Impaired/Different from baseline Arousal/Alertness: Awake/alert Orientation Level: Person Memory: Impaired Memory Impairment: Storage deficit;Decreased recall of new information;Decreased short term memory Focused Attention: Impaired Awareness: Impaired Problem Solving:  Impaired Behaviors: Restless;Perseveration;Impulsive Safety/Judgment: Impaired Rancho Los Amigos Scales of Cognitive Functioning: Confused, Inappropriate Non-Agitated Brief Interview for Mental Status (BIMS) Repetition of Three Words (First Attempt): No answer Temporal Orientation: Year: Nonsensical Temporal Orientation: Month: Nonsensical Temporal Orientation: Day: Nonsensical Recall: "Sock": No, could not recall Recall: "Blue": No, could not recall Recall: "Bed": No, could not recall BIMS Summary Score: 99 Sensation Sensation Light Touch: Appears Intact Hot/Cold: Appears  Intact Coordination Gross Motor Movements are Fluid and Coordinated: No Fine Motor Movements are Fluid and Coordinated: No Finger Nose Finger Test: not tested Motor  Motor Motor: Motor apraxia Mobility  Bed Mobility Bed Mobility: Sit to Supine Sit to Supine: Minimal Assistance - Patient > 75% Transfers Sit to Stand: Contact Guard/Touching assist Stand to Sit: Contact Guard/Touching assist  Trunk/Postural Assessment  Cervical Assessment Cervical Assessment: Exceptions to [facility] (forward head) Thoracic Assessment Thoracic Assessment: Exceptions to [facility] (rounded shoulders) Lumbar Assessment Lumbar Assessment: Exceptions to [facility] (posterior pelvic tilt) Postural Control Postural Control: Deficits on evaluation (delayed and insufficient)  Balance Balance Balance Assessed: Yes Static Sitting Balance Static Sitting - Balance Support: Feet supported Static Sitting - Level of Assistance: 5: Stand by assistance Dynamic Sitting Balance Dynamic Sitting - Balance Support: No upper extremity supported;During functional activity;Feet supported Dynamic Sitting - Level of Assistance: 5: Stand by assistance Static Standing Balance Static Standing - Balance Support: Bilateral upper extremity supported;During functional activity Static Standing - Level of Assistance: 4: Min assist Dynamic Standing Balance Dynamic  Standing - Balance Support: Bilateral upper extremity supported;During functional activity Dynamic Standing - Level of Assistance: 4: Min assist Extremity/Trunk Assessment RUE Assessment RUE Assessment: Exceptions to [facility] Passive Range of Motion (PROM) Comments: Functional P/ROM achieved Active Range of Motion (AROM) Comments: Pt able to achieve shoulder flexion to 50% range. General Strength Comments: Right humerus fx, NWB. Shoulder flexion: 3/5, IR: 3/5, er: 3-/5 LUE Assessment LUE Assessment: Within Functional Limits   Ailene Ravel, OTR/L,CBIS  Supplemental OT - [facility] and [facility]  03/07/2022, 3:53 PM

## 2022-03-07 NOTE — Progress Notes (Signed)
Speech Language Pathology TBI Note  Patient Details  Name: Dominic Smith MRN: [id number] Date of Birth: 18-May-1940  Today's Date: 03/07/2022 SLP Individual Time: 0730-0825 SLP Individual Time Calculation (min): 55 min  Short Term Goals: Week 7: SLP Short Term Goal 1 (Week 7): Patient will orient to place, month and situation with Max A multimodal cues. SLP Short Term Goal 2 (Week 7): Patient will demonstate sustained attenton to functional tasks for 7 minutes with Max A multimodal cues for redirection. SLP Short Term Goal 3 (Week 7): Patient will intitiate tasks with Mod verbal cues in 75% of opportunities. SLP Short Term Goal 4 (Week 7): Patient will consume current diet with minimal overt s/s of aspiration and Mod I for use of swallowing compensatory strategies.  Skilled Therapeutic Interventions: Skilled treatment session focused on cognitive and dysphagia goals. Upon arrival, patient was awake and alert while upright in bed. Patient agreeable to breakfast meal with SLP providing repositioning to maximize appropriate posture for self-feeding and swallowing safety. Patient self-fed 100% of his meal and demonstrated selective attention to task for ~30 minutes. Patient consumed Dys. 3 textures with thin liquids without overt s/s of aspiration with overall Min verbal cues needed for problem solving with self-feeding. At end of meal, patient stopped verbally responding to questions. Patient would intermittently acknowledge the SLP with small head nods but would not verbally respond to any question or statement. Patient then became tearful but again would not verbalize why. SLP provided emotional support and encouragement. Nursing also made aware. Patient left upright in bed with alarm on, mitts in place and all needs within reach. Continue with current plan of care.      Pain Pain Assessment Pain Scale: 0-10 Pain Score: 0-No pain Faces Pain Scale: No hurt  Agitated Behavior Scale: TBI  ABS  discontinued d/t ABS score less than 20 for the last three days or no behaviors present   Therapy/Group: Individual Therapy  Dashonna Chagnon 03/07/2022, 12:38 PM

## 2022-03-07 NOTE — Progress Notes (Signed)
PROGRESS NOTE   Subjective/Complaints:  Pulled out midline, PIV placed  ROS: Limited due to cognitive/behavioral   Objective:   No results found.  No results for input(s): "WBC", "HGB", "HCT", "PLT" in the last 72 hours.    No results for input(s): "NA", "K", "CL", "CO2", "GLUCOSE", "BUN", "CREATININE", "CALCIUM" in the last 72 hours.     Intake/Output Summary (Last 24 hours) at 03/07/2022 1019 Last data filed at 03/07/2022 0900 Gross per 24 hour  Intake 90 ml  Output 1000 ml  Net -910 ml      Pressure Injury 01/13/22 Coccyx Mid Unstageable - Full thickness tissue loss in which the base of the injury is covered by slough (yellow, tan, gray, green or brown) and/or eschar (tan, brown or black) in the wound bed. skin is broken with redness at the (Active)  01/13/22 1930  Location: Coccyx  Location Orientation: Mid  Staging: Unstageable - Full thickness tissue loss in which the base of the injury is covered by slough (yellow, tan, gray, green or brown) and/or eschar (tan, brown or black) in the wound bed.  Wound Description (Comments): skin is broken with redness at the base  Present on Admission: Yes (present on admission to rehab 9/27)    Physical Exam: Vital Signs Blood pressure 137/64, pulse 70, temperature 98.3 F (36.8 C), temperature source Oral, resp. rate 16, height 5\' 11"  (1.803 m), weight 56 kg, SpO2 100 %.   Constitutional: No distress . Vital signs reviewed. HEENT: NCAT, EOMI, oral membranes moist Neck: supple Cardiovascular: RRR without murmur. No JVD    Respiratory/Chest: CTA Bilaterally without wheezes or rales. Normal effort    GI/Abdomen: BS +, non-tender, non-distended Ext: no clubbing, cyanosis, or edema Psych: pleasant and cooperative  Skin: ext wounds healing/healed.  Small coccyx wound is present, minimal drainage, appears decreased in size from last week   Neuro: remains pleasantly  confused. Oriented to person only today. Follows basic commands  Moves all 4 limbs spontaneously but difficut to grade.    Musculoskeletal: no focal jt pain, nl prom. Right arm moving more freely although he has limitations with ROM at shoulder    Assessment/Plan: 1. Functional deficits which require 3+ hours per day of interdisciplinary therapy in a comprehensive inpatient rehab setting. Physiatrist is providing close team supervision and 24 hour management of active medical problems listed below. Physiatrist and rehab team continue to assess barriers to discharge/monitor patient progress toward functional and medical goals  Care Tool:  Bathing        Body parts bathed by helper: Right arm, Left arm, Chest, Abdomen, Front perineal area, Buttocks, Right upper leg, Left upper leg, Right lower leg, Left lower leg, Face     Bathing assist Assist Level: 2 Helpers     Upper Body Dressing/Undressing Upper body dressing   What is the patient wearing?: Pull over shirt    Upper body assist Assist Level: Contact Guard/Touching assist    Lower Body Dressing/Undressing Lower body dressing      What is the patient wearing?: Pants     Lower body assist Assist for lower body dressing: Contact Guard/Touching assist     Toileting Toileting  Toileting assist Assist for toileting: Total Assistance - Patient < 25%     Transfers Chair/bed transfer  Transfers assist     Chair/bed transfer assist level: Minimal Assistance - Patient > 75%     Locomotion Ambulation   Ambulation assist   Ambulation activity did not occur: Safety/medical concerns  Assist level: Minimal Assistance - Patient > 75% Assistive device: Hand held assist Max distance: 250 ft   Walk 10 feet activity   Assist  Walk 10 feet activity did not occur: Safety/medical concerns  Assist level: Minimal Assistance - Patient > 75% Assistive device: Hand held assist   Walk 50 feet activity   Assist Walk  50 feet with 2 turns activity did not occur: Safety/medical concerns  Assist level: Minimal Assistance - Patient > 75% Assistive device: Hand held assist    Walk 150 feet activity   Assist Walk 150 feet activity did not occur: Safety/medical concerns  Assist level: Minimal Assistance - Patient > 75% Assistive device: Hand held assist    Walk 10 feet on uneven surface  activity   Assist Walk 10 feet on uneven surfaces activity did not occur: Safety/medical concerns         Wheelchair     Assist Is the patient using a wheelchair?: Yes Type of Wheelchair: Manual    Wheelchair assist level: Dependent - Patient 0%      Wheelchair 50 feet with 2 turns activity    Assist    Wheelchair 50 feet with 2 turns activity did not occur: Safety/medical concerns   Assist Level: Dependent - Patient 0%   Wheelchair 150 feet activity     Assist  Wheelchair 150 feet activity did not occur: Safety/medical concerns   Assist Level: Dependent - Patient 0%   Blood pressure 137/64, pulse 70, temperature 98.3 F (36.8 C), temperature source Oral, resp. rate 16, height 5\' 11"  (1.803 m), weight 56 kg, SpO2 100 %.  Medical Problem List and Plan: 1. 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  Antithrombotics: -DVT/anticoagulation:  Pharmaceutical: Lovenox 30 mg BID             -antiplatelet therapy: aspirin 81 mg daily 3. Pain Management: continue Tylenol per tube scheduled,\; oxycodone, Robaxin prn  10/23 Pt denies pain this AM 4. 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 Neuropsych/cognition: This patient is not capable of making decisions on his own behalf.               6. Skin/Wound Care: routine skin care checks             -monitor scalp lac/skin abrasions             -appreciate WOC recs for unstageable sacral/coccyx pressure wound   -continue medihoney/foam dressing 7. Fluids/Electrolytes/Nutrition/dysphagia: Strict Is and Os and follow-up chemistries             -MBS --  diet initiated D2/thins  10/12-decided to keep cortrak in for now, decreased HS TF  -pt appears to have eaten a little more this morning  -discussed PO intake with pt. Demonstrated SOME insight  -BMET WNL  -added megace to help stimulate appetite on 10/12   -11/10 intake of solids has improved. Still not maintaining fluids   -midline placed for admin of IVF at HS. He will need this when he leaves here.  11/13 check labs tomorrow  -use PIV as midline pulled out 8: Right distal clavicle and humerus fracture:    -10/25 Xrays reviewed by ortho today, continue sling and NWB RUE recommended.  F/u Dr. Marcelino Scot 1-2 weeks 9: Right 1 st rib fracture: pneumothorax resolved; IS/FV 10: C5-C6, C4 and C7 transverse process fractures:  -collar removed 11: Possible right VA and ICA injuries: continue daily aspirin 13: Urinary retention: has indwelling Foley catheter             -cardura stopped d/t hypotension.   -retrial of flomax without drop in BP  -appreciate urology help in replacing foley  -voiding trial after discharge in Urology office  15: SAH:  completed 7 days of Keppra; follow-up with neurosurgery 16: Volume overload/peripheral edema: improved 18: Elevated transaminases: trending downward  -AST 28 and ALT 34 10/6, improved 19:  ?aspiration pneumonia w/x hx of staph and pseudomonas - vancomycin and cefepime total of 7 more days ended 10/5    20: SVT/sinus tach intermittently: monitor 21: Mediastinal hematoma: Echo WNL; Hgb stable 22: Ankle edema and ecchymosis: x-rays  negative for osseous abnormality           Continue TEDS, elevation in bed 23. Loose stools improved.     - LBM 11/11 formed 24. Normocytic anemia/ABLA: likely related to hospitalization and chronic disease  -no gross blood loss  continue b complex and iron    Latest Ref Rng & Units 03/04/2022    4:17 AM 03/02/2022    5:59 AM 02/21/2022    6:00 AM  CBC  WBC 4.0 - 10.5 K/uL 6.9  7.4  8.4   Hemoglobin 13.0 - 17.0 g/dL 9.3  10.5  10.6   Hematocrit 39.0 - 52.0 % 29.5  32.1  33.0   Platelets 150 - 400 K/uL 204  264  268     25. Hypoglycemia:     -he's not diabetic. Dc'ed cbg checks.  26. Hypokalemia  -10/30 Stable at K+ 3.8 27. HTN  - metoprolol 25mg  BID Vitals:   03/06/22 1932 03/07/22 0341  BP: (!) 149/77 137/64  Pulse: 76 70  Resp: 16 16  Temp: 98.6 F (37 C) 98.3 F (36.8 C)  SpO2: 100% 100%       LOS: 47 days A FACE TO FACE EVALUATION WAS PERFORMED  Meredith Staggers 03/07/2022, 10:19 AM

## 2022-03-07 NOTE — Plan of Care (Signed)
  Problem: RH Toileting Goal: LTG Patient will perform toileting task (3/3 steps) with assistance level (OT) Description: LTG: Patient will perform toileting task (3/3 steps) with assistance level (OT)  Outcome: Not Met (add Reason)   Problem: RH Memory Goal: LTG Patient will demonstrate ability for day to day recall/carry over during activities of daily living with assistance level (OT) Description: LTG:  Patient will demonstrate ability for day to day recall/carry over during activities of daily living with assistance level (OT). Outcome: Not Met (add Reason)   Problem: RH Awareness Goal: LTG: Patient will demonstrate awareness during functional activites type of (OT) Description: LTG: Patient will demonstrate awareness during functional activites type of (OT) Outcome: Not Met (add Reason)   Problem: RH Balance Goal: LTG: Patient will maintain dynamic sitting balance (OT) Description: LTG:  Patient will maintain dynamic sitting balance with assistance during activities of daily living (OT) Outcome: Completed/Met Goal: LTG Patient will maintain dynamic standing with ADLs (OT) Description: LTG:  Patient will maintain dynamic standing balance with assist during activities of daily living (OT)  Outcome: Completed/Met   Problem: RH Eating Goal: LTG Patient will perform eating w/assist, cues/equip (OT) Description: LTG: Patient will perform eating with assist, with/without cues using equipment (OT) Outcome: Completed/Met   Problem: RH Grooming Goal: LTG Patient will perform grooming w/assist,cues/equip (OT) Description: LTG: Patient will perform grooming with assist, with/without cues using equipment (OT) Outcome: Completed/Met   Problem: RH Bathing Goal: LTG Patient will bathe all body parts with assist levels (OT) Description: LTG: Patient will bathe all body parts with assist levels (OT) Outcome: Completed/Met   Problem: RH Dressing Goal: LTG Patient will perform upper body  dressing (OT) Description: LTG Patient will perform upper body dressing with assist, with/without cues (OT). Outcome: Completed/Met Goal: LTG Patient will perform lower body dressing w/assist (OT) Description: LTG: Patient will perform lower body dressing with assist, with/without cues in positioning using equipment (OT) Outcome: Completed/Met   Problem: RH Toilet Transfers Goal: LTG Patient will perform toilet transfers w/assist (OT) Description: LTG: Patient will perform toilet transfers with assist, with/without cues using equipment (OT) Outcome: Completed/Met   Problem: RH Attention Goal: LTG Patient will demonstrate this level of attention during functional activites (OT) Description: LTG:  Patient will demonstrate this level of attention during functional activites  (OT) Outcome: Completed/Met

## 2022-03-08 LAB — BASIC METABOLIC PANEL
Anion gap: 7 (ref 5–15)
BUN: 26 mg/dL — ABNORMAL HIGH (ref 8–23)
CO2: 19 mmol/L — ABNORMAL LOW (ref 22–32)
Calcium: 8.1 mg/dL — ABNORMAL LOW (ref 8.9–10.3)
Chloride: 109 mmol/L (ref 98–111)
Creatinine, Ser: 1 mg/dL (ref 0.61–1.24)
GFR, Estimated: >60 mL/min (ref >60)
Glucose, Bld: 95 mg/dL (ref 70–99)
Potassium: 3.5 mmol/L (ref 3.5–5.1)
Sodium: 135 mmol/L (ref 135–145)

## 2022-03-08 MED ORDER — MEGESTROL ACETATE 400 MG/10ML PO SUSP: 400.0000 mg | Freq: Two times a day (BID) | ORAL | 0 refills | Status: DC

## 2022-03-08 MED ORDER — ASPIRIN 81 MG PO CHEW: 81.0000 mg | CHEWABLE_TABLET | Freq: Every day | ORAL | Status: DC

## 2022-03-08 MED ORDER — B COMPLEX-C PO TABS: 1.0000 | ORAL_TABLET | Freq: Every day | ORAL | Status: DC

## 2022-03-08 MED ORDER — MEDIHONEY WOUND/BURN DRESSING EX PSTE: 1.0000 | PASTE | Freq: Every day | CUTANEOUS | Status: DC

## 2022-03-08 MED ORDER — GUAIFENESIN 100 MG/5ML PO LIQD: 10.0000 mL | Freq: Four times a day (QID) | ORAL | 0 refills | Status: DC | PRN

## 2022-03-08 MED ORDER — TAMSULOSIN HCL 0.4 MG PO CAPS: 0.4000 mg | ORAL_CAPSULE | Freq: Every day | ORAL | Status: AC

## 2022-03-08 MED ORDER — CHLORHEXIDINE GLUCONATE CLOTH 2 % EX PADS: 6.0000 | MEDICATED_PAD | Freq: Every day | CUTANEOUS | Status: DC

## 2022-03-08 MED ORDER — SODIUM CHLORIDE 0.9 % IV SOLN: 10.0000 mL | INTRAVENOUS | 0 refills | Status: DC | PRN

## 2022-03-08 MED ORDER — ALUM & MAG HYDROXIDE-SIMETH 200-200-20 MG/5ML PO SUSP: 30.0000 mL | ORAL | 0 refills | Status: AC | PRN

## 2022-03-08 MED ORDER — ACETAMINOPHEN 325 MG PO TABS: 325.0000 mg | ORAL_TABLET | ORAL | Status: AC | PRN

## 2022-03-08 MED ORDER — QUETIAPINE FUMARATE 50 MG PO TABS: 50.0000 mg | ORAL_TABLET | Freq: Three times a day (TID) | ORAL | Status: AC | PRN

## 2022-03-08 MED ORDER — SODIUM CHLORIDE 0.45 % IV SOLN: 75.0000 mL | INTRAVENOUS | 0 refills | Status: DC

## 2022-03-08 MED ORDER — GERHARDT'S BUTT CREAM: 1.0000 | TOPICAL_CREAM | Freq: Two times a day (BID) | CUTANEOUS | Status: DC

## 2022-03-08 MED ORDER — ORAL CARE MOUTH RINSE: 15.0000 mL | OROMUCOSAL | 0 refills | Status: DC | PRN

## 2022-03-08 MED ORDER — METHYLPHENIDATE HCL 5 MG PO TABS: 15.0000 mg | ORAL_TABLET | Freq: Every day | ORAL | 0 refills | Status: DC

## 2022-03-08 MED ORDER — ENSURE ENLIVE PO LIQD: 237.0000 mL | Freq: Three times a day (TID) | ORAL | 12 refills | Status: DC

## 2022-03-08 MED ORDER — METOPROLOL TARTRATE 25 MG PO TABS: 25.0000 mg | ORAL_TABLET | Freq: Two times a day (BID) | ORAL | Status: DC

## 2022-03-08 MED ORDER — DIPHENOXYLATE-ATROPINE 2.5-0.025 MG PO TABS: 1.0000 | ORAL_TABLET | Freq: Four times a day (QID) | ORAL | 0 refills | Status: DC | PRN

## 2022-03-08 MED ORDER — POLYETHYLENE GLYCOL 3350 17 G PO PACK: 17.0000 g | PACK | Freq: Every day | ORAL | 0 refills | Status: DC

## 2022-03-08 MED ORDER — ENOXAPARIN SODIUM 30 MG/0.3ML IJ SOSY: 30.0000 mg | PREFILLED_SYRINGE | Freq: Two times a day (BID) | INTRAMUSCULAR | Status: DC

## 2022-03-08 NOTE — Progress Notes (Signed)
Speech Language Pathology Discharge Summary  Patient Details  Name: Dominic Smith MRN: [id number] Date of Birth: Nov 19, 1940  Date of Discharge from SLP service:November 76, 2023  Patient has met 7 of 7 long term goals.  Patient to discharge at overall Mod;Max level.   Reasons goals not met: N/A   Clinical Impression/Discharge Summary: Patient has made slow and inconsistent gains and has met 7 of 7 LTGs this admission. Currently, patient is consuming Dys. 3 textures with thin liquids with minimal overt s/s of aspiration and requires overall supervision level verbal cues for use of swallowing compensatory strategies. Patient demonstrates behaviors consistent with a Rancho Level V and requires overall Max A multimodal cues to complete functional and familiar tasks safely in regards to orientation with use of external aids and sustained attention to functional tasks. Max-Total A multimodal cues are needed for intellectual awareness of deficits, functional problem solving, and recall of daily information. Patient remains non-agitated but continues to demonstrate intermittent language of confusion with intermittent perseveration on topics. The patient's family is unable to provide the necessary level of physical and cognitive assistance need at this time, therefore, patient will discharge to a SNF. Patient would benefit from f/u SLP services to maximize his cognitive and swallowing function in order to maximize his overall functional independence and reduce caregiver burden.   Care Partner:  Caregiver Able to Provide Assistance: No  Type of Caregiver Assistance: Physical;Cognitive  Recommendation:  Skilled Nursing facility  Rationale for SLP Follow Up: Reduce caregiver burden;Maximize cognitive function and independence;Maximize swallowing safety   Equipment: N/A   Reasons for discharge: Discharged from hospital   Patient/Family Agrees with Progress Made and Goals Achieved: Yes    [place],  [place] 03/08/2022, 6:34 AM

## 2022-03-08 NOTE — Progress Notes (Signed)
Patient ID: Dominic Smith, male   DOB: Aug 29, 1940, 81 y.o.   MRN: [id number]  SW  returned phone call and left message for pt ex wife Dominic Smith who indicated that she could not come in today due to other obligations and suggested her son make arrangements. SW left message for ex wife Dominic Smith and pt son Dominic Smith to inform that either of them will need to arrange who is able to come into the nursing home to sign consent to treat forms. No follow-up from family at this time.   SW spoke with Debbie/Admissions with [facility] to inform on working on someone coming in to sign forms today or tomorrow. Pt will go to [facility]m A[facility] bed 1; Rm Report [phone]. D/c packet left at front desk for nursing.   Cecile Sheerer, MSW, LCSWA Office: [phone] Cell: [phone] Fax: [phone]

## 2022-03-08 NOTE — Progress Notes (Signed)
Inpatient Rehabilitation Discharge Medication Review by a Pharmacist  A complete drug regimen review was completed for this patient to identify any potential clinically significant medication issues.  High Risk Drug Classes Is patient taking? Indication by Medication  Antipsychotic Yes Seroquel- agitation  Anticoagulant Yes Lovenox- VTE prophylaxis  Antibiotic No   Opioid No   Antiplatelet Yes ASA- antiplatelet  Hypoglycemics/insulin No   Vasoactive Medication Yes Lopressor- HTN  Chemotherapy No   Other Yes Flomax- urinary retention Megestrol- appetite  Ritalin- attention Lomotil- diarrhea Miralax- constipation     Type of Medication Issue Identified Description of Issue Recommendation(s)  Drug Interaction(s) (clinically significant)     Duplicate Therapy     Allergy     No Medication Administration End Date     Incorrect Dose     Additional Drug Therapy Needed     Significant med changes from prior encounter (inform family/care partners about these prior to discharge).    Other       Clinically significant medication issues were identified that warrant physician communication and completion of prescribed/recommended actions by midnight of the next day:  No  Time spent performing this drug regimen review (minutes):  15  Loura Back, PharmD, BCPS Clinical Pharmacist Clinical phone for 03/08/2022 until 3p is [phone] 03/08/2022 7:15 AM

## 2022-03-08 NOTE — Progress Notes (Signed)
PROGRESS NOTE   Subjective/Complaints:  No new issues overnight. Seems to have rested well. Comfortable this morning  ROS: Limited due to cognitive/behavioral   Objective:   No results found.  No results for input(s): "WBC", "HGB", "HCT", "PLT" in the last 72 hours.    Recent Labs    03/08/22 0539  NA 135  K 3.5  CL 109  CO2 19*  GLUCOSE 95  BUN 26*  CREATININE 1.00  CALCIUM 8.1*       Intake/Output Summary (Last 24 hours) at 03/08/2022 0900 Last data filed at 03/08/2022 [id number] Gross per 24 hour  Intake 300 ml  Output 1150 ml  Net -850 ml      Pressure Injury 01/13/22 Coccyx Mid Unstageable - Full thickness tissue loss in which the base of the injury is covered by slough (yellow, tan, gray, green or brown) and/or eschar (tan, brown or black) in the wound bed. skin is broken with redness at the (Active)  01/13/22 1930  Location: Coccyx  Location Orientation: Mid  Staging: Unstageable - Full thickness tissue loss in which the base of the injury is covered by slough (yellow, tan, gray, green or brown) and/or eschar (tan, brown or black) in the wound bed.  Wound Description (Comments): skin is broken with redness at the base  Present on Admission: Yes (present on admission to rehab 9/27)    Physical Exam: Vital Signs Blood pressure 133/70, pulse 75, temperature 98.3 F (36.8 C), temperature source Oral, resp. rate 14, height 5\' 11"  (1.803 m), weight 56 kg, SpO2 100 %.   Constitutional: No distress . Vital signs reviewed. HEENT: NCAT, EOMI, oral membranes moist Neck: supple Cardiovascular: RRR without murmur. No JVD    Respiratory/Chest: CTA Bilaterally without wheezes or rales. Normal effort    GI/Abdomen: BS +, non-tender, non-distended Ext: no clubbing, cyanosis, or edema Psych: pleasant and cooperative  Skin: ext wounds healing/healed.  Small coccyx wound remains  Neuro: pleasantly confused,  cooperative.. Oriented to person only today. Follows basic commands  Moves all 4 limbs spontaneously but difficut to grade.    Musculoskeletal: no focal jt pain, nl prom. Right arm moving more freely although he has limitations with ROM at shoulder    Assessment/Plan: 1. Functional deficits which require 3+ hours per day of interdisciplinary therapy in a comprehensive inpatient rehab setting. Physiatrist is providing close team supervision and 24 hour management of active medical problems listed below. Physiatrist and rehab team continue to assess barriers to discharge/monitor patient progress toward functional and medical goals  Care Tool:  Bathing    Body parts bathed by patient: Right arm, Left arm, Chest, Abdomen, Front perineal area, Face   Body parts bathed by helper: Right upper leg, Left upper leg, Right lower leg, Left lower leg, Buttocks     Bathing assist Assist Level: Minimal Assistance - Patient > 75%     Upper Body Dressing/Undressing Upper body dressing   What is the patient wearing?: Pull over shirt    Upper body assist Assist Level: Minimal Assistance - Patient > 75%    Lower Body Dressing/Undressing Lower body dressing      What is the patient wearing?: Incontinence  brief, Pants     Lower body assist Assist for lower body dressing: Minimal Assistance - Patient > 75%     Toileting Toileting    Toileting assist Assist for toileting: Total Assistance - Patient < 25%     Transfers Chair/bed transfer  Transfers assist     Chair/bed transfer assist level: Minimal Assistance - Patient > 75%     Locomotion Ambulation   Ambulation assist   Ambulation activity did not occur: Safety/medical concerns  Assist level: Minimal Assistance - Patient > 75% Assistive device: Hand held assist Max distance: 250 ft   Walk 10 feet activity   Assist  Walk 10 feet activity did not occur: Safety/medical concerns  Assist level: Minimal Assistance - Patient  > 75% Assistive device: Hand held assist   Walk 50 feet activity   Assist Walk 50 feet with 2 turns activity did not occur: Safety/medical concerns  Assist level: Minimal Assistance - Patient > 75% Assistive device: Hand held assist    Walk 150 feet activity   Assist Walk 150 feet activity did not occur: Safety/medical concerns  Assist level: Minimal Assistance - Patient > 75% Assistive device: Hand held assist    Walk 10 feet on uneven surface  activity   Assist Walk 10 feet on uneven surfaces activity did not occur: Safety/medical concerns         Wheelchair     Assist Is the patient using a wheelchair?: Yes Type of Wheelchair: Manual    Wheelchair assist level: Dependent - Patient 0%      Wheelchair 50 feet with 2 turns activity    Assist    Wheelchair 50 feet with 2 turns activity did not occur: Safety/medical concerns   Assist Level: Dependent - Patient 0%   Wheelchair 150 feet activity     Assist  Wheelchair 150 feet activity did not occur: Safety/medical concerns   Assist Level: Dependent - Patient 0%   Blood pressure 133/70, pulse 75, temperature 98.3 F (36.8 C), temperature source Oral, resp. rate 14, height 5\' 11"  (1.803 m), weight 56 kg, SpO2 100 %.  Medical Problem List and Plan: 1. 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  Antithrombotics: -DVT/anticoagulation:  Pharmaceutical: Lovenox 30 mg BID             -antiplatelet therapy: aspirin 81 mg daily 3. Pain Management: continue Tylenol per tube scheduled,\; oxycodone, Robaxin prn  -pain improved 4. 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 Neuropsych/cognition: This patient is not capable of making decisions on his own behalf.               6. Skin/Wound Care: routine skin care checks             -monitor scalp lac/skin abrasions             -appreciate WOC recs for unstageable sacral/coccyx pressure wound   -continue medihoney/foam dressing 7. Fluids/Electrolytes/Nutrition/dysphagia: Strict Is and Os and follow-up chemistries             -  11/14 labs holding today. Still needs IVF at HS. Solid intake is reasonable although can be inconsistent 8: Right distal clavicle and humerus fracture:    -10/25 Xrays reviewed by ortho, continue sling and NWB RUE recommended.  F/u Dr. Carola Frost 1-2 weeks 9: Right 1 st rib fracture: pneumothorax resolved; IS/FV 10: C5-C6, C4 and C7 transverse process fractures:  -collar removed 11: Possible right VA and ICA injuries: continue daily aspirin 13: Urinary retention: has indwelling Foley catheter             -cardura stopped d/t hypotension.   -retrial of flomax without drop in BP  -appreciate urology help in replacing foley  -voiding trial after discharge in Urology office or perhaps at SNF 15: SAH:  completed 7 days of Keppra; follow-up with neurosurgery 16: Volume overload/peripheral edema: improved 18: Elevated transaminases: trending downward  -AST 28 and ALT 34 10/6, improved 19:  ?aspiration pneumonia w/x hx of staph and pseudomonas - vancomycin and cefepime total of 7 more days ended 10/5    20: SVT/sinus tach intermittently: monitor 21: Mediastinal hematoma: Echo WNL; Hgb stable 22: Ankle edema and ecchymosis: x-rays negative for osseous abnormality           Continue TEDS, elevation in bed 23. Loose stools improved.     - LBM 11/12 formed 24. Normocytic anemia/ABLA: likely related to hospitalization and chronic disease  -no gross blood loss  continue b complex and iron    Latest Ref Rng & Units 03/04/2022    4:17 AM 03/02/2022    5:59 AM 02/21/2022    6:00 AM   CBC  WBC 4.0 - 10.5 K/uL 6.9  7.4  8.4   Hemoglobin 13.0 - 17.0 g/dL 9.3  70.0  17.4   Hematocrit 39.0 - 52.0 % 29.5  32.1  33.0   Platelets 150 - 400 K/uL 204  264  268     25. Hypoglycemia:     -he's not diabetic. Dc'ed cbg checks.  26. Hypokalemia  -10/30 Stable at K+ 3.8 27. HTN  - metoprolol 25mg  BID Vitals:   03/07/22 2006 03/07/22 2008  BP: 133/70 133/70  Pulse: 75 75  Resp:  14  Temp:  98.3 F (36.8 C)  SpO2:  100%       LOS: 48 days A FACE TO FACE EVALUATION WAS PERFORMED  03/09/22 03/08/2022, 9:00 AM

## 2022-03-08 NOTE — Progress Notes (Signed)
Inpatient Rehabilitation Care Coordinator Discharge Note   Patient Details  Name: Dorion Petillo MRN: [id number] Date of Birth: 05-07-1940   Discharge location: D/c to SNF- [facility]  Length of Stay: 48 days  Discharge activity level: Min Asst  Home/community participation: Limited  Patient response MK:LKJZPH Literacy - How often do you need to have someone help you when you read instructions, pamphlets, or other written material from your doctor or pharmacy?: Never  Patient response XT:AVWPVX Isolation - How often do you feel lonely or isolated from those around you?: Patient unable to respond  Services provided included: MD, RD, OT, PT, SLP, RN, CM, TR, Pharmacy, Neuropsych, SW  Financial Services:  Field seismologist Utilized: Other (Comment) (Medicare Part A only)    Choices offered to/list presented to: Yes  Follow-up services arranged:              Patient response to transportation need: Is the patient able to respond to transportation needs?: Yes In the past 12 months, has lack of transportation kept you from medical appointments or from getting medications?: No In the past 12 months, has lack of transportation kept you from meetings, work, or from getting things needed for daily living?: No    Comments (or additional information):  Patient/Family verbalized understanding of follow-up arrangements:  Yes  Individual responsible for coordination of the follow-up plan: contact pt son Shad  Confirmed correct DME delivered: Gretchen Short 03/08/2022    Gretchen Short

## 2022-03-08 NOTE — Progress Notes (Signed)
Report called to [facility] at this time.

## 2022-03-08 NOTE — Progress Notes (Signed)
Physical Therapy Discharge Summary  Patient Details  Name: Dominic Smith MRN: [id number] Date of Birth: 1940-10-12  Date of Discharge from PT service:March 17, 2022  Today's Date: 03/08/2022   Patient has met 6 of 6 long term goals due to improved activity tolerance, improved balance, improved postural control, increased strength, increased range of motion, decreased pain, ability to compensate for deficits, and improved coordination.  Patient to discharge at an ambulatory level [place].   Patient's care partner unavailable to provide the necessary physical and cognitive assistance at discharge.Plan for d/c to SNF for continued low intensity therapeutic interventions for improved patient independence and awareness with functional mobility prior to d/c home.   Reasons goals not met: All PT goals met based on POC at this time.   Recommendation:  Patient will benefit from ongoing skilled PT services in skilled nursing facility setting to continue to advance safe functional mobility, address ongoing impairments in balance, activity tolerance, functional mobility, gait and stair training, safety awareness, spatial orientation, patient/caregiver education, and minimize fall risk.  Equipment: Equipment to be provided at next level of care.  Reasons for discharge: treatment goals met and discharge from hospital  Patient/family agrees with progress made and goals achieved: Yes  PT Discharge Precautions/Restrictions Precautions Precautions: Fall Required Braces or Orthoses: Sling Restrictions Weight Bearing Restrictions: Yes RUE Weight Bearing: Non weight bearing Pain Interference Pain Interference Pain Effect on Sleep: 1. Rarely or not at all Pain Interference with Therapy Activities: 1. Rarely or not at all Pain Interference with Day-to-Day Activities: 1. Rarely or not at all Vision/Perception  Vision - History Ability to See in Adequate Light: 1 Impaired Vision -  Assessment Eye Alignment: Within Functional Limits Tracking/Visual Pursuits: Unable to hold eye position out of midline;Requires cues, head turns, or add eye shifts to track Additional Comments: cognitive deficits impact visual assessment, pt with difficulty following direction. Vision assessed during functional tasks Perception Perception: Impaired Inattention/Neglect: Does not attend to left visual field;Does not attend to left side of body Praxis Praxis: Impaired Praxis Impairment Details: Initiation;Motor planning;Perseveration  Cognition Overall Cognitive Status: Impaired/Different from baseline Arousal/Alertness: Awake/alert Orientation Level: Oriented to person;Disoriented to place;Disoriented to time;Disoriented to situation Attention: Sustained Focused Attention: Appears intact Sustained Attention: Impaired Sustained Attention Impairment: Verbal basic;Functional basic Memory: Impaired Memory Impairment: Storage deficit;Decreased recall of new information;Decreased short term memory Awareness: Impaired Awareness Impairment: Intellectual impairment Problem Solving: Impaired Problem Solving Impairment: Verbal basic;Functional basic Executive Function:  (all impaired due to lower level deficits) Behaviors: Perseveration;Lability Safety/Judgment: Impaired [employer] Scales of Cognitive Functioning: Confused, Inappropriate Non-Agitated Sensation Sensation Light Touch: Appears Intact Hot/Cold: Appears Intact Proprioception: Impaired by gross assessment Coordination Gross Motor Movements are Fluid and Coordinated: No Fine Motor Movements are Fluid and Coordinated: No Heel Shin Test: pt unable to follow cues for testing due to cognitive deficits Motor  Motor Motor: Motor apraxia;Abnormal postural alignment and control  Mobility Bed Mobility Rolling Right: Minimal Assistance - Patient > 75% Rolling Left: Minimal Assistance - Patient > 75% Supine to Sit: Minimal  Assistance - Patient > 75% Sit to Supine: Minimal Assistance - Patient > 75% Transfers Transfers: Sit to Stand;Stand to Sit;Stand Pivot Transfers Sit to Stand: Contact Guard/Touching assist Stand to Sit: Contact Guard/Touching assist Stand Pivot Transfers: Contact Guard/Touching assist;Minimal Assistance - Patient > 75% Stand Pivot Transfer Details: Manual facilitation for weight shifting Transfer (Assistive device): None Locomotion  Gait Ambulation: Yes Gait Assistance: Minimal Assistance - Patient > 75% Gait Distance (Feet): 200 Feet Assistive  device: 1 person hand held assist Gait Assistance Details: Manual facilitation for weight shifting;Verbal cues for gait pattern;Verbal cues for technique;Verbal cues for precautions/safety Gait Gait: Yes Gait Pattern: Step-through pattern;Decreased stride length;Decreased hip/knee flexion - right;Decreased hip/knee flexion - left;Right foot flat;Left foot flat;Shuffle;Lateral trunk lean to right;Decreased trunk rotation;Trunk flexed;Narrow base of support Gait velocity: significantly decreased Stairs / Additional Locomotion Stairs: Yes Stairs Assistance: Minimal Assistance - Patient > 75%;Moderate Assistance - Patient 50 - 74% Stair Management Technique: One rail Left Number of Stairs: 4 Height of Stairs: 6 Wheelchair Mobility Wheelchair Mobility: Yes Wheelchair Assistance: Dependent - Patient 0% Wheelchair Propulsion: Other (comment) (dependent in TIS w/c) Wheelchair Parts Management: Needs assistance  Trunk/Postural Assessment  Cervical Assessment Cervical Assessment: Exceptions to [facility] (forward head) Thoracic Assessment Thoracic Assessment: Exceptions to [facility] (rounded shoulders) Lumbar Assessment Lumbar Assessment: Exceptions to [facility] (posterior pelvic tilt) Postural Control Postural Control: Deficits on evaluation (delayed and insufficient)  Balance Standardized Balance Assessment Standardized Balance Assessment: Berg Balance  Test Berg Balance Test Sit to Stand: Able to stand using hands after several tries Standing Unsupported: Able to stand 30 seconds unsupported Sitting with Back Unsupported but Feet Supported on Floor or Stool: Able to sit 2 minutes under supervision Stand to Sit: Sits independently, has uncontrolled descent Transfers: Needs one person to assist Standing Unsupported with Eyes Closed: Able to stand 3 seconds Standing Ubsupported with Feet Together: Needs help to attain position and unable to hold for 15 seconds From Standing, Reach Forward with Outstretched Arm: Loses balance while trying/requires external support From Standing Position, Pick up Object from Floor: Unable to try/needs assist to keep balance From Standing Position, Turn to Look Behind Over each Shoulder: Needs assist to keep from losing balance and falling Turn 360 Degrees: Needs assistance while turning Standing Unsupported, Alternately Place Feet on Step/Stool: Needs assistance to keep from falling or unable to try Standing Unsupported, One Foot in Front: Loses balance while stepping or standing Standing on One Leg: Unable to try or needs assist to prevent fall Total Score: 11 Static Sitting Balance Static Sitting - Balance Support: Feet supported Static Sitting - Level of Assistance: 5: Stand by assistance Dynamic Sitting Balance Dynamic Sitting - Balance Support: During functional activity;Feet unsupported Dynamic Sitting - Level of Assistance: 4: Min assist;5: Stand by assistance Static Standing Balance Static Standing - Balance Support: During functional activity Static Standing - Level of Assistance: 5: Stand by assistance;4: Min assist Dynamic Standing Balance Dynamic Standing - Balance Support: During functional activity;Left upper extremity supported Dynamic Standing - Level of Assistance: 4: Min assist Extremity Assessment  RLE Assessment RLE Assessment: Exceptions to [facility] General Strength Comments: Difficult to  test d/t poor command following. Grossly at least 3+/5 with functional mobility. LLE Assessment LLE Assessment: Exceptions to [facility] General Strength Comments: Difficult to test d/t poor command following. Grossly at least 3+/5 with functional mobility.   Shylyn Younce L Lazaria Schaben PT, DPT, NCS, CBIS  03/08/2022, 10:04 PM

## 2022-03-08 NOTE — Plan of Care (Signed)
  Problem: RH Swallowing Goal: LTG Patient will consume least restrictive diet using compensatory strategies with assistance (SLP) Description: LTG:  Patient will consume least restrictive diet using compensatory strategies with assistance (SLP) Outcome: Completed/Met Goal: LTG Patient will participate in dysphagia therapy to increase swallow function with assistance (SLP) Description: LTG:  Patient will participate in dysphagia therapy to increase swallow function with assistance (SLP) Outcome: Completed/Met Goal: LTG Pt will demonstrate functional change in swallow as evidenced by bedside/clinical objective assessment (SLP) Description: LTG: Patient will demonstrate functional change in swallow as evidenced by bedside/clinical objective assessment (SLP) Outcome: Completed/Met   Problem: RH Cognition - SLP Goal: RH LTG Patient will demonstrate orientation with cues Description:  LTG:  Patient will demonstrate orientation to person/place/time/situation with cues (SLP)   Outcome: Completed/Met   Problem: RH Comprehension Communication Goal: LTG Patient will comprehend basic/complex auditory (SLP) Description: LTG: Patient will comprehend basic/complex auditory information with cues (SLP). Outcome: Completed/Met   Problem: RH Expression Communication Goal: LTG Patient will verbally express basic/complex needs(SLP) Description: LTG:  Patient will verbally express basic/complex needs, wants or ideas with cues  (SLP) Outcome: Completed/Met   Problem: RH Attention Goal: LTG Patient will demonstrate this level of attention during functional activites (SLP) Description: LTG:  Patient will will demonstrate this level of attention during functional activites (SLP) Outcome: Completed/Met

## 2022-03-21 ENCOUNTER — Encounter: Payer: Self-pay | Admitting: Registered Nurse

## 2022-03-24 ENCOUNTER — Encounter (HOSPITAL_COMMUNITY): Payer: Self-pay

## 2022-03-24 ENCOUNTER — Other Ambulatory Visit: Payer: Self-pay

## 2022-03-24 ENCOUNTER — Emergency Department (HOSPITAL_COMMUNITY)
Admission: EM | Admit: 2022-03-24 | Discharge: 2022-03-24 | Disposition: A | Discharge status: Home / Self Care | Payer: Self-pay | Attending: Emergency Medicine | Admitting: Emergency Medicine

## 2022-03-24 ENCOUNTER — Emergency Department (HOSPITAL_COMMUNITY): Payer: Self-pay

## 2022-03-24 DIAGNOSIS — Z7901 Long term (current) use of anticoagulants: Secondary | ICD-10-CM | POA: Diagnosis not present

## 2022-03-24 DIAGNOSIS — E860 Dehydration: Secondary | ICD-10-CM | POA: Insufficient documentation

## 2022-03-24 DIAGNOSIS — W19XXXA Unspecified fall, initial encounter: Secondary | ICD-10-CM | POA: Insufficient documentation

## 2022-03-24 DIAGNOSIS — Z7982 Long term (current) use of aspirin: Secondary | ICD-10-CM | POA: Diagnosis not present

## 2022-03-24 DIAGNOSIS — Z049 Encounter for examination and observation for unspecified reason: Secondary | ICD-10-CM | POA: Diagnosis present

## 2022-03-24 HISTORY — DX: Nonrheumatic aortic (valve) stenosis: I35.0

## 2022-03-24 HISTORY — DX: Cachexia: R64

## 2022-03-24 HISTORY — DX: Dehydration: E86.0

## 2022-03-24 HISTORY — DX: Retention of urine, unspecified: R33.9

## 2022-03-24 LAB — CBC WITH DIFFERENTIAL/PLATELET
Abs Immature Granulocytes: 0.04 10*3/uL (ref 0.00–0.07)
Basophils Absolute: 0 10*3/uL (ref 0.0–0.1)
Basophils Relative: 0 %
Eosinophils Absolute: 0.1 10*3/uL (ref 0.0–0.5)
Eosinophils Relative: 1 %
HCT: 39.4 % (ref 39.0–52.0)
Hemoglobin: 12.6 g/dL — ABNORMAL LOW (ref 13.0–17.0)
Immature Granulocytes: 0 %
Lymphocytes Relative: 9 %
Lymphs Abs: 0.9 10*3/uL (ref 0.7–4.0)
MCH: 28.3 pg (ref 26.0–34.0)
MCHC: 32 g/dL (ref 30.0–36.0)
MCV: 88.3 fL (ref 80.0–100.0)
Monocytes Absolute: 0.9 10*3/uL (ref 0.1–1.0)
Monocytes Relative: 10 %
Neutro Abs: 7.7 10*3/uL (ref 1.7–7.7)
Neutrophils Relative %: 80 %
Platelets: 272 10*3/uL (ref 150–400)
RBC: 4.46 MIL/uL (ref 4.22–5.81)
RDW: 15.9 % — ABNORMAL HIGH (ref 11.5–15.5)
WBC: 9.7 10*3/uL (ref 4.0–10.5)
nRBC: 0 % (ref 0.0–0.2)

## 2022-03-24 LAB — BASIC METABOLIC PANEL
Anion gap: 9 (ref 5–15)
BUN: 44 mg/dL — ABNORMAL HIGH (ref 8–23)
CO2: 22 mmol/L (ref 22–32)
Calcium: 9.2 mg/dL (ref 8.9–10.3)
Chloride: 117 mmol/L — ABNORMAL HIGH (ref 98–111)
Creatinine, Ser: 1.81 mg/dL — ABNORMAL HIGH (ref 0.61–1.24)
GFR, Estimated: 37 mL/min — ABNORMAL LOW (ref >60)
Glucose, Bld: 128 mg/dL — ABNORMAL HIGH (ref 70–99)
Potassium: 3.3 mmol/L — ABNORMAL LOW (ref 3.5–5.1)
Sodium: 148 mmol/L — ABNORMAL HIGH (ref 135–145)

## 2022-03-24 MED ORDER — SODIUM CHLORIDE 0.9 % IV BOLUS
1000.0000 mL | Freq: Once | INTRAVENOUS | Status: AC
  Administered 2022-03-24: 1000 mL via INTRAVENOUS

## 2022-03-24 NOTE — Discharge Instructions (Addendum)
Drink plenty of fluids.  Follow-up with your family doctor to schedule an MRI as an outpatient.  Take Tylenol for pain

## 2022-03-24 NOTE — ED Triage Notes (Signed)
"  Facility states patient was found laying in the floor in another patient's room. They did not witness him fall and no obvious injuries, but they think he likely did fall. They said he has been wandering around in other people's rooms more lately than usual" per EMS

## 2022-03-24 NOTE — ED Provider Notes (Signed)
[facility] EMERGENCY DEPARTMENT Provider Note   CSN: [id number] Arrival date & time: 03/24/22  1310     History  Chief Complaint  Patient presents with   Dominic Smith is a 81 y.o. male.  Patient is in a nursing home.  He has a history of TBI.  He has been falling recently.  The history is provided by the patient and the nursing home. No language interpreter was used.  Fall This is a recurrent problem. The problem occurs rarely. The problem has been resolved. Pertinent negatives include no chest pain, no abdominal pain and no headaches. Nothing aggravates the symptoms. Nothing relieves the symptoms. He has tried nothing for the symptoms. The treatment provided no relief.       Home Medications Prior to Admission medications   Medication Sig Start Date End Date Taking? Authorizing Provider  acetaminophen (TYLENOL) 325 MG tablet Place 1-2 tablets (325-650 mg total) into feeding tube every 4 (four) hours as needed for mild pain. 03/08/22  Yes Setzer, Edman Circle, PA-C  alum & mag hydroxide-simeth (MAALOX/MYLANTA) 200-200-20 MG/5ML suspension Take 30 mLs by mouth every 4 (four) hours as needed for indigestion. 03/08/22  Yes Setzer, Edman Circle, PA-C  apixaban (ELIQUIS) 2.5 MG TABS tablet Take 2.5 mg by mouth 2 (two) times daily.   Yes [provider]  aspirin 81 MG chewable tablet Chew 1 tablet (81 mg total) by mouth daily. 03/09/22  Yes Setzer, Edman Circle, PA-C  B Complex-C (B-COMPLEX WITH VITAMIN C) tablet Take 1 tablet by mouth daily. 03/09/22  Yes Setzer, Edman Circle, PA-C  Chlorhexidine Gluconate Cloth 2 % PADS Apply 6 each topically daily at 6 (six) AM. 03/08/22  Yes Setzer, Edman Circle, PA-C  diphenoxylate-atropine (LOMOTIL) 2.5-0.025 MG tablet Place 1 tablet into feeding tube 4 (four) times daily as needed for diarrhea or loose stools. 03/08/22  Yes Setzer, Edman Circle, PA-C  feeding supplement (ENSURE ENLIVE / ENSURE PLUS) LIQD Take 237 mLs by mouth 3 (three) times daily  between meals. 03/08/22  Yes Setzer, Edman Circle, PA-C  guaiFENesin (ROBITUSSIN) 100 MG/5ML liquid Take 10 mLs by mouth every 6 (six) hours as needed for cough or to loosen phlegm. 03/08/22  Yes Setzer, Edman Circle, PA-C  leptospermum manuka honey (MEDIHONEY) PSTE paste Apply 1 Application topically daily. 03/09/22  Yes Setzer, Edman Circle, PA-C  megestrol (MEGACE) 400 MG/10ML suspension Take 10 mLs (400 mg total) by mouth 2 (two) times daily. 03/08/22  Yes Setzer, Edman Circle, PA-C  methylphenidate (RITALIN) 5 MG tablet Take 3 tablets (15 mg total) by mouth daily. 03/09/22  Yes Setzer, Edman Circle, PA-C  metoprolol tartrate (LOPRESSOR) 25 MG tablet Take 1 tablet (25 mg total) by mouth 2 (two) times daily. 03/08/22  Yes Setzer, Edman Circle, PA-C  Mouthwashes (MOUTH RINSE) LIQD solution 15 mLs by Mouth Rinse route as needed (oral care). 03/08/22  Yes Setzer, Edman Circle, PA-C  Nystatin (GERHARDT'S BUTT CREAM) CREA Apply 1 Application topically 2 (two) times daily. 03/08/22  Yes Setzer, Edman Circle, PA-C  polyethylene glycol (MIRALAX / GLYCOLAX) 17 g packet Place 17 g into feeding tube daily. 03/09/22  Yes Setzer, Edman Circle, PA-C  QUEtiapine (SEROQUEL) 50 MG tablet Take 1 tablet (50 mg total) by mouth every 8 (eight) hours as needed (agitation). 03/08/22  Yes Setzer, Edman Circle, PA-C  sodium chloride 0.45 % solution Inject 75 mLs into the vein continuous. Infuse 75cc/hr daily for 12 hours starting at 1900 hours. 03/08/22  Yes Setzer,  Edman Circle, PA-C  sodium chloride 0.9 % infusion Inject 10 mLs into the vein as needed (for administration of IV medications (carrier fluid)). 03/08/22  Yes Setzer, Edman Circle, PA-C  tamsulosin (FLOMAX) 0.4 MG CAPS capsule Take 1 capsule (0.4 mg total) by mouth daily after supper. 03/08/22  Yes Setzer, Edman Circle, PA-C  enoxaparin (LOVENOX) 30 MG/0.3ML injection Inject 0.3 mLs (30 mg total) into the skin every 12 (twelve) hours. Patient not taking: Reported on 03/24/2022 03/08/22   Barbie Banner, PA-C       Allergies    Patient has no known allergies.    Review of Systems   Review of Systems  Constitutional:  Negative for appetite change and fatigue.  HENT:  Negative for congestion, ear discharge and sinus pressure.   Eyes:  Negative for discharge.  Respiratory:  Negative for cough.   Cardiovascular:  Negative for chest pain.  Gastrointestinal:  Negative for abdominal pain and diarrhea.  Genitourinary:  Negative for frequency and hematuria.  Musculoskeletal:  Negative for back pain.  Skin:  Negative for rash.  Neurological:  Negative for seizures and headaches.  Psychiatric/Behavioral:  Negative for hallucinations.     Physical Exam Updated Vital Signs BP (!) 178/84   Pulse 63   Temp 98 F (36.7 C) (Oral)   Resp 15   Ht 5\' 11"  (1.803 m)   Wt 54.4 kg   SpO2 100%   BMI 16.74 kg/m  Physical Exam Vitals and nursing note reviewed.  Constitutional:      Appearance: He is well-developed.  HENT:     Head: Normocephalic.     Comments: Mucous membranes mildly dry    Nose: Nose normal.  Eyes:     General: No scleral icterus.    Conjunctiva/sclera: Conjunctivae normal.  Neck:     Thyroid: No thyromegaly.  Cardiovascular:     Rate and Rhythm: Normal rate and regular rhythm.     Heart sounds: No murmur heard.    No friction rub. No gallop.  Pulmonary:     Breath sounds: No stridor. No wheezing or rales.  Chest:     Chest wall: No tenderness.  Abdominal:     General: There is no distension.     Tenderness: There is no abdominal tenderness. There is no rebound.  Musculoskeletal:        General: Normal range of motion.     Cervical back: Neck supple.  Lymphadenopathy:     Cervical: No cervical adenopathy.  Skin:    Findings: No erythema or rash.  Neurological:     Mental Status: He is alert and oriented to person, place, and time.     Motor: No abnormal muscle tone.     Coordination: Coordination normal.  Psychiatric:        Behavior: Behavior normal.     ED  Results / Procedures / Treatments   Labs (all labs ordered are listed, but only abnormal results are displayed) Labs Reviewed  CBC WITH DIFFERENTIAL/PLATELET - Abnormal; Notable for the following components:      Result Value   Hemoglobin 12.6 (*)    RDW 15.9 (*)    All other components within normal limits  BASIC METABOLIC PANEL - Abnormal; Notable for the following components:   Sodium 148 (*)    Potassium 3.3 (*)    Chloride 117 (*)    Glucose, Bld 128 (*)    BUN 44 (*)    Creatinine, Ser 1.81 (*)    GFR,  Estimated 37 (*)    All other components within normal limits    EKG None  Radiology DG Chest Port 1 View  Result Date: 03/24/2022 CLINICAL DATA:  fall EXAM: PORTABLE CHEST - 1 VIEW COMPARISON:  02/09/2022 FINDINGS: Cardiac silhouette is unremarkable. No pneumothorax or pleural effusion. The lungs are clear. Aorta is calcified. There are thoracic degenerative changes. IMPRESSION: No acute cardiopulmonary process. Electronically Signed   By: Sammie Bench M.D.   On: 03/24/2022 14:26   DG Pelvis Portable  Result Date: 03/24/2022 CLINICAL DATA:  Fall EXAM: PORTABLE PELVIS 1-2 VIEWS COMPARISON:  None Available. FINDINGS: There is no evidence of pelvic fracture or diastasis. No pelvic bone lesions are seen. IMPRESSION: Negative. Electronically Signed   By: Fidela Salisbury M.D.   On: 03/24/2022 14:26   CT Head Wo Contrast  Result Date: 03/24/2022 CLINICAL DATA:  Trauma EXAM: CT HEAD WITHOUT CONTRAST CT CERVICAL SPINE WITHOUT CONTRAST TECHNIQUE: Multidetector CT imaging of the head and cervical spine was performed following the standard protocol without intravenous contrast. Multiplanar CT image reconstructions of the cervical spine were also generated. RADIATION DOSE REDUCTION: This exam was performed according to the departmental dose-optimization program which includes automated exposure control, adjustment of the mA and/or kV according to patient size and/or use of iterative  reconstruction technique. COMPARISON:  CT head 02/09/22 FINDINGS: CT HEAD FINDINGS Brain: No evidence of acute infarction, hemorrhage, hydrocephalus, extra-axial collection. The sella appears expanded with a masslike soft tissue density in the right aspect of the sella (series 5, image 28). Vascular: No hyperdense vessel or unexpected calcification. Skull: Normal. Negative for fracture or focal lesion. 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 Similar findings are seen in the left sphenoid and left frontal sinus Other: None. CT CERVICAL SPINE FINDINGS Alignment: Straightening of the normal cervical lordosis. Grade 1 anterolisthesis of C4 on C5. Skull base and vertebrae: No acute fracture. No primary bone lesion or focal pathologic process. Soft tissues and spinal canal: No prevertebral fluid or swelling. No visible canal hematoma. Disc levels: There is a degenerative pannus at C1-C2 that results in mild spinal canal narrowing. There is also mild-to-moderate spinal canal narrowing at C2-C3 secondary to a disc bulge and ligamentum flavum hypertrophy. And multilevel neural foraminal stenosis, severe at C3-C4 on the right Upper chest: Biapical pleuroparenchymal scarring. Other: None IMPRESSION: CT HEAD: 1. No acute intracranial abnormality. 2. 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 Recommend further evaluation with MRI of the brain with and without contrast, if not previously performed. 3. Chronic left maxillary, left sphenoid, and left frontal sinusitis. CT CERVICAL SPINE: No acute fracture or traumatic listhesis. Electronically Signed   By: Marin Roberts M.D.   On: 03/24/2022 14:22   CT Cervical Spine Wo Contrast  Result Date: 03/24/2022 CLINICAL DATA:  Trauma EXAM: CT HEAD WITHOUT CONTRAST CT CERVICAL SPINE WITHOUT CONTRAST  TECHNIQUE: Multidetector CT imaging of the head and cervical spine was performed following the standard protocol without intravenous contrast. Multiplanar CT image reconstructions of the cervical spine were also generated. RADIATION DOSE REDUCTION: This exam was performed according to the departmental dose-optimization program which includes automated exposure control, adjustment of the mA and/or kV according to patient size and/or use of iterative reconstruction technique. COMPARISON:  CT head 02/09/22 FINDINGS: CT HEAD FINDINGS Brain: No evidence of acute infarction, hemorrhage, hydrocephalus, extra-axial collection. The  sella appears expanded with a masslike soft tissue density in the right aspect of the sella (series 5, image 28). Vascular: No hyperdense vessel or unexpected calcification. Skull: Normal. Negative for fracture or focal lesion. 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 Similar findings are seen in the left sphenoid and left frontal sinus Other: None. CT CERVICAL SPINE FINDINGS Alignment: Straightening of the normal cervical lordosis. Grade 1 anterolisthesis of C4 on C5. Skull base and vertebrae: No acute fracture. No primary bone lesion or focal pathologic process. Soft tissues and spinal canal: No prevertebral fluid or swelling. No visible canal hematoma. Disc levels: There is a degenerative pannus at C1-C2 that results in mild spinal canal narrowing. There is also mild-to-moderate spinal canal narrowing at C2-C3 secondary to a disc bulge and ligamentum flavum hypertrophy. And multilevel neural foraminal stenosis, severe at C3-C4 on the right Upper chest: Biapical pleuroparenchymal scarring. Other: None IMPRESSION: CT HEAD: 1. No acute intracranial abnormality. 2. 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  Recommend further evaluation with MRI of the brain with and without contrast, if not previously performed. 3. Chronic left maxillary, left sphenoid, and left frontal sinusitis. CT CERVICAL SPINE: No acute fracture or traumatic listhesis. Electronically Signed   By: Marin Roberts M.D.   On: 03/24/2022 14:22    Procedures Procedures    Medications Ordered in ED Medications  sodium chloride 0.9 % bolus 1,000 mL (1,000 mLs Intravenous New Bag/Given 03/24/22 1454)    ED Course/ Medical Decision Making/ A&P                           Medical Decision Making Amount and/or Complexity of Data Reviewed Labs: ordered. Radiology: ordered.  This patient presents to the ED for concern of fall, this involves an extensive number of treatment options, and is a complaint that carries with it a high risk of complications and morbidity.  The differential diagnosis includes dehydration weakness   Co morbidities that complicate the patient evaluation  TBI   Additional history obtained:  Additional history obtained from person home External records from outside source obtained and reviewed including hospital records   Lab Tests:  I Ordered, and personally interpreted labs.  The pertinent results include: Sodium 148, BUN 44, creatinine 1.8   Imaging Studies ordered:  I ordered imaging studies including chest x-ray pelvis CT head showed no acute problems.  CT head shows possible pituitary mass I independently visualized and interpreted imaging which showed possible pituitary mass I agree with the radiologist interpretation   Cardiac Monitoring: / EKG:  The patient was maintained on a cardiac monitor.  I personally viewed and interpreted the cardiac monitored which showed an underlying rhythm of: Normal sinus rhythm   Consultations Obtained:  No consultant  Problem List / ED Course / Critical interventions / Medication management  Falls, TBI, possible pituitary mass I ordered medication  including saline for dehydration Reevaluation of the patient after these medicines showed that the patient improved I have reviewed the patients home medicines and have made adjustments as needed   Social Determinants of Health:  Lives in nursing home   Test / Admission - Considered:  MRI was considered but can be done as an outpatient  With no acute injuries from his fall.  He has mild dehydration and will be given  some fluids.  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  I recommend an MRI as an outpatient.        Final Clinical Impression(s) / ED Diagnoses Final diagnoses:  Fall, initial encounter  Dehydration    Rx / DC Orders ED Discharge Orders     None         Bethann Berkshire, MD 03/26/22 [phone]

## 2022-03-24 NOTE — ED Notes (Signed)
EMS arrived to pick up patient. Patient became combative while trying to unhook him from monitor. Able to place him on stretcher without incident.

## 2022-03-24 NOTE — ED Triage Notes (Signed)
"  Patient is on blood thinners per the facility" per EMS

## 2022-04-01 ENCOUNTER — Emergency Department (HOSPITAL_COMMUNITY): Payer: Medicare Other

## 2022-04-01 ENCOUNTER — Encounter (HOSPITAL_COMMUNITY): Payer: Self-pay | Admitting: Emergency Medicine

## 2022-04-01 ENCOUNTER — Inpatient Hospital Stay (HOSPITAL_COMMUNITY)
Admission: EM | Admit: 2022-04-01 | Discharge: 2022-04-05 | DRG: 871 | Disposition: A | Discharge status: Skilled Nursing Facility | Payer: Medicare Other | Source: Skilled Nursing Facility | Attending: Internal Medicine | Admitting: Internal Medicine

## 2022-04-01 ENCOUNTER — Other Ambulatory Visit: Payer: Self-pay

## 2022-04-01 DIAGNOSIS — E870 Hyperosmolality and hypernatremia: Secondary | ICD-10-CM | POA: Diagnosis present

## 2022-04-01 DIAGNOSIS — A4159 Other Gram-negative sepsis: Secondary | ICD-10-CM | POA: Diagnosis present

## 2022-04-01 DIAGNOSIS — Z7982 Long term (current) use of aspirin: Secondary | ICD-10-CM

## 2022-04-01 DIAGNOSIS — I129 Hypertensive chronic kidney disease with stage 1 through stage 4 chronic kidney disease, or unspecified chronic kidney disease: Secondary | ICD-10-CM | POA: Diagnosis present

## 2022-04-01 DIAGNOSIS — N3000 Acute cystitis without hematuria: Secondary | ICD-10-CM

## 2022-04-01 DIAGNOSIS — R4182 Altered mental status, unspecified: Principal | ICD-10-CM

## 2022-04-01 DIAGNOSIS — E876 Hypokalemia: Secondary | ICD-10-CM | POA: Diagnosis present

## 2022-04-01 DIAGNOSIS — E860 Dehydration: Secondary | ICD-10-CM | POA: Diagnosis present

## 2022-04-01 DIAGNOSIS — G9341 Metabolic encephalopathy: Secondary | ICD-10-CM | POA: Diagnosis present

## 2022-04-01 DIAGNOSIS — E871 Hypo-osmolality and hyponatremia: Secondary | ICD-10-CM | POA: Diagnosis present

## 2022-04-01 DIAGNOSIS — N1832 Chronic kidney disease, stage 3b: Secondary | ICD-10-CM | POA: Diagnosis present

## 2022-04-01 DIAGNOSIS — N182 Chronic kidney disease, stage 2 (mild): Secondary | ICD-10-CM | POA: Diagnosis present

## 2022-04-01 DIAGNOSIS — E43 Unspecified severe protein-calorie malnutrition: Secondary | ICD-10-CM | POA: Diagnosis present

## 2022-04-01 DIAGNOSIS — R6520 Severe sepsis without septic shock: Secondary | ICD-10-CM | POA: Diagnosis present

## 2022-04-01 DIAGNOSIS — Z79899 Other long term (current) drug therapy: Secondary | ICD-10-CM

## 2022-04-01 DIAGNOSIS — N390 Urinary tract infection, site not specified: Secondary | ICD-10-CM | POA: Diagnosis present

## 2022-04-01 DIAGNOSIS — B961 Klebsiella pneumoniae [K. pneumoniae] as the cause of diseases classified elsewhere: Secondary | ICD-10-CM | POA: Diagnosis present

## 2022-04-01 DIAGNOSIS — R54 Age-related physical debility: Secondary | ICD-10-CM | POA: Diagnosis present

## 2022-04-01 DIAGNOSIS — I350 Nonrheumatic aortic (valve) stenosis: Secondary | ICD-10-CM

## 2022-04-01 DIAGNOSIS — N179 Acute kidney failure, unspecified: Secondary | ICD-10-CM | POA: Diagnosis present

## 2022-04-01 DIAGNOSIS — Z87820 Personal history of traumatic brain injury: Secondary | ICD-10-CM

## 2022-04-01 DIAGNOSIS — A419 Sepsis, unspecified organism: Secondary | ICD-10-CM | POA: Diagnosis present

## 2022-04-01 DIAGNOSIS — Z681 Body mass index (BMI) 19 or less, adult: Secondary | ICD-10-CM

## 2022-04-01 LAB — COMPREHENSIVE METABOLIC PANEL
ALT: 55 U/L — ABNORMAL HIGH (ref 0–44)
AST: 47 U/L — ABNORMAL HIGH (ref 15–41)
Albumin: 3 g/dL — ABNORMAL LOW (ref 3.5–5.0)
Alkaline Phosphatase: 88 U/L (ref 38–126)
Anion gap: 9 (ref 5–15)
BUN: 77 mg/dL — ABNORMAL HIGH (ref 8–23)
CO2: 24 mmol/L (ref 22–32)
Calcium: 9.4 mg/dL (ref 8.9–10.3)
Chloride: 121 mmol/L — ABNORMAL HIGH (ref 98–111)
Creatinine, Ser: 2.69 mg/dL — ABNORMAL HIGH (ref 0.61–1.24)
GFR, Estimated: 23 mL/min — ABNORMAL LOW (ref >60)
Glucose, Bld: 121 mg/dL — ABNORMAL HIGH (ref 70–99)
Potassium: 3.4 mmol/L — ABNORMAL LOW (ref 3.5–5.1)
Sodium: 154 mmol/L — ABNORMAL HIGH (ref 135–145)
Total Bilirubin: 1.4 mg/dL — ABNORMAL HIGH (ref 0.3–1.2)
Total Protein: 7.6 g/dL (ref 6.5–8.1)

## 2022-04-01 LAB — URINALYSIS, MICROSCOPIC (REFLEX)
RBC / HPF: >50 RBC/hpf (ref 0–5)
Squamous Epithelial / HPF: NONE SEEN (ref 0–5)
WBC, UA: >50 WBC/hpf (ref 0–5)

## 2022-04-01 LAB — CBC WITH DIFFERENTIAL/PLATELET
Abs Immature Granulocytes: 0.09 10*3/uL — ABNORMAL HIGH (ref 0.00–0.07)
Basophils Absolute: 0 10*3/uL (ref 0.0–0.1)
Basophils Relative: 0 %
Eosinophils Absolute: 0.2 10*3/uL (ref 0.0–0.5)
Eosinophils Relative: 1 %
HCT: 42.7 % (ref 39.0–52.0)
Hemoglobin: 13.5 g/dL (ref 13.0–17.0)
Immature Granulocytes: 1 %
Lymphocytes Relative: 9 %
Lymphs Abs: 1.6 10*3/uL (ref 0.7–4.0)
MCH: 28.2 pg (ref 26.0–34.0)
MCHC: 31.6 g/dL (ref 30.0–36.0)
MCV: 89.1 fL (ref 80.0–100.0)
Monocytes Absolute: 0.7 10*3/uL (ref 0.1–1.0)
Monocytes Relative: 4 %
Neutro Abs: 15.3 10*3/uL — ABNORMAL HIGH (ref 1.7–7.7)
Neutrophils Relative %: 85 %
Platelets: 356 10*3/uL (ref 150–400)
RBC: 4.79 MIL/uL (ref 4.22–5.81)
RDW: 16.8 % — ABNORMAL HIGH (ref 11.5–15.5)
WBC: 18 10*3/uL — ABNORMAL HIGH (ref 4.0–10.5)
nRBC: 0 % (ref 0.0–0.2)

## 2022-04-01 LAB — URINALYSIS, ROUTINE W REFLEX MICROSCOPIC
Glucose, UA: 100 mg/dL — AB
Ketones, ur: NEGATIVE mg/dL
Nitrite: POSITIVE — AB
Protein, ur: 100 mg/dL — AB
Specific Gravity, Urine: 1.015 (ref 1.005–1.030)
pH: 5 (ref 5.0–8.0)

## 2022-04-01 LAB — SODIUM: Sodium: 157 mmol/L — ABNORMAL HIGH (ref 135–145)

## 2022-04-01 MED ORDER — SODIUM CHLORIDE 0.9 % IV SOLN: INTRAVENOUS | Status: DC

## 2022-04-01 MED ORDER — SODIUM CHLORIDE 0.9 % IV SOLN
1.0000 g | Freq: Once | INTRAVENOUS | Status: AC
  Administered 2022-04-01: 1 g via INTRAVENOUS
  Filled 2022-04-01: qty 10

## 2022-04-01 MED ORDER — HYDRALAZINE HCL 20 MG/ML IJ SOLN: 5.0000 mg | INTRAMUSCULAR | Status: DC | PRN

## 2022-04-01 MED ORDER — SODIUM CHLORIDE 0.45 % IV SOLN: INTRAVENOUS | Status: DC

## 2022-04-01 MED ORDER — POTASSIUM CHLORIDE 10 MEQ/100ML IV SOLN
10.0000 meq | INTRAVENOUS | Status: AC
  Administered 2022-04-01 – 2022-04-02 (×3): 10 meq via INTRAVENOUS
  Filled 2022-04-01 (×4): qty 100

## 2022-04-01 MED ORDER — THIAMINE HCL 100 MG/ML IJ SOLN
100.0000 mg | Freq: Every day | INTRAMUSCULAR | Status: DC
  Administered 2022-04-01 – 2022-04-05 (×5): 100 mg via INTRAVENOUS
  Filled 2022-04-01 (×5): qty 2

## 2022-04-01 MED ORDER — SODIUM CHLORIDE 0.9 % IV SOLN
2.0000 g | Freq: Once | INTRAVENOUS | Status: AC
  Administered 2022-04-02: 2 g via INTRAVENOUS
  Filled 2022-04-01: qty 20

## 2022-04-01 MED ORDER — SODIUM CHLORIDE 0.9 % IV BOLUS
500.0000 mL | Freq: Once | INTRAVENOUS | Status: AC
  Administered 2022-04-01: 500 mL via INTRAVENOUS

## 2022-04-01 NOTE — ED Notes (Signed)
Bladder Scanner revealed of urine in bladder--MD made aware
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
## 2022-04-01 NOTE — ED Triage Notes (Signed)
Pt arrived via [facility] from [place] with c/o unresponsiveness and slow breathing. Facility states that he normally walks and talks. Per EMS, pt is responsive to pain and voice . CBG 150, SpO2 84% on RA placed on 2 L and now in the 90s.
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
## 2022-04-01 NOTE — ED Notes (Signed)
Patient transported to CT 

## 2022-04-02 DIAGNOSIS — E43 Unspecified severe protein-calorie malnutrition: Secondary | ICD-10-CM

## 2022-04-02 LAB — PROCALCITONIN: Procalcitonin: 0.56 ng/mL

## 2022-04-02 LAB — BASIC METABOLIC PANEL
Anion gap: 8 (ref 5–15)
BUN: 61 mg/dL — ABNORMAL HIGH (ref 8–23)
CO2: 19 mmol/L — ABNORMAL LOW (ref 22–32)
Calcium: 8.9 mg/dL (ref 8.9–10.3)
Chloride: 130 mmol/L — ABNORMAL HIGH (ref 98–111)
Creatinine, Ser: 1.99 mg/dL — ABNORMAL HIGH (ref 0.61–1.24)
GFR, Estimated: 33 mL/min — ABNORMAL LOW (ref >60)
Glucose, Bld: 89 mg/dL (ref 70–99)
Potassium: 3.7 mmol/L (ref 3.5–5.1)
Sodium: 157 mmol/L — ABNORMAL HIGH (ref 135–145)

## 2022-04-02 LAB — CBC
HCT: 46.9 % (ref 39.0–52.0)
Hemoglobin: 14 g/dL (ref 13.0–17.0)
MCH: 27.5 pg (ref 26.0–34.0)
MCHC: 29.9 g/dL — ABNORMAL LOW (ref 30.0–36.0)
MCV: 92.1 fL (ref 80.0–100.0)
Platelets: 262 10*3/uL (ref 150–400)
RBC: 5.09 MIL/uL (ref 4.22–5.81)
RDW: 17.2 % — ABNORMAL HIGH (ref 11.5–15.5)
WBC: 11.1 10*3/uL — ABNORMAL HIGH (ref 4.0–10.5)
nRBC: 0 % (ref 0.0–0.2)

## 2022-04-02 LAB — SODIUM
Sodium: 154 mmol/L — ABNORMAL HIGH (ref 135–145)
Sodium: 155 mmol/L — ABNORMAL HIGH (ref 135–145)
Sodium: 151 mmol/L — ABNORMAL HIGH (ref 135–145)

## 2022-04-02 LAB — MAGNESIUM: Magnesium: 2.8 mg/dL — ABNORMAL HIGH (ref 1.7–2.4)

## 2022-04-02 LAB — PHOSPHORUS: Phosphorus: 2.5 mg/dL (ref 2.5–4.6)

## 2022-04-02 MED ORDER — ORAL CARE MOUTH RINSE
15.0000 mL | OROMUCOSAL | Status: DC | PRN
  Administered 2022-04-02: 15 mL via OROMUCOSAL

## 2022-04-02 MED ORDER — MEGESTROL ACETATE 400 MG/10ML PO SUSP
400.0000 mg | Freq: Two times a day (BID) | ORAL | Status: DC
  Administered 2022-04-02 – 2022-04-04 (×5): 400 mg via ORAL
  Filled 2022-04-02 (×6): qty 10

## 2022-04-02 MED ORDER — DEXTROSE 5 % IV SOLN: INTRAVENOUS | Status: DC

## 2022-04-02 MED ORDER — POTASSIUM PHOSPHATES 15 MMOLE/5ML IV SOLN
15.0000 mmol | Freq: Once | INTRAVENOUS | Status: AC
  Administered 2022-04-02: 15 mmol via INTRAVENOUS
  Filled 2022-04-02: qty 5

## 2022-04-02 MED ORDER — ENSURE ENLIVE PO LIQD
237.0000 mL | Freq: Three times a day (TID) | ORAL | Status: DC
  Administered 2022-04-02: 120 mL via ORAL
  Administered 2022-04-03: 237 mL via ORAL
  Administered 2022-04-03: 120 mL via ORAL
  Administered 2022-04-04 (×3): 237 mL via ORAL

## 2022-04-02 MED ORDER — HEPARIN SODIUM (PORCINE) 5000 UNIT/ML IJ SOLN
5000.0000 [IU] | Freq: Three times a day (TID) | INTRAMUSCULAR | Status: DC
  Filled 2022-04-02: qty 1

## 2022-04-02 MED ORDER — TAMSULOSIN HCL 0.4 MG PO CAPS
0.4000 mg | ORAL_CAPSULE | Freq: Every day | ORAL | Status: DC
  Administered 2022-04-02 – 2022-04-03 (×2): 0.4 mg via ORAL
  Filled 2022-04-02 (×3): qty 1

## 2022-04-02 MED ORDER — APIXABAN 2.5 MG PO TABS
2.5000 mg | ORAL_TABLET | Freq: Two times a day (BID) | ORAL | Status: DC
  Administered 2022-04-02 – 2022-04-04 (×5): 2.5 mg via ORAL
  Filled 2022-04-02 (×6): qty 1
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
## 2022-04-03 DIAGNOSIS — N3000 Acute cystitis without hematuria: Secondary | ICD-10-CM

## 2022-04-03 DIAGNOSIS — E860 Dehydration: Secondary | ICD-10-CM

## 2022-04-03 LAB — BASIC METABOLIC PANEL
Anion gap: 4 — ABNORMAL LOW (ref 5–15)
BUN: 44 mg/dL — ABNORMAL HIGH (ref 8–23)
CO2: 22 mmol/L (ref 22–32)
Calcium: 8.9 mg/dL (ref 8.9–10.3)
Chloride: 127 mmol/L — ABNORMAL HIGH (ref 98–111)
Creatinine, Ser: 1.61 mg/dL — ABNORMAL HIGH (ref 0.61–1.24)
GFR, Estimated: 42 mL/min — ABNORMAL LOW (ref >60)
Glucose, Bld: 96 mg/dL (ref 70–99)
Potassium: 3.4 mmol/L — ABNORMAL LOW (ref 3.5–5.1)
Sodium: 153 mmol/L — ABNORMAL HIGH (ref 135–145)

## 2022-04-03 LAB — CBC
HCT: 41.6 % (ref 39.0–52.0)
Hemoglobin: 13 g/dL (ref 13.0–17.0)
MCH: 27.9 pg (ref 26.0–34.0)
MCHC: 31.3 g/dL (ref 30.0–36.0)
MCV: 89.3 fL (ref 80.0–100.0)
Platelets: 332 10*3/uL (ref 150–400)
RBC: 4.66 MIL/uL (ref 4.22–5.81)
RDW: 16.9 % — ABNORMAL HIGH (ref 11.5–15.5)
WBC: 10 10*3/uL (ref 4.0–10.5)
nRBC: 0 % (ref 0.0–0.2)

## 2022-04-03 LAB — SODIUM
Sodium: 152 mmol/L — ABNORMAL HIGH (ref 135–145)
Sodium: 144 mmol/L (ref 135–145)
Sodium: 148 mmol/L — ABNORMAL HIGH (ref 135–145)

## 2022-04-03 LAB — MAGNESIUM: Magnesium: 2.6 mg/dL — ABNORMAL HIGH (ref 1.7–2.4)

## 2022-04-03 LAB — PHOSPHORUS: Phosphorus: 2.4 mg/dL — ABNORMAL LOW (ref 2.5–4.6)

## 2022-04-03 LAB — PROCALCITONIN: Procalcitonin: 0.44 ng/mL

## 2022-04-03 MED ORDER — CHLORHEXIDINE GLUCONATE CLOTH 2 % EX PADS
6.0000 | MEDICATED_PAD | Freq: Every day | CUTANEOUS | Status: DC
  Administered 2022-04-03 – 2022-04-05 (×3): 6 via TOPICAL

## 2022-04-03 MED ORDER — FREE WATER
100.0000 mL | Freq: Three times a day (TID) | Status: DC
  Administered 2022-04-03: 100 mL via ORAL
  Administered 2022-04-03: 150 mL via ORAL
  Administered 2022-04-04: 100 mL via ORAL

## 2022-04-03 NOTE — Progress Notes (Signed)
Will state name and sometimes says something appropriate to situation.  With coaxing will drink some from straw and drank some water today and half of ensure tonight.  Had large loose bm and received bath, linen change and foley care.  Will not keep gown on.

## 2022-04-03 NOTE — Progress Notes (Signed)
Pt making nonsensical conversation with this nurse during shift.  He is able to state "no" when asked about physical symptoms like pain, nausea, shortness of breath.  Drank 50% of ensure and took hs meds in applesauce.  Unable to get pt to keep hospital gown on while in bed.  Kept temp up in room and blankets on patient.  Foley patent.  Pt turns self in bed.
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
## 2022-04-04 LAB — SODIUM
Sodium: 147 mmol/L — ABNORMAL HIGH (ref 135–145)
Sodium: 145 mmol/L (ref 135–145)

## 2022-04-04 LAB — URINE CULTURE: Culture: >=100000 — AB

## 2022-04-04 LAB — PHOSPHORUS: Phosphorus: 2.1 mg/dL — ABNORMAL LOW (ref 2.5–4.6)

## 2022-04-04 LAB — MAGNESIUM: Magnesium: 2.4 mg/dL (ref 1.7–2.4)

## 2022-04-04 LAB — PROCALCITONIN: Procalcitonin: 0.15 ng/mL

## 2022-04-04 MED ORDER — CEFDINIR 300 MG PO CAPS
300.0000 mg | ORAL_CAPSULE | Freq: Every day | ORAL | Status: DC
  Administered 2022-04-04: 300 mg via ORAL
  Filled 2022-04-04 (×2): qty 1

## 2022-04-04 NOTE — Plan of Care (Signed)
  Problem: Acute Rehab OT Goals (only OT should resolve) Goal: Pt. Will Perform Grooming Flowsheets (Taken 04/04/2022 1551) Pt Will Perform Grooming:  with min assist  sitting Goal: Pt. Will Perform Upper Body Bathing Flowsheets (Taken 04/04/2022 1551) Pt Will Perform Upper Body Bathing:  with min assist  sitting Goal: Pt. Will Perform Upper Body Dressing Flowsheets (Taken 04/04/2022 1551) Pt Will Perform Upper Body Dressing:  with min assist  sitting Goal: Pt. Will Perform Lower Body Dressing Flowsheets (Taken 04/04/2022 1551) Pt Will Perform Lower Body Dressing:  with mod assist  sitting/lateral leans Goal: Pt. Will Transfer To Toilet Flowsheets (Taken 04/04/2022 1551) Pt Will Transfer to Toilet:  with min guard assist  squat pivot transfer Goal: Pt/Caregiver Will Perform Home Exercise Program Flowsheets (Taken 04/04/2022 1551) Pt/caregiver will Perform Home Exercise Program:  Increased ROM  Both right and left upper extremity  With Supervision  Increased strength  Maizie Garno OT, MOT
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
## 2022-04-04 NOTE — Progress Notes (Signed)
Pt drank 50% ensure last noc and 150 ml water.  Pt able to sleep overnight.  Stayed in gown and covers in place.  Afebrile.  Denies pain when asked.  Foley patent.  Large BM this shift.
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
## 2022-04-04 NOTE — Plan of Care (Signed)
  Problem: Acute Rehab PT Goals(only PT should resolve) Goal: Pt Will Go Supine/Side To Sit Outcome: Progressing Flowsheets (Taken 04/04/2022 1549) Pt will go Supine/Side to Sit:  with minimal assist  with moderate assist Goal: Patient Will Transfer Sit To/From Stand Outcome: Progressing Flowsheets (Taken 04/04/2022 1549) Patient will transfer sit to/from stand: with minimal assist Goal: Pt Will Transfer Bed To Chair/Chair To Bed Outcome: Progressing Flowsheets (Taken 04/04/2022 1549) Pt will Transfer Bed to Chair/Chair to Bed:  with min assist  with mod assist Goal: Pt Will Ambulate Outcome: Progressing Flowsheets (Taken 04/04/2022 1549) Pt will Ambulate:  10 feet  with moderate assist  with rolling walker   3:50 PM, 04/04/22 Ocie Bob, MPT Physical Therapist with [facility] [phone] office [phone] mobile phone

## 2022-04-04 NOTE — [place] FL2 (Signed)
[place] MEDICAID FL2 LEVEL OF CARE FORM     IDENTIFICATION  Patient Name: Dominic Smith Birthdate: [birth year] Sex: male Admission Date (Current Location): 04/01/2022  [facility] and [place] Number:  Engineer, maintenance and Address:  [facility],  [address] [place] [address]      Provider Number: [id number]  Attending Physician Name and Address:  Vassie Loll, MD  Relative Name and Phone Number:       Current Level of Care: Hospital Recommended Level of Care: Skilled Nursing Facility Prior Approval Number:    Date Approved/Denied:   PASRR Number:    Discharge Plan: SNF    Current Diagnoses: Patient Active Problem List   Diagnosis Date Noted   Sepsis (HCC) 04/01/2022   Closed fracture of head of right humerus    Azotemia    Urinary retention    Hypervolemia    Protein-calorie malnutrition, severe 01/19/2022   TBI (traumatic brain injury) (HCC) 01/19/2022   HCAP (healthcare-associated pneumonia) 01/19/2022   Rib fracture 01/10/2022   Acute cystitis with hematuria    AKI (acute kidney injury) (HCC) 05/15/2021   Acute lower UTI 05/15/2021   Dehydration 05/15/2021   Heart murmur 05/15/2021   Mild aortic valve stenosis: Mild to moderate aortic valve stenosis per 2D echo 05/15/2021 05/15/2021   Acute metabolic encephalopathy 05/14/2021    Orientation RESPIRATION BLADDER Height & Weight     Self  Normal Indwelling catheter Weight: 120 lb (54.4 kg) Height:  5\' 11"  (180.3 cm)  BEHAVIORAL SYMPTOMS/MOOD NEUROLOGICAL BOWEL NUTRITION STATUS      Incontinent Diet (Dysphagia 3 with thin liquids. See d/c summary for updates.)  AMBULATORY STATUS COMMUNICATION OF NEEDS Skin   Extensive Assist Verbally Skin abrasions, Bruising                       Personal Care Assistance Level of Assistance  Bathing, Feeding, Dressing Bathing Assistance: Maximum assistance Feeding assistance: Maximum assistance Dressing Assistance: Maximum assistance      Functional Limitations Info  Sight, Hearing, Speech Sight Info: Adequate Hearing Info: Adequate Speech Info: Adequate    SPECIAL CARE FACTORS FREQUENCY                       Contractures      Additional Factors Info  Code Status, Allergies, Psychotropic, Isolation Precautions Code Status Info: Full code Allergies Info: No known allergies Psychotropic Info: Seroquel, Remeron   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     Current Medications (04/04/2022):  This is the current hospital active medication list Current Facility-Administered Medications  Medication Dose Route Frequency Provider Last Rate Last Admin   apixaban (ELIQUIS) tablet 2.5 mg  2.5 mg Oral BID Elgergawy, 14/02/2022, MD   2.5 mg at 04/04/22 0836   cefdinir (OMNICEF) capsule 300 mg  300 mg Oral Daily 14/11/23, MD   300 mg at 04/04/22 1154   Chlorhexidine Gluconate Cloth 2 % PADS 6 each  6 each Topical Daily Elgergawy, 14/11/23, MD   6 each at 04/04/22 0935   dextrose 5 % solution   Intravenous Continuous 14/11/23, MD 75 mL/hr at 04/04/22 0545 IV Pump Association at 04/04/22 0545   feeding supplement (ENSURE ENLIVE / ENSURE PLUS) liquid 237 mL  237 mL Oral TID BM Elgergawy, 14/11/23, MD   237 mL at 04/04/22 0949   free water 100 mL  100 mL Oral Q8H 14/11/23, MD   150 mL at  04/03/22 2223   hydrALAZINE (APRESOLINE) injection 5 mg  5 mg Intravenous Q4H PRN Elgergawy, Leana Roe, MD       megestrol (MEGACE) 400 MG/10ML suspension 400 mg  400 mg Oral BID Elgergawy, Leana Roe, MD   400 mg at 04/04/22 0836   Oral care mouth rinse  15 mL Mouth Rinse PRN Elgergawy, Leana Roe, MD   15 mL at 04/02/22 2240   tamsulosin (FLOMAX) capsule 0.4 mg  0.4 mg Oral QPC supper Elgergawy, Leana Roe, MD   0.4 mg at 04/03/22 1740   thiamine (VITAMIN B1) injection 100 mg  100 mg Intravenous Daily Elgergawy, Leana Roe, MD   100 mg at 04/04/22 0786     Discharge Medications: Please see discharge summary for a list of  discharge medications.  Relevant Imaging Results:  Relevant Lab Results:   Additional Information    Karn Cassis, LCSW
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
## 2022-04-04 NOTE — TOC Initial Note (Signed)
Transition of Care [facility]) - Initial/Assessment Note    Patient Details  Name: Dominic Smith MRN: [id number] Date of Birth: 03-01-1941  Transition of Care [facility]) CM/SW Contact:    Karn Cassis, LCSW Phone Number: 04/04/2022, 1:41 PM  Clinical Narrative:  Pt admitted due to sepsis. Assessment completed with pt's ex-wife, Britta Mccreedy as pt oriented to self only per chart. Britta Mccreedy indicates pt has been a resident at [facility] for several weeks. She asked if Medicaid application has been completed. Per Jerene Dilling, Artist, Medicaid application is pending. Pt d/c to [facility] on 11/14 from [place]. Barbara requests placement in [place] or [place] if possible. Will initiate bed search, but Britta Mccreedy understands that pt will need to return to [facility] if no offers made. Per Eunice Blase at [facility], okay to return.                 Expected Discharge Plan: Skilled Nursing Facility Barriers to Discharge: Continued Medical Work up   Patient Goals and CMS Choice Patient states their goals for this hospitalization and ongoing recovery are:: return to SNF      Expected Discharge Plan and Services Expected Discharge Plan: Skilled Nursing Facility In-house Referral: Clinical Social Work   Post Acute Care Choice: Skilled Nursing Facility Living arrangements for the past 2 months: Skilled Nursing Facility                                      Prior Living Arrangements/Services Living arrangements for the past 2 months: Skilled Nursing Facility Lives with:: Facility Resident Patient language and need for interpreter reviewed:: Yes Do you feel safe going back to the place where you live?: Yes      Need for Family Participation in Patient Care: Yes (Comment) Care giver support system in place?: Yes (comment)   Criminal Activity/Legal Involvement Pertinent to Current Situation/Hospitalization: No - Comment as needed  Activities of Daily Living Home  Assistive Devices/Equipment: None ADL Screening (condition at time of admission) Patient's cognitive ability adequate to safely complete daily activities?: No Is the patient deaf or have difficulty hearing?: No Does the patient have difficulty seeing, even when wearing glasses/contacts?: No Does the patient have difficulty concentrating, remembering, or making decisions?: Yes Patient able to express need for assistance with ADLs?: No Does the patient have difficulty dressing or bathing?: Yes Independently performs ADLs?: No Communication: Needs assistance Dressing (OT): Dependent Grooming: Dependent Feeding: Dependent Bathing: Dependent Toileting: Dependent In/Out Bed: Dependent Walks in Home: Dependent Is this a change from baseline?: Pre-admission baseline Does the patient have difficulty walking or climbing stairs?: Yes Weakness of Legs: Both Weakness of Arms/Hands: Both  Permission Sought/Granted                  Emotional Assessment   Attitude/Demeanor/Rapport: Unable to Assess Affect (typically observed): Unable to Assess Orientation: : Oriented to Self Alcohol / Substance Use: Not Applicable Psych Involvement: No (comment)  Admission diagnosis:  Acute cystitis without hematuria [N30.00] AKI (acute kidney injury) (HCC) [N17.9] Sepsis (HCC) [A41.9] Altered mental status, unspecified altered mental status type [R41.82] Patient Active Problem List   Diagnosis Date Noted   Sepsis (HCC) 04/01/2022   Closed fracture of head of right humerus    Azotemia    Urinary retention    Hypervolemia    Protein-calorie malnutrition, severe 01/19/2022   TBI (traumatic brain injury) (HCC) 01/19/2022   HCAP (  healthcare-associated pneumonia) 01/19/2022   Rib fracture 01/10/2022   Acute cystitis with hematuria    AKI (acute kidney injury) (HCC) 05/15/2021   Acute lower UTI 05/15/2021   Dehydration 05/15/2021   Heart murmur 05/15/2021   Mild aortic valve stenosis: Mild to  moderate aortic valve stenosis per 2D echo 05/15/2021 05/15/2021   Acute metabolic encephalopathy 05/14/2021   PCP:  Sherol Dade, DO Pharmacy:   [facility] [address] [place] [address] Phone: [phone] Fax: [phone]  [facility] [address] [place] [address] Phone: [phone] Fax: [phone]     Social Determinants of Health (SDOH) Interventions    Readmission Risk Interventions    04/04/2022    1:40 PM  Readmission Risk Prevention Plan  Transportation Screening Complete  HRI or Home Care Consult Complete  Social Work Consult for Recovery Care Planning/Counseling Complete  Palliative Care Screening Not Applicable  Medication Review Oceanographer) Complete

## 2022-04-05 LAB — BASIC METABOLIC PANEL
Anion gap: 5 (ref 5–15)
BUN: 25 mg/dL — ABNORMAL HIGH (ref 8–23)
CO2: 21 mmol/L — ABNORMAL LOW (ref 22–32)
Calcium: 8.5 mg/dL — ABNORMAL LOW (ref 8.9–10.3)
Chloride: 119 mmol/L — ABNORMAL HIGH (ref 98–111)
Creatinine, Ser: 1.32 mg/dL — ABNORMAL HIGH (ref 0.61–1.24)
GFR, Estimated: 54 mL/min — ABNORMAL LOW (ref >60)
Glucose, Bld: 85 mg/dL (ref 70–99)
Potassium: 3.4 mmol/L — ABNORMAL LOW (ref 3.5–5.1)
Sodium: 145 mmol/L (ref 135–145)

## 2022-04-05 MED ORDER — METOPROLOL TARTRATE 25 MG PO TABS: 12.5000 mg | ORAL_TABLET | Freq: Two times a day (BID) | ORAL | Status: DC

## 2022-04-05 MED ORDER — CEFDINIR 300 MG PO CAPS: 300.0000 mg | ORAL_CAPSULE | Freq: Two times a day (BID) | ORAL | Status: DC

## 2022-04-05 MED ORDER — CEFDINIR 300 MG PO CAPS: 300.0000 mg | ORAL_CAPSULE | Freq: Two times a day (BID) | ORAL | 0 refills | Status: AC
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
## 2022-04-05 NOTE — Care Management Important Message (Signed)
Important Message  Patient Details  Name: Dominic Smith MRN: [id number] Date of Birth: 04-Oct-1940   Medicare Important Message Given:  N/A - LOS <3 / Initial given by admissions     Corey Harold 04/05/2022, 2:02 PM

## 2022-04-05 NOTE — Progress Notes (Signed)
Inserted 55fr foley. Pt tolerated well. Assisted by Laurin Coder, NT

## 2022-04-05 NOTE — Progress Notes (Signed)
Removed pt's foley per Dr. Gwenlyn Perking at [phone]. Pt tolerated well. Bladder scanned pt d/t no voiding at this time. Scan showed >156. MD notified.

## 2022-04-05 NOTE — Progress Notes (Signed)
Called and gave report to nurse Alona Bene at [facility]. Pt resting comfortably in room awaiting transport. Will continue to monitor pt.

## 2022-04-05 NOTE — TOC Transition Note (Signed)
Transition of Care [facility]) - CM/SW Discharge Note   Patient Details  Name: Dominic Smith MRN: [id number] Date of Birth: 12/03/1940  [facility] CM/SW Contact:  Villa Herb, LCSWA Phone Number: 04/05/2022, 4:01 PM   Clinical Narrative:    CSW updated that pt is medically ready. CSW spoke to [place] with [facility] who states that they can accept pt back to their facility today. Debbie provided CSW with numbers for room and report. CSW provided numbers to RN. CSW spoke to pts contact [facility] to update that no other facilities made bed offers and that pt will need to return to LTC at [facility]. Ms. Pretty is understanding of this and will continue working with the facility on alternate placement options. CSW to call for EMS when RN is ready. TOC signing off.   Final next level of care: Long Term Nursing Home Barriers to Discharge: Barriers Resolved   Patient Goals and CMS Choice Patient states their goals for this hospitalization and ongoing recovery are:: go to SNF      Discharge Placement              Patient chooses bed at: [facility] Patient to be transferred to facility by: EMS Name of family member notified: [facility]ane Patient and family notified of of transfer: 04/05/22  Discharge Plan and Services In-house Referral: Clinical Social Work   Post Acute Care Choice: Skilled Nursing Facility                               Social Determinants of Health (SDOH) Interventions     Readmission Risk Interventions    04/04/2022    1:40 PM  Readmission Risk Prevention Plan  Transportation Screening Complete  HRI or Home Care Consult Complete  Social Work Consult for Recovery Care Planning/Counseling Complete  Palliative Care Screening Not Applicable  Medication Review Ocean[facility]) Complete

## 2022-04-05 NOTE — Plan of Care (Signed)
Pt continues to refuse or only take sips of water and boost. Pt refuses meds at time and reluctant for staff to perform care.  Problem: Nutrition: Goal: Adequate nutrition will be maintained Outcome: Not Progressing   Problem: Pain Managment: Goal: General experience of comfort will improve Outcome: Progressing   Problem: Skin Integrity: Goal: Risk for impaired skin integrity will decrease Outcome: Progressing

## 2022-04-05 NOTE — Progress Notes (Signed)
Pt is not alert and awake enough to administer medications. Pt responds when his name is called and refuses to eat or drink. MD has been notified. Removed foley as per order. Pt tolerated well. Will continue to monitor pt.

## 2022-04-06 ENCOUNTER — Inpatient Hospital Stay: Payer: Self-pay | Admitting: Physical Medicine & Rehabilitation

## 2022-04-11 ENCOUNTER — Encounter: Payer: Self-pay | Admitting: Urology

## 2022-04-11 ENCOUNTER — Ambulatory Visit (INDEPENDENT_AMBULATORY_CARE_PROVIDER_SITE_OTHER): Payer: Self-pay | Admitting: Urology

## 2022-04-11 ENCOUNTER — Telehealth: Payer: Self-pay

## 2022-04-11 VITALS — Ht 71.0 in | Wt 120.0 lb

## 2022-04-11 DIAGNOSIS — N401 Enlarged prostate with lower urinary tract symptoms: Secondary | ICD-10-CM

## 2022-04-11 DIAGNOSIS — N138 Other obstructive and reflux uropathy: Secondary | ICD-10-CM

## 2022-04-11 DIAGNOSIS — R339 Retention of urine, unspecified: Secondary | ICD-10-CM

## 2022-04-11 NOTE — Progress Notes (Signed)
04/11/2022 10:50 AM   Dominic Smith 01/18/1941 [id number]  Referring provider: Sherol Dade, DO [address] [place],  [place] [address]  No chief complaint on file.   HPI:  New pt -   BPH - pt admitted Dec 2023 with AMS changes UA with many bacteria and urinary retention. A foley was placed and about 800 ml drained. Urine cx grew klebsiella. Sep 2023 CT without hydronephrosis, mass or stone and about a 30 g prostate. He has CKD with baseline Cr ~ 1.3. He is on tamsulosin.   Today, seen for the above. He went to a SNF and still has a foley. Has severe dementia. He doesn't ambulate. He says he is OK. Here with a staff member. She says he is alert today. He is on oral cefdinir, so it's as good a time as any to attempt a voiding trial. He was filled to 500 ml and had no urge to void. Foley placed back to gravity.    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  Home Medications:  Allergies as of 04/11/2022   No Known Allergies      Medication List        Accurate as of April 11, 2022 10:50 AM. If you have any questions, ask your nurse or doctor.          acetaminophen 325 MG tablet Commonly known as: TYLENOL Place 1-2 tablets (325-650 mg total) into feeding tube every 4 (four) hours as needed for mild pain.   alum & mag hydroxide-simeth 200-200-20 MG/5ML suspension Commonly known as: MAALOX/MYLANTA Take 30 mLs by mouth every 4 (four) hours as needed for indigestion.   apixaban 2.5 MG Tabs tablet Commonly known as: ELIQUIS Take 2.5 mg by mouth 2 (two) times daily.   aspirin 81 MG chewable tablet Chew 1 tablet (81 mg total) by mouth daily.   B-complex with vitamin C tablet Take 1 tablet by mouth daily.   Chlorhexidine  Gluconate Cloth 2 % Pads Apply 6 each topically daily at 6 (six) AM.   feeding supplement Liqd Take 237 mLs by mouth 3 (three) times daily between meals.   Gerhardt's butt cream Crea Apply 1 Application topically 2 (two) times daily.   megestrol 400 MG/10ML suspension Commonly known as: MEGACE Take 10 mLs (400 mg total) by mouth 2 (two) times daily.   metoprolol tartrate 25 MG tablet Commonly known as: LOPRESSOR Take 0.5 tablets (12.5 mg total) by mouth 2 (two) times daily.   mirtazapine 7.5 MG tablet Commonly known as: REMERON Take 7.5 mg by mouth at bedtime.   mouth rinse Liqd solution 15 mLs by Mouth Rinse route as needed (oral care).   polyethylene glycol 17 g packet Commonly known as: MIRALAX / GLYCOLAX Place 17 g into feeding tube daily.   QUEtiapine 50 MG tablet Commonly known as: SEROQUEL Take 1 tablet (50 mg total) by mouth every 8 (eight) hours as needed (agitation).   tamsulosin 0.4 MG Caps capsule Commonly known as: FLOMAX Take 1 capsule (0.4 mg total) by mouth daily after supper.        Allergies: No Known Allergies  Family History: No family history on file.  Social History:  reports that he has never  smoked. He has never used smokeless tobacco. He reports that he does not drink alcohol and does not use drugs.   Physical Exam: Ht 5\' 11"  (1.803 m)   Wt 120 lb (54.4 kg)   BMI 16.74 kg/m   Constitutional:  Alert, No acute distress. He is thin and frail.  HEENT: [place] AT, moist mucus membranes.  Trachea midline, no masses. Cardiovascular: No clubbing, cyanosis, or edema. Respiratory: Normal respiratory effort, no increased work of breathing. GI: Abdomen is soft, nontender, nondistended, no abdominal masses GU: No CVA tenderness Skin: No rashes, bruises or suspicious lesions. Neurologic: Grossly intact, no focal deficits, moving all 4 extremities. Psychiatric: Normal mood and affect. GU: foley in place- urine clear.   Laboratory Data: Lab Results   Component Value Date   WBC 10.0 04/03/2022   HGB 13.0 04/03/2022   HCT 41.6 04/03/2022   MCV 89.3 04/03/2022   PLT 332 04/03/2022    Lab Results  Component Value Date   CREATININE 1.32 (H) 04/05/2022    No results found for: "PSA"  No results found for: "TESTOSTERONE"  No results found for: "HGBA1C"  Urinalysis    Component Value Date/Time   COLORURINE RED (A) 04/01/2022 0817   APPEARANCEUR CLOUDY (A) 04/01/2022 0817   LABSPEC 1.015 04/01/2022 0817   PHURINE 5.0 04/01/2022 0817   GLUCOSEU 100 (A) 04/01/2022 0817   HGBUR LARGE (A) 04/01/2022 0817   BILIRUBINUR MODERATE (A) 04/01/2022 0817   KETONESUR NEGATIVE 04/01/2022 0817   PROTEINUR 100 (A) 04/01/2022 0817   NITRITE POSITIVE (A) 04/01/2022 0817   LEUKOCYTESUR LARGE (A) 04/01/2022 0817    Lab Results  Component Value Date   BACTERIA MANY (A) 04/01/2022    Pertinent Imaging: CT 2023   Results for orders placed during the hospital encounter of 05/13/21  05/15/21 RENAL  Narrative CLINICAL DATA:  Acute kidney injury  EXAM: RENAL / URINARY TRACT ULTRASOUND COMPLETE  COMPARISON:  None  FINDINGS: Right Kidney:  Renal measurements: 10.0 x 4.6 x 4.8 cm = volume: 116 mL. Normal cortical thickness with upper normal cortical echogenicity. No mass, hydronephrosis, or shadowing calcification.  Left Kidney:  Renal measurements: 10.4 x 4.6 x 5.3 cm = volume: 132 mL. Normal cortical thickness and echogenicity. No mass, hydronephrosis, or shadowing calcification.  Bladder:  Dependent echogenic material within the bladder, likely layered debris. No definite mass or wall thickening.  Other:  N/A  IMPRESSION: Probable layered dependent debris within urinary bladder.  Otherwise negative exam.   Electronically Signed By: [place] M.D. On: 05/14/2021 09:01  No valid procedures specified. No results found for this or any previous visit.  No results found for this or any previous visit.   Assessment  & Plan:    UTI - resolved. No predisposing factors on CT. Likely from retention and decreased po intake.   Urinary retention - foley replaced. Change around 05/02/2022. Benefits of foley outweigh the risks. Consider cysto and void trial in Feb 2024 depending on how he is doing.   BPH - cont tamsulosin.   No follow-ups on file.  Mar 2024, MD  [facility]  [address] [place], [place] [phone]

## 2022-04-11 NOTE — Telephone Encounter (Signed)
Patient's Ex Wife called to speak with clinical staff to discuss pt's care.  She was not able to come to patient appointment.  Call back:  [phone]  Milinda Antis, Rosey Bath

## 2022-04-11 NOTE — Progress Notes (Signed)
Patient came in for a voiding trial today. Bladder irrigation of and patient was unable to void. Per Dr. Mena Goes cathter was not remove and patient cathter stayed in place.

## 2022-04-12 NOTE — Telephone Encounter (Signed)
Return call to patient's Ex wife and made her aware that the patient did not have a DPR in place for me to discuss patient's medical care. Made patient's ex wife aware that she will have to contact the nursing home to get information about the patient's care. Made wife aware to please get a DPR in place so that we can discuss patient's medical care. Wife voiced understanding

## 2022-04-19 ENCOUNTER — Other Ambulatory Visit: Payer: Self-pay

## 2022-04-19 ENCOUNTER — Inpatient Hospital Stay (HOSPITAL_COMMUNITY): Payer: Medicare Other

## 2022-04-19 ENCOUNTER — Encounter (HOSPITAL_COMMUNITY): Payer: Self-pay | Admitting: *Deleted

## 2022-04-19 ENCOUNTER — Emergency Department (HOSPITAL_COMMUNITY): Payer: Medicare Other

## 2022-04-19 ENCOUNTER — Inpatient Hospital Stay (HOSPITAL_COMMUNITY)
Admission: EM | Admit: 2022-04-19 | Discharge: 2022-04-29 | DRG: 640 | Disposition: A | Discharge status: Hospice | Payer: Medicare Other | Attending: Family Medicine | Admitting: Family Medicine

## 2022-04-19 DIAGNOSIS — Z8673 Personal history of transient ischemic attack (TIA), and cerebral infarction without residual deficits: Secondary | ICD-10-CM

## 2022-04-19 DIAGNOSIS — R627 Adult failure to thrive: Secondary | ICD-10-CM | POA: Diagnosis present

## 2022-04-19 DIAGNOSIS — N1832 Chronic kidney disease, stage 3b: Secondary | ICD-10-CM | POA: Diagnosis present

## 2022-04-19 DIAGNOSIS — E876 Hypokalemia: Secondary | ICD-10-CM | POA: Diagnosis present

## 2022-04-19 DIAGNOSIS — Z7982 Long term (current) use of aspirin: Secondary | ICD-10-CM | POA: Diagnosis not present

## 2022-04-19 DIAGNOSIS — E861 Hypovolemia: Secondary | ICD-10-CM | POA: Diagnosis present

## 2022-04-19 DIAGNOSIS — Z7901 Long term (current) use of anticoagulants: Secondary | ICD-10-CM

## 2022-04-19 DIAGNOSIS — E860 Dehydration: Secondary | ICD-10-CM | POA: Diagnosis present

## 2022-04-19 DIAGNOSIS — R1310 Dysphagia, unspecified: Secondary | ICD-10-CM | POA: Diagnosis present

## 2022-04-19 DIAGNOSIS — U071 COVID-19: Secondary | ICD-10-CM | POA: Diagnosis present

## 2022-04-19 DIAGNOSIS — E43 Unspecified severe protein-calorie malnutrition: Secondary | ICD-10-CM | POA: Diagnosis present

## 2022-04-19 DIAGNOSIS — I350 Nonrheumatic aortic (valve) stenosis: Secondary | ICD-10-CM | POA: Diagnosis present

## 2022-04-19 DIAGNOSIS — E870 Hyperosmolality and hypernatremia: Secondary | ICD-10-CM | POA: Diagnosis present

## 2022-04-19 DIAGNOSIS — Z681 Body mass index (BMI) 19 or less, adult: Secondary | ICD-10-CM | POA: Diagnosis not present

## 2022-04-19 DIAGNOSIS — R64 Cachexia: Secondary | ICD-10-CM | POA: Diagnosis present

## 2022-04-19 DIAGNOSIS — R339 Retention of urine, unspecified: Secondary | ICD-10-CM | POA: Diagnosis present

## 2022-04-19 DIAGNOSIS — Z515 Encounter for palliative care: Secondary | ICD-10-CM

## 2022-04-19 DIAGNOSIS — S069X0S Unspecified intracranial injury without loss of consciousness, sequela: Secondary | ICD-10-CM

## 2022-04-19 DIAGNOSIS — Z79899 Other long term (current) drug therapy: Secondary | ICD-10-CM | POA: Diagnosis not present

## 2022-04-19 DIAGNOSIS — Z66 Do not resuscitate: Secondary | ICD-10-CM | POA: Diagnosis present

## 2022-04-19 DIAGNOSIS — N179 Acute kidney failure, unspecified: Secondary | ICD-10-CM | POA: Diagnosis present

## 2022-04-19 DIAGNOSIS — S069XAA Unspecified intracranial injury with loss of consciousness status unknown, initial encounter: Secondary | ICD-10-CM | POA: Diagnosis present

## 2022-04-19 LAB — RESP PANEL BY RT-PCR (RSV, FLU A&B, COVID)  RVPGX2
Influenza A by PCR: NEGATIVE
Influenza B by PCR: NEGATIVE
Resp Syncytial Virus by PCR: NEGATIVE
SARS Coronavirus 2 by RT PCR: POSITIVE — AB

## 2022-04-19 LAB — CBC WITH DIFFERENTIAL/PLATELET
Abs Immature Granulocytes: 0.07 10*3/uL (ref 0.00–0.07)
Basophils Absolute: 0 10*3/uL (ref 0.0–0.1)
Basophils Relative: 0 %
Eosinophils Absolute: 0.1 10*3/uL (ref 0.0–0.5)
Eosinophils Relative: 0 %
HCT: 47.4 % (ref 39.0–52.0)
Hemoglobin: 14.7 g/dL (ref 13.0–17.0)
Immature Granulocytes: 1 %
Lymphocytes Relative: 16 %
Lymphs Abs: 1.8 10*3/uL (ref 0.7–4.0)
MCH: 27.7 pg (ref 26.0–34.0)
MCHC: 31 g/dL (ref 30.0–36.0)
MCV: 89.3 fL (ref 80.0–100.0)
Monocytes Absolute: 0.4 10*3/uL (ref 0.1–1.0)
Monocytes Relative: 4 %
Neutro Abs: 9.1 10*3/uL — ABNORMAL HIGH (ref 1.7–7.7)
Neutrophils Relative %: 79 %
Platelets: 323 10*3/uL (ref 150–400)
RBC: 5.31 MIL/uL (ref 4.22–5.81)
RDW: 19.6 % — ABNORMAL HIGH (ref 11.5–15.5)
WBC: 11.4 10*3/uL — ABNORMAL HIGH (ref 4.0–10.5)
nRBC: 0 % (ref 0.0–0.2)

## 2022-04-19 LAB — URINALYSIS, ROUTINE W REFLEX MICROSCOPIC
Bilirubin Urine: NEGATIVE
Glucose, UA: NEGATIVE mg/dL
Ketones, ur: NEGATIVE mg/dL
Nitrite: NEGATIVE
Protein, ur: 100 mg/dL — AB
RBC / HPF: >50 RBC/hpf — ABNORMAL HIGH (ref 0–5)
Specific Gravity, Urine: 1.018 (ref 1.005–1.030)
WBC, UA: >50 WBC/hpf — ABNORMAL HIGH (ref 0–5)
pH: 5 (ref 5.0–8.0)

## 2022-04-19 LAB — COMPREHENSIVE METABOLIC PANEL
ALT: 27 U/L (ref 0–44)
AST: 23 U/L (ref 15–41)
Albumin: 3.1 g/dL — ABNORMAL LOW (ref 3.5–5.0)
Alkaline Phosphatase: 88 U/L (ref 38–126)
Anion gap: 12 (ref 5–15)
BUN: 67 mg/dL — ABNORMAL HIGH (ref 8–23)
CO2: 21 mmol/L — ABNORMAL LOW (ref 22–32)
Calcium: 9.2 mg/dL (ref 8.9–10.3)
Chloride: 128 mmol/L — ABNORMAL HIGH (ref 98–111)
Creatinine, Ser: 3.31 mg/dL — ABNORMAL HIGH (ref 0.61–1.24)
GFR, Estimated: 18 mL/min — ABNORMAL LOW (ref >60)
Glucose, Bld: 113 mg/dL — ABNORMAL HIGH (ref 70–99)
Potassium: 3.8 mmol/L (ref 3.5–5.1)
Sodium: 161 mmol/L (ref 135–145)
Total Bilirubin: 1.9 mg/dL — ABNORMAL HIGH (ref 0.3–1.2)
Total Protein: 7.1 g/dL (ref 6.5–8.1)

## 2022-04-19 LAB — LIPASE, BLOOD: Lipase: 51 U/L (ref 11–51)

## 2022-04-19 MED ORDER — ACETAMINOPHEN 325 MG PO TABS: 650.0000 mg | ORAL_TABLET | Freq: Four times a day (QID) | ORAL | Status: DC | PRN

## 2022-04-19 MED ORDER — LACTATED RINGERS IV BOLUS
500.0000 mL | Freq: Once | INTRAVENOUS | Status: AC
  Administered 2022-04-19: 500 mL via INTRAVENOUS

## 2022-04-19 MED ORDER — SODIUM CHLORIDE 0.9 % IV SOLN: 200.0000 mg | Freq: Once | INTRAVENOUS | Status: DC

## 2022-04-19 MED ORDER — SODIUM CHLORIDE 0.9 % IV SOLN
100.0000 mg | Freq: Every day | INTRAVENOUS | Status: AC
  Administered 2022-04-20 – 2022-04-21 (×2): 100 mg via INTRAVENOUS
  Filled 2022-04-19: qty 20
  Filled 2022-04-19: qty 100

## 2022-04-19 MED ORDER — QUETIAPINE FUMARATE 25 MG PO TABS: 50.0000 mg | ORAL_TABLET | Freq: Three times a day (TID) | ORAL | Status: DC | PRN

## 2022-04-19 MED ORDER — MEGESTROL ACETATE 400 MG/10ML PO SUSP
400.0000 mg | Freq: Two times a day (BID) | ORAL | Status: DC
  Administered 2022-04-23 (×2): 400 mg via ORAL
  Filled 2022-04-19 (×2): qty 10

## 2022-04-19 MED ORDER — SODIUM CHLORIDE 0.9 % IV SOLN: 100.0000 mg | Freq: Every day | INTRAVENOUS | Status: DC

## 2022-04-19 MED ORDER — METOPROLOL TARTRATE 25 MG PO TABS
12.5000 mg | ORAL_TABLET | Freq: Two times a day (BID) | ORAL | Status: DC
  Administered 2022-04-23: 12.5 mg via ORAL
  Filled 2022-04-19 (×2): qty 1

## 2022-04-19 MED ORDER — LACTATED RINGERS IV SOLN: INTRAVENOUS | Status: DC

## 2022-04-19 MED ORDER — APIXABAN 2.5 MG PO TABS
2.5000 mg | ORAL_TABLET | Freq: Two times a day (BID) | ORAL | Status: DC
  Administered 2022-04-23 (×2): 2.5 mg via ORAL
  Filled 2022-04-19 (×2): qty 1

## 2022-04-19 MED ORDER — ONDANSETRON HCL 4 MG/2ML IJ SOLN: 4.0000 mg | Freq: Four times a day (QID) | INTRAMUSCULAR | Status: DC | PRN

## 2022-04-19 MED ORDER — ACETAMINOPHEN 650 MG RE SUPP: 650.0000 mg | Freq: Four times a day (QID) | RECTAL | Status: DC | PRN

## 2022-04-19 MED ORDER — ONDANSETRON HCL 4 MG PO TABS: 4.0000 mg | ORAL_TABLET | Freq: Four times a day (QID) | ORAL | Status: DC | PRN

## 2022-04-19 MED ORDER — TAMSULOSIN HCL 0.4 MG PO CAPS
0.4000 mg | ORAL_CAPSULE | Freq: Every day | ORAL | Status: DC
  Filled 2022-04-19 (×2): qty 1

## 2022-04-19 MED ORDER — MIRTAZAPINE 15 MG PO TABS
7.5000 mg | ORAL_TABLET | Freq: Every day | ORAL | Status: DC
  Administered 2022-04-23: 7.5 mg via ORAL
  Filled 2022-04-19: qty 1

## 2022-04-19 MED ORDER — SODIUM CHLORIDE 0.9 % IV SOLN
100.0000 mg | INTRAVENOUS | Status: AC
  Administered 2022-04-19: 100 mg via INTRAVENOUS
  Filled 2022-04-19 (×2): qty 20

## 2022-04-19 NOTE — Assessment & Plan Note (Signed)
-   RD consult and SLP (was previously on dys 3 diet) - continue NPO until evaluated - continue IVF
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
## 2022-04-19 NOTE — Assessment & Plan Note (Signed)
-   I suspect this is contributing to his overall failure to thrive and deconditioning but not fully the culprit given his severe cachectic and malnourished state preceding this COVID infection - Nevertheless, for doing due diligence to patient and CT value 24.3 and poor dependability on his oral intake, for now will start on remdesivir - Still strongly recommend palliative care involvement for [place] discussions with family

## 2022-04-19 NOTE — ED Triage Notes (Signed)
Pt brought in by [facility] for c/o ams and pt was diagnosed with covid on 12/18

## 2022-04-19 NOTE — Progress Notes (Signed)
SLP Cancellation Note  Patient Details Name: Dominic Smith MRN: [id number] DOB: 01-31-1941   Cancelled treatment:       Reason Eval/Treat Not Completed: Patient's level of consciousness; BSE requested, however RN reports that Pt is not alert/appropriate for BSE at this time. SLP will check back. He was recently seen for BSE during his last admission a few weeks ago.  Thank you,  Havery Moros, CCC-SLP [phone]    Maleia Weems 04/19/2022, 6:20 PM

## 2022-04-19 NOTE — Assessment & Plan Note (Signed)
-  patient has history of CKD3b. Baseline creat ~ 1.8 - 1.9, eGFR 33 - patient presents with increase in creat >0.3 mg/dL above baseline, creat increase >1.5x baseline presumed to have occurred within past 7 days PTA - suspected pre-renal from severe poor intake; at risk for ATN with prolonged prerenal - creat 3.31 on admission - check FeNa - check renal u/s - continue foley; recently seen by urology on 04/11/2022 with ongoing urinary retention and failed TOV; I suspect he will not truly ever pass with severity of his TBI and level of deconditioning/functional decline
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
## 2022-04-19 NOTE — Assessment & Plan Note (Addendum)
-   suspect hypovolemic - check serum osmo - continue LR - trend BMP - follow up FeNa; see AoCKD too - speaks to poor prognosis given recurrence and overall decline
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
## 2022-04-19 NOTE — Assessment & Plan Note (Signed)
-   continues with ongoing retention -Recent urology evaluation on 04/11/2022 and failed TOV - Continue Foley

## 2022-04-19 NOTE — ED Notes (Signed)
Report given to Nate RN in ICU
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
## 2022-04-20 LAB — CBC WITH DIFFERENTIAL/PLATELET
Abs Immature Granulocytes: 0.05 10*3/uL (ref 0.00–0.07)
Basophils Absolute: 0 10*3/uL (ref 0.0–0.1)
Basophils Relative: 0 %
Eosinophils Absolute: 0.1 10*3/uL (ref 0.0–0.5)
Eosinophils Relative: 1 %
HCT: 43.2 % (ref 39.0–52.0)
Hemoglobin: 13.6 g/dL (ref 13.0–17.0)
Immature Granulocytes: 1 %
Lymphocytes Relative: 17 %
Lymphs Abs: 1.8 10*3/uL (ref 0.7–4.0)
MCH: 28.3 pg (ref 26.0–34.0)
MCHC: 31.5 g/dL (ref 30.0–36.0)
MCV: 90 fL (ref 80.0–100.0)
Monocytes Absolute: 0.5 10*3/uL (ref 0.1–1.0)
Monocytes Relative: 5 %
Neutro Abs: 8 10*3/uL — ABNORMAL HIGH (ref 1.7–7.7)
Neutrophils Relative %: 76 %
Platelets: 236 10*3/uL (ref 150–400)
RBC: 4.8 MIL/uL (ref 4.22–5.81)
RDW: 19.6 % — ABNORMAL HIGH (ref 11.5–15.5)
WBC: 10.4 10*3/uL (ref 4.0–10.5)
nRBC: 0 % (ref 0.0–0.2)

## 2022-04-20 LAB — SODIUM, URINE, RANDOM: Sodium, Ur: 17 mmol/L

## 2022-04-20 LAB — MRSA NEXT GEN BY PCR, NASAL: MRSA by PCR Next Gen: DETECTED — AB

## 2022-04-20 LAB — OSMOLALITY: Osmolality: 373 mOsm/kg (ref 275–295)

## 2022-04-20 LAB — MAGNESIUM: Magnesium: 2.6 mg/dL — ABNORMAL HIGH (ref 1.7–2.4)

## 2022-04-20 LAB — CREATININE, URINE, RANDOM: Creatinine, Urine: 289.05 mg/dL

## 2022-04-20 MED ORDER — CHLORHEXIDINE GLUCONATE CLOTH 2 % EX PADS
6.0000 | MEDICATED_PAD | Freq: Every day | CUTANEOUS | Status: DC
  Administered 2022-04-20 – 2022-04-28 (×9): 6 via TOPICAL

## 2022-04-20 MED ORDER — MUPIROCIN 2 % EX OINT
TOPICAL_OINTMENT | Freq: Two times a day (BID) | CUTANEOUS | Status: DC
  Filled 2022-04-20 (×3): qty 22
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
## 2022-04-20 NOTE — Evaluation (Signed)
Clinical/Bedside Swallow Evaluation Patient Details  Name: Kaisyn Millea MRN: [id number] Date of Birth: 1941/03/12  Today's Date: 04/20/2022 Time: SLP Start Time (ACUTE ONLY): 1345 SLP Stop Time (ACUTE ONLY): 1422 SLP Time Calculation (min) (ACUTE ONLY): 37 min  Past Medical History:  Past Medical History:  Diagnosis Date   Aortic valve stenosis    Cachexia (HCC)    Cervical spine fracture (HCC) 01/10/2022   Dehydration    TBI (traumatic brain injury) (HCC) 01/10/2022   Urinary retention    Past Surgical History: History reviewed. No pertinent surgical history. HPI:  Mr. 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  Of note patient was recently admitted to hospital between  12/8 - 12/12 for sepsis due to UTI (Kleb), hypernatremia, encephalopathy and was discharged back to SNF.   Patient again presented on 12/26 with recurrent (and even worse) hypernatremia and worsened renal function. Reportedly tested positive for covid 12/18, cannot tell if he was treated.  In the ED patient was severely cachectic with unintelligible speech was not able to answer questions or follow commands.  Initial sodium level was 161 with creatinine of 3.3 from baseline 1.3 on 04/05/2022.  WBC was elevated at 11.4.  COVID was positive with CT value of 24.3 patient was then admitted hospital for resuscitation, goals of care discussion. BSE requested. Pt was seen by SLP during last admission with recommendation for D3/mech soft and thin liquids.    Assessment / Plan / Recommendation  Clinical Impression  Clinical swallow evaluation completed with Pt lying on right side despite repositioning. Pt smiled and greeted SLP verbally after SLP greeted  Pt. He is confused but agreeable for the most part, some difficulty following directions for oral care and oral motor examination. Pt with dry oral mucosa and reduced labial seal. Pt appeared to perk up to music played [employer] and [employer]). He was presented with ice chips and manipulated and swallowed. Pt initially unable to suck from the straw and spoon presentations of thin and NTL resulted in labial spillage. SLP provded finger occluded straw sips of thin and NTL and once Pt demonstrated sucking from the straw at the end, he was then able to suck from the straw when placed in liquid. Pt with immediate and prolonged coughing after thin water, however coughing diminished with repeated presentations. No coughing with NTL, puree, and ice cream. Pt presents with cognitive based dysphagia, which increases his risk for aspiration and inability to meet nutritional needs. Best performance was when Pt presented with ice cream, as the bolus held together orally for presentation and then he manipulated and swallowed. Pt is unlikely to meet nutritional needs by mouth at this time due to AMS. Acknowledge that palliative consult is pending. Will initiate D1/puree and NTL and please provided Magic Cups on tray with 100% feeder assist, however suspect intake will be limited due to variable level of alertness. PO medications crushed as able in puree and OK for single ice chips after oral care and not during meals for comfort ok. SLP Visit Diagnosis: Dysphagia, unspecified (R13.10)    Aspiration Risk  Mild aspiration risk;Risk for inadequate nutrition/hydration    Diet Recommendation Dysphagia 1 (Puree);Nectar-thick liquid   Liquid Administration via: Spoon;Cup;Straw Medication Administration: Crushed with puree Supervision: Staff to assist with  self feeding;Full supervision/cueing for compensatory strategies Compensations: Minimize environmental distractions;Slow rate;Small sips/bites Postural Changes:  Seated upright at 90 degrees;Remain upright for at least 30 minutes after po intake    Other  Recommendations Oral Care Recommendations: Oral care BID;Oral care prior to ice chip/H20;Staff/trained caregiver to provide oral care Other Recommendations: Order thickener from pharmacy;Prohibited food (jello, ice cream, thin soups);Clarify dietary restrictions    Recommendations for follow up therapy are one component of a multi-disciplinary discharge planning process, led by the attending physician.  Recommendations may be updated based on patient status, additional functional criteria and insurance authorization.  Follow up Recommendations Skilled nursing-short term rehab (<3 hours/day)      Assistance Recommended at Discharge    Functional Status Assessment Patient has had a recent decline in their functional status and demonstrates the ability to make significant improvements in function in a reasonable and predictable amount of time.  Frequency and Duration min 2x/week  1 week       Prognosis Prognosis for Safe Diet Advancement: Fair Barriers to Reach Goals: Cognitive deficits      Swallow Study   General Date of Onset: 04/19/22 HPI: Mr. 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  Of note patient was recently admitted to hospital between  12/8 - 12/12 for sepsis due to UTI (Kleb), hypernatremia, encephalopathy and was discharged back to SNF.   Patient again presented on 12/26 with recurrent (and even worse) hypernatremia and worsened renal function. Reportedly tested positive for covid 12/18, cannot tell if he was treated.  In the ED patient was severely cachectic with unintelligible speech was not able to answer  questions or follow commands.  Initial sodium level was 161 with creatinine of 3.3 from baseline 1.3 on 04/05/2022.  WBC was elevated at 11.4.  COVID was positive with CT value of 24.3 patient was then admitted hospital for resuscitation, goals of care discussion. BSE requested. Pt was seen by SLP during last admission with recommendation for D3/mech soft and thin liquids. Type of Study: Bedside Swallow Evaluation Previous Swallow Assessment: BSE and MBSS 10/23 s/p TBI and prolonged hospitalization at [facility]. Recommendation for D3/thin and BSE 12/23 D3/thin Diet Prior to this Study: NPO Temperature Spikes Noted: No Respiratory Status: Room air History of Recent Intubation: No Behavior/Cognition: Cooperative;Alert;Confused Oral Cavity Assessment: Dried secretions Oral Care Completed by SLP: Yes Oral Cavity - Dentition: Poor condition Vision: Impaired for self-feeding Self-Feeding Abilities: Total assist Patient Positioning: Upright in bed (leans to his right despite repositioning) Baseline Vocal Quality: Normal;Low vocal intensity Volitional Cough: Weak Volitional Swallow: Unable to elicit    Oral/Motor/Sensory Function Overall Oral Motor/Sensory Function: Generalized oral weakness   Ice Chips Ice chips: Within functional limits Presentation: Spoon   Thin Liquid Thin Liquid: Impaired Presentation: Spoon;Straw Oral Phase Impairments: Reduced labial seal Oral Phase Functional Implications:  (suspect premature spillage) Pharyngeal  Phase Impairments: Suspected delayed Swallow;Cough - Immediate    Nectar Thick Nectar Thick Liquid: Impaired Presentation: Spoon;Straw Oral Phase Impairments: Reduced labial seal Pharyngeal Phase Impairments: Suspected delayed Swallow   Honey Thick Honey Thick Liquid: Not tested   Puree Puree: Impaired Presentation: Spoon Oral Phase Impairments: Reduced lingual movement/coordination Oral Phase Functional Implications: Prolonged oral transit   Solid      Solid: Not tested  Thank you,  Havery Moros, CCC-SLP [phone]  Kirti Carl 04/20/2022,2:42 PM

## 2022-04-20 NOTE — Progress Notes (Addendum)
Initial Nutrition Assessment  DOCUMENTATION CODES:   Severe malnutrition in context of chronic illness  Underweight  INTERVENTION:  Follow outcome of SLP evaluation and Palliative GOC discussion    NUTRITION DIAGNOSIS:   Severe Malnutrition related to chronic illness as evidenced by severe muscle depletion, severe fat depletion, weight loss 326% x 3 months.    GOAL:   (Palliative to discuss GOC- will make further recommedations once patient heathcare wishes are established)   MONITOR:  Diet advancement, Supplement acceptance, Weight trends, Labs, Skin  REASON FOR ASSESSMENT:   Consult Assessment of nutrition requirement/status  ASSESSMENT: Patient is an underweight 81 yo with hx of TBI, Cachexia, CKD3, severe PCM who presents with weakness, acute renal failure, hypernatremia. Recent COVID and sepsis due to UTI. Palliative consulted for GOC discussion.   Patient NPO pending SLP evaluation. Patient unable to provide nutrition history. Dehydrated on admission. Talked with nursing. Weight history reviewed. Severe weight loss 36% x 3 months.   Medications: remeron, megace.      Latest Ref Rng & Units 04/19/2022   12:53 PM 04/05/2022    3:36 AM 04/04/2022   10:06 AM  BMP  Glucose 70 - 99 mg/dL 759  85    BUN 8 - 23 mg/dL 67  25    Creatinine 1.63 - 1.24 mg/dL 8.46  6.59    Sodium 935 - 145 mmol/L 161  145  145   Potassium 3.5 - 5.1 mmol/L 3.8  3.4    Chloride 98 - 111 mmol/L 128  119    CO2 22 - 32 mmol/L 21  21    Calcium 8.9 - 10.3 mg/dL 9.2  8.5        NUTRITION - FOCUSED PHYSICAL EXAM:  Flowsheet Row Most Recent Value  Orbital Region Severe depletion  Upper Arm Region Severe depletion  Thoracic and Lumbar Region Severe depletion  Buccal Region Severe depletion  Temple Region Moderate depletion  Clavicle Bone Region Severe depletion  Clavicle and Acromion Bone Region Severe depletion  Scapular Bone Region Moderate depletion  Dorsal Hand Unable to assess   [green mitts]  Patellar Region Severe depletion  Anterior Thigh Region Severe depletion  Posterior Calf Region Severe depletion  Edema (RD Assessment) None  Hair Reviewed  Eyes Unable to assess  Mouth Reviewed  Skin Reviewed  Nails Unable to assess       Diet Order:   Diet Order     None       EDUCATION NEEDS:  Not appropriate for education at this time  Skin:  Skin Assessment: Reviewed RN Assessment  Last BM:  unknown  Height:   Ht Readings from Last 1 Encounters:  04/19/22 5\' 11"  (1.803 m)    Weight:   Wt Readings from Last 1 Encounters:  04/20/22 44.9 kg    Ideal Body Weight:   78 kg  BMI:  Body mass index is 13.81 kg/m.  Estimated Nutritional Needs:   Kcal:  1800-1900  Protein:  88-93 gr  Fluid:  >1500 ml daily  04/22/22 MS,RD,CSG,LDN Contact: Royann Shivers

## 2022-04-21 DIAGNOSIS — Z515 Encounter for palliative care: Secondary | ICD-10-CM

## 2022-04-21 DIAGNOSIS — Z7189 Other specified counseling: Secondary | ICD-10-CM

## 2022-04-21 LAB — BASIC METABOLIC PANEL
BUN: 61 mg/dL — ABNORMAL HIGH (ref 8–23)
CO2: 24 mmol/L (ref 22–32)
Calcium: 8.8 mg/dL — ABNORMAL LOW (ref 8.9–10.3)
Chloride: >130 mmol/L (ref 98–111)
Creatinine, Ser: 2.58 mg/dL — ABNORMAL HIGH (ref 0.61–1.24)
GFR, Estimated: 24 mL/min — ABNORMAL LOW (ref >60)
Glucose, Bld: 84 mg/dL (ref 70–99)
Potassium: 3.3 mmol/L — ABNORMAL LOW (ref 3.5–5.1)
Sodium: 164 mmol/L (ref 135–145)

## 2022-04-21 LAB — CBC WITH DIFFERENTIAL/PLATELET
Abs Immature Granulocytes: 0.07 10*3/uL (ref 0.00–0.07)
Basophils Absolute: 0 10*3/uL (ref 0.0–0.1)
Basophils Relative: 0 %
Eosinophils Absolute: 0.2 10*3/uL (ref 0.0–0.5)
Eosinophils Relative: 2 %
HCT: 37.3 % — ABNORMAL LOW (ref 39.0–52.0)
Hemoglobin: 11.3 g/dL — ABNORMAL LOW (ref 13.0–17.0)
Immature Granulocytes: 1 %
Lymphocytes Relative: 9 %
Lymphs Abs: 1 10*3/uL (ref 0.7–4.0)
MCH: 27.4 pg (ref 26.0–34.0)
MCHC: 30.3 g/dL (ref 30.0–36.0)
MCV: 90.5 fL (ref 80.0–100.0)
Monocytes Absolute: 0.5 10*3/uL (ref 0.1–1.0)
Monocytes Relative: 4 %
Neutro Abs: 9.2 10*3/uL — ABNORMAL HIGH (ref 1.7–7.7)
Neutrophils Relative %: 84 %
Platelets: 185 10*3/uL (ref 150–400)
RBC: 4.12 MIL/uL — ABNORMAL LOW (ref 4.22–5.81)
RDW: 19 % — ABNORMAL HIGH (ref 11.5–15.5)
WBC: 11 10*3/uL — ABNORMAL HIGH (ref 4.0–10.5)
nRBC: 0 % (ref 0.0–0.2)

## 2022-04-21 LAB — MAGNESIUM: Magnesium: 2.4 mg/dL (ref 1.7–2.4)

## 2022-04-21 MED ORDER — DEXTROSE 5 % IV SOLN: INTRAVENOUS | Status: DC

## 2022-04-21 NOTE — Progress Notes (Signed)
Palliative: Thank you for this consult.  Unfortunately, due to illness/staffing issues there will be a delay in a Palliative Provider seeing this patient. Palliative Medicine will return to service on 04/26/2022 and will see patient at that time, if patient is still hospitalized.  No charge  Macallan Ord, NP Palliative Medicine Please call Palliative Medicine team phone with any questions [phone]. For individual providers please see AMION. 

## 2022-04-21 NOTE — Progress Notes (Signed)
PROGRESS NOTE    Dominic Smith  ION:[id number] DOB: 10/11/1940 DOA: 04/19/2022 PCP: Sherol Dade, DO    Brief Narrative:  Mr. 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  Of note patient was recently admitted to hospital between  12/8 - 12/12 for sepsis due to UTI (Kleb), hypernatremia, encephalopathy and was discharged back to SNF.  Patient again presented on 12/26 with recurrent (and even worse) hypernatremia and worsened renal function. Reportedly tested positive for covid 12/18, cannot tell if he was treated.  In the ED, patient was severely cachectic with unintelligible speech was not able to answer questions or follow commands.  Initial sodium level was 161 with creatinine of 3.3 from baseline 1.3 on 04/05/2022.  WBC was elevated at 11.4.  COVID was positive with CT value of 24.3 patient was then admitted hospital for resuscitation, goals of care discussion.   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  Baseline creatinine around 1.8-1.9.  Presenting creatinine of 3.3 from initial presentation.  Likely poor oral intake. On Foley catheter for history of urinary retention.  Urinary sodium was low at 17.  Urinalysis shows more than 50 white cells.  Renal ultrasound did not show any acute findings.  Latest creatinine of 2.5 at this time.  Hypernatremia Likely hypovolemic.  Serum osmolality was 373.  Continue IV fluid hydration with D5 water.Marland Kitchen  Poor prognosis.  Recurrence of hypernatremia at this time.  Latest sodium level of 164 higher  from 161 despite D5 water.  TBI (traumatic brain injury, history of SAH and IVH with  multiple traumas. Palliative care has been consulted for goals of care discussion.  Have spoken with the ex-spouse.  Hospice evaluation pending at this time.  COVID-19 virus infection Likely contributing to worsening condition but has baseline failure to thrive.  Received remdesivir  Urinary retention -Recent urology evaluation on 04/11/2022 and failed TOV.  Continue Foley catheter.  Protein-calorie malnutrition, severe Nutrition and speech therapy consulted.  Previously was on dysphagia 3 diet.  Continue IV fluids with Ringer lactate at 125 ml per hour.  Currently NPO.  Deconditioning, debility, ongoing decline.  Spoke with the patient's ex-spouse who is the decision maker for the patient.  She expresses her desire for him to be evaluated by hospice.  Poor prognosis.   DVT prophylaxis: apixaban (ELIQUIS) tablet 2.5 mg Start: 04/19/22 2200 apixaban (ELIQUIS) tablet 2.5 mg   Code Status:     Code Status: DNR  Disposition: Patient is from skilled nursing facility, plan for hospice on discharge.  Status is: Inpatient  Remains inpatient appropriate because: Multiple comorbidities, hypernatremia, renal failure, COVID-19 infection, hospice evaluation.   Family Communication:  I spoke with the patient's ex spouse on the phone with the decision making for the patient.  She has a strongly wished for hospice level of care.  Palliative care has been consulted pending evaluation.  Spoke with TOC again for hospice referral.  Patient might qualify for residential hospice.  Consultants:  Palliative care  Procedures:  None  Antimicrobials:  Remdesivir-completed  Anti-infectives (From admission, onward)    Start     Dose/Rate Route Frequency Ordered Stop   04/20/22 1000  remdesivir 100 mg in sodium chloride 0.9 %  100 mL IVPB  Status:  Discontinued       See Hyperspace for full Linked Orders Report.   100 mg 200 mL/hr over 30 Minutes Intravenous Daily 04/19/22 1449 04/19/22 1510    04/20/22 1000  remdesivir 100 mg in sodium chloride 0.9 % 100 mL IVPB       See Hyperspace for full Linked Orders Report.   100 mg 200 mL/hr over 30 Minutes Intravenous Daily 04/19/22 1510 04/21/22 1000   04/19/22 1530  remdesivir 100 mg in sodium chloride 0.9 % 100 mL IVPB       See Hyperspace for full Linked Orders Report.   100 mg 200 mL/hr over 30 Minutes Intravenous Every 30 min 04/19/22 1510 04/19/22 1629   04/19/22 1500  remdesivir 200 mg in sodium chloride 0.9% 250 mL IVPB  Status:  Discontinued       See Hyperspace for full Linked Orders Report.   200 mg 580 mL/hr over 30 Minutes Intravenous Once 04/19/22 1449 04/19/22 1510       Subjective: Today, patient was seen and examined at bedside.  Patient continues to moan and groan without any meaningful interaction.  Cachectic and debilitated male.    Objective: Vitals:   04/21/22 0600 04/21/22 0758 04/21/22 0800 04/21/22 1110  BP: (!) 129/49  139/64   Pulse: 92  86   Resp: 19  20   Temp:  (!) 97.3 F (36.3 C)  99.1 F (37.3 C)  TempSrc:  Axillary  Axillary  SpO2: 100%  100%   Weight:      Height:        Intake/Output Summary (Last 24 hours) at 04/21/2022 1408 Last data filed at 04/21/2022 0400 Gross per 24 hour  Intake --  Output 225 ml  Net -225 ml    Filed Weights   04/19/22 1150 04/19/22 1726 04/20/22 0500  Weight: 54.4 kg 44.7 kg 44.9 kg    Physical Examination: Body mass index is 13.81 kg/m.   General: Cachectic male, chronically ill and deconditioned and debilitated, only able to moan, HENT:   No scleral pallor or icterus noted. Oral mucosa is dry Chest:  Clear breath sounds.  Diminished breath sounds bilaterally.  No crackles or wheezes.  CVS: S1 &S2 heard. No murmur.  Regular rate and rhythm. Abdomen: Soft, nontender, nondistended.  Bowel sounds are heard.  Foley catheter in place Extremities: No cyanosis, clubbing or edema.  Peripheral pulses are palpable. Psych: Awake but moaning. CNS:    Generalized weakness noted Skin: Warm and dry.  No rashes noted.  Data Reviewed:   CBC: Recent Labs  Lab 04/19/22 1253 04/20/22 0429 04/21/22 0453  WBC 11.4* 10.4 11.0*  NEUTROABS 9.1* 8.0* 9.2*  HGB 14.7 13.6 11.3*  HCT 47.4 43.2 37.3*  MCV 89.3 90.0 90.5  PLT 323 236 185     Basic Metabolic Panel: Recent Labs  Lab 04/19/22 1253 04/20/22 0429 04/21/22 0453  NA 161*  --  164*  K 3.8  --  3.3*  CL 128*  --  >130*  CO2 21*  --  24  GLUCOSE 113*  --  84  BUN 67*  --  61*  CREATININE 3.31*  --  2.58*  CALCIUM 9.2  --  8.8*  MG  --  2.6* 2.4     Liver Function Tests: Recent Labs  Lab 04/19/22 1253  AST 23  ALT 27  ALKPHOS 88  BILITOT 1.9*  PROT 7.1  ALBUMIN 3.1*  Radiology Studies: US RENAL  Result Date: 04/19/2022 CLINICAL DATA:  Acute renal injury EXAM: RENAL / URINARY TRACT ULTRASOUND COMPLETE COMPARISON:  05/14/2021 FINDINGS: Right Kidney: Renal measurements: 9.1 x 3.9 x 4.6 cm. = volume: 85.4 mL. Echogenicity within normal limits. No mass or hydronephrosis visualized. Left Kidney: Renal measurements: 9.1 x 4.9 x 4.7 cm. = volume: 108 mL. Echogenicity within normal limits. No mass or hydronephrosis visualized. Bladder: Not well visualized Other: None. IMPRESSION: No acute abnormality noted. Electronically Signed   By: Inez Catalina M.D.   On: 04/19/2022 15:35   CT HEAD WO CONTRAST (5MM)  Result Date: 04/19/2022 CLINICAL DATA:  History of COVID and altered mental status EXAM: CT HEAD WITHOUT CONTRAST TECHNIQUE: Contiguous axial images were obtained from the base of the skull through the vertex without intravenous contrast. RADIATION DOSE REDUCTION: This exam was performed according to the departmental dose-optimization program which includes automated exposure control, adjustment of the mA and/or kV according to patient size and/or use of iterative reconstruction technique. COMPARISON:  04/01/2022 FINDINGS: Brain: No evidence of acute infarction,  hemorrhage, hydrocephalus, extra-axial collection or mass lesion/mass effect. Chronic atrophic and ischemic changes are noted. Vascular: No hyperdense vessel or unexpected calcification. Skull: Normal. Negative for fracture or focal lesion. Sinuses/Orbits: Persistent thickening of the maxillary antrum wall is noted as well as similar findings involving the sphenoid and left frontal sinus stable from the prior exam. Other: None IMPRESSION: Chronic atrophic and ischemic changes without acute intracranial abnormality. Changes suggestive of chronic sinusitis. Electronically Signed   By: Inez Catalina M.D.   On: 04/19/2022 15:22      LOS: 2 days    Flora Lipps, MD [facility] Available via Epic secure chat 7am-7pm After these hours, please refer to coverage provider listed on [email or web address] 04/21/2022, 2:08 PM

## 2022-04-21 NOTE — TOC Initial Note (Signed)
Transition of Care [facility]) - Initial/Assessment Note    Patient Details  Name: Dominic Smith MRN: [id number] Date of Birth: 22-Feb-1941  [facility]) CM/SW Contact:    Annice Needy, LCSW Phone Number: 04/21/2022, 4:53 PM  Clinical Narrative:                 Patient has been at [facility] for two months. TOC consulted for Hospice referral. Son agreeable to [facility]. Referral made.   Expected Discharge Plan: Hospice Medical Facility     Patient Goals and CMS Choice            Expected Discharge Plan and Services                                              Prior Living Arrangements/Services                       Activities of Daily Living Home Assistive Devices/Equipment: None ADL Screening (condition at time of admission) Patient's cognitive ability adequate to safely complete daily activities?: No Is the patient deaf or have difficulty hearing?: No Does the patient have difficulty seeing, even when wearing glasses/contacts?: No Does the patient have difficulty concentrating, remembering, or making decisions?: Yes Patient able to express need for assistance with ADLs?: No Does the patient have difficulty dressing or bathing?: Yes Independently performs ADLs?: No Does the patient have difficulty walking or climbing stairs?: Yes Weakness of Legs: Both Weakness of Arms/Hands: Both  Permission Sought/Granted                  Emotional Assessment              Admission diagnosis:  Hypernatremia [E87.0] AKI (acute kidney injury) (HCC) [N17.9] Patient Active Problem List   Diagnosis Date Noted   Hypernatremia 04/19/2022   COVID-19 virus infection 04/19/2022   Sepsis (HCC) 04/01/2022   Closed fracture of head of right humerus    Azotemia    Urinary retention    Hypervolemia    Protein-calorie malnutrition, severe 01/19/2022   TBI (traumatic brain injury) (HCC) 01/19/2022   HCAP (healthcare-associated  pneumonia) 01/19/2022   Rib fracture 01/10/2022   Acute cystitis with hematuria    Acute renal failure superimposed on stage 3b chronic kidney disease (HCC) 05/15/2021   Acute lower UTI 05/15/2021   Dehydration 05/15/2021   Heart murmur 05/15/2021   Mild aortic valve stenosis: Mild to moderate aortic valve stenosis per 2D echo 05/15/2021 05/15/2021   Acute metabolic encephalopathy 05/14/2021   PCP:  Sherol Dade, DO Pharmacy:   [facility] [address] [place] [address] Phone: [phone] Fax: [phone]  [facility] [address] [place] [address] Phone: [phone] Fax: [phone]  [facility] [place], [place] - [address] [place] [address] Phone: [phone] Fax: [phone]     Social Determinants of Health (SDOH) Social History: SDOH Screenings   Housing: Low Risk  (01/15/2022)  Tobacco Use: Low Risk  (04/19/2022)   SDOH Interventions:     Readmission Risk Interventions    04/04/2022    1:40 PM  Readmission Risk Prevention Plan  Transportation Screening Complete  HRI or Home Care Consult Complete  Social Work Consult for Recovery Care Planning/Counseling  Complete  Palliative Care Screening Not Applicable  Medication Review (RN Care Manager) Complete

## 2022-04-21 NOTE — Progress Notes (Signed)
Date and time results received: 04/21/22 0551   Test: Na+ 164  Cl- 135 Critical Value: Na+ 164  Cl- 135  Name of Provider Notified: Carollee Herter DO  Orders Received? Or Actions Taken?: Stop LR fluids and Start D5.   Primary RN Charlotte Sanes is aware.   Karl Ito, RN

## 2022-04-22 LAB — CBC WITH DIFFERENTIAL/PLATELET
Abs Immature Granulocytes: 0.07 10*3/uL (ref 0.00–0.07)
Basophils Absolute: 0 10*3/uL (ref 0.0–0.1)
Basophils Relative: 0 %
Eosinophils Absolute: 0.4 10*3/uL (ref 0.0–0.5)
Eosinophils Relative: 3 %
HCT: 36.7 % — ABNORMAL LOW (ref 39.0–52.0)
Hemoglobin: 11.4 g/dL — ABNORMAL LOW (ref 13.0–17.0)
Immature Granulocytes: 1 %
Lymphocytes Relative: 10 %
Lymphs Abs: 1.3 10*3/uL (ref 0.7–4.0)
MCH: 27.9 pg (ref 26.0–34.0)
MCHC: 31.1 g/dL (ref 30.0–36.0)
MCV: 90 fL (ref 80.0–100.0)
Monocytes Absolute: 0.5 10*3/uL (ref 0.1–1.0)
Monocytes Relative: 4 %
Neutro Abs: 11.2 10*3/uL — ABNORMAL HIGH (ref 1.7–7.7)
Neutrophils Relative %: 82 %
Platelets: 179 10*3/uL (ref 150–400)
RBC: 4.08 MIL/uL — ABNORMAL LOW (ref 4.22–5.81)
RDW: 18.9 % — ABNORMAL HIGH (ref 11.5–15.5)
WBC: 13.5 10*3/uL — ABNORMAL HIGH (ref 4.0–10.5)
nRBC: 0 % (ref 0.0–0.2)

## 2022-04-22 LAB — BASIC METABOLIC PANEL
Anion gap: 2 — ABNORMAL LOW (ref 5–15)
BUN: 44 mg/dL — ABNORMAL HIGH (ref 8–23)
CO2: 23 mmol/L (ref 22–32)
Calcium: 8.3 mg/dL — ABNORMAL LOW (ref 8.9–10.3)
Chloride: 127 mmol/L — ABNORMAL HIGH (ref 98–111)
Creatinine, Ser: 2.1 mg/dL — ABNORMAL HIGH (ref 0.61–1.24)
GFR, Estimated: 31 mL/min — ABNORMAL LOW (ref >60)
Glucose, Bld: 110 mg/dL — ABNORMAL HIGH (ref 70–99)
Potassium: 3.2 mmol/L — ABNORMAL LOW (ref 3.5–5.1)
Sodium: 152 mmol/L — ABNORMAL HIGH (ref 135–145)

## 2022-04-22 LAB — GLUCOSE, CAPILLARY: Glucose-Capillary: 87 mg/dL (ref 70–99)

## 2022-04-22 LAB — CBC
HCT: 34.6 % — ABNORMAL LOW (ref 39.0–52.0)
Hemoglobin: 10.9 g/dL — ABNORMAL LOW (ref 13.0–17.0)
MCH: 28 pg (ref 26.0–34.0)
MCHC: 31.5 g/dL (ref 30.0–36.0)
MCV: 88.9 fL (ref 80.0–100.0)
Platelets: 181 10*3/uL (ref 150–400)
RBC: 3.89 MIL/uL — ABNORMAL LOW (ref 4.22–5.81)
RDW: 18.7 % — ABNORMAL HIGH (ref 11.5–15.5)
WBC: 11.4 10*3/uL — ABNORMAL HIGH (ref 4.0–10.5)
nRBC: 0 % (ref 0.0–0.2)

## 2022-04-22 LAB — MAGNESIUM: Magnesium: 2.3 mg/dL (ref 1.7–2.4)

## 2022-04-22 MED ORDER — POTASSIUM CL IN DEXTROSE 5% 20 MEQ/L IV SOLN
20.0000 meq | INTRAVENOUS | Status: DC
  Administered 2022-04-22 – 2022-04-28 (×13): 20 meq via INTRAVENOUS

## 2022-04-22 MED ORDER — POTASSIUM CHLORIDE CRYS ER 20 MEQ PO TBCR: 40.0000 meq | EXTENDED_RELEASE_TABLET | Freq: Once | ORAL | Status: DC

## 2022-04-22 NOTE — Progress Notes (Signed)
Palliative:  Notes and chart reviewed. Discussed briefly with RN. Hospice referral appropriate and I agree with transition to hospice facility. He is not eating and anticipate steady decline off dextrose infusion with prognosis days to weeks most likely.   No charge  Yong Channel, NP Palliative Medicine Team Pager [phone] (Please see [email or web address] for schedule) Team Phone [phone]

## 2022-04-22 NOTE — TOC Progression Note (Signed)
Transition of Care [facility]) - Progression Note    Patient Details  Name: Dominic Smith MRN: [id number] Date of Birth: [birth year]  Transition of Care [facility]) CM/SW Contact  Annice Needy, LCSW Phone Number: 04/22/2022, 2:34 PM  Clinical Narrative:    Per Amber with hospice, patient will have to wait 5 days, test negative and be asymptomatic before Hospice can do a virtual bed or admit to [facility]. Attending notified    Expected Discharge Plan: Hospice Medical Facility    Expected Discharge Plan and Services                                               Social Determinants of Health (SDOH) Interventions SDOH Screenings   Housing: Low Risk  (01/15/2022)  Tobacco Use: Low Risk  (04/19/2022)    Readmission Risk Interventions    04/04/2022    1:40 PM  Readmission Risk Prevention Plan  Transportation Screening Complete  HRI or Home Care Consult Complete  Social Work Consult for Recovery Care Planning/Counseling Complete  Palliative Care Screening Not Applicable  Medication Review Oceanographer) Complete

## 2022-04-22 NOTE — Progress Notes (Signed)
PROGRESS NOTE    Dominic Dominic  YKD:[id number] DOB: 1941/04/15 DOA: 04/19/2022 PCP: Dominic Dade, DO    Brief Narrative:  Mr. 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  Of note patient was recently admitted to hospital between  12/8 - 12/12 for sepsis due to UTI (Kleb), hypernatremia, encephalopathy and was discharged back to SNF.  Patient again presented on 12/26 with recurrent (and even worse) hypernatremia and worsened renal function. Reportedly tested positive for covid 12/18, cannot tell if he was treated.  In the ED, patient was severely cachectic with unintelligible speech was not able to answer questions or follow commands.  Initial sodium level was 161 with creatinine of 3.3 from baseline 1.3 on 04/05/2022.  WBC was elevated at 11.4.  COVID was positive with CT value of 24.3 patient was then admitted hospital for resuscitation, goals of care discussion.   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  Baseline creatinine around 1.8-1.9.  Presenting creatinine of 3.3 from initial presentation.  Likely poor oral intake.  Continue Foley catheter for history of urinary retention.  Urinary sodium was low at 17.  Urinalysis shows more than 50 white cells.  Renal ultrasound did not show any acute findings.  Hypernatremia Likely hypovolemic.  Serum osmolality was 373.  Marland Kitchen  Poor prognosis.  Recurrence of hypernatremia at this time.  Remains on D5 water.  Mild hypokalemia.  Will replenish with D5 water.  TBI (traumatic brain injury, history of SAH and IVH with multiple traumas. Palliative care has been consulted for goals of  care discussion.  Have spoken with the ex-spouse.  Patient has been considered at this time.  COVID-19 virus infection Likely contributing to worsening condition but has baseline failure to thrive. On remdesivir.  Urinary retention -Recent urology evaluation on 04/11/2022 and failed TOV.  Continue Foley catheter.  Protein-calorie malnutrition, severe Nutrition and speech therapy consulted.  Previously was on dysphagia 3 diet.  Continue IV fluids with fiber water and potassium.  On dysphagia 1 diet.  Deconditioning, debility, ongoing decline.  Plan for hospice on discharge   DVT prophylaxis: apixaban (ELIQUIS) tablet 2.5 mg Start: 04/19/22 2200 apixaban (ELIQUIS) tablet 2.5 mg   Code Status:     Code Status: DNR  Disposition: Hospice arrangement awaiting.  Will transfer to telemetry.  Status is: Inpatient  Remains inpatient appropriate because: Multiple comorbidities, hypernatremia, renal failure, COVID-19 infection, plan for hospice.   Family Communication:  I spoke with the patient's ex spouse on the phone on 04/20/2022.  Plan for hospice at this time.  Discussed at [facility] meeting.  Consultants:  Palliative care  Procedures:  None  Antimicrobials:  Remdesivir  Anti-infectives (From admission, onward)    Start     Dose/Rate Route Frequency Ordered Stop   04/20/22 1000  remdesivir 100 mg in sodium chloride 0.9 % 100 mL IVPB  Status:  Discontinued       See Hyperspace for full Linked Orders Report.   100 mg 200 mL/hr over 30 Minutes Intravenous Daily 04/19/22 1449 04/19/22 1510   04/20/22 1000  remdesivir 100 mg in sodium chloride 0.9 % 100 mL IVPB       See Hyperspace for full Linked Orders Report.  100 mg 200 mL/hr over 30 Minutes Intravenous Daily 04/19/22 1510 04/21/22 1030   04/19/22 1530  remdesivir 100 mg in sodium chloride 0.9 % 100 mL IVPB       See Hyperspace for full Linked Orders Report.   100 mg 200 mL/hr over 30 Minutes Intravenous Every 30 min 04/19/22  1510 04/19/22 1629   04/19/22 1500  remdesivir 200 mg in sodium chloride 0.9% 250 mL IVPB  Status:  Discontinued       See Hyperspace for full Linked Orders Report.   200 mg 580 mL/hr over 30 Minutes Intravenous Once 04/19/22 1449 04/19/22 1510       Subjective: Today, patient was seen and examined at bedside.  Appears to be more alert awake today but debilitated and deconditioned.  Unable to express much.    Objective: Vitals:   04/22/22 1000 04/22/22 1100 04/22/22 1200 04/22/22 1300  BP: (!) 155/73 (!) 158/63 (!) 155/59 (!) 155/78  Pulse: 68 69 61 85  Resp: 17 18 18 16   Temp:  98 F (36.7 C)    TempSrc:  Axillary    SpO2: 100% 100% 100% 100%  Weight:      Height:        Intake/Output Summary (Last 24 hours) at 04/22/2022 1344 Last data filed at 04/22/2022 0306 Gross per 24 hour  Intake 1774.27 ml  Output 900 ml  Net 874.27 ml    Filed Weights   04/19/22 1726 04/20/22 0500 04/22/22 0500  Weight: 44.7 kg 44.9 kg 46.8 kg    Physical Examination: Body mass index is 14.39 kg/m.  General: Cachectic male, chronically ill-appearing, deconditioned, more alert and awake today.  Appears to be mildly verbal. HENT:   No scleral pallor or icterus noted. Oral mucosa is dry Chest:  Clear breath sounds.  No crackles or wheezes.  CVS: S1 &S2 heard. No murmur.  Regular rate and rhythm. Abdomen: Soft, nontender, nondistended.  Bowel sounds are heard.  Foley catheter in place. Extremities: No cyanosis, clubbing or edema.  Peripheral pulses are palpable. Psych: More alert awake and mildly verbal.  Disoriented. CNS:   Moves all extremities, generalized weakness noted Skin: Warm and dry.  No rashes noted.  Data Reviewed:   CBC: Recent Labs  Lab 04/19/22 1253 04/20/22 0429 04/21/22 0453 04/22/22 0335  WBC 11.4* 10.4 11.0* 13.5*  NEUTROABS 9.1* 8.0* 9.2* 11.2*  HGB 14.7 13.6 11.3* 11.4*  HCT 47.4 43.2 37.3* 36.7*  MCV 89.3 90.0 90.5 90.0  PLT 323 236 185 179     Basic  Metabolic Panel: Recent Labs  Lab 04/19/22 1253 04/20/22 0429 04/21/22 0453 04/22/22 0335 04/22/22 1131  NA 161*  --  164*  --  152*  K 3.8  --  3.3*  --  3.2*  CL 128*  --  >130*  --  127*  CO2 21*  --  24  --  23  GLUCOSE 113*  --  84  --  110*  BUN 67*  --  61*  --  44*  CREATININE 3.31*  --  2.58*  --  2.10*  CALCIUM 9.2  --  8.8*  --  8.3*  MG  --  2.6* 2.4 2.3  --      Liver Function Tests: Recent Labs  Lab 04/19/22 1253  AST 23  ALT 27  ALKPHOS 88  BILITOT 1.9*  PROT 7.1  ALBUMIN 3.1*      Radiology Studies: No results found.    LOS: 3 days  Flora Lipps, MD [facility] Available via Epic secure chat 7am-7pm After these hours, please refer to coverage provider listed on [email or web address] 04/22/2022, 1:44 PM

## 2022-04-22 NOTE — Progress Notes (Signed)
SLP Cancellation Note  Patient Details Name: Dominic Smith MRN: [id number] DOB: May 16, 1940   Cancelled treatment:       Reason Eval/Treat Not Completed: Other (comment) (Note hospice referral. ST will sign off at this time. Thank you for this referral,)  Dominic Smith H. Dominic Smith, CCC-SLP Speech Language Pathologist    Dominic Smith 04/22/2022, 3:26 PM

## 2022-04-23 LAB — BASIC METABOLIC PANEL
Anion gap: 4 — ABNORMAL LOW (ref 5–15)
BUN: 34 mg/dL — ABNORMAL HIGH (ref 8–23)
CO2: 19 mmol/L — ABNORMAL LOW (ref 22–32)
Calcium: 8.3 mg/dL — ABNORMAL LOW (ref 8.9–10.3)
Chloride: 126 mmol/L — ABNORMAL HIGH (ref 98–111)
Creatinine, Ser: 1.68 mg/dL — ABNORMAL HIGH (ref 0.61–1.24)
GFR, Estimated: 40 mL/min — ABNORMAL LOW (ref >60)
Glucose, Bld: 105 mg/dL — ABNORMAL HIGH (ref 70–99)
Potassium: 3.2 mmol/L — ABNORMAL LOW (ref 3.5–5.1)
Sodium: 149 mmol/L — ABNORMAL HIGH (ref 135–145)

## 2022-04-23 LAB — CBC
HCT: 36.2 % — ABNORMAL LOW (ref 39.0–52.0)
Hemoglobin: 11.5 g/dL — ABNORMAL LOW (ref 13.0–17.0)
MCH: 28 pg (ref 26.0–34.0)
MCHC: 31.8 g/dL (ref 30.0–36.0)
MCV: 88.3 fL (ref 80.0–100.0)
Platelets: 195 10*3/uL (ref 150–400)
RBC: 4.1 MIL/uL — ABNORMAL LOW (ref 4.22–5.81)
RDW: 18.3 % — ABNORMAL HIGH (ref 11.5–15.5)
WBC: 9.5 10*3/uL (ref 4.0–10.5)
nRBC: 0 % (ref 0.0–0.2)

## 2022-04-23 LAB — GLUCOSE, CAPILLARY
Glucose-Capillary: 107 mg/dL — ABNORMAL HIGH (ref 70–99)
Glucose-Capillary: 121 mg/dL — ABNORMAL HIGH (ref 70–99)
Glucose-Capillary: 93 mg/dL (ref 70–99)

## 2022-04-23 LAB — MAGNESIUM: Magnesium: 2.3 mg/dL (ref 1.7–2.4)

## 2022-04-23 NOTE — Progress Notes (Addendum)
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  Of note patient was recently admitted to hospital between  12/8 - 12/12 for sepsis due to UTI (Kleb), hypernatremia, encephalopathy and was discharged back to SNF.  Patient again presented on 12/26 with recurrent (and even worse) hypernatremia and worsened renal function. Reportedly tested positive for covid 12/18, cannot tell if he was treated.  In the ED, patient was severely cachectic with unintelligible speech was not able to answer questions or follow commands.  Initial sodium level was 161 with creatinine of 3.3 from baseline 1.3 on 04/05/2022.  WBC was elevated at 11.4.  COVID was positive with CT value of 24.3 patient was then admitted hospital for resuscitation, goals of care discussion.   Assessment and Plan:  * Acute renal failure superimposed on stage 3b chronic kidney disease (HCC) Creatinine has improved to 1.6.  Baseline creatinine around 1.8-1.9.  Presenting creatinine of 3.3 from initial presentation.  Likely poor oral intake.  Continue Foley catheter for history of urinary retention.    Renal ultrasound did not show any acute findings.  Hypernatremia Likely hypovolemic.  Serum osmolality was 373.   Poor prognosis.  Recurrence of hypernatremia at this time.  Remains on D5 water.  Latest sodium of 149.  Mild hypokalemia.  Eating potassium with D5 water.  TBI (traumatic brain injury, history of SAH and IVH with multiple traumas. Palliative care has been consulted for goals of care discussion.  Hospice patient has been  considered at this time.  COVID-19 virus infection Likely contributing to worsening condition but has baseline failure to thrive.  Received remdesivir during hospitalization  Urinary retention -Recent urology evaluation on 04/11/2022 and failed TOV.  Continue Foley catheter.  Protein-calorie malnutrition, severe Nutrition and speech therapy consulted.  Previously was on dysphagia 3 diet.  Continue IV fluids with D5 water and potassium.  On dysphagia 1 diet.  Deconditioning, debility, ongoing decline.  Plan for hospice on discharge.  Hospice wants to wait 5 days prior to discharge.  Patient tested positive for COVID 19 on 04/19/2022 and today is day 4..   DVT prophylaxis: apixaban (ELIQUIS) tablet 2.5 mg Start: 04/19/22 2200 apixaban (ELIQUIS) tablet 2.5 mg   Code Status:     Code Status: DNR  Disposition: Hospice arrangement awaiting.  Status is: Inpatient  Remains inpatient appropriate because: Multiple comorbidities, hypernatremia, renal failure, COVID-19 infection, plan for hospice.   Family Communication:  I spoke with the patient's ex spouse on the phone on 04/20/2022.  Plan for hospice at this time.    Consultants:  Palliative care  Procedures:  None  Antimicrobials:  None currently   Subjective: Today, patient was seen and examined at bedside.  Little more alert awake and Communicative.  Trying to answer few questions but disoriented.  Objective: Vitals:   04/22/22 2015 04/22/22 2030 04/23/22 0450 04/23/22 0905  BP: 126/84  128/85 109/75  Pulse:   78 85  Resp: (!) 82 18 17   Temp: 98 F (36.7 C)  97.8 F (36.6 C)   TempSrc: Axillary     SpO2:   100%   Weight:  Height:        Intake/Output Summary (Last 24 hours) at 04/23/2022 1115 Last data filed at 04/23/2022 1056 Gross per 24 hour  Intake 2527.58 ml  Output 1000 ml  Net 1527.58 ml    Filed Weights   04/19/22 1726 04/20/22 0500 04/22/22 0500  Weight: 44.7 kg 44.9 kg 46.8 kg    Physical  Examination: Body mass index is 14.39 kg/m.  General: Alert awake and mildly Communicative, appears chronically ill deconditioned, disoriented, cachectic HENT:   No scleral pallor or icterus noted. Oral mucosa is dry Chest:  Clear breath sounds.  No crackles or wheezes.  CVS: S1 &S2 heard. No murmur.  Regular rate and rhythm. Abdomen: Soft, nontender, nondistended.  Bowel sounds are heard.  Foley catheter in place. Extremities: No cyanosis, clubbing or edema.  Peripheral pulses are palpable. Psych: More alert awake and mildly Communicative, disoriented, CNS:   Moves all extremities, generalized weakness noted, thin extremities, flexed posture Skin: Warm and dry.  No rashes noted.  Data Reviewed:   CBC: Recent Labs  Lab 04/19/22 1253 04/20/22 0429 04/21/22 0453 04/22/22 0335 04/22/22 1359  WBC 11.4* 10.4 11.0* 13.5* 11.4*  NEUTROABS 9.1* 8.0* 9.2* 11.2*  --   HGB 14.7 13.6 11.3* 11.4* 10.9*  HCT 47.4 43.2 37.3* 36.7* 34.6*  MCV 89.3 90.0 90.5 90.0 88.9  PLT 323 236 185 179 181     Basic Metabolic Panel: Recent Labs  Lab 04/19/22 1253 04/20/22 0429 04/21/22 0453 04/22/22 0335 04/22/22 1131 04/23/22 0615  NA 161*  --  164*  --  152* 149*  K 3.8  --  3.3*  --  3.2* 3.2*  CL 128*  --  >130*  --  127* 126*  CO2 21*  --  24  --  23 19*  GLUCOSE 113*  --  84  --  110* 105*  BUN 67*  --  61*  --  44* 34*  CREATININE 3.31*  --  2.58*  --  2.10* 1.68*  CALCIUM 9.2  --  8.8*  --  8.3* 8.3*  MG  --  2.6* 2.4 2.3  --  2.3     Liver Function Tests: Recent Labs  Lab 04/19/22 1253  AST 23  ALT 27  ALKPHOS 88  BILITOT 1.9*  PROT 7.1  ALBUMIN 3.1*      Radiology Studies: No results found.    LOS: 4 days    Flora Lipps, MD [facility] Available via Epic secure chat 7am-7pm After these hours, please refer to coverage provider listed on [email or web address] 04/23/2022, 11:15 AM

## 2022-04-23 NOTE — Progress Notes (Signed)
tele called and stated patient was in vtach. Went and checked on patient and he was asleep. Looked at the monitor and it is showing sinus rhythm with a hr of 68. MD Pokhrel notified.

## 2022-04-23 NOTE — Progress Notes (Signed)
Patient has not eaten all day. Patient was able to take morning medicines crushed in applesauce. Evening flomax was not given due to not being able to open capsule and put in apple sauce. MD Pokhrel notified.

## 2022-04-24 LAB — GLUCOSE, CAPILLARY
Glucose-Capillary: 91 mg/dL (ref 70–99)
Glucose-Capillary: 103 mg/dL — ABNORMAL HIGH (ref 70–99)
Glucose-Capillary: 83 mg/dL (ref 70–99)

## 2022-04-24 LAB — BASIC METABOLIC PANEL
Anion gap: 3 — ABNORMAL LOW (ref 5–15)
BUN: 27 mg/dL — ABNORMAL HIGH (ref 8–23)
CO2: 20 mmol/L — ABNORMAL LOW (ref 22–32)
Calcium: 8.2 mg/dL — ABNORMAL LOW (ref 8.9–10.3)
Chloride: 118 mmol/L — ABNORMAL HIGH (ref 98–111)
Creatinine, Ser: 1.64 mg/dL — ABNORMAL HIGH (ref 0.61–1.24)
GFR, Estimated: 42 mL/min — ABNORMAL LOW (ref >60)
Glucose, Bld: 92 mg/dL (ref 70–99)
Potassium: 3.4 mmol/L — ABNORMAL LOW (ref 3.5–5.1)
Sodium: 141 mmol/L (ref 135–145)

## 2022-04-24 MED ORDER — ENOXAPARIN SODIUM 40 MG/0.4ML IJ SOSY: 40.0000 mg | PREFILLED_SYRINGE | INTRAMUSCULAR | Status: DC

## 2022-04-24 MED ORDER — ENOXAPARIN SODIUM 30 MG/0.3ML IJ SOSY
30.0000 mg | PREFILLED_SYRINGE | INTRAMUSCULAR | Status: DC
  Administered 2022-04-24 – 2022-04-28 (×5): 30 mg via SUBCUTANEOUS
  Filled 2022-04-24 (×5): qty 0.3

## 2022-04-24 NOTE — Progress Notes (Addendum)
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  Of note patient was recently admitted to hospital between  12/8 - 12/12 for sepsis due to UTI (Kleb), hypernatremia, encephalopathy and was discharged back to SNF.  Patient again presented on 12/26 with recurrent (and even worse) hypernatremia and worsened renal function. Reportedly tested positive for covid 12/18, cannot tell if he was treated.  In the ED, patient was severely cachectic with unintelligible speech was not able to answer questions or follow commands.  Initial sodium level was 161 with creatinine of 3.3 from baseline 1.3 on 04/05/2022.  WBC was elevated at 11.4.  COVID was positive with CT value of 24.3 patient was then admitted hospital for resuscitation, goals of care discussion.   Assessment and Plan:  * Acute renal failure superimposed on stage 3b chronic kidney disease (HCC) Latest creatinine of to 1.6.  Baseline creatinine around 1.8-1.9.  Presenting creatinine of 3.3 from initial presentation.  Likely poor oral intake.  Continue Foley catheter for history of urinary retention and comfort..    Renal ultrasound did not show any acute findings.  TBI (traumatic brain injury, history of SAH and IVH with multiple traumas. Palliative care has been consulted for goals of care discussion.  Hospice patient has been considered at this time.  No changes in his mentation.  Appears to be more sluggish and garbled today.  Has mittens in place.  Dysphagia, poor oral safety.  Unable to swallow pills or take anything by mouth.  Will  minimize oral medications at this time.  Will discontinue unnecessary medications  Hypernatremia Likely hypovolemic.  Serum osmolality was 373.   Poor prognosis.  Recurrence of hypernatremia at this time.  Remains on D5 water.  Latest sodium of 141.  A temporary fix with the D5 water.  Mild hypokalemia.  Currently on potassium with D5 water.  COVID-19 virus infection Likely contributing to worsening condition but has baseline failure to thrive.  Received remdesivir during hospitalization  Urinary retention -Recent urology evaluation on 04/11/2022 and failed TOV.  Continue Foley catheter.  Protein-calorie malnutrition, severe Nutrition and speech therapy consulted.  Previously was on dysphagia 3 diet.  Continue IV fluids with D5 water and potassium.  On dysphagia 1 diet.  Unsafe for oral nutrition assessment  Deconditioning, debility, ongoing decline.  Plan for hospice on discharge.  Patient tested positive for COVID 19 on 04/19/2022 and today is day 5.  Patient might have a bed in hospice tomorrow.   DVT prophylaxis: Unable to swallow Eliquis..  Will put on Lovenox subcu.  Code Status:     Code Status: DNR  Disposition: Hospice after completion of 5-day isolation likely on Tuesday as per hospice team..  Status is: Inpatient  Remains inpatient appropriate because: Multiple comorbidities, hypernatremia, renal failure, COVID-19 infection, awaiting for hospice   Family Communication:  I spoke with the patient's ex spouse on the phone on 04/20/2022.  Plan for hospice at this time.    Consultants:  Palliative care  Procedures:  None  Antimicrobials:  None currently  Subjective: Today, patient was seen and examined at bedside.  Appears to be more sluggish with slurred speech  and mittens.  Clinical condition overall has not improved.  Objective: Vitals:   04/23/22 0450 04/23/22 0905 04/23/22 1353 04/23/22 2054  BP: 128/85 109/75 130/69 120/79  Pulse: 78 85 69 72  Resp: 17  20  18   Temp: 97.8 F (36.6 C)  98 F (36.7 C) 98.1 F (36.7 C)  TempSrc:   Axillary Axillary  SpO2: 100%  100% 100%  Weight:      Height:        Intake/Output Summary (Last 24 hours) at 04/24/2022 0954 Last data filed at 04/23/2022 2016 Gross per 24 hour  Intake 799.83 ml  Output 200 ml  Net 599.83 ml    Filed Weights   04/19/22 1726 04/20/22 0500 04/22/22 0500  Weight: 44.7 kg 44.9 kg 46.8 kg    Physical Examination: Body mass index is 14.39 kg/m.   General: Alert and awake but garbled speech, confused, deconditioned and ill and frail appearing.   HENT:   No scleral pallor or icterus noted. Oral mucosa is dry Chest:  Clear breath sounds.  No crackles or wheezes.  CVS: S1 &S2 heard. No murmur.  Regular rate and rhythm. Abdomen: Soft, nontender, nondistended.  Bowel sounds are heard.  Foley catheter in place. Extremities: No cyanosis, clubbing or edema.  Peripheral pulses are palpable. Psych: Alert awake but garbled and confused CNS:   Moves all extremities, generalized weakness noted, thin extremities, flexed posture Skin: Warm and dry.  No rashes noted.  Data Reviewed:   CBC: Recent Labs  Lab 04/19/22 1253 04/20/22 0429 04/21/22 0453 04/22/22 0335 04/22/22 1359 04/23/22 1132  WBC 11.4* 10.4 11.0* 13.5* 11.4* 9.5  NEUTROABS 9.1* 8.0* 9.2* 11.2*  --   --   HGB 14.7 13.6 11.3* 11.4* 10.9* 11.5*  HCT 47.4 43.2 37.3* 36.7* 34.6* 36.2*  MCV 89.3 90.0 90.5 90.0 88.9 88.3  PLT 323 236 185 179 181 195     Basic Metabolic Panel: Recent Labs  Lab 04/19/22 1253 04/20/22 0429 04/21/22 0453 04/22/22 0335 04/22/22 1131 04/23/22 0615 04/24/22 0534  NA 161*  --  164*  --  152* 149* 141  K 3.8  --  3.3*  --  3.2* 3.2* 3.4*  CL 128*  --  >130*  --  127* 126* 118*  CO2 21*  --  24  --  23 19* 20*  GLUCOSE 113*  --  84  --  110* 105* 92  BUN 67*  --  61*  --  44* 34* 27*  CREATININE 3.31*  --  2.58*  --  2.10* 1.68* 1.64*  CALCIUM 9.2  --  8.8*  --  8.3* 8.3* 8.2*   MG  --  2.6* 2.4 2.3  --  2.3  --      Liver Function Tests: Recent Labs  Lab 04/19/22 1253  AST 23  ALT 27  ALKPHOS 88  BILITOT 1.9*  PROT 7.1  ALBUMIN 3.1*      Radiology Studies: No results found.    LOS: 5 days    Flora Lipps, MD [facility] Available via Epic secure chat 7am-7pm After these hours, please refer to coverage provider listed on [email or web address] 04/24/2022, 9:54 AM

## 2022-04-24 NOTE — Progress Notes (Signed)
Pt would not take morning meds.  Pt very difficult to communicate with for needs.  Mouth care given.  Somnolent the majority of the morning. Notified MD.

## 2022-04-25 LAB — GLUCOSE, CAPILLARY
Glucose-Capillary: 112 mg/dL — ABNORMAL HIGH (ref 70–99)
Glucose-Capillary: 102 mg/dL — ABNORMAL HIGH (ref 70–99)
Glucose-Capillary: 107 mg/dL — ABNORMAL HIGH (ref 70–99)

## 2022-04-25 NOTE — Care Management Important Message (Signed)
Important Message  Patient Details  Name: Dominic Smith MRN: [id number] Date of Birth: 10-18-1940   Medicare Important Message Given:  Other (see comment)  Disposition to discharge with hospice services.  Medicare IM withheld at this time out of respect for patient and family.    Dannette Barbara 04/25/2022, 10:30 AM

## 2022-04-25 NOTE — Progress Notes (Signed)
Another incontinent, soft brown BM.  Sacral dressing and dressing to right hip changed.  Turned to left side.

## 2022-04-25 NOTE — Progress Notes (Signed)
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  Of note patient was recently admitted to hospital between  12/8 - 12/12 for sepsis due to UTI ([place]), hypernatremia, encephalopathy and was discharged back to SNF.  Patient again presented on 12/26 with recurrent (and even worse) hypernatremia and worsened renal function. Reportedly tested positive for covid 12/18, cannot tell if he was treated.  In the ED, patient was severely cachectic with unintelligible speech was not able to answer questions or follow commands.  Initial sodium level was 161 with creatinine of 3.3 from baseline 1.3 on 04/05/2022.  WBC was elevated at 11.4.  COVID was positive with CT value of 24.3 patient was then admitted hospital for resuscitation, goals of care discussion.  During hospitalization, palliative care has seen the patient.  At this time patient is awaiting for hospice placement.   Assessment and Plan:  * Acute renal failure superimposed on stage 3b chronic kidney disease (HCC) Latest creatinine of to 1.6.  Baseline creatinine around 1.8-1.9.  Presenting creatinine of 3.3 from initial presentation.  Likely poor oral intake.  Continue Foley catheter for history of urinary retention and comfort..    Renal ultrasound did not show any acute findings.  TBI (traumatic brain injury, history of SAH and IVH with multiple traumas. Palliative care on board.  Awaiting for hospice placement..  Unimproved.  Dysphagia, poor oral safety.  Unable to swallow pills  by mouth.  Will minimize oral medications at this time.   Discontinue PO medications  Hypernatremia Stable on D5 water.  Likely to be a recurrent issue once D5 is discontinued.  Mild hypokalemia.  Currently on potassium with D5 water.  COVID-19 virus infection Likely contributing to worsening condition but has baseline failure to thrive.  Received remdesivir during hospitalization  Urinary retention -Recent urology evaluation on 04/11/2022 and failed TOV.  Continue Foley catheter for comfort and urinary retention..  Protein-calorie malnutrition, severe Speech therapy had recommended dysphagia 1 diet.  Unsafe for oral at this time.  Continue IV fluids.  Deconditioning, debility, ongoing decline.  Plan for hospice on discharge likely 04-26-22.  Patient tested positive for COVID 19 on 04/19/2022 and today is day 5.     DVT prophylaxis: Unable to swallow Eliquis..  Will put on Lovenox subcu.  Code Status:     Code Status: DNR  Disposition: Hospice likely in 04-26-22  Status is: Inpatient  Remains inpatient appropriate because: Multiple comorbidities, hypernatremia, renal failure, COVID-19 infection, awaiting for hospice   Family Communication:  I spoke with the patient's ex spouse on the phone on 04/20/2022.     Consultants:  Palliative care  Procedures:  None  Antimicrobials:  None currently  Subjective: Today, patient was seen and examined at bedside.  No interval changes reported.  Still slightly confused and disoriented on mittens.  No clinical improvement.  Objective: Vitals:   04/23/22 2054 04/24/22 2245 04/25/22 0656 04/25/22 0657  BP: 120/79 133/75 121/70 121/70  Pulse: 72 88 85 79  Resp: 18 16 18 18   Temp: 98.1 F (36.7 C) 98.2 F (36.8 C) 97.9 F (36.6 C) 97.9 F (36.6 C)  TempSrc: Axillary Oral Oral  Oral  SpO2: 100% 100% 100% 100%  Weight:      Height:        Intake/Output Summary (Last 24 hours) at 04/25/2022 0740 Last data filed at 04/25/2022 0700 Gross per 24 hour  Intake 120 ml  Output --  Net 120 ml     Filed Weights   04/19/22 1726 04/20/22 0500 04/22/22 0500  Weight: 44.7 kg 44.9 kg 46.8 kg    Physical Examination: Body mass index is 14.39 kg/m.   General: Awake but confused debilitated deconditioned and frail appearing male, cachectic HENT:   No scleral pallor or icterus noted.  Dry oral mucosa Chest:  Clear breath sounds.  No crackles or wheezes.  CVS: S1 &S2 heard. No murmur.  Regular rate and rhythm. Abdomen: Soft, nontender, nondistended.  Bowel sounds are heard.  Indwelling Foley catheter in place. Extremities: No cyanosis, clubbing or edema.  Peripheral pulses are palpable. Psych: Alert but confused, on mittens, CNS:   Moves all extremities, generalized weakness noted, thin extremities, flexed posture Skin: Warm and dry.  No rashes noted.  Data Reviewed:   CBC: Recent Labs  Lab 04/19/22 1253 04/20/22 0429 04/21/22 0453 04/22/22 0335 04/22/22 1359 04/23/22 1132  WBC 11.4* 10.4 11.0* 13.5* 11.4* 9.5  NEUTROABS 9.1* 8.0* 9.2* 11.2*  --   --   HGB 14.7 13.6 11.3* 11.4* 10.9* 11.5*  HCT 47.4 43.2 37.3* 36.7* 34.6* 36.2*  MCV 89.3 90.0 90.5 90.0 88.9 88.3  PLT 323 236 185 179 181 195     Basic Metabolic Panel: Recent Labs  Lab 04/19/22 1253 04/20/22 0429 04/21/22 0453 04/22/22 0335 04/22/22 1131 04/23/22 0615 04/24/22 0534  NA 161*  --  164*  --  152* 149* 141  K 3.8  --  3.3*  --  3.2* 3.2* 3.4*  CL 128*  --  >130*  --  127* 126* 118*  CO2 21*  --  24  --  23 19* 20*  GLUCOSE 113*  --  84  --  110* 105* 92  BUN 67*  --  61*  --  44* 34* 27*  CREATININE 3.31*  --  2.58*  --  2.10* 1.68* 1.64*  CALCIUM 9.2  --  8.8*  --  8.3* 8.3* 8.2*  MG  --  2.6* 2.4 2.3  --  2.3  --      Liver Function Tests: Recent Labs  Lab 04/19/22 1253  AST 23  ALT 27  ALKPHOS 88  BILITOT 1.9*  PROT 7.1  ALBUMIN 3.1*      Radiology Studies: No results found.    LOS: 6 days    Flora Lipps, MD [facility] Available via Epic secure chat  7am-7pm After these hours, please refer to coverage provider listed on [email or web address] 04/25/2022, 7:40 AM

## 2022-04-25 NOTE — Progress Notes (Addendum)
Tried to state name when asked and weakly responded to hand squeeze command.  When receiving nasal ointment swab batted hand away. Continues to be noninteractive except when receiving care and then is resistive

## 2022-04-26 LAB — GLUCOSE, CAPILLARY
Glucose-Capillary: 97 mg/dL (ref 70–99)
Glucose-Capillary: 89 mg/dL (ref 70–99)
Glucose-Capillary: 101 mg/dL — ABNORMAL HIGH (ref 70–99)
Glucose-Capillary: 95 mg/dL (ref 70–99)
Glucose-Capillary: 110 mg/dL — ABNORMAL HIGH (ref 70–99)

## 2022-04-26 NOTE — Progress Notes (Signed)
Pt has been gesturing appropriately and responding to my questions tonight. Oral care provided. Facial grimacing upon pt opening mouth. Lips dry with excess green mucous looking substance covering teeth and mucosa.

## 2022-04-26 NOTE — TOC Transition Note (Addendum)
Transition of Care [facility]) - CM/SW Discharge Note   Patient Details  Name: Dominic Smith MRN: [id number] Date of Birth: Sep 22, 1940  Transition of Care [facility]) CM/SW Contact:  Iona Beard, [place] Phone Number: 04/26/2022, 11:45 AM   Clinical Narrative:    CSW updated that [facility] is ready to accept pt today. MD updated and D/C completed. Amber with [facility] states they will set up EMS transportation once consents have been signed by family. CSW to update RN and provide number for report. TOC signing off.   CSW updated that hospice is attempting to get consents signed. Hospice will call for transport. MD and RN updated.  Final next level of care: [place] Barriers to Discharge: Barriers Resolved   Patient Goals and CMS Choice CMS Medicare.gov Compare Post Acute Care list provided to:: Patient Represenative (must comment) Choice offered to / list presented to : Spouse  Discharge Placement                  Patient to be transferred to facility by: EMS      Discharge Plan and Services Additional resources added to the After Visit Summary for                                       Social Determinants of Health (SDOH) Interventions [place]: Low Risk  (01/15/2022)  Tobacco Use: Low Risk  (04/19/2022)     Readmission Risk Interventions    04/04/2022    1:40 PM  Readmission Risk Prevention Plan  Transportation Screening Complete  HRI or [place] Complete  Social Work Consult for [place] Planning/Counseling Complete  Palliative Care Screening Not Applicable  Medication Review [employer]) Complete

## 2022-04-26 NOTE — Progress Notes (Incomplete)
[facility] Discharge Note  Admit Date:  04/19/2022 Discharge date: 04/26/2022   [name] to be D/C'd  [place]  per MD order.  AVS completed. Patient/caregiver able to verbalize understanding.  Discharge Medication: Allergies as of 04/26/2022   No Known Allergies      Medication List     STOP taking these medications    apixaban 2.5 MG Tabs tablet Commonly known as: ELIQUIS   aspirin 81 MG chewable tablet   B-complex with vitamin C tablet   cefdinir 300 MG capsule Commonly known as: OMNICEF   Chlorhexidine Gluconate Cloth 2 % Pads   feeding supplement Liqd   Gerhardt's butt cream Crea   megestrol 400 MG/10ML suspension Commonly known as: MEGACE   metoprolol tartrate 25 MG tablet Commonly known as: LOPRESSOR   mirtazapine 7.5 MG tablet Commonly known as: REMERON   molnupiravir EUA 200 MG Caps capsule Commonly known as: LAGEVRIO   mouth rinse Liqd solution   Nutritional Drink Liqd   polyethylene glycol 17 g packet Commonly known as: MIRALAX / GLYCOLAX       TAKE these medications    acetaminophen 325 MG tablet Commonly known as: TYLENOL Place 1-2 tablets (325-650 mg total) into feeding tube every 4 (four) hours as needed for mild pain. What changed:  how much to take how to take this   alum & mag hydroxide-simeth 200-200-20 MG/5ML suspension Commonly known as: MAALOX/MYLANTA Take 30 mLs by mouth every 4 (four) hours as needed for indigestion. What changed:  when to take this additional instructions Another medication with the same name was removed. Continue taking this medication, and follow the directions you see here.   QUEtiapine 50 MG tablet Commonly known as: SEROQUEL Take 1 tablet (50 mg total) by mouth every 8 (eight) hours as needed (agitation).   tamsulosin 0.4 MG Caps capsule Commonly known as: FLOMAX Take 1 capsule (0.4 mg total) by mouth daily after supper. What changed: additional instructions        Discharge  Assessment: Vitals:   04/25/22 2117 04/26/22 0503  BP: (!) 147/85 123/87  Pulse: 97 96  Resp: 18 18  Temp: 98 F (36.7 C) 98 F (36.7 C)  SpO2: 100% 100%   Skin clean, dry and intact without evidence of skin break down, no evidence of skin tears noted. IV catheter discontinued intact. Site without signs and symptoms of complications - no redness or edema noted at insertion site, patient denies c/o pain - only slight tenderness at site.  Dressing with slight pressure applied.  D/c Instructions-Education: Discharge instructions given to Erlene Quan, RN at [facility], with verbalized understanding. D/c education completed with patient/family including follow up instructions, medication list, d/c activities limitations if indicated, with other d/c instructions as indicated by MD -Patient instructed to return to ED, call 911, or call MD for any changes in condition.  Patient escorted via stretcher, and D/C to [facility] via Ambulance.  Richrd Prime, LPN 08/01/2008 07:12 PM

## 2022-04-26 NOTE — Progress Notes (Signed)
Nutrition Brief Note  Chart reviewed. Pt now transitioning to comfort care. Per palliative care notes, plan to transfer to residential hospice once bed is available. No further nutrition interventions planned at this time.  Please re-consult as needed.   Loistine Chance, RD, LDN, [place] Registered Dietitian II Certified Diabetes Care and Education Specialist Please refer to [facility] for RD and/or RD on-call/weekend/after hours pager

## 2022-04-26 NOTE — Discharge Summary (Addendum)
Physician Discharge Summary  Dominic Smith NLZ:[id number] DOB: 1940/06/08 DOA: 04/19/2022  PCP: Hal Morales, DO  Admit date: 04/19/2022 Discharge date: 04/29/22.  Admitted From: Home  Discharge disposition: Hospice facility   Recommendations for Outpatient Follow-Up:   Follow-up plan as per hospice care provider.   Discharge Diagnosis:   Principal Problem:   Acute renal failure superimposed on stage 3b chronic kidney disease (HCC) Active Problems:   Hypernatremia   TBI (traumatic brain injury) ([place])   COVID-19 virus infection   Protein-calorie malnutrition, severe   Urinary retention   Discharge Condition: Improved.  Diet recommendation: Dysphagia 1 diet as tolerated  Wound care: None.  Code status: DNR  History of Present Illness:   Mr. 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  Of note patient was recently admitted to hospital between  12/8 - 12/12 for sepsis due to UTI ([place]), hypernatremia, encephalopathy and was discharged back to SNF. Patient again presented on 12/26 with recurrent (and even worse) hypernatremia and worsened renal function. Reportedly tested positive for covid 12/18, cannot tell if he was treated.  In the ED, patient was severely cachectic with unintelligible speech was not able to answer questions or follow commands.  Initial sodium level was 161 with creatinine of 3.3 from baseline 1.3 on 04/05/2022.  WBC was elevated at 11.4.  COVID was positive with CT value of 24.3 patient was then admitted hospital for resuscitation, goals of care discussion.During hospitalization, palliative care followed the patient and patient was considered for hospice.    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  Baseline creatinine around 1.8-1.9.  Presenting creatinine of 3.3 from initial presentation.  Likely poor oral intake.  Continue Foley catheter for history of urinary retention and comfort..    Renal ultrasound did not show any acute findings.   TBI (traumatic brain injury, history of SAH and IVH with multiple traumas. Palliative care was consulted.  Awaiting for hospice placement..  Patient with poor oral safety dysphagia and recurrent hypernatremia.   Dysphagia, poor oral safety.  Unable to swallow pills  by mouth.    Hypernatremia D5 water during hospitalization.  Likely to be a recurrent issue once D5 is discontinued.   Mild hypokalemia.  Received potassium supplement during hospitalization.   COVID-19 virus infection Likely contributing to worsening condition but has baseline failure to thrive.  Received remdesivir during hospitalization   Urinary retention -Recent urology evaluation on 04/11/2022 and failed TOV.  Continue Foley catheter for comfort and urinary retention.   Protein-calorie malnutrition, severe Speech therapy had recommended dysphagia 1 diet.  Unsafe for oral at this time.  Continue IV fluids.   Deconditioning, debility, ongoing decline.  Plan for hospice on discharge   Disposition.  At this time, patient is stable for disposition to hospice facility.  Medical Consultants:   Palliative care  Procedures:    None Subjective:   Today, patient was seen and examined at bedside.  Patient appears to be little more alert and mildly Communicative.  Denies pain.  Little more communicative today.  Discharge Exam:   Vitals:   04/28/22 2006 04/29/22 0542  BP: (!) 165/80 Marland Kitchen)  172/82  Pulse: 95 99  Resp: 18 20  Temp: 97.9 F (36.6 C) 97.7 F (36.5 C)  SpO2: 100% 100%    Vitals:   04/28/22 0316 04/28/22 1300 04/28/22 2006 04/29/22 0542  BP: 124/69 (!) 135/92 (!) 165/80 (!) 172/82  Pulse: 90 91 95 99  Resp: 20 18 18 20   Temp: 98.9 F (37.2 C) 98.3 F (36.8 C) 97.9 F (36.6 C) 97.7 F (36.5 C)  TempSrc:  Axillary Axillary Oral  SpO2: 100% 100% 100% 100%  Weight:      Height:       Body mass index is 14.39 kg/m.  General: Alert awake, not in obvious distress, elderly male, deconditioned, mild Communicative frail-appearing, cachectic HENT: pupils equally reacting to light,  No scleral pallor or icterus noted. Oral mucosa is dry. Chest:  Clear breath sounds.  Diminished breath sounds bilaterally. No crackles or wheezes.  CVS: S1 &S2 heard. No murmur.  Regular rate and rhythm. Abdomen: Soft, nontender, nondistended.  Bowel sounds are heard.  Indwelling Foley catheter in place. Extremities: No cyanosis, clubbing or edema.  Peripheral pulses are palpable. Psych: Alert, awake and Communicative, oriented to place and self. CNS:  No cranial nerve deficits.,  Moves all extremities, generalized weakness noted, thin extremities, flexed posture Skin: Warm and dry.  No rashes noted.  The results of significant diagnostics from this hospitalization (including imaging, microbiology, ancillary and laboratory) are listed below for reference.     Diagnostic Studies:   RENAL  Result Date: 04/19/2022 CLINICAL DATA:  Acute renal injury EXAM: RENAL / URINARY TRACT ULTRASOUND COMPLETE COMPARISON:  05/14/2021 FINDINGS: Right Kidney: Renal measurements: 9.1 x 3.9 x 4.6 cm. = volume: 85.4 mL. Echogenicity within normal limits. No mass or hydronephrosis visualized. Left Kidney: Renal measurements: 9.1 x 4.9 x 4.7 cm. = volume: 108 mL. Echogenicity within normal limits. No mass or hydronephrosis visualized. Bladder: Not well visualized Other: None. IMPRESSION: No acute abnormality noted. Electronically Signed   By: 05/16/2021 M.D.   On: 04/19/2022 15:35   CT HEAD WO  CONTRAST (04/21/2022)  Result Date: 04/19/2022 CLINICAL DATA:  History of COVID and altered mental status EXAM: CT HEAD WITHOUT CONTRAST TECHNIQUE: Contiguous axial images were obtained from the base of the skull through the vertex without intravenous contrast. RADIATION DOSE REDUCTION: This exam was performed according to the departmental dose-optimization program which includes automated exposure control, adjustment of the mA and/or kV according to patient size and/or use of iterative reconstruction technique. COMPARISON:  04/01/2022 FINDINGS: Brain: No evidence of acute infarction, hemorrhage, hydrocephalus, extra-axial collection or mass lesion/mass effect. Chronic atrophic and ischemic changes are noted. Vascular: No hyperdense vessel or unexpected calcification. Skull: Normal. Negative for fracture or focal lesion. Sinuses/Orbits: Persistent thickening of the maxillary antrum wall is noted as well as similar findings involving the sphenoid and left frontal sinus stable from the prior exam. Other: None IMPRESSION: Chronic atrophic and ischemic changes without acute intracranial abnormality. Changes suggestive of chronic sinusitis. Electronically Signed   By: 14/11/2021 M.D.   On: 04/19/2022 15:22   DG Chest Portable 1 View  Result Date: 04/19/2022 CLINICAL DATA:  Altered mental status EXAM: PORTABLE CHEST 1 VIEW COMPARISON:  CXR 04/01/22 FINDINGS: No pleural effusion. No pneumothorax. Unchanged cardiac and mediastinal contours. Pulmonary hyperinflation. Aortic atherosclerotic calcifications. No displaced rib fractures. Visualized upper abdomen is notable for a gas distended stomach. IMPRESSION: No focal airspace opacity. Electronically Signed   By: 14/8/23 M.D.   On: 04/19/2022  12:31     Labs:   Basic Metabolic Panel: Recent Labs  Lab 04/22/22 1131 04/23/22 0615 04/24/22 0534  NA 152* 149* 141  K 3.2* 3.2* 3.4*  CL 127* 126* 118*  CO2 23 19* 20*  GLUCOSE 110* 105* 92  BUN 44* 34* 27*   CREATININE 2.10* 1.68* 1.64*  CALCIUM 8.3* 8.3* 8.2*  MG  --  2.3  --    GFR Estimated Creatinine Clearance: 23 mL/min (A) (by C-G formula based on SCr of 1.64 mg/dL (H)). Liver Function Tests: No results for input(s): "AST", "ALT", "ALKPHOS", "BILITOT", "PROT", "ALBUMIN" in the last 168 hours.  No results for input(s): "LIPASE", "AMYLASE" in the last 168 hours.  No results for input(s): "AMMONIA" in the last 168 hours. Coagulation profile No results for input(s): "INR", "PROTIME" in the last 168 hours.  CBC: Recent Labs  Lab 04/22/22 1359 04/23/22 1132  WBC 11.4* 9.5  HGB 10.9* 11.5*  HCT 34.6* 36.2*  MCV 88.9 88.3  PLT 181 195   Cardiac Enzymes: No results for input(s): "CKTOTAL", "CKMB", "CKMBINDEX", "TROPONINI" in the last 168 hours. BNP: Invalid input(s): "POCBNP" CBG: Recent Labs  Lab 04/27/22 1645 04/27/22 2228 04/28/22 0738 04/28/22 1112 04/28/22 1623  GLUCAP 102* 92 102* 109* 142*   D-Dimer No results for input(s): "DDIMER" in the last 72 hours. Hgb A1c No results for input(s): "HGBA1C" in the last 72 hours. Lipid Profile No results for input(s): "CHOL", "HDL", "LDLCALC", "TRIG", "CHOLHDL", "LDLDIRECT" in the last 72 hours. Thyroid function studies No results for input(s): "TSH", "T4TOTAL", "T3FREE", "THYROIDAB" in the last 72 hours.  Invalid input(s): "FREET3" Anemia work up No results for input(s): "VITAMINB12", "FOLATE", "FERRITIN", "TIBC", "IRON", "RETICCTPCT" in the last 72 hours. Microbiology Recent Results (from the past 240 hour(s))  Resp panel by RT-PCR (RSV, Flu A&B, Covid) Anterior Nasal Swab     Status: Abnormal   Collection Time: 04/19/22  1:00 PM   Specimen: Anterior Nasal Swab  Result Value Ref Range Status   SARS Coronavirus 2 by RT PCR POSITIVE (A) NEGATIVE Final    Comment: (NOTE) SARS-CoV-2 target nucleic acids are DETECTED.  The SARS-CoV-2 RNA is generally detectable in upper respiratory specimens during the acute phase of  infection. Positive results are indicative of the presence of the identified virus, but do not rule out bacterial infection or co-infection with other pathogens not detected by the test. Clinical correlation with patient history and other diagnostic information is necessary to determine patient infection status. The expected result is Negative.  Fact Sheet for Patients: BloggerCourse.com  Fact Sheet for Healthcare Providers: SeriousBroker.it  This test is not yet approved or cleared by the [place] FDA and  has been authorized for detection and/or diagnosis of SARS-CoV-2 by FDA under an Emergency Use Authorization (EUA).  This EUA will remain in effect (meaning this test can be used) for the duration of  the COVID-19 declaration under Section 564(b)(1) of the A ct, 21 U.S.C. section 360bbb-3(b)(1), unless the authorization is terminated or revoked sooner.     Influenza A by PCR NEGATIVE NEGATIVE Final   Influenza B by PCR NEGATIVE NEGATIVE Final    Comment: (NOTE) The Xpert Xpress SARS-CoV-2/FLU/RSV plus assay is intended as an aid in the diagnosis of influenza from Nasopharyngeal swab specimens and should not be used as a sole basis for treatment. Nasal washings and aspirates are unacceptable for Xpert Xpress SARS-CoV-2/FLU/RSV testing.  Fact Sheet for Patients: BloggerCourse.com  Fact Sheet for Healthcare Providers: SeriousBroker.it  This test is not yet approved or cleared by the [place] and has been authorized for detection and/or diagnosis of SARS-CoV-2 by FDA under an Emergency Use Authorization (EUA). This EUA will remain in effect (meaning this test can be used) for the duration of the COVID-19 declaration under Section 564(b)(1) of the Act, 21 U.S.C. section 360bbb-3(b)(1), unless the authorization is terminated or revoked.     Resp Syncytial Virus by  PCR NEGATIVE NEGATIVE Final    Comment: (NOTE) Fact Sheet for Patients: EntrepreneurPulse.com.au  Fact Sheet for Healthcare Providers: IncredibleEmployment.be  This test is not yet approved or cleared by the [place] FDA and has been authorized for detection and/or diagnosis of SARS-CoV-2 by FDA under an Emergency Use Authorization (EUA). This EUA will remain in effect (meaning this test can be used) for the duration of the COVID-19 declaration under Section 564(b)(1) of the Act, 21 U.S.C. section 360bbb-3(b)(1), unless the authorization is terminated or revoked.  Performed at [facility], [address] [place], [place] [address]   MRSA Next Gen by PCR, Nasal     Status: Abnormal   Collection Time: 04/19/22  5:12 PM   Specimen: Nasal Mucosa; Nasal Swab  Result Value Ref Range Status   MRSA by PCR Next Gen DETECTED (A) NOT DETECTED Final    Comment: RESULT CALLED TO, READ BACK BY AND VERIFIED WITH: Belinda Fisher RN @ [phone]  04/20/22  R.Byrd Performed at [facility], [address] [place], [place] [address]      Discharge Instructions:   Discharge Instructions     Diet general   Complete by: As directed    Dysphagia I   Discharge instructions   Complete by: As directed    Further care as per hospice      Allergies as of 04/29/2022   No Known Allergies      Medication List     STOP taking these medications    apixaban 2.5 MG Tabs tablet Commonly known as: ELIQUIS   aspirin 81 MG chewable tablet   B-complex with vitamin C tablet   cefdinir 300 MG capsule Commonly known as: OMNICEF   Chlorhexidine Gluconate Cloth 2 % Pads   feeding supplement Liqd   Gerhardt's butt cream Crea   megestrol 400 MG/10ML suspension Commonly known as: MEGACE   metoprolol tartrate 25 MG tablet Commonly known as: LOPRESSOR   mirtazapine 7.5 MG tablet Commonly known as: REMERON   molnupiravir EUA 200 MG Caps capsule Commonly known  as: LAGEVRIO   mouth rinse Liqd solution   Nutritional Drink Liqd   polyethylene glycol 17 g packet Commonly known as: MIRALAX / GLYCOLAX       TAKE these medications    acetaminophen 325 MG tablet Commonly known as: TYLENOL Place 1-2 tablets (325-650 mg total) into feeding tube every 4 (four) hours as needed for mild pain. What changed:  how much to take how to take this   alum & mag hydroxide-simeth 200-200-20 MG/5ML suspension Commonly known as: MAALOX/MYLANTA Take 30 mLs by mouth every 4 (four) hours as needed for indigestion. What changed:  when to take this additional instructions Another medication with the same name was removed. Continue taking this medication, and follow the directions you see here.   QUEtiapine 50 MG tablet Commonly known as: SEROQUEL Take 1 tablet (50 mg total) by mouth every 8 (eight) hours as needed (agitation).   tamsulosin 0.4 MG Caps capsule Commonly known as: FLOMAX Take 1 capsule (0.4 mg total)  by mouth daily after supper. What changed: additional instructions          Time coordinating discharge: 39 minutes  Signed:  Alexandr Yaworski  [facility] 04/29/2022, 10:17 AM

## 2022-04-27 LAB — GLUCOSE, CAPILLARY
Glucose-Capillary: 115 mg/dL — ABNORMAL HIGH (ref 70–99)
Glucose-Capillary: 102 mg/dL — ABNORMAL HIGH (ref 70–99)
Glucose-Capillary: 102 mg/dL — ABNORMAL HIGH (ref 70–99)
Glucose-Capillary: 92 mg/dL (ref 70–99)

## 2022-04-27 NOTE — Progress Notes (Signed)
CSW spoke to [facility] with [facility]. At this time they are still attempting to get pts consents signed. Dominic Smith will update this CSW when able. TOC to follow.

## 2022-04-27 NOTE — Progress Notes (Signed)
PROGRESS NOTE    Dominic Smith  QXI:[id number] DOB: 1941/01/10 DOA: 04/19/2022 PCP: Sherol Dade, DO   Brief Narrative:  This 82 yo male with past medical history of traumatic brain injury with subarachnoid hemorrhage,  intraventricular hemorrhage, urinary retention,  Foley dependent with recent CAUTI, ongoing dysphagia ( on dys 3 diet), mild/mod AV stenosis, severe protein calorie malnutrition presented to the hospital with altered mental status.  Of note patient was recently admitted to hospital between 04/01/22 - 04/05/22 for sepsis due to UTI (Kleb), hypernatremia, encephalopathy and was discharged back to SNF. Patient again presented on 12/26 with recurrent (and even worse) hypernatremia and worsened renal function. Reportedly tested positive for covid 12/18, cannot tell if he was treated.  In the ED, patient was severely cachectic with unintelligible speech was not able to answer questions or follow commands. Initial sodium level was 161 with creatinine of 3.3 from baseline 1.3 on 04/05/2022.  WBC was elevated at 11.4.  COVID was positive with CT value of 24.3. Patient was then admitted to the hospital for resuscitation, goals of care discussion. During hospitalization, palliative care followed the patient and patient was considered for hospice.     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  Baseline creatinine around 1.8-1.9.   Presenting creatinine of 3.3 from initial presentation.  Likely poor oral intake.   Continue Foley catheter for history of urinary retention and comfort..     Renal ultrasound did not show any acute findings.  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  Awaiting for hospice placement..   Patient with poor oral safety dysphagia and recurrent hypernatremia.   Dysphagia, poor oral safety.  Unable to swallow pills  by mouth.    Hypernatremia: D5 water during hospitalization.   Likely to be a recurrent issue once D5 is discontinued.   Mild hypokalemia.   Received potassium supplement during hospitalization.   COVID-19 virus infection: Likely contributing to worsening condition but has baseline failure to thrive.   Received remdesivir during hospitalization   Urinary retention: -Recent urology evaluation on 04/11/2022 and failed TOV.  Continue Foley catheter for comfort and urinary retention.   Protein-calorie malnutrition, severe Speech therapy had recommended dysphagia 1 diet.  Unsafe for oral at this time.  Continue IV fluids.   Deconditioning, debility, ongoing decline.  Plan for hospice on discharge.   DVT prophylaxis:  None Code Status: DNR Family Communication: No family at bed side. Disposition Plan:   Patient is being discharged to hospice facility.  Consultants:  None  Procedures: None  Antimicrobials: None  Subjective: Patient was seen and examined at bedside.  Patient remains nonverbal.  Not following commands.  He is arousable but remains not communicative.  Objective: Vitals:   04/26/22 0503 04/26/22 1309 04/26/22 2052 04/27/22 0400  BP: 123/87 125/76 (!) 156/74 134/70  Pulse: 96 95 (!) 102 98  Resp: 18  20 16   Temp: 98 F (36.7 C) (!) 97.4 F (36.3 C) 98.9 F (37.2 C) 97.8 F (36.6 C)  TempSrc:  Oral    SpO2: 100% 100% 100% 98%  Weight:      Height:        Intake/Output  Summary (Last 24 hours) at 04/27/2022 1059 Last data filed at 04/27/2022 0510 Gross per 24 hour  Intake 1824.18 ml  Output 1925 ml  Net -100.82 ml   Filed Weights   04/19/22 1726 04/20/22 0500 04/22/22 0500  Weight: 44.7 kg 44.9 kg 46.8 kg    Examination:  General exam: Appears  deconditioned, nonverbal, not communicative, Cachectic. Respiratory system: CTA bilaterally, respiratory effort normal, RR 15. Cardiovascular system: S1 & S2 heard, regular rate and rhythm, murmur+ Gastrointestinal system: Abdomen is soft, non tender, nondistended, BS+ Central nervous system: Not communicative, confused, arousable. Extremities: No edema, no cyanosis, no clubbing. Skin: No rashes, lesions or ulcers Psychiatry: Not assessed.   Data Reviewed: I have personally reviewed following labs and imaging studies  CBC: Recent Labs  Lab 04/21/22 0453 04/22/22 0335 04/22/22 1359 04/23/22 1132  WBC 11.0* 13.5* 11.4* 9.5  NEUTROABS 9.2* 11.2*  --   --   HGB 11.3* 11.4* 10.9* 11.5*  HCT 37.3* 36.7* 34.6* 36.2*  MCV 90.5 90.0 88.9 88.3  PLT 185 179 181 045   Basic Metabolic Panel: Recent Labs  Lab 04/21/22 0453 04/22/22 0335 04/22/22 1131 04/23/22 0615 04/24/22 0534  NA 164*  --  152* 149* 141  K 3.3*  --  3.2* 3.2* 3.4*  CL >130*  --  127* 126* 118*  CO2 24  --  23 19* 20*  GLUCOSE 84  --  110* 105* 92  BUN 61*  --  44* 34* 27*  CREATININE 2.58*  --  2.10* 1.68* 1.64*  CALCIUM 8.8*  --  8.3* 8.3* 8.2*  MG 2.4 2.3  --  2.3  --    GFR: Estimated Creatinine Clearance: 23 mL/min (A) (by C-G formula based on SCr of 1.64 mg/dL (H)). Liver Function Tests: No results for input(s): "AST", "ALT", "ALKPHOS", "BILITOT", "PROT", "ALBUMIN" in the last 168 hours. No results for input(s): "LIPASE", "AMYLASE" in the last 168 hours. No results for input(s): "AMMONIA" in the last 168 hours. Coagulation Profile: No results for input(s): "INR", "PROTIME" in the last 168 hours. Cardiac Enzymes: No results for input(s): "CKTOTAL", "CKMB", "CKMBINDEX", "TROPONINI" in the last 168 hours. BNP (last 3 results) No results for input(s): "PROBNP" in the last 8760 hours. HbA1C: No results for input(s): "HGBA1C" in the last 72 hours. CBG: Recent Labs  Lab 04/26/22 0735 04/26/22 1146  04/26/22 1640 04/26/22 2107 04/27/22 0806  GLUCAP 89 101* 95 110* 115*   Lipid Profile: No results for input(s): "CHOL", "HDL", "LDLCALC", "TRIG", "CHOLHDL", "LDLDIRECT" in the last 72 hours. Thyroid Function Tests: No results for input(s): "TSH", "T4TOTAL", "FREET4", "T3FREE", "THYROIDAB" in the last 72 hours. Anemia Panel: No results for input(s): "VITAMINB12", "FOLATE", "FERRITIN", "TIBC", "IRON", "RETICCTPCT" in the last 72 hours. Sepsis Labs: No results for input(s): "PROCALCITON", "LATICACIDVEN" in the last 168 hours.  Recent Results (from the past 240 hour(s))  Resp panel by RT-PCR (RSV, Flu A&B, Covid) Anterior Nasal Swab     Status: Abnormal   Collection Time: 04/19/22  1:00 PM   Specimen: Anterior Nasal Swab  Result Value Ref Range Status   SARS Coronavirus 2 by RT PCR POSITIVE (A) NEGATIVE Final    Comment: (NOTE) SARS-CoV-2 target nucleic acids are DETECTED.  The SARS-CoV-2 RNA is generally detectable in upper respiratory specimens during the acute phase of infection. Positive results are indicative of the presence of the identified virus, but do not rule out bacterial infection or co-infection with other pathogens not detected by the test. Clinical correlation  with patient history and other diagnostic information is necessary to determine patient infection status. The expected result is Negative.  Fact Sheet for Patients: EntrepreneurPulse.com.au  Fact Sheet for Healthcare Providers: IncredibleEmployment.be  This test is not yet approved or cleared by the [place] FDA and  has been authorized for detection and/or diagnosis of SARS-CoV-2 by FDA under an Emergency Use Authorization (EUA).  This EUA will remain in effect (meaning this test can be used) for the duration of  the COVID-19 declaration under Section 564(b)(1) of the A ct, 21 U.S.C. section 360bbb-3(b)(1), unless the authorization is terminated or revoked  sooner.     Influenza A by PCR NEGATIVE NEGATIVE Final   Influenza B by PCR NEGATIVE NEGATIVE Final    Comment: (NOTE) The Xpert Xpress SARS-CoV-2/FLU/RSV plus assay is intended as an aid in the diagnosis of influenza from Nasopharyngeal swab specimens and should not be used as a sole basis for treatment. Nasal washings and aspirates are unacceptable for Xpert Xpress SARS-CoV-2/FLU/RSV testing.  Fact Sheet for Patients: EntrepreneurPulse.com.au  Fact Sheet for Healthcare Providers: IncredibleEmployment.be  This test is not yet approved or cleared by the [place] FDA and has been authorized for detection and/or diagnosis of SARS-CoV-2 by FDA under an Emergency Use Authorization (EUA). This EUA will remain in effect (meaning this test can be used) for the duration of the COVID-19 declaration under Section 564(b)(1) of the Act, 21 U.S.C. section 360bbb-3(b)(1), unless the authorization is terminated or revoked.     Resp Syncytial Virus by PCR NEGATIVE NEGATIVE Final    Comment: (NOTE) Fact Sheet for Patients: EntrepreneurPulse.com.au  Fact Sheet for Healthcare Providers: IncredibleEmployment.be  This test is not yet approved or cleared by the [place] FDA and has been authorized for detection and/or diagnosis of SARS-CoV-2 by FDA under an Emergency Use Authorization (EUA). This EUA will remain in effect (meaning this test can be used) for the duration of the COVID-19 declaration under Section 564(b)(1) of the Act, 21 U.S.C. section 360bbb-3(b)(1), unless the authorization is terminated or revoked.  Performed at [facility], [address], [place], [place] [address]   MRSA Next Gen by PCR, Nasal     Status: Abnormal   Collection Time: 04/19/22  5:12 PM   Specimen: Nasal Mucosa; Nasal Swab  Result Value Ref Range Status   MRSA by PCR Next Gen DETECTED (A) NOT DETECTED Final    Comment: RESULT  CALLED TO, READ BACK BY AND VERIFIED WITH: Belinda Fisher RN @ [phone]  04/20/22  R.Byrd Performed at [facility], [address], [place], [place] [address]      Radiology Studies: No results found.  Scheduled Meds:  Chlorhexidine Gluconate Cloth  6 each Topical Daily   enoxaparin (LOVENOX) injection  30 mg Subcutaneous Q24H   mupirocin ointment   Nasal BID   Continuous Infusions:  dextrose 5 % with KCl 20 mEq / L 100 mL/hr at 04/27/22 0500     LOS: 8 days    Time spent: 35 mins    Willy Pinkerton, MD [facility]   If 7PM-7AM, please contact night-coverage

## 2022-04-28 LAB — GLUCOSE, CAPILLARY
Glucose-Capillary: 102 mg/dL — ABNORMAL HIGH (ref 70–99)
Glucose-Capillary: 109 mg/dL — ABNORMAL HIGH (ref 70–99)
Glucose-Capillary: 142 mg/dL — ABNORMAL HIGH (ref 70–99)

## 2022-04-28 NOTE — Progress Notes (Signed)
PROGRESS NOTE    Dominic Smith  [id number] DOB: 02-02-1941 DOA: 04/19/2022 PCP: Hal Morales, DO   Brief Narrative:  This 82 yo male with past medical history of traumatic brain injury with subarachnoid hemorrhage,  intraventricular hemorrhage, urinary retention,  Foley dependent with recent CAUTI, ongoing dysphagia ( on dys 3 diet), mild/mod AV stenosis, severe protein calorie malnutrition presented to the hospital with altered mental status.  Of note patient was recently admitted to hospital between 04/01/22 - 04/05/22 for sepsis due to UTI (Kleb), hypernatremia, encephalopathy and was discharged back to SNF. Patient again presented on 12/26 with recurrent (and even worse) hypernatremia and worsened renal function. Reportedly tested positive for covid 12/18, cannot tell if he was treated.  In the ED, patient was severely cachectic with unintelligible speech was not able to answer questions or follow commands. Initial sodium level was 161 with creatinine of 3.3 from baseline 1.3 on 04/05/2022.  WBC was elevated at 11.4.  COVID was positive with CT value of 24.3. Patient was then admitted to the hospital for resuscitation, goals of care discussion. During hospitalization, palliative care followed the patient and patient was considered for hospice.     Assessment & Plan:   Principal Problem:   Acute renal failure superimposed on stage 3b chronic kidney disease (HCC) Active Problems:   Hypernatremia   TBI (traumatic brain injury) ([place])   COVID-19 virus infection   Protein-calorie malnutrition, severe   Urinary retention  AKI on CKD stage IIIb: Latest creatinine 1.6.  Baseline creatinine around 1.8-1.9.   Presenting creatinine of 3.3 from initial presentation.  Likely poor oral intake.   Continue Foley catheter for history of urinary retention and comfort..     Renal ultrasound did not show any acute findings.  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  Awaiting for hospice placement..   Patient with poor oral safety dysphagia and recurrent hypernatremia.   Dysphagia, poor oral safety.  Unable to swallow pills  by mouth.    Hypernatremia: D5 water during hospitalization.   Likely to be a recurrent issue once D5 is discontinued.   Mild hypokalemia.   Received potassium supplement during hospitalization.   COVID-19 virus infection: Likely contributing to worsening condition but has baseline failure to thrive.   Received remdesivir during hospitalization   Urinary retention: -Recent urology evaluation on 04/11/2022 and failed TOV.  Continue Foley catheter for comfort and urinary retention.   Protein-calorie malnutrition, severe Speech therapy had recommended dysphagia 1 diet.  Unsafe for oral at this time.  Continue IV fluids.   Deconditioning, debility, ongoing decline.  Plan for hospice on discharge.   DVT prophylaxis:  None Code Status: DNR Family Communication: No family at bed side. Disposition Plan:   Patient is being discharged to hospice facility.  Consultants:  None  Procedures: None  Antimicrobials: None  Subjective: Patient was seen and examined at bedside.  Patient remains nonverbal.  Not following commands.  He is arousable but remains noncommunicative.  Objective: Vitals:   04/27/22 0400 04/27/22 1541 04/27/22 2219 04/28/22 0316  BP: 134/70 130/69 (!) 140/78 124/69  Pulse: 98 95 91 90  Resp: 16 16 18 20   Temp: 97.8 F (36.6 C) 97.8 F (36.6 C) 98.9 F (37.2 C) 98.9 F (37.2 C)  TempSrc:  Oral    SpO2: 98% 100% 100% 100%  Weight:      Height:        Intake/Output Summary (Last 24  hours) at 04/28/2022 1110 Last data filed at 04/28/2022 0720 Gross per 24 hour  Intake 960.7 ml  Output 1750 ml  Net -789.3 ml   Filed Weights   04/19/22 1726 04/20/22 0500 04/22/22 0500  Weight: 44.7 kg 44.9 kg 46.8 kg    Examination:  General exam: Appears Cachectic,  chronically ill looking, nonverbal, noncommunicative. Respiratory system: CTA bilaterally, respiratory effort normal, RR 14. Cardiovascular system: S1 & S2 heard, regular rate and rhythm, murmur+ Gastrointestinal system: Abdomen is soft, non tender, nondistended, BS+ Central nervous system: Not communicative, confused, arousable. Extremities: No edema, no cyanosis, no clubbing. Skin: No rashes, lesions or ulcers Psychiatry: Not assessed.   Data Reviewed: I have personally reviewed following labs and imaging studies  CBC: Recent Labs  Lab 04/22/22 0335 04/22/22 1359 04/23/22 1132  WBC 13.5* 11.4* 9.5  NEUTROABS 11.2*  --   --   HGB 11.4* 10.9* 11.5*  HCT 36.7* 34.6* 36.2*  MCV 90.0 88.9 88.3  PLT 179 181 809   Basic Metabolic Panel: Recent Labs  Lab 04/22/22 0335 04/22/22 1131 04/23/22 0615 04/24/22 0534  NA  --  152* 149* 141  K  --  3.2* 3.2* 3.4*  CL  --  127* 126* 118*  CO2  --  23 19* 20*  GLUCOSE  --  110* 105* 92  BUN  --  44* 34* 27*  CREATININE  --  2.10* 1.68* 1.64*  CALCIUM  --  8.3* 8.3* 8.2*  MG 2.3  --  2.3  --    GFR: Estimated Creatinine Clearance: 23 mL/min (A) (by C-G formula based on SCr of 1.64 mg/dL (H)). Liver Function Tests: No results for input(s): "AST", "ALT", "ALKPHOS", "BILITOT", "PROT", "ALBUMIN" in the last 168 hours. No results for input(s): "LIPASE", "AMYLASE" in the last 168 hours. No results for input(s): "AMMONIA" in the last 168 hours. Coagulation Profile: No results for input(s): "INR", "PROTIME" in the last 168 hours. Cardiac Enzymes: No results for input(s): "CKTOTAL", "CKMB", "CKMBINDEX", "TROPONINI" in the last 168 hours. BNP (last 3 results) No results for input(s): "PROBNP" in the last 8760 hours. HbA1C: No results for input(s): "HGBA1C" in the last 72 hours. CBG: Recent Labs  Lab 04/27/22 0806 04/27/22 1100 04/27/22 1645 04/27/22 2228 04/28/22 0738  GLUCAP 115* 102* 102* 92 102*   Lipid Profile: No results  for input(s): "CHOL", "HDL", "LDLCALC", "TRIG", "CHOLHDL", "LDLDIRECT" in the last 72 hours. Thyroid Function Tests: No results for input(s): "TSH", "T4TOTAL", "FREET4", "T3FREE", "THYROIDAB" in the last 72 hours. Anemia Panel: No results for input(s): "VITAMINB12", "FOLATE", "FERRITIN", "TIBC", "IRON", "RETICCTPCT" in the last 72 hours. Sepsis Labs: No results for input(s): "PROCALCITON", "LATICACIDVEN" in the last 168 hours.  Recent Results (from the past 240 hour(s))  Resp panel by RT-PCR (RSV, Flu A&B, Covid) Anterior Nasal Swab     Status: Abnormal   Collection Time: 04/19/22  1:00 PM   Specimen: Anterior Nasal Swab  Result Value Ref Range Status   SARS Coronavirus 2 by RT PCR POSITIVE (A) NEGATIVE Final    Comment: (NOTE) SARS-CoV-2 target nucleic acids are DETECTED.  The SARS-CoV-2 RNA is generally detectable in upper respiratory specimens during the acute phase of infection. Positive results are indicative of the presence of the identified virus, but do not rule out bacterial infection or co-infection with other pathogens not detected by the test. Clinical correlation with patient history and other diagnostic information is necessary to determine patient infection status. The expected result is Negative.  Fact  Sheet for Patients: EntrepreneurPulse.com.au  Fact Sheet for Healthcare Providers: IncredibleEmployment.be  This test is not yet approved or cleared by the [place] FDA and  has been authorized for detection and/or diagnosis of SARS-CoV-2 by FDA under an Emergency Use Authorization (EUA).  This EUA will remain in effect (meaning this test can be used) for the duration of  the COVID-19 declaration under Section 564(b)(1) of the A ct, 21 U.S.C. section 360bbb-3(b)(1), unless the authorization is terminated or revoked sooner.     Influenza A by PCR NEGATIVE NEGATIVE Final   Influenza B by PCR NEGATIVE NEGATIVE Final     Comment: (NOTE) The Xpert Xpress SARS-CoV-2/FLU/RSV plus assay is intended as an aid in the diagnosis of influenza from Nasopharyngeal swab specimens and should not be used as a sole basis for treatment. Nasal washings and aspirates are unacceptable for Xpert Xpress SARS-CoV-2/FLU/RSV testing.  Fact Sheet for Patients: EntrepreneurPulse.com.au  Fact Sheet for Healthcare Providers: IncredibleEmployment.be  This test is not yet approved or cleared by the [place] FDA and has been authorized for detection and/or diagnosis of SARS-CoV-2 by FDA under an Emergency Use Authorization (EUA). This EUA will remain in effect (meaning this test can be used) for the duration of the COVID-19 declaration under Section 564(b)(1) of the Act, 21 U.S.C. section 360bbb-3(b)(1), unless the authorization is terminated or revoked.     Resp Syncytial Virus by PCR NEGATIVE NEGATIVE Final    Comment: (NOTE) Fact Sheet for Patients: EntrepreneurPulse.com.au  Fact Sheet for Healthcare Providers: IncredibleEmployment.be  This test is not yet approved or cleared by the [place] FDA and has been authorized for detection and/or diagnosis of SARS-CoV-2 by FDA under an Emergency Use Authorization (EUA). This EUA will remain in effect (meaning this test can be used) for the duration of the COVID-19 declaration under Section 564(b)(1) of the Act, 21 U.S.C. section 360bbb-3(b)(1), unless the authorization is terminated or revoked.  Performed at [facility], [address] [place], [place] [address]   MRSA Next Gen by PCR, Nasal     Status: Abnormal   Collection Time: 04/19/22  5:12 PM   Specimen: Nasal Mucosa; Nasal Swab  Result Value Ref Range Status   MRSA by PCR Next Gen DETECTED (A) NOT DETECTED Final    Comment: RESULT CALLED TO, READ BACK BY AND VERIFIED WITH: Belinda Fisher RN @ [phone]  04/20/22  R.Byrd Performed at [facility], [address] [place], [place] [address]      Radiology Studies: No results found.  Scheduled Meds:  Chlorhexidine Gluconate Cloth  6 each Topical Daily   enoxaparin (LOVENOX) injection  30 mg Subcutaneous Q24H   mupirocin ointment   Nasal BID   Continuous Infusions:  dextrose 5 % with KCl 20 mEq / L 20 mEq (04/28/22 0308)     LOS: 9 days    Time spent: 35 mins    Drishti Pepperman, MD [facility]   If 7PM-7AM, please contact night-coverage

## 2022-04-28 NOTE — TOC Progression Note (Signed)
Transition of Care [facility] - Progression Note    Patient Details  Name: Dominic Smith MRN: [id number] Date of Birth: 1940-06-02  Transition of Care [facility]) CM/SW [place], [place] Phone Number: 04/28/2022, 10:19 AM  Clinical Narrative:    CSW updated by Luetta Nutting with [place] who states she has been able to get in touch with pts ex wife [name]. Amber states that [name] is going to speak with their financial office today and then [employer] will provide CSW with update when consents have been signed.   Expected Discharge Plan: Hospice Medical Facility Barriers to Discharge: Other (must enter comment) (Hospice attempting to get consents signed)  Expected Discharge Plan and Services         Expected Discharge Date: 04/26/22                                     Social Determinants of Health ([place]) Interventions [place]: Low Risk  (01/15/2022)  Tobacco Use: Low Risk  (04/19/2022)    Readmission Risk Interventions    04/04/2022    1:40 PM  Readmission Risk Prevention Plan  Transportation Screening Complete  HRI or [place] Complete  Social Work Consult for [place] Planning/Counseling Complete  Palliative Care Screening Not Applicable  Medication Review [employer] photographer) Complete

## 2022-04-29 NOTE — Progress Notes (Signed)
PROGRESS NOTE    Dominic Smith  YKD:[id number] DOB: [birth year] DOA: 04/19/2022  PCP: Sherol Dade, DO   Brief Narrative:  This 82 yo male with past medical history of traumatic brain injury with subarachnoid hemorrhage,  intraventricular hemorrhage, urinary retention,  Foley dependent with recent CAUTI, ongoing dysphagia ( on dys 3 diet), mild/mod AV stenosis, severe protein calorie malnutrition presented to the hospital with altered mental status.  Of note patient was recently admitted to hospital between 04/01/22 - 04/05/22 for sepsis due to UTI ([place]), hypernatremia, encephalopathy and was discharged back to SNF. Patient again presented on 12/26 with recurrent (and even worse) hypernatremia and worsened renal function. Reportedly tested positive for covid 12/18, cannot tell if he was treated.  In the ED, patient was severely cachectic with unintelligible speech was not able to answer questions or follow commands. Initial sodium level was 161 with creatinine of 3.3 from baseline 1.3 on 04/05/2022.  WBC was elevated at 11.4. COVID was positive with CT value of 24.3. Patient was then admitted to the hospital for resuscitation, goals of care discussion. During hospitalization, palliative care followed the patient and patient was considered for hospice.   Patient is being discharged to hospice facility today.  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  Baseline creatinine around 1.8-1.9.   Presenting creatinine of 3.3 from initial presentation.  Likely poor oral intake.   Continue Foley catheter for history of urinary retention and comfort..     Renal ultrasound did not show any acute findings.  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  Awaiting for hospice placement..   Patient with poor oral safety dysphagia and recurrent hypernatremia.   Dysphagia, poor oral safety.  Unable to swallow pills  by mouth.    Hypernatremia: D5 water during hospitalization.   Likely to be a recurrent issue once D5 is discontinued.   Mild hypokalemia.   Received potassium supplement during hospitalization.   COVID-19 virus infection: Likely contributing to worsening condition but has baseline failure to thrive.   Received remdesivir during hospitalization   Urinary retention: -Recent urology evaluation on 04/11/2022 and failed TOV.  Continue Foley catheter for comfort and urinary retention.   Protein-calorie malnutrition, severe Speech therapy had recommended dysphagia 1 diet.  Unsafe for oral at this time.  Continue IV fluids.   Deconditioning, debility, ongoing decline.  Plan for hospice on discharge.   DVT prophylaxis:  None Code Status: DNR Family Communication: No family at bed side. Disposition Plan:   Patient is being discharged to hospice facility today.  Consultants:  None  Procedures: None  Antimicrobials: None  Subjective: Patient was seen and examined at bedside.  Overnight events noted.  Patient remains nonverbal.  Not following commands.  He is arousable but remains noncommunicative.  Objective: Vitals:   04/28/22 0316 04/28/22 1300 04/28/22 2006 04/29/22 0542  BP: 124/69 (!) 135/92 (!) 165/80 (!) 172/82  Pulse: 90 91 95 99  Resp: 20 18 18 20   Temp: 98.9 F (37.2 C) 98.3 F (36.8 C) 97.9 F (36.6 C) 97.7 F (36.5 C)  TempSrc:  Axillary Axillary Oral  SpO2: 100% 100% 100% 100%  Weight:  Height:        Intake/Output Summary (Last 24 hours) at 04/29/2022 1257 Last data filed at 04/29/2022 0900 Gross per 24 hour  Intake 1200 ml  Output 400 ml  Net 800 ml   Filed Weights   04/19/22 1726 04/20/22 0500  04/22/22 0500  Weight: 44.7 kg 44.9 kg 46.8 kg    Examination:  General exam: Appears Cachectic, chronically ill looking, nonverbal, noncommunicative. Respiratory system: CTA bilaterally, respiratory effort normal, RR 14. Cardiovascular system: S1 & S2 heard, regular rate and rhythm, murmur+ Gastrointestinal system: Abdomen is soft, non tender, nondistended, BS+ Central nervous system: Not communicative, confused, arousable. Extremities: No edema, no cyanosis, no clubbing. Skin: No rashes, lesions or ulcers Psychiatry: Not assessed.   Data Reviewed: I have personally reviewed following labs and imaging studies  CBC: Recent Labs  Lab 04/22/22 1359 04/23/22 1132  WBC 11.4* 9.5  HGB 10.9* 11.5*  HCT 34.6* 36.2*  MCV 88.9 88.3  PLT 181 161   Basic Metabolic Panel: Recent Labs  Lab 04/23/22 0615 04/24/22 0534  NA 149* 141  K 3.2* 3.4*  CL 126* 118*  CO2 19* 20*  GLUCOSE 105* 92  BUN 34* 27*  CREATININE 1.68* 1.64*  CALCIUM 8.3* 8.2*  MG 2.3  --    GFR: Estimated Creatinine Clearance: 23 mL/min (A) (by C-G formula based on SCr of 1.64 mg/dL (H)). Liver Function Tests: No results for input(s): "AST", "ALT", "ALKPHOS", "BILITOT", "PROT", "ALBUMIN" in the last 168 hours. No results for input(s): "LIPASE", "AMYLASE" in the last 168 hours. No results for input(s): "AMMONIA" in the last 168 hours. Coagulation Profile: No results for input(s): "INR", "PROTIME" in the last 168 hours. Cardiac Enzymes: No results for input(s): "CKTOTAL", "CKMB", "CKMBINDEX", "TROPONINI" in the last 168 hours. BNP (last 3 results) No results for input(s): "PROBNP" in the last 8760 hours. HbA1C: No results for input(s): "HGBA1C" in the last 72 hours. CBG: Recent Labs  Lab 04/27/22 1645 04/27/22 2228 04/28/22 0738 04/28/22 1112 04/28/22 1623  GLUCAP 102* 92 102* 109* 142*   Lipid Profile: No results for input(s): "CHOL", "HDL", "LDLCALC", "TRIG", "CHOLHDL", "LDLDIRECT" in the last 72  hours. Thyroid Function Tests: No results for input(s): "TSH", "T4TOTAL", "FREET4", "T3FREE", "THYROIDAB" in the last 72 hours. Anemia Panel: No results for input(s): "VITAMINB12", "FOLATE", "FERRITIN", "TIBC", "IRON", "RETICCTPCT" in the last 72 hours. Sepsis Labs: No results for input(s): "PROCALCITON", "LATICACIDVEN" in the last 168 hours.  Recent Results (from the past 240 hour(s))  Resp panel by RT-PCR (RSV, Flu A&B, Covid) Anterior Nasal Swab     Status: Abnormal   Collection Time: 04/19/22  1:00 PM   Specimen: Anterior Nasal Swab  Result Value Ref Range Status   SARS Coronavirus 2 by RT PCR POSITIVE (A) NEGATIVE Final    Comment: (NOTE) SARS-CoV-2 target nucleic acids are DETECTED.  The SARS-CoV-2 RNA is generally detectable in upper respiratory specimens during the acute phase of infection. Positive results are indicative of the presence of the identified virus, but do not rule out bacterial infection or co-infection with other pathogens not detected by the test. Clinical correlation with patient history and other diagnostic information is necessary to determine patient infection status. The expected result is Negative.  Fact Sheet for Patients: EntrepreneurPulse.com.au  Fact Sheet for Healthcare Providers: IncredibleEmployment.be  This test is not yet approved or cleared by the [place] FDA and  has been authorized for detection and/or diagnosis of SARS-CoV-2 by FDA under an Emergency Use Authorization (EUA).  This EUA will remain in effect (meaning this test can be used) for the duration of  the COVID-19 declaration under Section 564(b)(1) of the A ct, 21 U.S.C. section 360bbb-3(b)(1), unless the authorization is terminated or revoked sooner.     Influenza A by PCR NEGATIVE NEGATIVE Final   Influenza B by PCR NEGATIVE NEGATIVE Final    Comment: (NOTE) The Xpert Xpress SARS-CoV-2/FLU/RSV plus assay is intended as an aid in  the diagnosis of influenza from Nasopharyngeal swab specimens and should not be used as a sole basis for treatment. Nasal washings and aspirates are unacceptable for Xpert Xpress SARS-CoV-2/FLU/RSV testing.  Fact Sheet for Patients: EntrepreneurPulse.com.au  Fact Sheet for Healthcare Providers: IncredibleEmployment.be  This test is not yet approved or cleared by the [place] FDA and has been authorized for detection and/or diagnosis of SARS-CoV-2 by FDA under an Emergency Use Authorization (EUA). This EUA will remain in effect (meaning this test can be used) for the duration of the COVID-19 declaration under Section 564(b)(1) of the Act, 21 U.S.C. section 360bbb-3(b)(1), unless the authorization is terminated or revoked.     Resp Syncytial Virus by PCR NEGATIVE NEGATIVE Final    Comment: (NOTE) Fact Sheet for Patients: EntrepreneurPulse.com.au  Fact Sheet for Healthcare Providers: IncredibleEmployment.be  This test is not yet approved or cleared by the [place] FDA and has been authorized for detection and/or diagnosis of SARS-CoV-2 by FDA under an Emergency Use Authorization (EUA). This EUA will remain in effect (meaning this test can be used) for the duration of the COVID-19 declaration under Section 564(b)(1) of the Act, 21 U.S.C. section 360bbb-3(b)(1), unless the authorization is terminated or revoked.  Performed at [facility], [address], [place], [place] [address]   MRSA Next Gen by PCR, Nasal     Status: Abnormal   Collection Time: 04/19/22  5:12 PM   Specimen: Nasal Mucosa; Nasal Swab  Result Value Ref Range Status   MRSA by PCR Next Gen DETECTED (A) NOT DETECTED Final    Comment: RESULT CALLED TO, READ BACK BY AND VERIFIED WITH: Belinda Fisher RN @ [phone]  04/20/22  R.Byrd Performed at [facility], [address], [place], [place] [address]      Radiology Studies: No results  found.  Scheduled Meds:  Chlorhexidine Gluconate Cloth  6 each Topical Daily   enoxaparin (LOVENOX) injection  30 mg Subcutaneous Q24H   mupirocin ointment   Nasal BID   Continuous Infusions:  dextrose 5 % with KCl 20 mEq / L 20 mEq (04/28/22 2354)     LOS: 10 days    Time spent: 35 mins    Perry Molla, MD [facility]   If 7PM-7AM, please contact night-coverage

## 2022-04-29 NOTE — TOC Transition Note (Signed)
Transition of Care [facility]) - CM/SW Discharge Note   Patient Details  Name: Muzammil Bruins MRN: [id number] Date of Birth: 01-13-1941  Transition of Care [employer]) CM/SW Contact:  Iona Beard, [place] Phone Number: 04/29/2022, 10:04 AM   Clinical Narrative:    CSW updated by Luetta Nutting with [facility] that consents have been signed. CSW updated RN and MD of this. Amber placing pt on EMS list at this time. TOC signing off.   Final next level of care: Hospice Medical Facility Barriers to Discharge: Other (must enter comment) (Hospice once consents have been signed)   Patient Goals and CMS Choice CMS Medicare.gov Compare Post Acute Care list provided to:: Patient Represenative (must comment) Choice offered to / list presented to : Spouse  Discharge Placement                  Patient to be transferred to facility by: EMS      Discharge Plan and Services Additional resources added to the After Visit Summary for                                       Social Determinants of Health (SDOH) Interventions Rafael Gonzalez: Low Risk  (01/15/2022)  Tobacco Use: Low Risk  (04/19/2022)     Readmission Risk Interventions    04/04/2022    1:40 PM  Readmission Risk Prevention Plan  Transportation Screening Complete  HRI or [place] Complete  Social Work Consult for [place] Planning/Counseling Complete  Palliative Care Screening Not Applicable  Medication Review [employer]) Complete

## 2022-05-04 ENCOUNTER — Encounter: Payer: Self-pay | Admitting: Physical Medicine & Rehabilitation

## 2022-05-06 ENCOUNTER — Other Ambulatory Visit (HOSPITAL_COMMUNITY): Payer: Self-pay

## 2022-05-26 DEATH — deceased

## 2022-05-30 ENCOUNTER — Ambulatory Visit: Payer: Self-pay | Admitting: Urology

## 2023-10-22 IMAGING — CT CT CERVICAL SPINE W/O CM
3 of 4 series · 12 of 33 positions shown, 14 images · non-contrast
Comparison: None.

CLINICAL DATA: Found on the ground at [employer] parking lot.



[Series 3: sagittal bone · sagittal · 0.29mm/px · 5 of 51 slices shown, 6 images]
[im 17/51  bone]
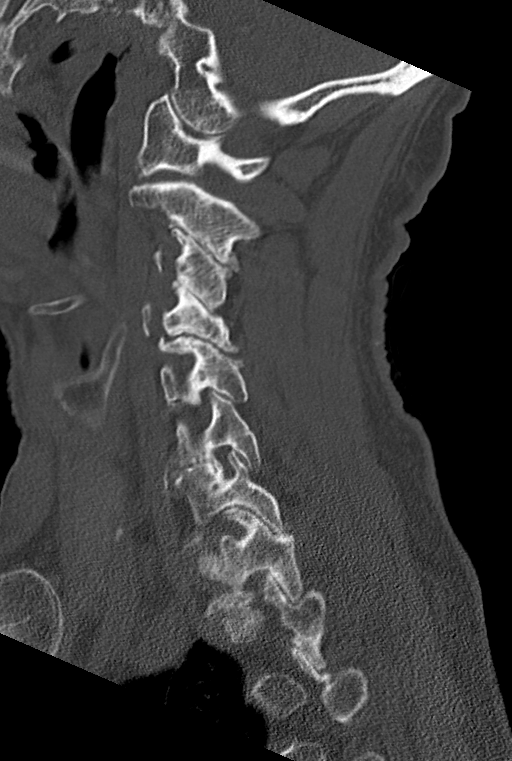
[im 21/51  bone]
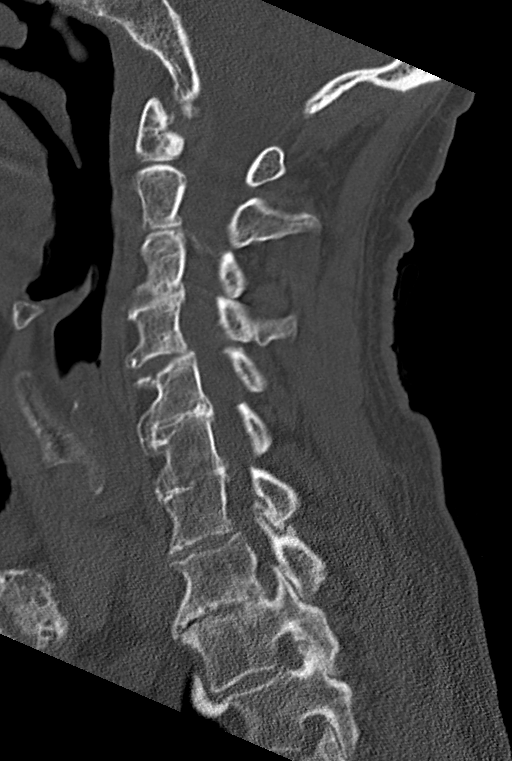
[im 26/51  soft-tissue]
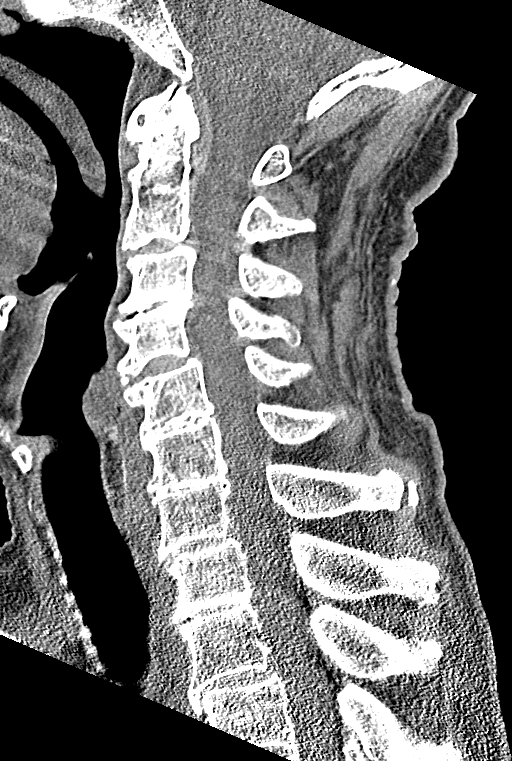
[im 26/51  bone]
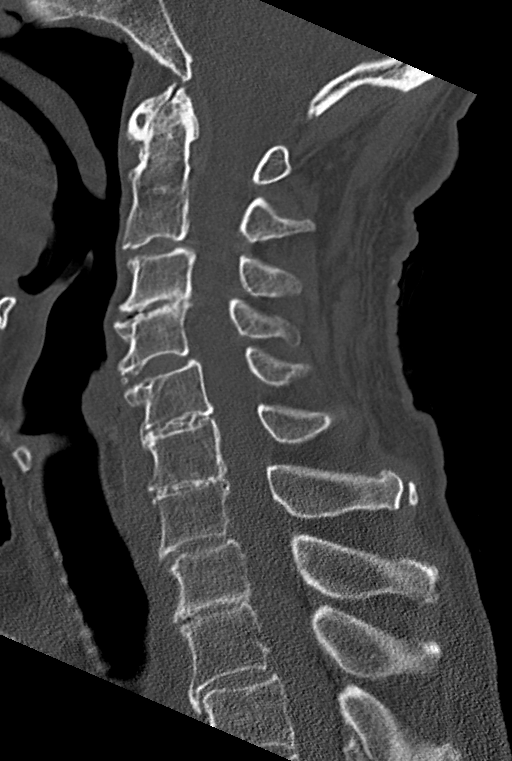
[im 30/51  bone]
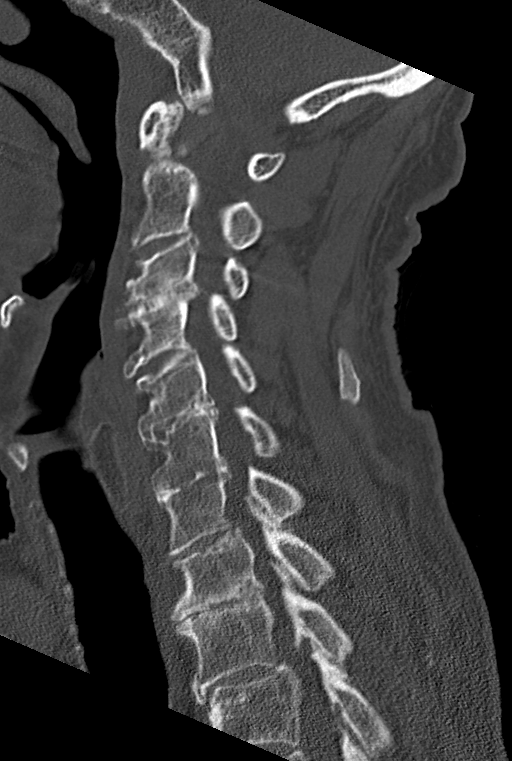
[im 34/51  bone]
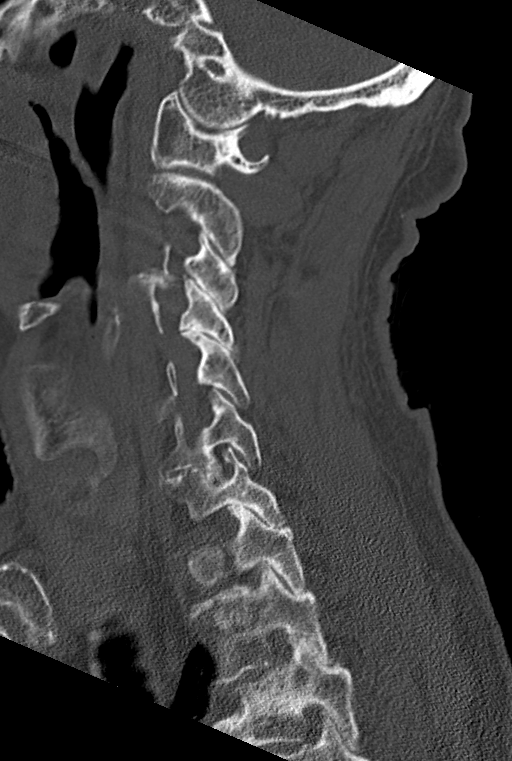

[Series 6: orthogonal bone · axial · 0.21mm/px · z∈[+1262,+1380]mm · 4 of 104 slices shown, 5 images]
[im 18/104  soft-tissue]
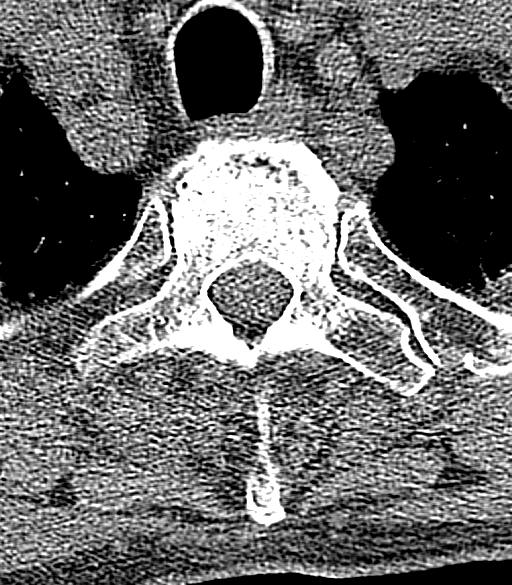
[im 18/104  bone]
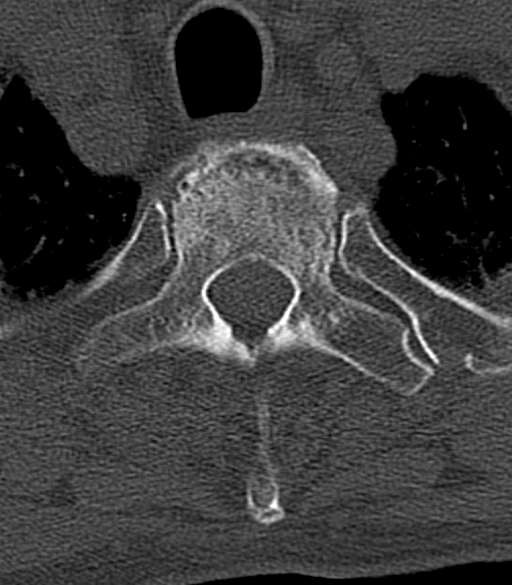
[im 35/104  bone]
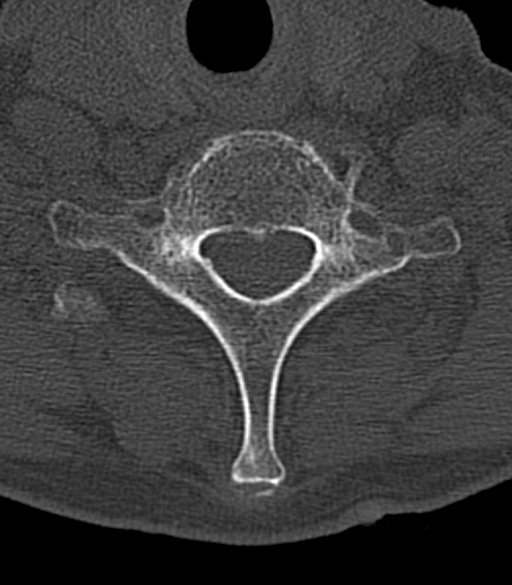
[im 69/104  bone]
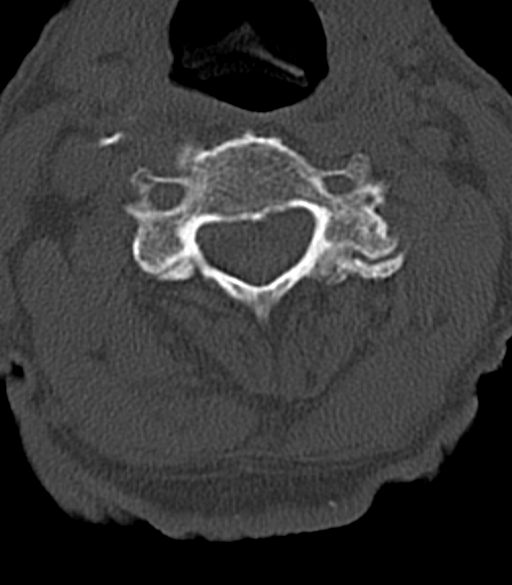
[im 86/104  bone]
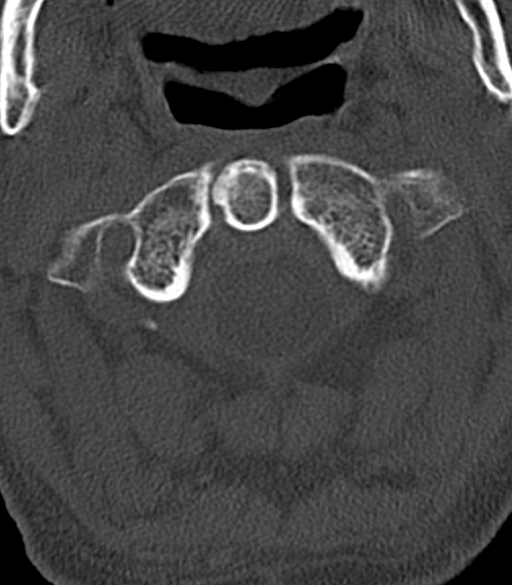

[Series 7: coronal bone · coronal · 0.29mm/px · 3 of 52 slices shown]
[im 11/52  bone]
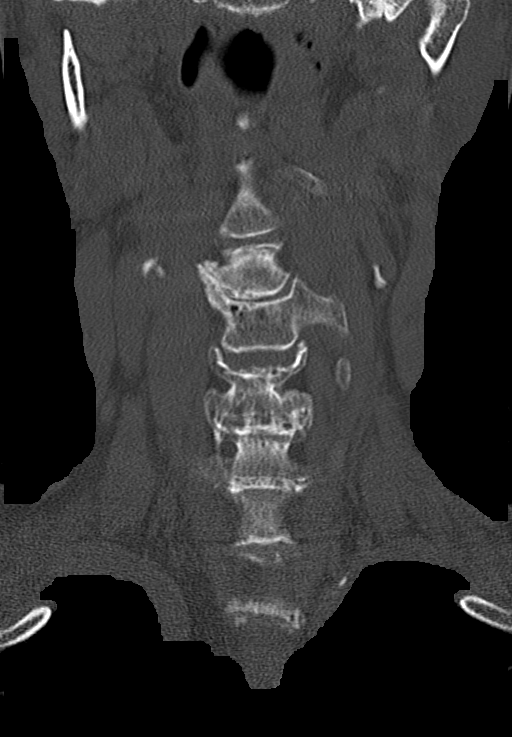
[im 21/52  bone]
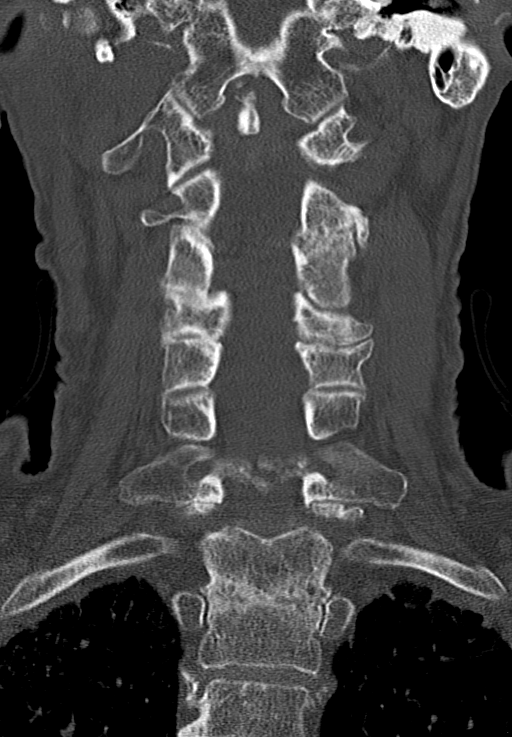
[im 31/52  bone]
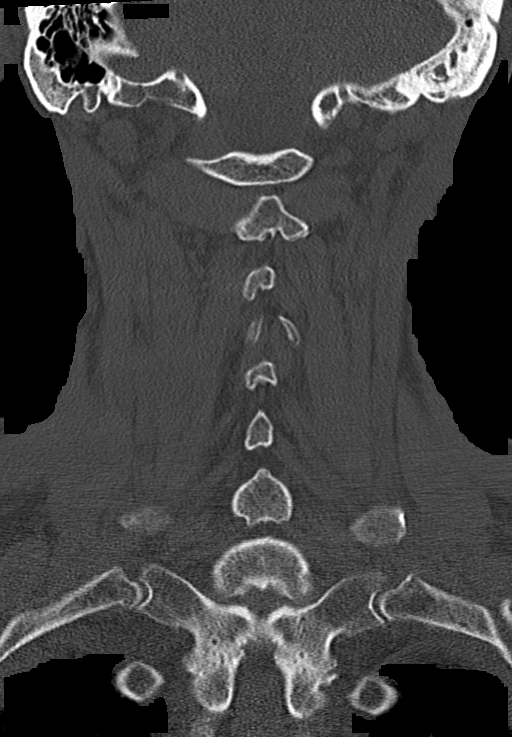

[12 of 33 positions shown; findings below may reference images not displayed]

FINDINGS: Alignment: There is approximately 1 mm retrolisthesis of the C3
vertebral body on C4.

Skull base and vertebrae: No acute fracture. Chronic and
degenerative changes are seen along the tip of the dens and dorsal
aspect of the adjacent anterior arch of C1. No primary bone lesion
or focal pathologic process.

Soft tissues and spinal canal: No prevertebral fluid or swelling. No
visible canal hematoma.

Disc levels: Marked severity endplate sclerosis and mild to moderate
severity anterior osteophyte formation are seen at the levels of
C3-C4, C5-C6 and C6-C7.

There is marked severity narrowing of the anterior atlantoaxial
articulation. Marked severity intervertebral disc space narrowing is
seen at the levels of C3-C4, C5-C6 and C6-C7. Mild to moderate
severity intervertebral disc space narrowing is noted at C2-C3.

Bilateral marked severity multilevel facet joint hypertrophy is
noted.

Upper chest: Biapical scarring and/or atelectasis is seen, right
greater than left.

Other: None.
IMPRESSION: 1. No acute fracture of the cervical spine.
2. Marked severity multilevel degenerative changes, as described
above.

## 2023-10-22 IMAGING — CT CT HEAD W/O CM
3 series · 15 of 47 positions shown, 18 images · non-contrast
Comparison: None.

CLINICAL DATA: Found on the ground at [employer] parking lot.



[Series 3: head wo · axial · 0.47mm/px · z∈[+1435,+1565]mm · 9 of 32 slices shown, 12 images]
[im 3/32  brain]
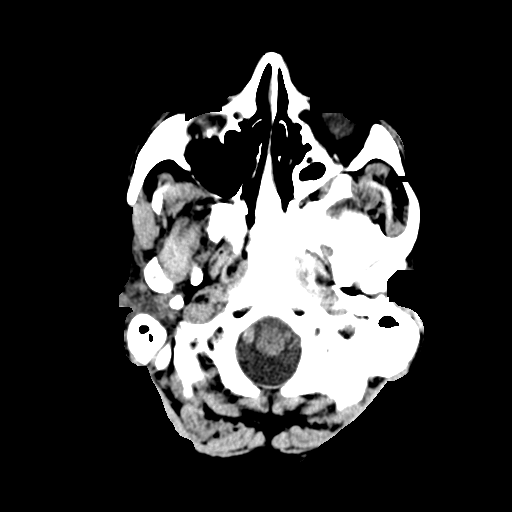
[im 3/32  bone]
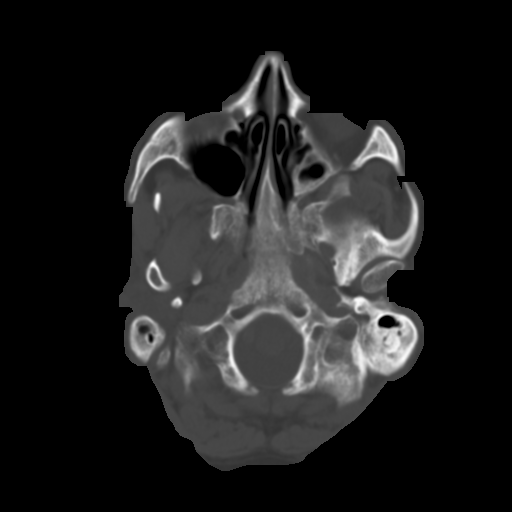
[im 6/32  brain]
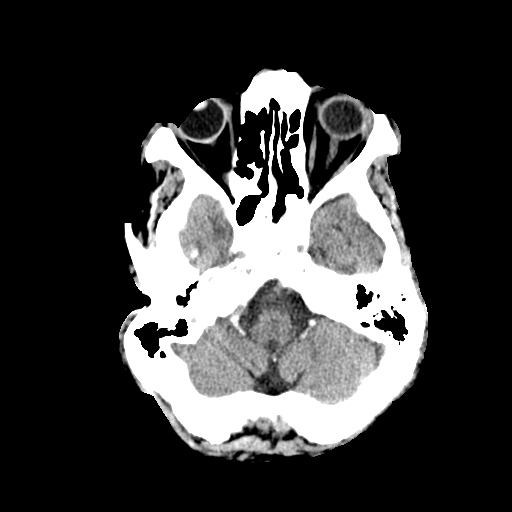
[im 9/32  brain]
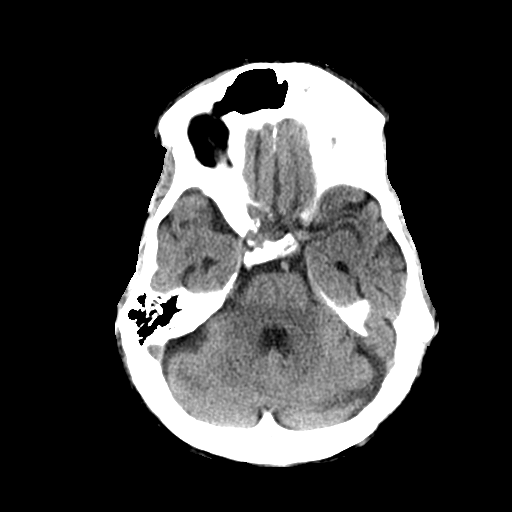
[im 12/32  brain]
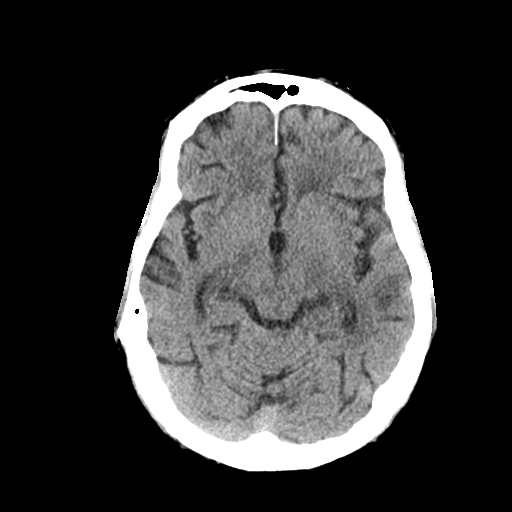
[im 17/32  brain]
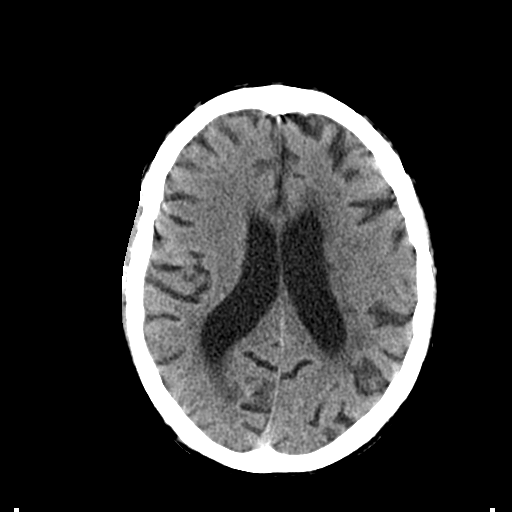
[im 17/32  bone]
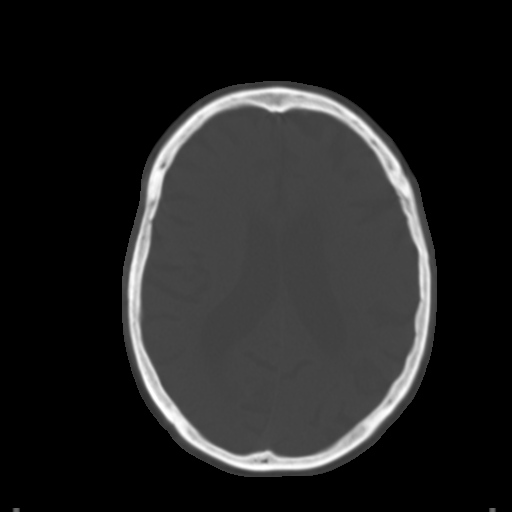
[im 20/32  brain]
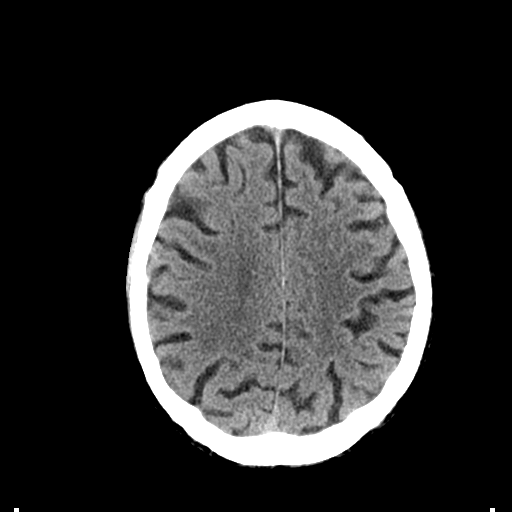
[im 23/32  brain]
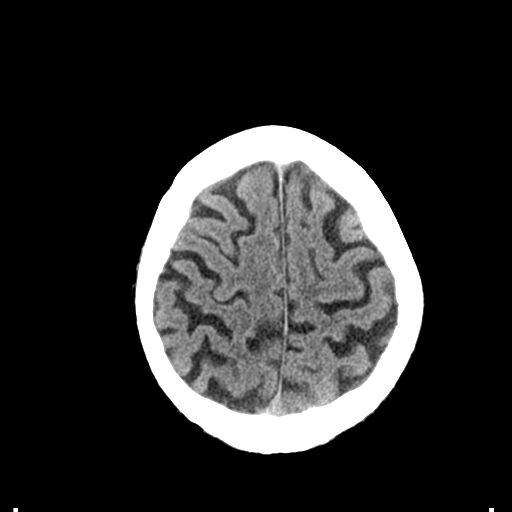
[im 26/32  brain]
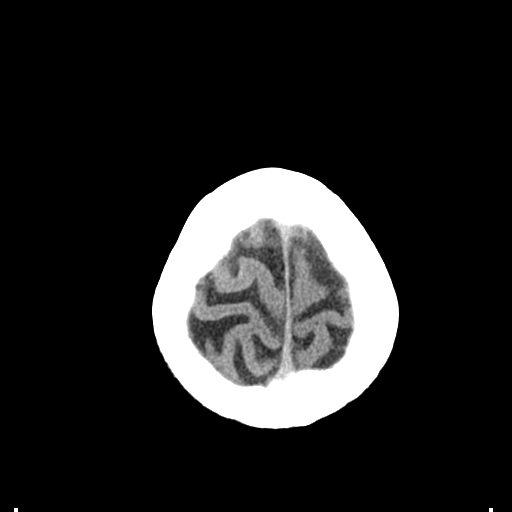
[im 29/32  brain]
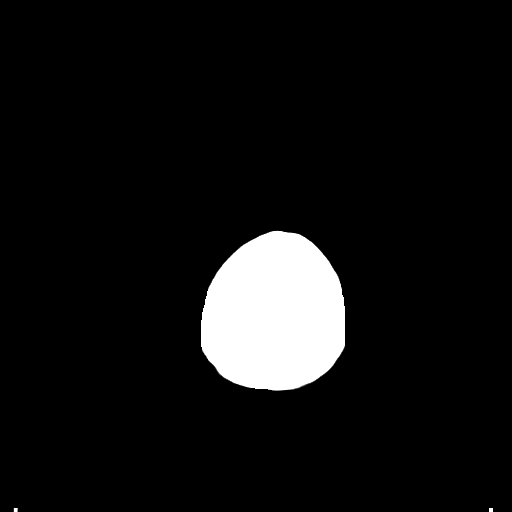
[im 29/32  bone]
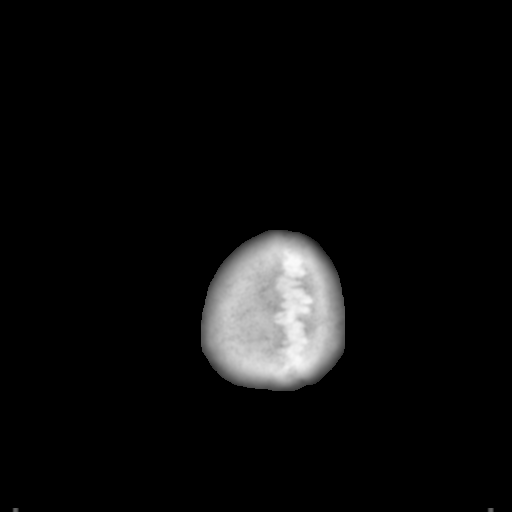

[Series 5: coronal soft tissue · coronal · 0.30mm/px · 3 of 68 slices shown]
[im 23/68  brain]
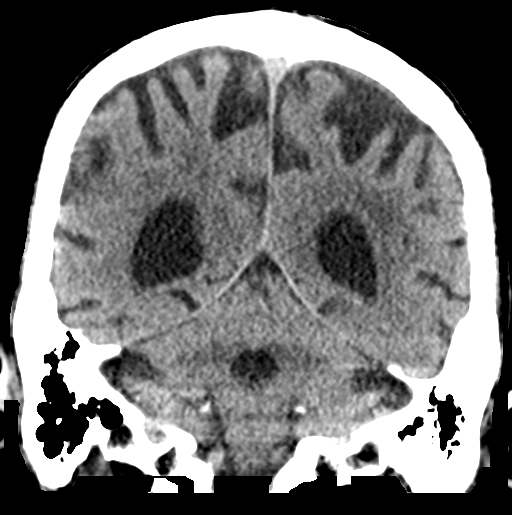
[im 30/68  brain]
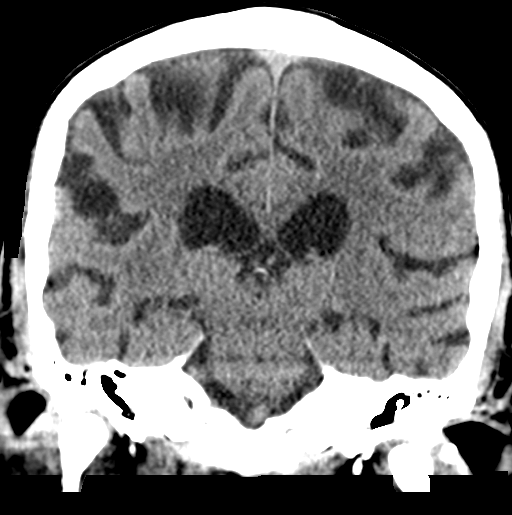
[im 38/68  brain]
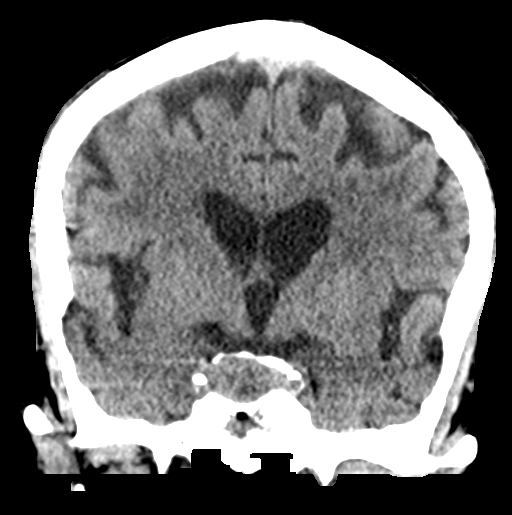

[Series 6: sagittal soft tissue · sagittal · 0.30mm/px · 3 of 52 slices shown]
[im 18/52  brain]
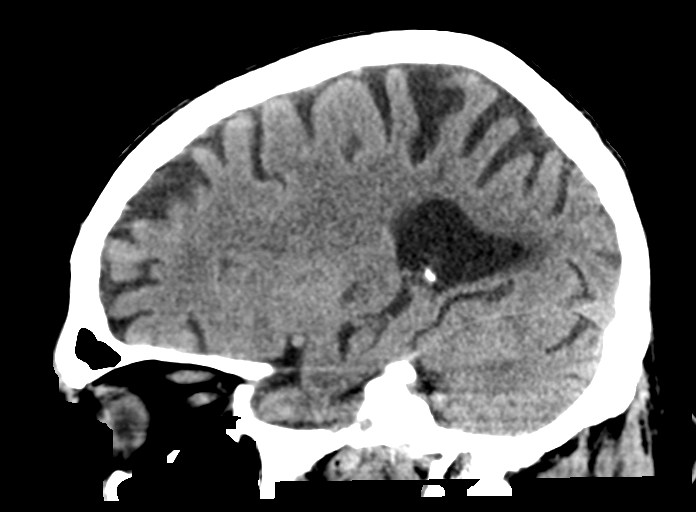
[im 26/52  brain]
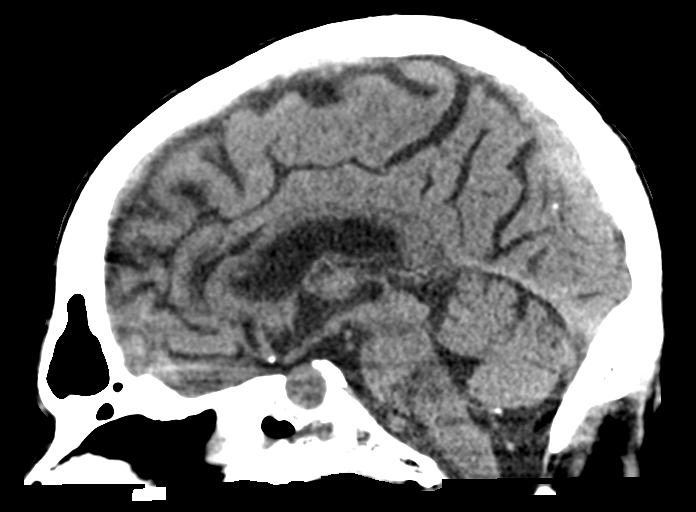
[im 35/52  brain]
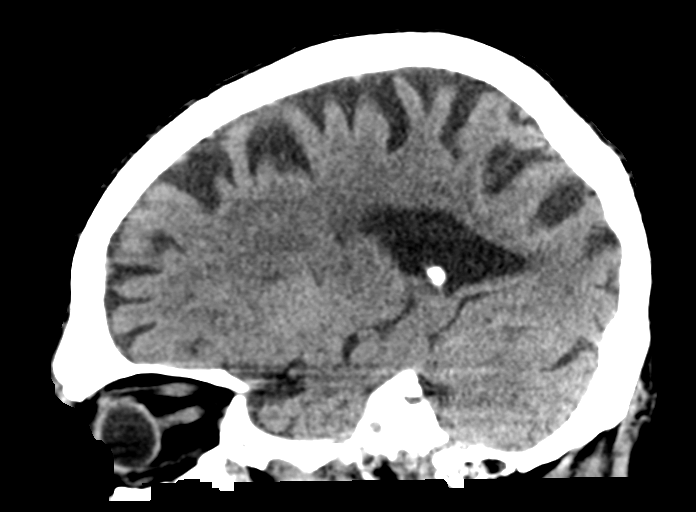

[15 of 47 positions shown; findings below may reference images not displayed]

FINDINGS: Brain: There is mild cerebral atrophy with widening of the
extra-axial spaces and ventricular dilatation.
There are areas of decreased attenuation within the white matter
tracts of the supratentorial brain, consistent with microvascular
disease changes.

Vascular: No hyperdense vessel or unexpected calcification.

Skull: Normal. Negative for fracture or focal lesion.

Sinuses/Orbits: Chronic cortical thickening of the anterior, lateral
and posterior walls of the left maxillary sinus are seen. Similar
appearing changes are noted within the region of the frontal sinus
on the left.

Other: None.
IMPRESSION: 1. No acute intracranial pathology.
2. Chronic left maxillary sinus and frontal sinus disease.
